# Patient Record
Sex: Male | Born: 1960 | Race: Black or African American | Hispanic: No | Marital: Single | Smoking: Current every day smoker
Health system: Southern US, Community
[De-identification: ages and names within clinical notes are randomized; demographics above are authoritative.]

## PROBLEM LIST (undated history)

## (undated) DIAGNOSIS — R197 Diarrhea, unspecified: Secondary | ICD-10-CM

## (undated) DIAGNOSIS — D3613 Benign neoplasm of peripheral nerves and autonomic nervous system of lower limb, including hip: Secondary | ICD-10-CM

## (undated) DIAGNOSIS — M5030 Other cervical disc degeneration, unspecified cervical region: Secondary | ICD-10-CM

## (undated) DIAGNOSIS — G4730 Sleep apnea, unspecified: Secondary | ICD-10-CM

## (undated) DIAGNOSIS — Z9289 Personal history of other medical treatment: Secondary | ICD-10-CM

## (undated) DIAGNOSIS — M722 Plantar fascial fibromatosis: Secondary | ICD-10-CM

## (undated) DIAGNOSIS — I10 Essential (primary) hypertension: Secondary | ICD-10-CM

## (undated) DIAGNOSIS — M1990 Unspecified osteoarthritis, unspecified site: Secondary | ICD-10-CM

## (undated) DIAGNOSIS — J449 Chronic obstructive pulmonary disease, unspecified: Secondary | ICD-10-CM

## (undated) DIAGNOSIS — M7732 Calcaneal spur, left foot: Secondary | ICD-10-CM

## (undated) DIAGNOSIS — I429 Cardiomyopathy, unspecified: Secondary | ICD-10-CM

## (undated) DIAGNOSIS — Z9189 Other specified personal risk factors, not elsewhere classified: Secondary | ICD-10-CM

## (undated) HISTORY — PX: CARDIAC CATHETERIZATION: SHX172

## (undated) HISTORY — PX: HIP SURGERY: SHX245

## (undated) HISTORY — PX: KNEE SURGERY: SHX244

---
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
## 1997-11-16 ENCOUNTER — Emergency Department (HOSPITAL_COMMUNITY): Admission: EM | Admit: 1997-11-16 | Discharge: 1997-11-16 | Payer: Self-pay | Admitting: Emergency Medicine

## 1998-10-12 ENCOUNTER — Emergency Department (HOSPITAL_COMMUNITY): Admission: EM | Admit: 1998-10-12 | Discharge: 1998-10-12 | Payer: Self-pay | Admitting: Emergency Medicine

## 1998-10-12 ENCOUNTER — Encounter: Payer: Self-pay | Admitting: Emergency Medicine

## 2003-02-09 ENCOUNTER — Other Ambulatory Visit: Payer: Self-pay

## 2007-03-27 DIAGNOSIS — I429 Cardiomyopathy, unspecified: Secondary | ICD-10-CM

## 2007-03-27 HISTORY — DX: Cardiomyopathy, unspecified: I42.9

## 2007-12-08 ENCOUNTER — Inpatient Hospital Stay (HOSPITAL_COMMUNITY): Admission: EM | Admit: 2007-12-08 | Discharge: 2007-12-11 | Payer: Self-pay | Admitting: Emergency Medicine

## 2007-12-12 ENCOUNTER — Encounter: Admission: RE | Admit: 2007-12-12 | Discharge: 2007-12-12 | Payer: Self-pay | Admitting: Internal Medicine

## 2010-08-08 NOTE — Cardiovascular Report (Signed)
NAME:  [name], [name]          ACCOUNT NO.:  [id number]   MEDICAL RECORD NO.:  [id number]         PATIENT TYPE:  HREC   LOCATION:                                 FACILITY:   PHYSICIAN:  Thereasa Solo. Little, M.D. DATE OF BIRTH:  [birth year]   DATE OF PROCEDURE:  12/10/2007  DATE OF DISCHARGE:                            CARDIAC CATHETERIZATION   INDICATIONS FOR TEST:  This 50 year old male has complained of chest  discomfort and increasing dyspnea on exertion.  He apparently had a cath  in [place] and was told he may need a stent.   He is brought to the cath lab for definitive evaluation of his coronary  anatomy.   After obtaining informed consent, the patient was prepped and draped in  the usual sterile fashion exposing the right groin.  Following the local  anesthetic with 1% Xylocaine, the Seldinger technique was employed and a  5-French introducer sheath was placed in the right femoral artery.  Left  and right coronary arteriography and ventriculography in the RAO  projection was performed.   MEDICATIONS:  Two mg of IV Versed.   COMPLICATIONS:  None.   TOTAL CONTRAST USED:  Seventy mL.   EQUIPMENTS:  A 5-French Judkins configuration catheters.   RESULTS:  Hemodynamic monitoring:  Central aortic pressure was 116/62.  Left ventricular pressure was 117/5 with no aortic valve gradient at the  time of pullback.  Of note, his heart rate was sinus bradycardia with a  rate of 50.   Ventriculography:  Ventriculography in the RAO projection revealed mild  global hypokinesis.  The inferior wall seemed to be slightly more  hypokinetic than the other wall segments.  His left ventricular end-  diastolic pressure was 11.  There was no mitral regurgitation.  An  ejection fraction was 35-40%.  I could not calculate the ejection  fraction because the server is down; however, it will be calculated at a  later date.   Coronary arteriography:   On fluoroscopy, no calcification was  seen.  1. Left main:  Normal and bifurcated.  2. Circumflex:  The circumflex was free of disease.  It gave rise to 2      OM vessels that were free of disease.  3. LAD:  The LAD extended down to the apex of the heart and the distal      third of the LAD was small in comparison to the other vessels, but      there was no evidence of occlusive disease.  There were 2 diagonals      that came off relatively proximally, and they were also free of      disease.  4. Right coronary artery:  Normal.  There was a moderate PDA and 2      small posterolateral vessels that were free of disease.   CONCLUSION:  1. No evidence of coronary disease.  2. Mild-to-moderate LV dysfunction with an ejection fraction of 35-      40%.   I cannot explain the etiology of the decreased EF.  His only potential  factor would be alcohol intake which I have discussed in  detail with  him.  He will need to be placed on an ACE, and he is already on low-dose  beta blockers (Lopressor 25 mg b.i.d.).  He will need to watch for  worsening bradycardia should the beta blockers be increased, and I did  take the liberty of starting him on lisinopril 5 mg b.i.d.  His blood  pressure is relatively low, but hopefully on a split dose of lisinopril,  he will be able to tolerate this.   I plan to check a BMP in the morning.           ______________________________  Thereasa Solo. Little, M.D.     ABL/MEDQ  D:  12/10/2007  T:  12/10/2007  Job:  [id number]   cc:   Nicki Guadalajara, M.D.  [facility] [employer]

## 2010-08-08 NOTE — Discharge Summary (Signed)
[name], [name]          ACCOUNT NO.:  [id number]   MEDICAL RECORD NO.:  [id number]          PATIENT TYPE:  INP   LOCATION:  3741                         FACILITY:  [facility]   PHYSICIAN:  Hillery Aldo, M.D.   DATE OF BIRTH:  [birth year]   DATE OF ADMISSION:  12/08/2007  DATE OF DISCHARGE:  12/11/2007                               DISCHARGE SUMMARY   PRIMARY CARE PHYSICIAN:  Unassigned.   DISCHARGE DIAGNOSES:  1. Noncardiac chest pain.  2. Cardiomyopathy with an ejection fraction of 35-40%.  3. Left arm paresthesias secondary to cervical spinal stenosis and      degenerative disk disease.  4. Ongoing tobacco abuse.  5. Hypertension.  6. Osteoarthritis.  7. Right breast mass.   DISCHARGE MEDICATIONS:  1. Aspirin 81 mg daily.  2. Lopressor 25 mg b.i.d.  3. Lisinopril 5 mg b.i.d.  4. Vicodin 5/500 one to two tablets q.6 h. p.r.n. pain.   CONSULTATIONS:  Nicki Guadalajara, MD, Cardiology.   BRIEF ADMISSION HISTORY OF PRESENT ILLNESS:  The patient is a 50-year-  old male presented to the hospital with chief complaint of chest pain  and left arm numbness.  For full details, please see the dictated report  done by Dr. Sharon Seller.  Given the patient's risk factor profile, he was  admitted to rule out acute coronary syndrome and for further evaluation  and workup.   PROCEDURES AND DIAGNOSTIC STUDIES:  1. Chest x-ray on December 08, 2007, showed no acute abnormalities.  2. Cervical spine films on December 08, 2007, showed anterior      degenerative changes of C5-6 and C6-7 levels.  No bony canal or      foraminal stenosis noted.  Reversal of the normal cervical lordosis      was noted.  3. CT scan of the head on December 08, 2007, showed normal findings.  4. MRI of the brain on December 09, 2007, showed mild tonsillar      ectopia without definite glycopeptide-resistant enterococcus      malformation.  Small hyperdensities in the white matter      bilaterally, nonspecific, but  most likely chronic ischemia.  No      acute infarct.  5. Cardiac catheterization on December 10, 2007, showed no evidence      of coronary artery disease.  Mild-to-moderate left ventricular      dysfunction with an ejection fraction of 35-40%.  6. MRI of the cervical spine on December 10, 2007, showed      degenerative cervical spondylosis with disk disease and facet      disease.  Multilevel shallow disk protrusion, osteophytic and      uncinate spurring changes contributing to spinal and foraminal      stenosis.   DISCHARGE LABORATORY VALUES:  Sodium was 139, potassium 4.8, chloride  105, bicarb 30, BUN 11, creatinine 1.08, and glucose 89.  White blood  cell count was 5.8, hemoglobin 15.2, hematocrit 45.8, and platelets 216.   HOSPITAL COURSE BY PROBLEM:  1. Chest pain:  The patient was admitted and acute coronary syndrome      was ruled out by serial  enzyme testing.  He was monitored on      telemetry and because of his risk factor profile, a Cardiology      consult was requested and kindly provided by Dr. Tresa Endo.  The      patient did undergo cardiac catheterization with findings as noted      above.  Recommendations were for combination of ACE inhibitor and      beta-blocker therapy for his decreased EF.  The underlying etiology      of his nonischemic cardiomyopathy was not known.  We did recommend      that he discontinue any alcohol intake.  At this time, the patient      is chest pain free and has been cleared for discharge by the      cardiologist.  2. Cardiomyopathy/ejection fraction of 35-40%:  Again, the patient      does have evidence of nonischemic cardiomyopathy of undetermined      etiology.  We have recommended that he discontinue alcohol and      smoking.  He was discharged on the appropriate medications      including beta-blocker therapy and ACE inhibitor.  He has a follow      up appointment scheduled with Dr. Tresa Endo of Cardiology on January 13, 2008, at  3:45 p.m.  3. Left arm paresthesias:  The patient had persistent left arm      paresthesias and was evaluated for the possibility of stroke.  His      CVA was ruled out by MRI scanning.  MRI of the cervical spine      revealed multiple abnormalities of the cervical spine and disk.      This is likely the etiology of his significant paresthesias.  He      will likely need evaluation by a neurosurgeon for definitive      treatment if conservative therapy does not resolve his symptoms.  4. Tobacco abuse:  The patient was extensively counseled on the      importance of cessation.  5. Hypertension:  The patient was slightly hypotensive at discharge.      It was noted that he was put on nitroglycerin paste for his initial      presenting complaints.  This was discontinued and should help to      improve his blood pressure readings.  6. Osteoarthritis:  The patient was maintained on analgesics for pain      control.  7. Right breast mass:  The patient had a palpable mass approximately      1.5 cm lateral to the right nipple.  He was set up for an      outpatient evaluation at the breast center for December 12, 2007,      and we will receive a diagnostic mammogram with ultrasound and      biopsy at that time.   DISPOSITION:  The patient is medically stable for discharge and has  appropriate followup arranged.      Hillery Aldo, M.D.  Electronically Signed     CR/MEDQ  D:  12/11/2007  T:  12/12/2007  Job:  [id number]

## 2010-08-08 NOTE — H&P (Signed)
NAME:  DEMPSEY, AHONEN          ACCOUNT NO.:  [id number]   MEDICAL RECORD NO.:  [id number]          PATIENT TYPE:  EMS   LOCATION:  MAJO                         FACILITY:  [facility]   PHYSICIAN:  Lonia Blood, M.D.DATE OF BIRTH:  1960-11-04   DATE OF ADMISSION:  12/08/2007  DATE OF DISCHARGE:                              HISTORY & PHYSICAL   ADDENDUM:  Please note this is an addendum to the previously dictated  admission history and physical.  My dictation was interrupted midway for  unclear reasons.   DATA REVIEW:  CBC is unremarkable.  C-MET is unremarkable.  Cardiac  enzymes are negative x2.  Chest x-ray reveals no acute disease.  CT scan  of the head reveals no acute disease.  C spine plain film reveals  anterior degenerative change of C5 through C6 and C6-C7 levels but no  evidence of stenosis.  A 12-lead EKG is normal sinus rhythm at 85 beats  per minute and deep Q waves in V1 and V2.   PHYSICAL EXAMINATION:  VITAL SIGNS:  Temperature 98, blood pressure  160/83, heart rate 83, respiratory rate 18, O2 saturation 97% on room  air.  GENERAL:  Well-developed, well-nourished gentleman in no acute  respiratory distress.  HEENT:  Head is normocephalic, atraumatic.  Pupils are equal, round,  reactive to light and accommodation.  Extraocular muscles are intact.  OC/OP clear.  NECK:  No JVD.  LUNGS:  Clear to auscultation bilaterally without wheezes or rhonchi.  CARDIOVASCULAR:  Regular rate and rhythm without murmur, gallop or rub,  normal S1 and S2.  ABDOMEN:  Nontender, nondistended, soft, bowel sounds present, no  hepatosplenomegaly, no rebound, no ascites.  EXTREMITIES:  No significant cyanosis, clubbing or edema in bilateral  lower extremities.  NEUROLOGIC:  The patient displays 5/5 strength in bilateral upper  extremities.  He does require significant coaching, however, to use the  left upper extremity.  He does appear to have diminished sensation in  this hand.  There  is a 1+ radial pulse bilateral wrists.  He is alert  and oriented x4.  Cranial nerves II-XII are intact.  There is no  Babinski.   IMPRESSION/PLAN:  1. Chest pain:  It is unclear to me whether Mr. Kille truly has      a history of coronary disease or not.  He does smoke and has      uncontrolled hypertension.  I would suspect that he has      hyperlipidemia as well.  He does not appear to have a positive      history of coronary artery disease, and there is no evidence of      occult diabetes.  We will admit the patient to rule him out for      acute myocardial infarction.  I am not convinced that he has had an      acute myocardial infarction, but I am more concerned that he may,      in fact, be experiencing true angina.  After the patient has been      ruled out, we will need to consider further risk stratification,  and this patient will likely require cardiac catheterization.  We      will call cardiology when the results of our cardiac enzymes and      serial EKGs are accomplished.  2. Left arm numbness/paresthesias:  This may, in fact, simply      represent a brachial plexus-type injury.  This could also be      related to supraspinatus injury or even rotator cuff issues.      Nonetheless in this patient whom I suspect may have coronary      disease, I also must consider the possibility of cerebrovascular      disease.  CT scan has been accomplished and is unrevealing.  To put      this issue to rest, I am going to go ahead and proceed with an MRI      of the head with contrast.  If the patient has evidence of      cerebrovascular disease, a full stroke evaluation will be carried      out.  For now, the patient will be empirically placed on aspirin      for the dual indication of chest pain as well as possible      cerebrovascular accident.  He does, in fact, follow commands and      appears to be safe for oral intake on my bedside swallow      evaluation.  3. Tobacco  abuse:  I have counseled the patient as to multiple      deleterious effects of ongoing tobacco abuse.  I have advised him      to discontinue smoking immediately.  I will request tobacco      cessation consultation to further encourage this point during his      hospital stay.  4. Hypertension:  The patient has poorly controlled hypertension.  He      reports that he was previously treated with antihypertensives.  In      this current setting, I will add a beta-blocker, and we will follow      the patient's blood pressure trend.      Lonia Blood, M.D.  Electronically Signed     JTM/MEDQ  D:  12/08/2007  T:  12/08/2007  Job:  [id number]

## 2010-08-08 NOTE — H&P (Addendum)
[name], [name]          ACCOUNT NO.:  [id number]   MEDICAL RECORD NO.:  [id number]          PATIENT TYPE:  EMS   LOCATION:  MAJO                         FACILITY:  [facility]   PHYSICIAN:  Lonia Blood, M.D.DATE OF BIRTH:  29-Jul-1960   DATE OF ADMISSION:  12/08/2007  DATE OF DISCHARGE:                              HISTORY & PHYSICAL   PRIMARY CARE PHYSICIAN:  Unassigned.   CHIEF COMPLAINT:  Chest pain and left arm numbness.   HISTORY OF PRESENT ILLNESS:  Mr. [name] is a very pleasant 50-  year-old gentleman.  He provides somewhat of a circumferential history  and therefore specific details of his history are very difficult to  ascertain.  Nonetheless, the general consensus here is that the patient  has been suffering with substernal and left-sided upper chest type  pressure-type pain for at least 2 weeks now.  Indeed, at one point  during the conversation he suggested that these symptoms may go back  more than 5 years.  He interestingly enough states that he did have an  evaluation for coronary disease like 10 years ago.  He states that he  was told at that point that he did actually have coronary disease and  that stents were recommended.  The patient then apparently refused to  have these stents and left the hospital.  He has not had any cardiac  evaluation since that time.  The patient states that his pain has been  certainly worsening over the last 2 months.  It appears to be worse when  he physically exerts himself.  At times it is associated with  diaphoresis.  The reason that he actually came in today however, is that  it has also become associated with severe left arm numbness and  tingling.  This has been almost constant now for apparently 48 hours or  greater.  The patient denies loss of strength in the arm, but states  there are times thatI just cannot even feel my arm.  He also states               there are times where the arm is tingling  significantly.  He denies loss  of strength in other extremities, clumsiness, falls, change in facies,  slurred speech or difficulty swallowing.  There has been no amneurosis  fugax.  The patient does not have a regular doctor and does not receive  regular medical care.   PAST MEDICAL HISTORY:  1. Reported history of known coronary artery disease with patient      refusing PTCA/stents approximately 5-10 years ago by his report in      [name], [name].  2. Tobacco abuse in the amount of 1 pack per day beginning at age of      9.  3. Severe osteoarthritis of bilateral knees and hips status post      multiple surgical corrections to the knees and the hips per his      report.   OUTPATIENT MEDICATIONS:  None.   ALLERGIES:  No known drug allergies.   FAMILY HISTORY:  The patient's mother died of cancer from the bones.  The patient's father died of an MI but was 58 at the time that he died  of such and had no early coronary disease as best the patient knows.   SOCIAL HISTORY:  The patient is employed as a Education administrator.  He lives in  [place].  He occasionally partakes of alcohol.  He has healthy  children.  He is divorced.   DATA REVIEW:  CBC is unremarkable.  CMET is unremarkable.   Dictation ended at this point.      Lonia Blood, M.D.  Electronically Signed     JTM/MEDQ  D:  12/08/2007  T:  12/08/2007  Job:  [id number]

## 2010-12-25 LAB — BASIC METABOLIC PANEL
BUN: 11
CO2: 28
Calcium: 9.2
Chloride: 104
Chloride: 105
Creatinine, Ser: 0.89
Creatinine, Ser: 1.08
Glucose, Bld: 85
Glucose, Bld: 89
Potassium: 4.8
Sodium: 138

## 2010-12-25 LAB — LIPID PANEL
HDL: 53
Total CHOL/HDL Ratio: 2.8
Triglycerides: 85

## 2010-12-25 LAB — CBC
Hemoglobin: 15.2
MCHC: 33.1
MCV: 91.4
RBC: 5.01
RDW: 15

## 2010-12-25 LAB — PROTIME-INR: INR: 0.9

## 2010-12-25 LAB — CARDIAC PANEL(CRET KIN+CKTOT+MB+TROPI)
Total CK: 142
Troponin I: 0.01
Troponin I: <0.01

## 2010-12-27 LAB — COMPREHENSIVE METABOLIC PANEL
AST: 23
Albumin: 4.1
BUN: 9
Calcium: 9.1
Chloride: 107
Creatinine, Ser: 0.76
GFR calc Af Amer: >60
Total Bilirubin: 1
Total Protein: 6.8

## 2010-12-27 LAB — POCT I-STAT, CHEM 8
BUN: 10
Calcium, Ion: 1.16
Chloride: 108
Creatinine, Ser: 0.9
TCO2: 26

## 2010-12-27 LAB — PROTIME-INR: Prothrombin Time: 12.8

## 2010-12-27 LAB — DIFFERENTIAL
Basophils Absolute: 0
Eosinophils Relative: 2
Lymphocytes Relative: 36
Lymphs Abs: 2
Monocytes Absolute: 0.5
Monocytes Relative: 8
Neutro Abs: 2.9

## 2010-12-27 LAB — POCT CARDIAC MARKERS
CKMB, poc: <1 — ABNORMAL LOW
Myoglobin, poc: 49.9
Myoglobin, poc: 57.7
Troponin i, poc: <0.05

## 2010-12-27 LAB — TSH: TSH: 2.385

## 2010-12-27 LAB — CBC
HCT: 46.3
MCHC: 34.3
MCV: 88.9
Platelets: 246
RDW: 15.1
WBC: 5.5

## 2010-12-27 LAB — APTT: aPTT: 35

## 2010-12-27 LAB — HEMOGLOBIN A1C: Mean Plasma Glucose: 105

## 2010-12-27 LAB — FOLATE: Folate: 17.9

## 2010-12-27 LAB — TROPONIN I: Troponin I: <0.01

## 2010-12-27 LAB — CK TOTAL AND CKMB (NOT AT ARMC): Total CK: 174

## 2013-03-26 HISTORY — PX: ACHILLES TENDON REPAIR: SUR1153

## 2013-04-14 ENCOUNTER — Emergency Department (HOSPITAL_COMMUNITY): Payer: Self-pay

## 2013-04-14 ENCOUNTER — Inpatient Hospital Stay (HOSPITAL_COMMUNITY)
Admission: EM | Admit: 2013-04-14 | Discharge: 2013-04-16 | DRG: 071 | Disposition: A | Discharge status: Home / Self Care | Payer: Self-pay | Attending: Internal Medicine | Admitting: Internal Medicine

## 2013-04-14 ENCOUNTER — Encounter (HOSPITAL_COMMUNITY): Payer: Self-pay | Admitting: Emergency Medicine

## 2013-04-14 DIAGNOSIS — I10 Essential (primary) hypertension: Secondary | ICD-10-CM | POA: Diagnosis present

## 2013-04-14 DIAGNOSIS — G9340 Encephalopathy, unspecified: Principal | ICD-10-CM | POA: Diagnosis present

## 2013-04-14 DIAGNOSIS — F17210 Nicotine dependence, cigarettes, uncomplicated: Secondary | ICD-10-CM | POA: Diagnosis present

## 2013-04-14 DIAGNOSIS — R4182 Altered mental status, unspecified: Secondary | ICD-10-CM | POA: Diagnosis present

## 2013-04-14 DIAGNOSIS — Z8249 Family history of ischemic heart disease and other diseases of the circulatory system: Secondary | ICD-10-CM

## 2013-04-14 DIAGNOSIS — R479 Unspecified speech disturbances: Secondary | ICD-10-CM | POA: Diagnosis present

## 2013-04-14 DIAGNOSIS — Z91199 Patient's noncompliance with other medical treatment and regimen due to unspecified reason: Secondary | ICD-10-CM

## 2013-04-14 DIAGNOSIS — Z9119 Patient's noncompliance with other medical treatment and regimen: Secondary | ICD-10-CM

## 2013-04-14 DIAGNOSIS — R51 Headache: Secondary | ICD-10-CM | POA: Diagnosis present

## 2013-04-14 DIAGNOSIS — I428 Other cardiomyopathies: Secondary | ICD-10-CM | POA: Diagnosis present

## 2013-04-14 DIAGNOSIS — Z808 Family history of malignant neoplasm of other organs or systems: Secondary | ICD-10-CM

## 2013-04-14 DIAGNOSIS — F17200 Nicotine dependence, unspecified, uncomplicated: Secondary | ICD-10-CM | POA: Diagnosis present

## 2013-04-14 DIAGNOSIS — R519 Headache, unspecified: Secondary | ICD-10-CM | POA: Diagnosis present

## 2013-04-14 DIAGNOSIS — Z720 Tobacco use: Secondary | ICD-10-CM

## 2013-04-14 DIAGNOSIS — I252 Old myocardial infarction: Secondary | ICD-10-CM

## 2013-04-14 HISTORY — DX: Essential (primary) hypertension: I10

## 2013-04-14 LAB — DIFFERENTIAL
Basophils Absolute: 0 10*3/uL (ref 0.0–0.1)
Basophils Relative: 0 % (ref 0–1)
EOS ABS: 0.1 10*3/uL (ref 0.0–0.7)
Eosinophils Relative: 2 % (ref 0–5)
LYMPHS ABS: 3.5 10*3/uL (ref 0.7–4.0)
LYMPHS PCT: 46 % (ref 12–46)
MONOS PCT: 8 % (ref 3–12)
Monocytes Absolute: 0.6 10*3/uL (ref 0.1–1.0)
NEUTROS ABS: 3.3 10*3/uL (ref 1.7–7.7)
NEUTROS PCT: 44 % (ref 43–77)

## 2013-04-14 LAB — CBC
HCT: 44.2 % (ref 39.0–52.0)
HEMOGLOBIN: 15.4 g/dL (ref 13.0–17.0)
MCH: 31 pg (ref 26.0–34.0)
MCHC: 34.8 g/dL (ref 30.0–36.0)
MCV: 88.9 fL (ref 78.0–100.0)
PLATELETS: 210 10*3/uL (ref 150–400)
RBC: 4.97 MIL/uL (ref 4.22–5.81)
RDW: 14.3 % (ref 11.5–15.5)
WBC: 7.5 10*3/uL (ref 4.0–10.5)

## 2013-04-14 LAB — POCT I-STAT TROPONIN I: Troponin i, poc: 0.01 ng/mL (ref 0.00–0.08)

## 2013-04-14 LAB — GLUCOSE, CAPILLARY: GLUCOSE-CAPILLARY: 90 mg/dL (ref 70–99)

## 2013-04-14 NOTE — ED Notes (Addendum)
Presents with onset of right sided headache, confusion and inability to follow commands, bilateral wheezes. Began at 4 pm today. Family called EMS due to confusion. Pt with biliateral expiratory wheezes, confusion. Slow to respond.

## 2013-04-15 ENCOUNTER — Encounter (HOSPITAL_COMMUNITY): Payer: Self-pay | Admitting: Internal Medicine

## 2013-04-15 ENCOUNTER — Inpatient Hospital Stay (HOSPITAL_COMMUNITY): Payer: Self-pay

## 2013-04-15 DIAGNOSIS — R519 Headache, unspecified: Secondary | ICD-10-CM | POA: Diagnosis present

## 2013-04-15 DIAGNOSIS — R4182 Altered mental status, unspecified: Secondary | ICD-10-CM | POA: Insufficient documentation

## 2013-04-15 DIAGNOSIS — I10 Essential (primary) hypertension: Secondary | ICD-10-CM | POA: Diagnosis present

## 2013-04-15 DIAGNOSIS — R4789 Other speech disturbances: Secondary | ICD-10-CM

## 2013-04-15 DIAGNOSIS — F17210 Nicotine dependence, cigarettes, uncomplicated: Secondary | ICD-10-CM | POA: Diagnosis present

## 2013-04-15 DIAGNOSIS — G9340 Encephalopathy, unspecified: Secondary | ICD-10-CM | POA: Diagnosis present

## 2013-04-15 DIAGNOSIS — I517 Cardiomegaly: Secondary | ICD-10-CM

## 2013-04-15 DIAGNOSIS — R51 Headache: Secondary | ICD-10-CM | POA: Diagnosis present

## 2013-04-15 DIAGNOSIS — R479 Unspecified speech disturbances: Secondary | ICD-10-CM | POA: Diagnosis present

## 2013-04-15 LAB — CBC WITH DIFFERENTIAL/PLATELET
BASOS ABS: 0 10*3/uL (ref 0.0–0.1)
Basophils Relative: 1 % (ref 0–1)
EOS ABS: 0.2 10*3/uL (ref 0.0–0.7)
Eosinophils Relative: 4 % (ref 0–5)
HCT: 45.1 % (ref 39.0–52.0)
Hemoglobin: 14.9 g/dL (ref 13.0–17.0)
Lymphocytes Relative: 43 % (ref 12–46)
Lymphs Abs: 2.3 10*3/uL (ref 0.7–4.0)
MCH: 29.8 pg (ref 26.0–34.0)
MCHC: 33 g/dL (ref 30.0–36.0)
MCV: 90.2 fL (ref 78.0–100.0)
Monocytes Absolute: 0.5 10*3/uL (ref 0.1–1.0)
Monocytes Relative: 9 % (ref 3–12)
NEUTROS PCT: 44 % (ref 43–77)
Neutro Abs: 2.4 10*3/uL (ref 1.7–7.7)
PLATELETS: 201 10*3/uL (ref 150–400)
RBC: 5 MIL/uL (ref 4.22–5.81)
RDW: 14.5 % (ref 11.5–15.5)
WBC: 5.5 10*3/uL (ref 4.0–10.5)

## 2013-04-15 LAB — COMPREHENSIVE METABOLIC PANEL
ALK PHOS: 69 U/L (ref 39–117)
ALT: 17 U/L (ref 0–53)
ALT: 18 U/L (ref 0–53)
AST: 15 U/L (ref 0–37)
AST: 15 U/L (ref 0–37)
Albumin: 3.5 g/dL (ref 3.5–5.2)
Albumin: 3.8 g/dL (ref 3.5–5.2)
Alkaline Phosphatase: 61 U/L (ref 39–117)
BILIRUBIN TOTAL: 1.1 mg/dL (ref 0.3–1.2)
BILIRUBIN TOTAL: 0.8 mg/dL (ref 0.3–1.2)
BUN: 9 mg/dL (ref 6–23)
BUN: 8 mg/dL (ref 6–23)
CHLORIDE: 105 meq/L (ref 96–112)
CHLORIDE: 106 meq/L (ref 96–112)
CO2: 21 mEq/L (ref 19–32)
CO2: 24 meq/L (ref 19–32)
Calcium: 8.5 mg/dL (ref 8.4–10.5)
Calcium: 9 mg/dL (ref 8.4–10.5)
Creatinine, Ser: 0.86 mg/dL (ref 0.50–1.35)
Creatinine, Ser: 0.87 mg/dL (ref 0.50–1.35)
GFR calc non Af Amer: >90 mL/min (ref >90)
GLUCOSE: 106 mg/dL — AB (ref 70–99)
GLUCOSE: 84 mg/dL (ref 70–99)
POTASSIUM: 3.9 meq/L (ref 3.7–5.3)
Potassium: 4.3 mEq/L (ref 3.7–5.3)
SODIUM: 142 meq/L (ref 137–147)
Sodium: 142 mEq/L (ref 137–147)
TOTAL PROTEIN: 7 g/dL (ref 6.0–8.3)
Total Protein: 6.5 g/dL (ref 6.0–8.3)

## 2013-04-15 LAB — RAPID URINE DRUG SCREEN, HOSP PERFORMED
AMPHETAMINES: NOT DETECTED
BARBITURATES: POSITIVE — AB
BENZODIAZEPINES: NOT DETECTED
Cocaine: NOT DETECTED
OPIATES: POSITIVE — AB
TETRAHYDROCANNABINOL: NOT DETECTED

## 2013-04-15 LAB — HEMOGLOBIN A1C
Hgb A1c MFr Bld: 5.6 % (ref <5.7)
Mean Plasma Glucose: 114 mg/dL (ref <117)

## 2013-04-15 LAB — PRO B NATRIURETIC PEPTIDE: Pro B Natriuretic peptide (BNP): 11.1 pg/mL (ref 0–125)

## 2013-04-15 LAB — PROTIME-INR
INR: 0.92 (ref 0.00–1.49)
PROTHROMBIN TIME: 12.2 s (ref 11.6–15.2)

## 2013-04-15 LAB — TSH: TSH: 1.767 u[IU]/mL (ref 0.350–4.500)

## 2013-04-15 LAB — TROPONIN I: Troponin I: <0.3 ng/mL (ref <0.30)

## 2013-04-15 LAB — SEDIMENTATION RATE: SED RATE: 6 mm/h (ref 0–16)

## 2013-04-15 MED ORDER — ASPIRIN EC 325 MG PO TBEC
325.0000 mg | DELAYED_RELEASE_TABLET | Freq: Every day | ORAL | Status: DC
  Administered 2013-04-15 – 2013-04-16 (×2): 325 mg via ORAL
  Filled 2013-04-15 (×2): qty 1

## 2013-04-15 MED ORDER — ACETAMINOPHEN 650 MG RE SUPP: 650.0000 mg | RECTAL | Status: DC | PRN

## 2013-04-15 MED ORDER — SODIUM CHLORIDE 0.9 % IV SOLN
INTRAVENOUS | Status: AC
  Administered 2013-04-15: 19:00:00 via INTRAVENOUS

## 2013-04-15 MED ORDER — GADOBENATE DIMEGLUMINE 529 MG/ML IV SOLN
20.0000 mL | Freq: Once | INTRAVENOUS | Status: AC
  Administered 2013-04-15: 20 mL via INTRAVENOUS

## 2013-04-15 MED ORDER — KETOROLAC TROMETHAMINE 30 MG/ML IJ SOLN
30.0000 mg | Freq: Once | INTRAMUSCULAR | Status: AC
  Administered 2013-04-15: 30 mg via INTRAVENOUS
  Filled 2013-04-15: qty 1

## 2013-04-15 MED ORDER — IPRATROPIUM-ALBUTEROL 0.5-2.5 (3) MG/3ML IN SOLN
6.0000 mL | Freq: Once | RESPIRATORY_TRACT | Status: AC
  Administered 2013-04-15: 6 mL via RESPIRATORY_TRACT
  Filled 2013-04-15: qty 6

## 2013-04-15 MED ORDER — ALBUTEROL SULFATE (2.5 MG/3ML) 0.083% IN NEBU: 2.5000 mg | INHALATION_SOLUTION | RESPIRATORY_TRACT | Status: DC | PRN

## 2013-04-15 MED ORDER — MORPHINE SULFATE 2 MG/ML IJ SOLN
1.0000 mg | INTRAMUSCULAR | Status: DC | PRN
  Administered 2013-04-15 (×3): 1 mg via INTRAVENOUS
  Filled 2013-04-15 (×3): qty 1

## 2013-04-15 MED ORDER — VITAMIN B-1 100 MG PO TABS
100.0000 mg | ORAL_TABLET | Freq: Every day | ORAL | Status: DC
  Administered 2013-04-15 – 2013-04-16 (×2): 100 mg via ORAL
  Filled 2013-04-15 (×2): qty 1

## 2013-04-15 MED ORDER — SODIUM CHLORIDE 0.9 % IV SOLN
INTRAVENOUS | Status: AC
  Administered 2013-04-15: 02:00:00 via INTRAVENOUS

## 2013-04-15 MED ORDER — ONDANSETRON HCL 4 MG/2ML IJ SOLN: 4.0000 mg | Freq: Three times a day (TID) | INTRAMUSCULAR | Status: DC | PRN

## 2013-04-15 MED ORDER — METOCLOPRAMIDE HCL 5 MG/ML IJ SOLN
10.0000 mg | Freq: Once | INTRAMUSCULAR | Status: AC
  Administered 2013-04-15: 10 mg via INTRAVENOUS
  Filled 2013-04-15: qty 2

## 2013-04-15 MED ORDER — SENNOSIDES-DOCUSATE SODIUM 8.6-50 MG PO TABS: 1.0000 | ORAL_TABLET | Freq: Every evening | ORAL | Status: DC | PRN

## 2013-04-15 MED ORDER — ACETAMINOPHEN 325 MG PO TABS
650.0000 mg | ORAL_TABLET | ORAL | Status: DC | PRN
  Administered 2013-04-15: 650 mg via ORAL
  Filled 2013-04-15: qty 2

## 2013-04-15 MED ORDER — DIPHENHYDRAMINE HCL 50 MG/ML IJ SOLN
25.0000 mg | Freq: Once | INTRAMUSCULAR | Status: AC
  Administered 2013-04-15: 25 mg via INTRAVENOUS
  Filled 2013-04-15: qty 1

## 2013-04-15 NOTE — Progress Notes (Signed)
*  PRELIMINARY RESULTS* Vascular Ultrasound Carotid Duplex (Doppler) has been completed.  Preliminary findings: Bilateral:  1-39% ICA stenosis.  Vertebral artery flow is antegrade.     Landry Mellow, RDMS, RVT  04/15/2013, 11:18 AM

## 2013-04-15 NOTE — Progress Notes (Signed)
Patient admitted early this AM by Dr. Raliegh Ip- please see H&P.  Await MRI/MRA  Eulogio Bear

## 2013-04-15 NOTE — Progress Notes (Signed)
  Echocardiogram 2D Echocardiogram has been performed.  [name] 04/15/2013, 10:53 AM

## 2013-04-15 NOTE — Consult Note (Signed)
NEURO HOSPITALIST CONSULT NOTE    Reason for Consult: HA, confusion.  HPI:                                                                                                                                          [name] is an 53 y.o. male, right handed, with uncontrolled HTN, non compliant with meds, MI hx per report, presents with AMS and HA. He said that the usually doesn't get HA but 3 or 4 weeks ago he started experiencing off and on HA that has been present ever since and got worse yesterday. [name] said that he was evaluated in the ED in [place] because he had a severe HA that made him to passed out. It looks like at that time he signed AMA and went home. He indicated that the HA is very intense, sharp, localizes mainly in the right side, associated with nausea and vomiting x 1 few days ago, but no focal weakness-numbness, slurred speech, language or vision impairment. Takes goody powers without HA relief. Today, he was reportedly confused and wife called EMS. Denies fever, infection, head or neck trauma. CT brain performed today showed " patchy at least 1.5 cm left cerebellar hypodensity. In addition, subtle hypodensity within the left corpus callosum".  Past Medical History  Diagnosis Date  . Hypertension     Past Surgical History  Procedure Laterality Date  . Hip surgery    . Knee surgery    . Cardiac catheterization      Family History  Problem Relation Age of Onset  . CAD Father   . Bone cancer Mother     Social History:  reports that he has been smoking.  He has never used smokeless tobacco. He reports that he drinks alcohol. He reports that he does not use illicit drugs.  No Known Allergies  MEDICATIONS:                                                                                                                     I have reviewed the patient's current medications.   ROS:  History obtained from the patient and chart review.  General ROS: negative for - chills, fatigue, fever, night sweats, weight gain or weight loss Psychological ROS: negative for - behavioral disorder, hallucinations, memory difficulties, mood swings or suicidal ideation Ophthalmic ROS: negative for - blurry vision, double vision, eye pain or loss of vision ENT ROS: negative for - epistaxis, nasal discharge, oral lesions, sore throat, tinnitus or vertigo Allergy and Immunology ROS: negative for - hives or itchy/watery eyes Hematological and Lymphatic ROS: negative for - bleeding problems, bruising or swollen lymph nodes Endocrine ROS: negative for - galactorrhea, hair pattern changes, polydipsia/polyuria or temperature intolerance Respiratory ROS: negative for - cough, hemoptysis, shortness of breath or wheezing Cardiovascular ROS: negative for - chest pain, dyspnea on exertion, edema or irregular heartbeat Gastrointestinal ROS: negative for - abdominal pain, diarrhea, hematemesis, or stool incontinence Genito-Urinary ROS: negative for - dysuria, hematuria, incontinence or urinary frequency/urgency Musculoskeletal ROS: negative for - joint swelling or muscular weakness Neurological ROS: as noted in HPI Dermatological ROS: negative for rash and skin lesion changes  Physical exam: pleasant male in no apparent distress. Blood pressure 141/84, pulse 61, temperature 97.9 F (36.6 C), temperature source Oral, resp. rate 18, height 6' 2"  (1.88 m), weight 120.3 kg (265 lb 3.4 oz), SpO2 99.00%. Head: normocephalic. Neck: supple, no bruits, no JVD. Cardiac: no murmurs. Lungs: clear. Abdomen: soft, no tender, no mass. Extremities: no edema. CV: pulses palpable throughout  Neurologic Examination:                                                                                                      Mental  Status: Alert, oriented, thought content appropriate.  Speech fluent without evidence of aphasia.  Able to follow 3 step commands without difficulty. Cranial Nerves: II: Discs flat bilaterally; Visual fields grossly normal, pupils equal, round, reactive to light and accommodation III,IV, VI: ptosis not present, extra-ocular motions intact bilaterally V,VII: smile symmetric, facial light touch sensation normal bilaterally VIII: hearing normal bilaterally IX,X: gag reflex present XI: bilateral shoulder shrug XII: midline tongue extension without atrophy or fasciculations Motor: Right : Upper extremity   5/5    Left:     Upper extremity   5/5  Lower extremity   5/5     Lower extremity   5/5 Tone and bulk:normal tone throughout; no atrophy noted Sensory: Pinprick and light touch intact throughout, bilaterally Deep Tendon Reflexes:  Right: Upper Extremity   Left: Upper extremity   biceps (C-5 to C-6) 2/4   biceps (C-5 to C-6) 2/4 tricep (C7) 2/4    triceps (C7) 2/4 Brachioradialis (C6) 2/4  Brachioradialis (C6) 2/4  Lower Extremity Lower Extremity  quadriceps (L-2 to L-4) 2/4   quadriceps (L-2 to L-4) 2/4 Achilles (S1) 2/4   Achilles (S1) 2/4  Plantars: Right: downgoing   Left: downgoing Cerebellar: normal finger-to-nose,  normal heel-to-shin test Gait:  No tested. No meningeal signs.    Lab Results  Component Value Date/Time   CHOL  Value: 146        ATP III CLASSIFICATION:  <200  mg/dL   Desirable  200-239  mg/dL   Borderline High  >=240    mg/dL   High 12/09/2007  4:23 AM    Results for orders placed during the hospital encounter of 04/14/13 (from the past 48 hour(s))  PROTIME-INR     Status: None   Collection Time    04/14/13 11:42 PM      Result Value Range   Prothrombin Time 12.2  11.6 - 15.2 seconds   INR 0.92  0.00 - 1.49  CBC     Status: None   Collection Time    04/14/13 11:42 PM      Result Value Range   WBC 7.5  4.0 - 10.5 K/uL   RBC 4.97  4.22 - 5.81  MIL/uL   Hemoglobin 15.4  13.0 - 17.0 g/dL   HCT 44.2  39.0 - 52.0 %   MCV 88.9  78.0 - 100.0 fL   MCH 31.0  26.0 - 34.0 pg   MCHC 34.8  30.0 - 36.0 g/dL   RDW 14.3  11.5 - 15.5 %   Platelets 210  150 - 400 K/uL  DIFFERENTIAL     Status: None   Collection Time    04/14/13 11:42 PM      Result Value Range   Neutrophils Relative % 44  43 - 77 %   Neutro Abs 3.3  1.7 - 7.7 K/uL   Lymphocytes Relative 46  12 - 46 %   Lymphs Abs 3.5  0.7 - 4.0 K/uL   Monocytes Relative 8  3 - 12 %   Monocytes Absolute 0.6  0.1 - 1.0 K/uL   Eosinophils Relative 2  0 - 5 %   Eosinophils Absolute 0.1  0.0 - 0.7 K/uL   Basophils Relative 0  0 - 1 %   Basophils Absolute 0.0  0.0 - 0.1 K/uL  COMPREHENSIVE METABOLIC PANEL     Status: None   Collection Time    04/14/13 11:42 PM      Result Value Range   Sodium 142  137 - 147 mEq/L   Potassium 3.9  3.7 - 5.3 mEq/L   Chloride 106  96 - 112 mEq/L   CO2 21  19 - 32 mEq/L   Glucose, Bld 84  70 - 99 mg/dL   BUN 8  6 - 23 mg/dL   Creatinine, Ser 0.86  0.50 - 1.35 mg/dL   Calcium 9.0  8.4 - 10.5 mg/dL   Total Protein 7.0  6.0 - 8.3 g/dL   Albumin 3.8  3.5 - 5.2 g/dL   AST 15  0 - 37 U/L   ALT 18  0 - 53 U/L   Alkaline Phosphatase 69  39 - 117 U/L   Total Bilirubin 0.8  0.3 - 1.2 mg/dL   GFR calc non Af Amer >90  >90 mL/min   GFR calc Af Amer >90  >90 mL/min   Comment: (NOTE)     The eGFR has been calculated using the CKD EPI equation.     This calculation has not been validated in all clinical situations.     eGFR's persistently <90 mL/min signify possible Chronic Kidney     Disease.  TROPONIN I     Status: None   Collection Time    04/14/13 11:42 PM      Result Value Range   Troponin I <0.30  <0.30 ng/mL   Comment:  Due to the release kinetics of cTnI,     a negative result within the first hours     of the onset of symptoms does not rule out     myocardial infarction with certainty.     If myocardial infarction is still suspected,      repeat the test at appropriate intervals.  POCT I-STAT TROPONIN I     Status: None   Collection Time    04/14/13 11:45 PM      Result Value Range   Troponin i, poc 0.01  0.00 - 0.08 ng/mL   Comment 3            Comment: Due to the release kinetics of cTnI,     a negative result within the first hours     of the onset of symptoms does not rule out     myocardial infarction with certainty.     If myocardial infarction is still suspected,     repeat the test at appropriate intervals.  GLUCOSE, CAPILLARY     Status: None   Collection Time    04/14/13 11:57 PM      Result Value Range   Glucose-Capillary 90  70 - 99 mg/dL    Ct Head (brain) Wo Contrast  04/14/2013   CLINICAL DATA:  Right-sided headache, confusion, and ability to follow commands.  EXAM: CT HEAD WITHOUT CONTRAST  TECHNIQUE: Contiguous axial images were obtained from the base of the skull through the vertex without intravenous contrast.  COMPARISON:  CT of head December 08, 2007. The ventricles and sulci remain normal for patient's age. Cerebellar tonsils after not below the foramen magnum. Patchy at least 1.5 cm left cerebellar hypodensity. In addition, subtle hypodensity within the left corpus callosum. Image 20/31. No acute large vascular territory infarct. No intraparenchymal hemorrhage, mass effect or midline shift.  No abnormal extra-axial fluid collections. No skull fracture. Poor dentition. No paranasal sinus air-fluid levels. Mastoid air cells are well-aerated. No skull fracture. Remote bilateral minimally depressed nasal bone fractures. Ocular globes and orbital contents are nonsuspicious though not tailored for evaluation.  FINDINGS: The ventricles and sulci remain normal for patient's age. Cerebellar tonsils after not below the foramen magnum. Patchy at least 1.5 cm left cerebellar hypodensity. In addition, subtle hypodensity within the left corpus callosum, image 20/31. No acute large vascular territory infarct. No  intraparenchymal hemorrhage, mass effect or midline shift.  No abnormal extra-axial fluid collections. No skull fracture. Poor dentition. No paranasal sinus air-fluid levels. Mastoid air cells are well-aerated. No skull fracture. Remote bilateral minimally depressed nasal bone fractures. Ocular globes and orbital contents are nonsuspicious though not tailored for evaluation.  IMPRESSION: Patchy left corpus callosum and left cerebellar hypodensities, new from prior examination, though are age indeterminate. If clinical concern for acute ischemia, consider MRI of the brain with diffusion-weighted sequences.   Electronically Signed   By: Elon Alas   On: 04/14/2013 23:43   Assessment/Plan: 53 y/o with daily, intermittent HA for the past 3-4 weeks, and with reported confusion today. At this moment his mengtal status is intact and has no lateralizing neurological signs. CT brain showed " patchy at least 1.5 cm left cerebellar hypodensity. In addition, subtle hypodensity within the left corpus callosum". Unusual clinical presentation and  location of CT abnormalities to suggest a cerebrovascular ischemic insult. Recommend: MRI/MRA brain (will add contrast to MRI). Will follow up.   Dorian Pod, MD 04/15/2013, 3:27 AM

## 2013-04-15 NOTE — ED Notes (Signed)
Admitting physician at bedside

## 2013-04-15 NOTE — ED Provider Notes (Signed)
CSN: [id number]     Arrival date & time 04/14/13  2301 History   First MD Initiated Contact with Patient 04/14/13 2302     Chief Complaint  Patient presents with  . Headache   (Consider location/radiation/quality/duration/timing/severity/associated sxs/prior Treatment) HPI Comments: 53 yo male with uncontrolled HTN, non compliant with meds, MI hx per report presents with AMS and HA. Pt has had recurrent HA for weeks, left an outside hospital AMA.  Last seen normal/ conversant at 4 pm today. No known stroke or bleeding hx.  Difficulty getting history due to presentation, pt very slow to respond.  No fevers per report. EMS obtained hx from wife.  No head injury known.  No drugs at scene or hx of.    Patient is a 53 y.o. male presenting with headaches. The history is provided by the patient.  Headache   Past Medical History  Diagnosis Date  . Hypertension    No past surgical history on file. No family history on file. History  Substance Use Topics  . Smoking status: Not on file  . Smokeless tobacco: Not on file  . Alcohol Use: Not on file    Review of Systems  Unable to perform ROS Neurological: Positive for headaches.    Allergies  Review of patient's allergies indicates no known allergies.  Home Medications  No current outpatient prescriptions on file. BP 145/94  Pulse 71  Temp(Src) 98.5 F (36.9 C) (Oral)  Resp 11  SpO2 96% Physical Exam  Nursing note and vitals reviewed. Constitutional: He appears well-developed and well-nourished.  HENT:  Head: Normocephalic and atraumatic.  Dry mm  Eyes: Conjunctivae are normal. Right eye exhibits no discharge. Left eye exhibits no discharge.  Neck: Normal range of motion. Neck supple. No tracheal deviation present.  Cardiovascular: Normal rate and regular rhythm.   Pulmonary/Chest: Effort normal. He has wheezes (exp bilateral).  Abdominal: Soft. He exhibits no distension. There is no tenderness. There is no guarding.   Musculoskeletal: He exhibits no edema.  Neurological: He is alert. GCS eye subscore is 4. GCS verbal subscore is 4. GCS motor subscore is 5.  With assistance pt moves all ext equal with 4 + strength.  Loud verbal pt occasionally will answer a question with yes or now.  Knows name and DOB, not location.  Neck supple, full rom PERRL, horiz EOMFI.  Difficult exam  Skin: Skin is warm. No rash noted.    ED Course  Procedures (including critical care time) Labs Review Labs Reviewed  PROTIME-INR  CBC  DIFFERENTIAL  COMPREHENSIVE METABOLIC PANEL  TROPONIN I  GLUCOSE, CAPILLARY  POCT I-STAT TROPONIN I   Imaging Review Ct Head (brain) Wo Contrast  04/14/2013   CLINICAL DATA:  Right-sided headache, confusion, and ability to follow commands.  EXAM: CT HEAD WITHOUT CONTRAST  TECHNIQUE: Contiguous axial images were obtained from the base of the skull through the vertex without intravenous contrast.  COMPARISON:  CT of head December 08, 2007. The ventricles and sulci remain normal for patient's age. Cerebellar tonsils after not below the foramen magnum. Patchy at least 1.5 cm left cerebellar hypodensity. In addition, subtle hypodensity within the left corpus callosum. Image 20/31. No acute large vascular territory infarct. No intraparenchymal hemorrhage, mass effect or midline shift.  No abnormal extra-axial fluid collections. No skull fracture. Poor dentition. No paranasal sinus air-fluid levels. Mastoid air cells are well-aerated. No skull fracture. Remote bilateral minimally depressed nasal bone fractures. Ocular globes and orbital contents are nonsuspicious though not tailored  for evaluation.  FINDINGS: The ventricles and sulci remain normal for patient's age. Cerebellar tonsils after not below the foramen magnum. Patchy at least 1.5 cm left cerebellar hypodensity. In addition, subtle hypodensity within the left corpus callosum, image 20/31. No acute large vascular territory infarct. No intraparenchymal  hemorrhage, mass effect or midline shift.  No abnormal extra-axial fluid collections. No skull fracture. Poor dentition. No paranasal sinus air-fluid levels. Mastoid air cells are well-aerated. No skull fracture. Remote bilateral minimally depressed nasal bone fractures. Ocular globes and orbital contents are nonsuspicious though not tailored for evaluation.  IMPRESSION: Patchy left corpus callosum and left cerebellar hypodensities, new from prior examination, though are age indeterminate. If clinical concern for acute ischemia, consider MRI of the brain with diffusion-weighted sequences.   Electronically Signed   By: Elon Alas   On: 04/14/2013 23:43    EKG Interpretation    Date/Time:  Tuesday April 14 2013 23:16:22 EST Ventricular Rate:  76 PR Interval:  174 QRS Duration: 80 QT Interval:  385 QTC Calculation: 433 R Axis:   7 Text Interpretation:  Sinus rhythm Confirmed by Morris Longenecker  MD, Katianne Barre ([id number]) on 04/15/2013 1:34:52 AM            MDM   1. Headache   2. Altered mental state    AMS with HA.  Concern for HTN emergency/ encephalopathy vs CNS bleed vs Stroke vs metabolic vs other. Stroke eval, CT head stat.  Nebs for exp wheezing.  EKG. No meningismus, no fevers.    Pt improved on recheck, main issues is HA.  Abnormal CT head, possible old or subacute stroke, no bleeding - out of TPA window.  Non focal neuro exam.   Discussed with [facility] for observation, MRI in am.  BP improved in ED.  The patients results and plan were reviewed and discussed.   Any x-rays performed were personally reviewed by myself.   Differential diagnosis were considered with the presenting HPI.  Diagnosis: above  EKG: reviewed  Admission/ observation were discussed with the admitting physician, patient and/or family and they are comfortable with the plan.      Mariea Clonts, MD 04/15/13 [phone]

## 2013-04-15 NOTE — Progress Notes (Signed)
Talked to patient about discharge planning; patient works part time at [employer] and stated that he has Medicare/ Medicaid- he signed up for it 2 weeks ago and has not received his card in the mail ( ? If approved for Medicare/ Medicaid). Patient does not have a PCP and is agreeable to go to the [facility]- apt made for Apr 30, 2013 at [facility] Hoff RN,BSN,MHA [phone]

## 2013-04-15 NOTE — H&P (Signed)
[facility] History and Physical  [name] UDT:[id number] DOB: [birth year] DOA: 04/14/2013  Referring physician: ER physician. PCP: No PCP Per Patient   Chief Complaint: Difficulty speaking, confusion and headache.  HPI: [name] is a 53 y.o. male with history of hypertension presently on no medications, history of nonischemic cardiomyopathy for cardiac catheter in 2009 has been experiencing headache over the last 2 weeks. Headache has been mostly on the right temporal side constant associated with off and on nausea vomiting and sometimes photophobia. Last evening around 4 PM patient had worsening of his headache and also was found to be confused with difficulty speaking. Patient's daughter called patient's friend later 7 PM and at that point EMS was called and patient was brought to the ER. In the ER patient pharmacy got better and presently is alert awake oriented time place and person but does not recall exactly what happened earlier in the day. CT head shows abnormal features which requires further studies with MRI. Patient has right-sided headache for which migraine cocktail was given by the ER physician the patient still has headache. Patient's friend states that 2 weeks he had a fall and was taken to hospital at [facility] and CT head done at that time was not showing anything acute. Patient was advised admission but signed out AMA. Patient also states that for the last few months he has been having increasing shortness of breath on exertion but denies any chest pain cough productive sputum. Denies any fever chills abdominal pain diarrhea. Patient did not have any loss of function of upper extremities or any difficulty seeing or swallowing.  Review of Systems: As presented in the history of presenting illness, rest negative.  Past Medical History  Diagnosis Date  . Hypertension    Past Surgical History  Procedure Laterality Date  . Hip surgery    . Knee  surgery    . Cardiac catheterization     Social History:  reports that he has been smoking.  He has never used smokeless tobacco. He reports that he drinks alcohol. He reports that he does not use illicit drugs. Where does patient live home. Can patient participate in ADLs? Yes.  No Known Allergies  Family History:  Family History  Problem Relation Age of Onset  . CAD Father   . Bone cancer Mother       Prior to Admission medications   Not on File    Physical Exam: Filed Vitals:   04/15/13 0200 04/15/13 0205 04/15/13 0215 04/15/13 0245  BP: 141/86  110/82 141/84  Pulse: 65  66 61  Temp:  98.3 F (36.8 C)  97.9 F (36.6 C)  TempSrc:    Oral  Resp: 13  14 18   Height:    6\' 2"  (1.88 m)  Weight:    120.3 kg (265 lb 3.4 oz)  SpO2: 98%  98% 99%     General:  Well-developed and nourished.  Eyes: Both eyes are congested. No pallor. Pupils are reacting to light.  ENT: No discharge from the ears eyes nose mouth.  Neck: No mass felt. No JVD appreciated.  Cardiovascular: S1-S2 heard.  Respiratory: No rhonchi or crepitations.  Abdomen: Soft nontender bowel sounds present.  Skin: No rash.  Musculoskeletal: No edema.  Psychiatric: Appears normal.  Neurologic: Alert awake oriented to time place and person. Moves all extremities 5 x 5. No facial asymmetry. Tongue is midline.  Labs on Admission:  Basic Metabolic Panel:  Recent Labs Lab 04/14/13 2342  NA 142  K 3.9  CL 106  CO2 21  GLUCOSE 84  BUN 8  CREATININE 0.86  CALCIUM 9.0   Liver Function Tests:  Recent Labs Lab 04/14/13 2342  AST 15  ALT 18  ALKPHOS 69  BILITOT 0.8  PROT 7.0  ALBUMIN 3.8   No results found for this basename: LIPASE, AMYLASE,  in the last 168 hours No results found for this basename: AMMONIA,  in the last 168 hours CBC:  Recent Labs Lab 04/14/13 2342  WBC 7.5  NEUTROABS 3.3  HGB 15.4  HCT 44.2  MCV 88.9  PLT 210   Cardiac Enzymes:  Recent Labs Lab  04/14/13 2342  TROPONINI <0.30    BNP (last 3 results) No results found for this basename: PROBNP,  in the last 8760 hours CBG:  Recent Labs Lab 04/14/13 2357  GLUCAP 90    Radiological Exams on Admission: Ct Head (brain) Wo Contrast  04/14/2013   CLINICAL DATA:  Right-sided headache, confusion, and ability to follow commands.  EXAM: CT HEAD WITHOUT CONTRAST  TECHNIQUE: Contiguous axial images were obtained from the base of the skull through the vertex without intravenous contrast.  COMPARISON:  CT of head December 08, 2007. The ventricles and sulci remain normal for patient's age. Cerebellar tonsils after not below the foramen magnum. Patchy at least 1.5 cm left cerebellar hypodensity. In addition, subtle hypodensity within the left corpus callosum. Image 20/31. No acute large vascular territory infarct. No intraparenchymal hemorrhage, mass effect or midline shift.  No abnormal extra-axial fluid collections. No skull fracture. Poor dentition. No paranasal sinus air-fluid levels. Mastoid air cells are well-aerated. No skull fracture. Remote bilateral minimally depressed nasal bone fractures. Ocular globes and orbital contents are nonsuspicious though not tailored for evaluation.  FINDINGS: The ventricles and sulci remain normal for patient's age. Cerebellar tonsils after not below the foramen magnum. Patchy at least 1.5 cm left cerebellar hypodensity. In addition, subtle hypodensity within the left corpus callosum, image 20/31. No acute large vascular territory infarct. No intraparenchymal hemorrhage, mass effect or midline shift.  No abnormal extra-axial fluid collections. No skull fracture. Poor dentition. No paranasal sinus air-fluid levels. Mastoid air cells are well-aerated. No skull fracture. Remote bilateral minimally depressed nasal bone fractures. Ocular globes and orbital contents are nonsuspicious though not tailored for evaluation.  IMPRESSION: Patchy left corpus callosum and left  cerebellar hypodensities, new from prior examination, though are age indeterminate. If clinical concern for acute ischemia, consider MRI of the brain with diffusion-weighted sequences.   Electronically Signed   By: Elon Alas   On: 04/14/2013 23:43    EKG: Independently reviewed. Normal sinus rhythm.  Assessment/Plan Principal Problem:   Acute encephalopathy Active Problems:   Difficulty speaking   Headache(784.0)   Tobacco abuse   HTN (hypertension)   1. Acute encephalopathy with headache and difficulty speaking - patient's encephalopathy is improved. Patient is afebrile. CT head is abnormal. Patient's symptoms are concerning for possible stroke versus migraine. I have discussed with on-call neurologist Dr. Aram Beecham, who will be seeing patient in consult. At this time Dr. Aram Beecham has requested MRI brain with and without contrast. Patient will be placed on neurochecks swallow evaluation and have ordered further labs for stroke workup including carotid Doppler and 2-D echo. 2. Headache - possibly secondary to migraine. Patient's headache has not improved with migraine cocktail. At this time I have ordered sedimentation rate and also place patient on when necessary IV morphine. Neurologist consulted for further recommendations. 3.  Shortness of breath with history of nonischemic cardiomyopathy per cardiac catheter in 2009 - presently patient is not in distress. Chest x-ray has been ordered and is pending. Check 2-D echo and BNP. Patient was initially given nebulizer treatment in the ER presently patient is not wheezing. Further recommendations based on 2-D echo report and clinical course. As per cardiology notes in 2009 patient's nonischemic cardiomyopathy could be alcohol related but patient states he only drinks occasionally now a days. 4. History of hypertension - patient has not been taking any medications for more than a year due to financial reasons. Closely observe blood pressure  trends. 5. Tobacco abuse - patient advised to quit smoking.  I have reviewed patient's old charts and labs. Discussed with on-call neurologist.    Code Status: Full code.  Family Communication: None.  Disposition Plan: Admit to inpatient.    [name] N. [facility] Pager [phone].  If 7PM-7AM, please contact night-coverage [email or web address] Password [facility] 04/15/2013, 2:59 AM

## 2013-04-16 DIAGNOSIS — F17200 Nicotine dependence, unspecified, uncomplicated: Secondary | ICD-10-CM

## 2013-04-16 MED ORDER — ASPIRIN 325 MG PO TBEC: 325.0000 mg | DELAYED_RELEASE_TABLET | Freq: Every day | ORAL | Status: DC

## 2013-04-16 MED ORDER — TRAMADOL HCL 50 MG PO TABS: 50.0000 mg | ORAL_TABLET | Freq: Four times a day (QID) | ORAL | Status: DC | PRN

## 2013-04-16 MED ORDER — METHYLPREDNISOLONE 4 MG PO KIT: PACK | ORAL | Status: DC

## 2013-04-16 MED ORDER — GABAPENTIN 300 MG PO CAPS: 300.0000 mg | ORAL_CAPSULE | Freq: Three times a day (TID) | ORAL | Status: DC

## 2013-04-16 NOTE — Evaluation (Signed)
Physical Therapy Evaluation Patient Details Name: [name] MRN: [id number] DOB: Nov 15, 1960 Today's Date: 04/16/2013 Time: 4315-4008 PT Time Calculation (min): 9 min  PT Assessment / Plan / Recommendation History of Present Illness  pt presents with headache.    Clinical Impression  Pt at baseline level of mobility per pt and demos good independence. No further PT needs at this time.  Will sign off.      PT Assessment  Patent does not need any further PT services    Follow Up Recommendations  No PT follow up    Does the patient have the potential to tolerate intense rehabilitation      Barriers to Discharge        Equipment Recommendations  None recommended by PT    Recommendations for Other Services     Frequency      Precautions / Restrictions Precautions Precautions: None Restrictions Weight Bearing Restrictions: No   Pertinent Vitals/Pain Denied pain.        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      Exercises     PT Diagnosis:    PT Problem List:   PT Treatment Interventions:       PT Goals(Current goals can be found in the care plan section)    Visit Information  Last PT Received On: 04/16/13 Assistance Needed: +1 History of Present Illness: pt presents with headache.         Prior [place] expects to be discharged to:: Private residence Living Arrangements: Children Available Help at Discharge: Family;Friend(s);Available PRN/intermittently Type of Home: House Home Access: Level entry Home Layout: Two level;Able to live on main level with bedroom/bathroom Home Equipment: None  Lives  With: Family Prior Function Level of Independence: Independent Communication Communication: No difficulties Dominant Hand: Right    Cognition  Cognition Arousal/Alertness: Awake/alert Behavior During Therapy: WFL for tasks assessed/performed Overall Cognitive Status: Within Functional Limits for tasks assessed    Extremity/Trunk Assessment Upper Extremity Assessment Upper Extremity Assessment: Overall WFL for tasks assessed Lower Extremity Assessment Lower Extremity Assessment: Overall WFL for tasks assessed   Balance Balance Overall balance assessment: Independent  End of Session PT - End of Session Activity Tolerance: Patient tolerated treatment well Patient left: in bed;with call bell/phone within reach Nurse Communication: Mobility status  GP     Catarina Hartshorn, [place] 04/16/2013, 12:12 PM

## 2013-04-16 NOTE — Discharge Summary (Signed)
Physician Discharge Summary  [name] VZD:[id number] DOB: Jan 16, 1961 DOA: 04/14/2013  PCP: No PCP Per Patient  Admit date: 04/14/2013 Discharge date: 04/16/2013  Time spent: 35 minutes    Discharge Diagnoses:  Principal Problem:   Acute encephalopathy Active Problems:   Difficulty speaking   Headache(784.0)   Tobacco abuse   HTN (hypertension)   Discharge Condition: improved  Diet recommendation: cardiac  Filed Weights   04/15/13 0245  Weight: 120.3 kg (265 lb 3.4 oz)    History of present illness:  BLEU MINERD is a 53 y.o. male with history of hypertension presently on no medications, history of nonischemic cardiomyopathy for cardiac catheter in 2009 has been experiencing headache over the last 2 weeks. Headache has been mostly on the right temporal side constant associated with off and on nausea vomiting and sometimes photophobia. Last evening around 4 PM patient had worsening of his headache and also was found to be confused with difficulty speaking. Patient's daughter called patient's friend later 7 PM and at that point EMS was called and patient was brought to the ER. In the ER patient pharmacy got better and presently is alert awake oriented time place and person but does not recall exactly what happened earlier in the day. CT head shows abnormal features which requires further studies with MRI. Patient has right-sided headache for which migraine cocktail was given by the ER physician the patient still has headache. Patient's friend states that 2 weeks he had a fall and was taken to hospital at [facility] and CT head done at that time was not showing anything acute. Patient was advised admission but signed out AMA. Patient also states that for the last few months he has been having increasing shortness of breath on exertion but denies any chest pain cough productive sputum. Denies any fever chills abdominal pain diarrhea. Patient did not have any loss of  function of upper extremities or any difficulty seeing or swallowing   Hospital Course:  53 yo M with persistent throbbing unilateral headache associated with photo/phonophobia and sometimes n/v. With normal imaging, I would not pursue any further workup at this time. With family history, I suspect status migrainosus as etiology and would treat as such.  Recommendations:  1)Medrol dosepack  2) gabapentin 300mg  TID x 2 weeks.  3) can f/u with neurology in 3 - 6 weeks if headaches persist 4)appointment at [facility]   Procedures:    Consultations:  neuro  Discharge Exam: Filed Vitals:   04/16/13 0636  BP: 137/70  Pulse: 54  Temp: 98 F (36.7 C)  Resp: 18    General: A+Ox3 Cardiovascular: rrr Respiratory: clear anterior  Discharge Instructions  Discharge Orders   Future Appointments Provider Department Dept Phone   04/30/2013 11:00 AM [facility] Covering Provider [address] [place] [phone]   Future Orders Complete By Expires   Diet - low sodium heart healthy  As directed    Increase activity slowly  As directed        Medication List         aspirin 325 MG EC tablet  Take 1 tablet (325 mg total) by mouth daily.     traMADol 50 MG tablet  Commonly known as:  ULTRAM  Take 1 tablet (50 mg total) by mouth every 6 (six) hours as needed for moderate pain.       No Known Allergies     Follow-up Information   Follow up with [facility]     On 04/30/2013. (at 11am)    Contact information:   [place] [address] [phone]       The results of significant diagnostics from this hospitalization (including imaging, microbiology, ancillary and laboratory) are listed below for reference.    Significant Diagnostic Studies: Ct Head (brain) Wo Contrast  04/14/2013   CLINICAL DATA:  Right-sided headache, confusion, and ability to follow commands.  EXAM: CT HEAD WITHOUT CONTRAST   TECHNIQUE: Contiguous axial images were obtained from the base of the skull through the vertex without intravenous contrast.  COMPARISON:  CT of head December 08, 2007. The ventricles and sulci remain normal for patient's age. Cerebellar tonsils after not below the foramen magnum. Patchy at least 1.5 cm left cerebellar hypodensity. In addition, subtle hypodensity within the left corpus callosum. Image 20/31. No acute large vascular territory infarct. No intraparenchymal hemorrhage, mass effect or midline shift.  No abnormal extra-axial fluid collections. No skull fracture. Poor dentition. No paranasal sinus air-fluid levels. Mastoid air cells are well-aerated. No skull fracture. Remote bilateral minimally depressed nasal bone fractures. Ocular globes and orbital contents are nonsuspicious though not tailored for evaluation.  FINDINGS: The ventricles and sulci remain normal for patient's age. Cerebellar tonsils after not below the foramen magnum. Patchy at least 1.5 cm left cerebellar hypodensity. In addition, subtle hypodensity within the left corpus callosum, image 20/31. No acute large vascular territory infarct. No intraparenchymal hemorrhage, mass effect or midline shift.  No abnormal extra-axial fluid collections. No skull fracture. Poor dentition. No paranasal sinus air-fluid levels. Mastoid air cells are well-aerated. No skull fracture. Remote bilateral minimally depressed nasal bone fractures. Ocular globes and orbital contents are nonsuspicious though not tailored for evaluation.  IMPRESSION: Patchy left corpus callosum and left cerebellar hypodensities, new from prior examination, though are age indeterminate. If clinical concern for acute ischemia, consider MRI of the brain with diffusion-weighted sequences.   Electronically Signed   By: Elon Alas   On: 04/14/2013 23:43   Mr Angiogram Neck W Contrast  04/15/2013   CLINICAL DATA:  Headache with confusion.  EXAM: MRA NECK WITHOUT AND WITH  CONTRAST  TECHNIQUE: Multiplanar and multiecho pulse sequences of the neck were obtained without and with intravenous contrast. Angiographic images of the neck were obtained using MRA technique without and with intravenous contrast.  CONTRAST:  MultiHance 20 mL.  COMPARISON:  CT head 04/14/2013.  FINDINGS: Artifactual signal loss on processed images from the proximal right and left common carotid artery. These vessels appear unremarkable on source images.  Unremarkable  innominate artery and proximal subclavians.  The carotid bifurcations are free of disease. There is no ulceration or stenosis. There is no cervical ICA dissection.  Both vertebrals appear patent throughout their cervical course but there is a probable 75% ostial stenosis on the left. No ostial stenosis is seen on the right.  IMPRESSION: Artifactual signal dropout of the proximal great vessels without proximal stenosis.  No carotid bifurcation disease or cervical ICA dissection.  75% stenosis at the origin of the left vertebral, potentially flow reducing.   Electronically Signed   By: Rolla Flatten M.D.   On: 04/15/2013 21:19   Mr Jeri Cos VZ Contrast  04/15/2013   CLINICAL DATA:  Headache with confusion.  Normal neurologic exam.  EXAM: MRI HEAD WITHOUT AND WITH CONTRAST  TECHNIQUE: Multiplanar, multiecho pulse sequences of the brain and surrounding structures were obtained without and with intravenous contrast.  CONTRAST:  12mL  MULTIHANCE GADOBENATE DIMEGLUMINE 529 MG/ML IV SOLN  COMPARISON:  CT HEAD W/O CM dated 04/14/2013; MR C SPINE W/O CM dated 12/10/2007; MR HEAD W/O CM dated 12/09/2007  FINDINGS: The patient was unable to remain motionless for the exam. Small or subtle lesions could be overlooked.  No evidence for acute infarction, hemorrhage, mass lesion, hydrocephalus, or extra-axial fluid. Slight premature for age cerebral and cerebellar atrophy. Mild subcortical and periventricular T2 and FLAIR hyperintensities, likely chronic microvascular  ischemic change. Flow voids are maintained throughout the carotid, basilar, and vertebral arteries. There are no areas of chronic hemorrhage.  There is mild tonsillar ectopia. No Chiari I malformation is present. The appearance is unchanged from 2009.  Post infusion, no abnormal enhancement of the brain or meninges. Visualized calvarium, skull base, and upper cervical osseous structures unremarkable. Scalp and extracranial soft tissues, orbits, sinuses, and mastoids show no acute process.  Compared with 2009, small vessel disease was present at that time. Compared with prior CT, there is no infarct in the cerebellum.  IMPRESSION: Slight premature atrophy. Mild chronic microvascular ischemic change. No acute intracranial findings. No abnormal postcontrast enhancement.  The area of hypoattenuation on CT is apparently artifactual, related to beam hardening.   Electronically Signed   By: Rolla Flatten M.D.   On: 04/15/2013 21:23   Dg Chest Port 1 View  04/15/2013   CLINICAL DATA:  Shortness of breath  EXAM: PORTABLE CHEST - 1 VIEW  COMPARISON:  12/08/2007  FINDINGS: Interval enlargement of the cardiac silhouette and mediastinal contours. Interval development of wedge-shaped heterogeneous possible airspace opacities within the peripheral aspect of the right mid lung. Worsening bibasilar opacities, left greater than right. No pleural effusion or pneumothorax. Mild pulmonary venous congestion without frank evidence of edema. No acute osseus abnormalities.  IMPRESSION: 1. Apparent enlargement of the cardiac silhouette and mediastinal contours, possibly accentuated due to decreased lung volumes and AP projection. Further evaluation with cardiac echo could be performed as clinically indicated. 2. Pulmonary venous congestion without frank evidence of edema. 3. Slightly wedge-shaped heterogeneous airspace opacities within the peripheral aspect of the right mid lung, atelectasis versus infiltrate. Continued attention on  follow-up is recommended.   Electronically Signed   By: Sandi Mariscal M.D.   On: 04/15/2013 07:28   Mr Jodene Nam Head/brain Wo Cm  04/15/2013   CLINICAL DATA:  Hypertension with headache. Confusion, now with normal neurologic exam.  EXAM: MRA HEAD WITHOUT CONTRAST  TECHNIQUE: Angiographic images of the Circle of Willis were obtained using MRA technique without intravenous contrast.  COMPARISON:  MR HEAD WO/W CM dated 04/15/2013; CT HEAD W/O CM dated 04/14/2013; MR C SPINE W/O CM dated 12/10/2007; CT HEAD W/O CM dated 12/08/2007; MR MRA NECK W/CM dated 04/15/2013  FINDINGS: The internal carotid arteries are widely patent. The basilar artery is widely patent with both vertebrals contributing. The left is slightly smaller. There is a fetal origin to the right PCA. There is no visible intracranial stenosis or berry aneurysm.  IMPRESSION: Unremarkable MRA intracranial circulation.   Electronically Signed   By: Rolla Flatten M.D.   On: 04/15/2013 21:26    Microbiology: No results found for this or any previous visit (from the past 240 hour(s)).   Labs: Basic Metabolic Panel:  Recent Labs Lab 04/14/13 2342 04/15/13 0850  NA 142 142  K 3.9 4.3  CL 106 105  CO2 21 24  GLUCOSE 84 106*  BUN 8 9  CREATININE 0.86 0.87  CALCIUM 9.0 8.5   Liver Function  Tests:  Recent Labs Lab 04/14/13 2342 04/15/13 0850  AST 15 15  ALT 18 17  ALKPHOS 69 61  BILITOT 0.8 1.1  PROT 7.0 6.5  ALBUMIN 3.8 3.5   No results found for this basename: LIPASE, AMYLASE,  in the last 168 hours No results found for this basename: AMMONIA,  in the last 168 hours CBC:  Recent Labs Lab 04/14/13 2342 04/15/13 0850  WBC 7.5 5.5  NEUTROABS 3.3 2.4  HGB 15.4 14.9  HCT 44.2 45.1  MCV 88.9 90.2  PLT 210 201   Cardiac Enzymes:  Recent Labs Lab 04/14/13 2342  TROPONINI <0.30   BNP: BNP (last 3 results)  Recent Labs  04/15/13 0850  PROBNP 11.1   CBG:  Recent Labs Lab 04/14/13 2357  GLUCAP 90        Signed:  Jesicca Dipierro  [facility] 04/16/2013, 10:10 AM

## 2013-04-16 NOTE — Progress Notes (Signed)
Patient d/c this afternoon, Assessments remain unchanged as at now. Awaiting on transportation.

## 2013-04-16 NOTE — Evaluation (Signed)
Speech Language Pathology Evaluation Patient Details Name: [name] MRN: [id number] DOB: 08-01-1960 Today's Date: 04/16/2013 Time: 9563-8756 SLP Time Calculation (min): 35 min  Problem List:  Patient Active Problem List   Diagnosis Date Noted  . Altered mental state 04/15/2013  . Acute encephalopathy 04/15/2013  . Difficulty speaking 04/15/2013  . Headache(784.0) 04/15/2013  . Tobacco abuse 04/15/2013  . HTN (hypertension) 04/15/2013   Past Medical History:  Past Medical History  Diagnosis Date  . Hypertension    Past Surgical History:  Past Surgical History  Procedure Laterality Date  . Hip surgery    . Knee surgery    . Cardiac catheterization     HPI:  53 y/o with daily, intermittent HA for the past 3-4 weeks, and with reported confusion on day of admit.  Pt's mental status was intact and has no lateralizing neurological signs when seen by neuro.  CT and MRI head negative for acute changes.  SLE ordered. Pt resides at home with his children - he works part time as Freight forwarder for [employer].  Pt states his job is stressful.     Assessment / Plan / Recommendation Clinical Impression  Pt presents with intact cognitive linguistic skills for his environment.  His career does require high level cognition as he is a Freight forwarder for [employer].  Cognistat memory subtest revealed mild memory impairment - retrieval.  Pt able to carry on high level conversation with intact language, use.    He admits to becoming angry easily due to stresses in his life.   SLP discussed importance of managing pt blood pressure and decreasing stress - pt acknowledged importance.  Pt states he did not take his blood pressure medications due to finances-but he believes now he will obtain Medicaid to allow medication tx.    All education completed therefore SLP to sign off.      SLP Assessment  Patient does not need any further Speech Lanaguage Pathology Services    Follow Up Recommendations  None     Frequency and Duration        Pertinent Vitals/Pain Afebrile, decreased   SLP Goals     SLP Evaluation Prior Functioning  Cognitive/Linguistic Baseline: Within functional limits  Lives With: Family Education: HS education Vocation: Part time employment   Cognition  Overall Cognitive Status: Within Functional Limits for tasks assessed Arousal/Alertness: Awake/alert Orientation Level: Oriented X4 Attention: Selective Selective Attention: Appears intact Memory: Impaired (scored 8/12 showing mild memory impairment on Cognistat subtest) Memory Impairment: Retrieval deficit Awareness: Appears intact Problem Solving: Appears intact Behaviors: Restless Safety/Judgment: Appears intact Comments: pt verbalized concern for testing procedure when arithmetic was evaluated stating he didn't want to do it, but participated readily with encouragement    Comprehension  Auditory Comprehension Overall Auditory Comprehension: Appears within functional limits for tasks assessed Yes/No Questions: Not tested Commands: Within Functional Limits Conversation: Complex Visual Recognition/Discrimination Discrimination: Not tested Reading Comprehension Reading Status: Not tested (pt needs reading glasses, not at hospital)    Expression Expression Primary Mode of Expression: Verbal Verbal Expression Overall Verbal Expression: Appears within functional limits for tasks assessed Initiation: No impairment Level of Generative/Spontaneous Verbalization: Conversation Naming: Not tested Pragmatics: No impairment Written Expression Dominant Hand: Right   Oral / Motor Oral Motor/Sensory Function Overall Oral Motor/Sensory Function: Appears within functional limits for tasks assessed Motor Speech Overall Motor Speech: Appears within functional limits for tasks assessed Respiration: Within functional limits Resonance: Within functional limits Articulation: Within functional  limitis Intelligibility: Intelligible Motor  Planning: Witnin functional limits Motor Speech Errors: Not applicable   [place], [place] SLP [phone]

## 2013-04-16 NOTE — Progress Notes (Signed)
Subjective: No abnormality on MRI to cause headaches. Headache is markedly imporved, but not completely resolved.   Exam: Filed Vitals:   04/16/13 1006  BP: 130/65  Pulse: 58  Temp: 97.4 F (36.3 C)  Resp: 18   Gen: In bed, NAD MS: Awake, Alert, interactive and appropriate IW:OEHOZ, EOMI Motor: 5/5 throughout Sensory:intact to LT  MRI/MRA - no cause of headache. No signs of vasculitis or dissection.   Family hx: brother with migraines  Impression: 53 yo M with persistent throbbing unilateral headache associated with photo/phonophobia and sometimes n/v. With normal imaging, I would not pursue any further workup at this time. With family history, I suspect status migrainosus as etiology and would treat as such.   Recommendations: 1)Medrol dosepack 2) gabapentin 300mg  TID x 2 weeks.  3) ok to D/C home from neurological perspective.  4) can f/u with neurology in 3 - 6 weeks if headaches persist.   Roland Rack, MD [facility] [phone]  If 7pm- 7am, please page neurology on call at [phone].

## 2013-04-30 ENCOUNTER — Inpatient Hospital Stay: Payer: Self-pay

## 2013-08-18 ENCOUNTER — Emergency Department (HOSPITAL_COMMUNITY)
Admission: EM | Admit: 2013-08-18 | Discharge: 2013-08-18 | Disposition: A | Discharge status: Home / Self Care | Payer: Self-pay | Attending: Emergency Medicine | Admitting: Emergency Medicine

## 2013-08-18 ENCOUNTER — Encounter (HOSPITAL_COMMUNITY): Payer: Self-pay | Admitting: Emergency Medicine

## 2013-08-18 ENCOUNTER — Emergency Department (HOSPITAL_COMMUNITY): Payer: Self-pay

## 2013-08-18 DIAGNOSIS — I10 Essential (primary) hypertension: Secondary | ICD-10-CM | POA: Insufficient documentation

## 2013-08-18 DIAGNOSIS — S91209A Unspecified open wound of unspecified toe(s) with damage to nail, initial encounter: Secondary | ICD-10-CM

## 2013-08-18 DIAGNOSIS — S93609A Unspecified sprain of unspecified foot, initial encounter: Secondary | ICD-10-CM | POA: Insufficient documentation

## 2013-08-18 DIAGNOSIS — Z79899 Other long term (current) drug therapy: Secondary | ICD-10-CM | POA: Insufficient documentation

## 2013-08-18 DIAGNOSIS — W2203XA Walked into furniture, initial encounter: Secondary | ICD-10-CM | POA: Insufficient documentation

## 2013-08-18 DIAGNOSIS — Z7982 Long term (current) use of aspirin: Secondary | ICD-10-CM | POA: Insufficient documentation

## 2013-08-18 DIAGNOSIS — F17200 Nicotine dependence, unspecified, uncomplicated: Secondary | ICD-10-CM | POA: Insufficient documentation

## 2013-08-18 DIAGNOSIS — Y9389 Activity, other specified: Secondary | ICD-10-CM | POA: Insufficient documentation

## 2013-08-18 DIAGNOSIS — S91109A Unspecified open wound of unspecified toe(s) without damage to nail, initial encounter: Secondary | ICD-10-CM | POA: Insufficient documentation

## 2013-08-18 DIAGNOSIS — S93502A Unspecified sprain of left great toe, initial encounter: Secondary | ICD-10-CM

## 2013-08-18 DIAGNOSIS — Z9889 Other specified postprocedural states: Secondary | ICD-10-CM | POA: Insufficient documentation

## 2013-08-18 DIAGNOSIS — Y929 Unspecified place or not applicable: Secondary | ICD-10-CM | POA: Insufficient documentation

## 2013-08-18 MED ORDER — OXYCODONE-ACETAMINOPHEN 5-325 MG PO TABS: 1.0000 | ORAL_TABLET | ORAL | Status: DC | PRN

## 2013-08-18 MED ORDER — IBUPROFEN 800 MG PO TABS: 800.0000 mg | ORAL_TABLET | Freq: Three times a day (TID) | ORAL | Status: DC

## 2013-08-18 MED ORDER — CEPHALEXIN 500 MG PO CAPS: 500.0000 mg | ORAL_CAPSULE | Freq: Four times a day (QID) | ORAL | Status: DC

## 2013-08-18 MED ORDER — OXYCODONE-ACETAMINOPHEN 5-325 MG PO TABS
1.0000 | ORAL_TABLET | Freq: Once | ORAL | Status: AC
  Administered 2013-08-18: 1 via ORAL
  Filled 2013-08-18: qty 1

## 2013-08-18 MED ORDER — OXYCODONE-ACETAMINOPHEN 5-325 MG PO TABS: 2.0000 | ORAL_TABLET | ORAL | Status: DC | PRN

## 2013-08-18 NOTE — Discharge Instructions (Signed)
Keep toe elevated, ice. Wash with soap and water. Keep it clean. Ibuprofen and percocet for pain. Keflex to prevent infection. Follow up in 10 days for suture removal. Return if worsening swelling or pain.   Nail Avulsion Injury Nail avulsion means that you have lost the whole, or part of a nail. The nail will usually grow back in 2 to 6 months. If your injury damaged the growth center of the nail, the nail may be deformed, split, or not stuck to the nail bed. Sometimes the avulsed nail is stitched back in place. This provides temporary protection to the nail bed until the new nail grows in.  HOME CARE INSTRUCTIONS   Raise (elevate) your injury as much as possible.  Protect the injury and cover it with bandages (dressings) or splints as instructed.  Change dressings as instructed. SEEK MEDICAL CARE IF:   There is increasing pain, redness, or swelling.  You cannot move your fingers or toes. Document Released: 04/19/2004 Document Revised: 06/04/2011 Document Reviewed: 02/11/2009 [facility] Patient Information 2014 [place], [place].

## 2013-08-18 NOTE — ED Notes (Signed)
Pt was moving furniture and a piece slid back on left foot and injured left big toe. Toenail is almost removed from toe.

## 2013-08-18 NOTE — ED Notes (Signed)
Family at bedside. 

## 2013-08-18 NOTE — ED Provider Notes (Signed)
Medical screening examination/treatment/procedure(s) were performed by non-physician practitioner and as supervising physician I was immediately available for consultation/collaboration.   EKG Interpretation None       Merryl Hacker, MD 08/18/13 [phone]

## 2013-08-18 NOTE — ED Provider Notes (Signed)
CSN: [id number]     Arrival date & time 08/18/13  1321 History  This chart was scribed for non-physician practitioner, Jeannett Senior, PA-C working with Orpah Greek, MD by Frederich Balding, ED scribe. This patient was seen in room TR06C/TR06C and the patient's care was started at 3:12 PM.   Chief Complaint  Patient presents with  . Toe Pain   The history is provided by the patient. No language interpreter was used.   HPI Comments: [name] is a 53 y.o. male who presents to the Emergency Department complaining of left first toe injury that occurred earlier today around noon. Pt was moving furniture and a piece of it slide back onto his toe. States he did not directly drop any furniture on his toe. He has sudden onset left first toe pain and states the toenail is coming off. Bearing weight worsens the name.   Past Medical History  Diagnosis Date  . Hypertension    Past Surgical History  Procedure Laterality Date  . Hip surgery    . Knee surgery    . Cardiac catheterization     Family History  Problem Relation Age of Onset  . CAD Father   . Bone cancer Mother    History  Substance Use Topics  . Smoking status: Current Every Day Smoker -- 0.50 packs/day  . Smokeless tobacco: Never Used  . Alcohol Use: Yes     Comment: on occasion    Review of Systems  Musculoskeletal: Positive for arthralgias.  All other systems reviewed and are negative.  Allergies  Review of patient's allergies indicates no known allergies.  Home Medications   Prior to Admission medications   Medication Sig Start Date End Date Taking? Authorizing Provider  aspirin EC 325 MG EC tablet Take 1 tablet (325 mg total) by mouth daily. 04/16/13   Geradine Girt, DO  gabapentin (NEURONTIN) 300 MG capsule Take 1 capsule (300 mg total) by mouth 3 (three) times daily. 04/16/13   Geradine Girt, DO  methylPREDNISolone (MEDROL DOSEPAK) 4 MG tablet follow package directions 04/16/13   Geradine Girt, DO  traMADol (ULTRAM) 50 MG tablet Take 1 tablet (50 mg total) by mouth every 6 (six) hours as needed for moderate pain. 04/16/13   Jessica U Vann, DO   BP 135/89  Pulse 78  Temp(Src) 98.3 F (36.8 C)  Resp 18  SpO2 98%  Physical Exam  Nursing note and vitals reviewed. Constitutional: He is oriented to person, place, and time. He appears well-developed and well-nourished. No distress.  HENT:  Head: Normocephalic and atraumatic.  Eyes: EOM are normal.  Neck: Neck supple. No tracheal deviation present.  Cardiovascular: Normal rate.   Pulmonary/Chest: Effort normal. No respiratory distress.  Musculoskeletal: Normal range of motion.  Swelling noted to the left great toe and MTP joint. Tender to palpation over entire toe and MTP joint. Partial toenail avulsion with base of the nail still attached. Hemostatic.   Neurological: He is alert and oriented to person, place, and time.  Skin: Skin is warm and dry.  Psychiatric: He has a normal mood and affect. His behavior is normal.    ED Course  Procedures (including critical care time)  DIAGNOSTIC STUDIES: Oxygen Saturation is 98% on RA, normal by my interpretation.    COORDINATION OF CARE: 3:13 PM-Discussed treatment plan which includes xray and removing toenail with pt at bedside and pt agreed to plan.   Labs Review Labs Reviewed - No data to  display  Imaging Review Dg Toe Great Left  08/18/2013   CLINICAL DATA:  Toe nail injury moving furniture  EXAM: LEFT GREAT TOE  COMPARISON:  None.  FINDINGS: There is no evidence of fracture or dislocation. There is no evidence of arthropathy or other focal bone abnormality. Soft tissues are unremarkable. Nail Of the great toe appears partially avulsed. No subcutaneous gas or radiodense foreign body.  IMPRESSION: Negative.   Electronically Signed   By: Arne Cleveland M.D.   On: 08/18/2013 15:47     EKG Interpretation None      MDM   Final diagnoses:  Toenail avulsion  Sprain of  toe, great, left   Avulsion of toenail repair Performed by: Raea Magallon A Binnie Vonderhaar Authorized by: Florene Route Son Barkan Consent: Verbal consent obtained. Risks and benefits: risks, benefits and alternatives were discussed Consent given by: patient Patient identity confirmed: provided demographic data Prepped and Draped in normal sterile fashion Wound explored  Left great toe nail avulsion repair   No Foreign Bodies seen or palpated  Anesthesia: nerve block, see procedure note  Local anesthetic: lidocaine 2% wo epinephrine  Anesthetic total: 4 ml  Irrigation method: syringe Amount of cleaning: standard  Skin closure: nail reattached down with prolene 3.0  Number of sutures: 2  Patient tolerance: Patient tolerated the procedure well with no immediate complications.  NERVE BLOCK Performed by: Florene Route Nalaysia Manganiello Consent: Verbal consent obtained. Required items: required blood products, implants, devices, and special equipment available Time out: Immediately prior to procedure a "time out" was called to verify the correct patient, procedure, equipment, support staff and site/side marked as required.  Indication: left great toe nail avulsion Nerve block body site: left great toe  Preparation: Patient was prepped and draped in the usual sterile fashion. Needle gauge: 25 Location technique: anatomical landmarks  Local anesthetic: lidocaine 2% wo epi  Anesthetic total: 4 ml  Outcome: pain improved Patient tolerance: Patient tolerated the procedure well with no immediate complications.    Pt with partial toenail avulsion and toe swelling. X-ray obtained and is negative. Pt's toe is cleaned, nail is reattached using sutures to protect the nail bed. Will start on keflex prophylactically. Percocet for pain. Follow up for recheck.   Filed Vitals:   08/18/13 1326 08/18/13 1636  BP: 135/89 137/85  Pulse: 78 62  Temp: 98.3 F (36.8 C) 97 F (36.1 C)  TempSrc:  Oral   Resp: 18 19  SpO2: 98% 97%     I personally performed the services described in this documentation, which was scribed in my presence. The recorded information has been reviewed and is accurate.  Renold Genta, PA-C 08/18/13 1657

## 2014-07-21 ENCOUNTER — Encounter (HOSPITAL_COMMUNITY): Payer: Self-pay | Admitting: Family Medicine

## 2014-07-21 ENCOUNTER — Inpatient Hospital Stay (HOSPITAL_COMMUNITY)
Admission: EM | Admit: 2014-07-21 | Discharge: 2014-07-25 | DRG: 200 | Disposition: A | Discharge status: Home / Self Care | Payer: Medicaid Other | Attending: General Surgery | Admitting: General Surgery

## 2014-07-21 ENCOUNTER — Emergency Department (HOSPITAL_COMMUNITY): Payer: Medicaid Other

## 2014-07-21 DIAGNOSIS — S270XXA Traumatic pneumothorax, initial encounter: Principal | ICD-10-CM | POA: Diagnosis present

## 2014-07-21 DIAGNOSIS — S272XXA Traumatic hemopneumothorax, initial encounter: Secondary | ICD-10-CM | POA: Diagnosis present

## 2014-07-21 DIAGNOSIS — S2231XA Fracture of one rib, right side, initial encounter for closed fracture: Secondary | ICD-10-CM

## 2014-07-21 DIAGNOSIS — R0602 Shortness of breath: Secondary | ICD-10-CM

## 2014-07-21 DIAGNOSIS — Z9689 Presence of other specified functional implants: Secondary | ICD-10-CM

## 2014-07-21 DIAGNOSIS — J939 Pneumothorax, unspecified: Secondary | ICD-10-CM | POA: Diagnosis present

## 2014-07-21 DIAGNOSIS — W19XXXA Unspecified fall, initial encounter: Secondary | ICD-10-CM | POA: Diagnosis present

## 2014-07-21 DIAGNOSIS — S2241XA Multiple fractures of ribs, right side, initial encounter for closed fracture: Secondary | ICD-10-CM | POA: Diagnosis present

## 2014-07-21 DIAGNOSIS — I10 Essential (primary) hypertension: Secondary | ICD-10-CM | POA: Diagnosis present

## 2014-07-21 DIAGNOSIS — Z9889 Other specified postprocedural states: Secondary | ICD-10-CM

## 2014-07-21 DIAGNOSIS — W132XXA Fall from, out of or through roof, initial encounter: Secondary | ICD-10-CM | POA: Diagnosis present

## 2014-07-21 DIAGNOSIS — F17210 Nicotine dependence, cigarettes, uncomplicated: Secondary | ICD-10-CM | POA: Diagnosis present

## 2014-07-21 LAB — COMPREHENSIVE METABOLIC PANEL
ALBUMIN: 3.7 g/dL (ref 3.5–5.2)
ALK PHOS: 59 U/L (ref 39–117)
ALT: 21 U/L (ref 0–53)
AST: 29 U/L (ref 0–37)
Anion gap: 5 (ref 5–15)
BUN: 8 mg/dL (ref 6–23)
CHLORIDE: 112 mmol/L (ref 96–112)
CO2: 25 mmol/L (ref 19–32)
Calcium: 9 mg/dL (ref 8.4–10.5)
Creatinine, Ser: 1.08 mg/dL (ref 0.50–1.35)
GFR calc Af Amer: 88 mL/min — ABNORMAL LOW (ref >90)
GFR calc non Af Amer: 76 mL/min — ABNORMAL LOW (ref >90)
Glucose, Bld: 96 mg/dL (ref 70–99)
POTASSIUM: 5.1 mmol/L (ref 3.5–5.1)
SODIUM: 142 mmol/L (ref 135–145)
TOTAL PROTEIN: 6.3 g/dL (ref 6.0–8.3)
Total Bilirubin: 0.9 mg/dL (ref 0.3–1.2)

## 2014-07-21 LAB — CBC
HEMATOCRIT: 45 % (ref 39.0–52.0)
Hemoglobin: 15.2 g/dL (ref 13.0–17.0)
MCH: 29.4 pg (ref 26.0–34.0)
MCHC: 33.8 g/dL (ref 30.0–36.0)
MCV: 87 fL (ref 78.0–100.0)
Platelets: 193 10*3/uL (ref 150–400)
RBC: 5.17 MIL/uL (ref 4.22–5.81)
RDW: 14.6 % (ref 11.5–15.5)
WBC: 5.7 10*3/uL (ref 4.0–10.5)

## 2014-07-21 LAB — SAMPLE TO BLOOD BANK

## 2014-07-21 LAB — URINALYSIS, ROUTINE W REFLEX MICROSCOPIC
BILIRUBIN URINE: NEGATIVE
Glucose, UA: NEGATIVE mg/dL
HGB URINE DIPSTICK: NEGATIVE
Ketones, ur: NEGATIVE mg/dL
Leukocytes, UA: NEGATIVE
Nitrite: NEGATIVE
PROTEIN: NEGATIVE mg/dL
Specific Gravity, Urine: 1.021 (ref 1.005–1.030)
UROBILINOGEN UA: 1 mg/dL (ref 0.0–1.0)
pH: 6 (ref 5.0–8.0)

## 2014-07-21 LAB — I-STAT CHEM 8, ED
BUN: 8 mg/dL (ref 6–23)
CALCIUM ION: 1.21 mmol/L (ref 1.12–1.23)
CHLORIDE: 107 mmol/L (ref 96–112)
Creatinine, Ser: 1 mg/dL (ref 0.50–1.35)
GLUCOSE: 94 mg/dL (ref 70–99)
HEMATOCRIT: 50 % (ref 39.0–52.0)
HEMOGLOBIN: 17 g/dL (ref 13.0–17.0)
Potassium: 4.7 mmol/L (ref 3.5–5.1)
Sodium: 142 mmol/L (ref 135–145)
TCO2: 21 mmol/L (ref 0–100)

## 2014-07-21 LAB — PROTIME-INR
INR: 1.01 (ref 0.00–1.49)
PROTHROMBIN TIME: 13.4 s (ref 11.6–15.2)

## 2014-07-21 LAB — ETHANOL: Alcohol, Ethyl (B): <5 mg/dL (ref 0–9)

## 2014-07-21 MED ORDER — IOHEXOL 300 MG/ML  SOLN
100.0000 mL | Freq: Once | INTRAMUSCULAR | Status: AC | PRN
  Administered 2014-07-21: 100 mL via INTRAVENOUS

## 2014-07-21 MED ORDER — ENOXAPARIN SODIUM 40 MG/0.4ML ~~LOC~~ SOLN: 40.0000 mg | SUBCUTANEOUS | Status: DC

## 2014-07-21 MED ORDER — OXYCODONE HCL 5 MG PO TABS: 5.0000 mg | ORAL_TABLET | ORAL | Status: DC | PRN

## 2014-07-21 MED ORDER — HYDROMORPHONE HCL 1 MG/ML IJ SOLN
1.0000 mg | Freq: Once | INTRAMUSCULAR | Status: AC
  Administered 2014-07-21: 1 mg via INTRAVENOUS
  Filled 2014-07-21: qty 1

## 2014-07-21 MED ORDER — DOCUSATE SODIUM 100 MG PO CAPS
100.0000 mg | ORAL_CAPSULE | Freq: Two times a day (BID) | ORAL | Status: DC
  Administered 2014-07-21 – 2014-07-25 (×8): 100 mg via ORAL
  Filled 2014-07-21 (×8): qty 1

## 2014-07-21 MED ORDER — SODIUM CHLORIDE 0.9 % IV SOLN
INTRAVENOUS | Status: DC
  Administered 2014-07-21: 20:00:00 via INTRAVENOUS

## 2014-07-21 MED ORDER — HYDROMORPHONE HCL 1 MG/ML IJ SOLN
1.0000 mg | INTRAMUSCULAR | Status: DC | PRN
  Administered 2014-07-21 – 2014-07-22 (×3): 2 mg via INTRAVENOUS
  Filled 2014-07-21 (×3): qty 2

## 2014-07-21 MED ORDER — ONDANSETRON HCL 4 MG PO TABS: 4.0000 mg | ORAL_TABLET | Freq: Four times a day (QID) | ORAL | Status: DC | PRN

## 2014-07-21 MED ORDER — ALBUTEROL SULFATE (2.5 MG/3ML) 0.083% IN NEBU
5.0000 mg | INHALATION_SOLUTION | Freq: Once | RESPIRATORY_TRACT | Status: AC
  Administered 2014-07-21: 5 mg via RESPIRATORY_TRACT
  Filled 2014-07-21: qty 6

## 2014-07-21 MED ORDER — FENTANYL CITRATE (PF) 100 MCG/2ML IJ SOLN
100.0000 ug | Freq: Once | INTRAMUSCULAR | Status: AC
  Administered 2014-07-21: 100 ug via NASAL
  Filled 2014-07-21: qty 2

## 2014-07-21 MED ORDER — OXYCODONE HCL 5 MG PO TABS
10.0000 mg | ORAL_TABLET | ORAL | Status: DC | PRN
  Administered 2014-07-21: 10 mg via ORAL
  Filled 2014-07-21: qty 2

## 2014-07-21 MED ORDER — PANTOPRAZOLE SODIUM 40 MG PO TBEC: 40.0000 mg | DELAYED_RELEASE_TABLET | Freq: Every day | ORAL | Status: DC

## 2014-07-21 MED ORDER — PANTOPRAZOLE SODIUM 40 MG IV SOLR: 40.0000 mg | Freq: Every day | INTRAVENOUS | Status: DC

## 2014-07-21 MED ORDER — BISACODYL 10 MG RE SUPP: 10.0000 mg | Freq: Every day | RECTAL | Status: DC | PRN

## 2014-07-21 MED ORDER — ONDANSETRON HCL 4 MG/2ML IJ SOLN: 4.0000 mg | Freq: Four times a day (QID) | INTRAMUSCULAR | Status: DC | PRN

## 2014-07-21 MED ORDER — ACETAMINOPHEN 325 MG PO TABS: 650.0000 mg | ORAL_TABLET | ORAL | Status: DC | PRN

## 2014-07-21 MED ORDER — HYDROCHLOROTHIAZIDE 25 MG PO TABS
25.0000 mg | ORAL_TABLET | Freq: Every day | ORAL | Status: DC
  Administered 2014-07-22 – 2014-07-25 (×4): 25 mg via ORAL
  Filled 2014-07-21 (×4): qty 1

## 2014-07-21 MED ORDER — FENTANYL CITRATE (PF) 100 MCG/2ML IJ SOLN
50.0000 ug | INTRAMUSCULAR | Status: AC | PRN
  Administered 2014-07-21 (×2): 50 ug via INTRAVENOUS
  Filled 2014-07-21 (×2): qty 2

## 2014-07-21 NOTE — ED Notes (Signed)
Per EMS, pt was on the roof renovating and fell approx 12 feet. Denies LOC. A&O. sts pain in right chest area with movement. Decreased lung sounds on right side. Abrasions on legs and stomach. sts fell on right side. CBG 109. sats 99%. Pulse reg 70. BP 150/103

## 2014-07-21 NOTE — ED Notes (Signed)
Dr. Hulen Skains at bedside. Pt. Provided incentive spirometer.

## 2014-07-21 NOTE — ED Provider Notes (Signed)
CSN: [id number]     Arrival date & time 07/21/14  1339 History   First MD Initiated Contact with Patient 07/21/14 1341     Chief Complaint  Patient presents with  . Fall     (Consider location/radiation/quality/duration/timing/severity/associated sxs/prior Treatment) HPI  [name] Is a 54 year old male who presents via EMS for a fall from roof. Patient was working on a roof today when he fell 12 feet landing onto the right side of his chest. He complains of severe right-sided chest pain and shortness of breath. Hypoxic prior to arrival. Patient was placed on nasal cannula and has maintained O2 sats above 90%. He denies hitting his head or losing consciousness. He has no other pain complaints at this time.     Past Medical History  Diagnosis Date  . Hypertension    Past Surgical History  Procedure Laterality Date  . Hip surgery    . Knee surgery    . Cardiac catheterization     Family History  Problem Relation Age of Onset  . CAD Father   . Bone cancer Mother    History  Substance Use Topics  . Smoking status: Current Every Day Smoker -- 0.50 packs/day  . Smokeless tobacco: Never Used  . Alcohol Use: Yes     Comment: on occasion    Review of Systems  Ten systems reviewed and are negative for acute change, except as noted in the HPI.    Allergies  Review of patient's allergies indicates no known allergies.  Home Medications   Prior to Admission medications   Medication Sig Start Date End Date Taking? Authorizing Provider  cephALEXin (KEFLEX) 500 MG capsule Take 1 capsule (500 mg total) by mouth 4 (four) times daily. 08/18/13   Tatyana Kirichenko, PA-C  ibuprofen (ADVIL,MOTRIN) 800 MG tablet Take 1 tablet (800 mg total) by mouth 3 (three) times daily. 08/18/13   Tatyana Kirichenko, PA-C  oxyCODONE-acetaminophen (PERCOCET/ROXICET) 5-325 MG per tablet Take 1 tablet by mouth every 4 (four) hours as needed for severe pain. 08/18/13   Tatyana Kirichenko, PA-C    BP 136/80 mmHg  Pulse 65  Temp(Src) 98.1 F (36.7 C) (Oral)  Resp 29  SpO2 98% Physical Exam  Constitutional: He is oriented to person, place, and time. He appears well-developed and well-nourished. No distress.  HENT:  Head: Normocephalic and atraumatic.  Mouth/Throat: No oropharyngeal exudate.  Eyes: Conjunctivae and EOM are normal. Pupils are equal, round, and reactive to light.  Neck: Neck supple. No JVD present. No tracheal deviation present.  Cardiovascular: Normal rate, regular rhythm and intact distal pulses.   Pulmonary/Chest: No respiratory distress. He has wheezes.  Shallow effort, wheezing in the right lower lung base. Exquisitely tender to palpation over the right lower chest. No flail chest segments, no crepitus or step-off noted.    Abdominal: Soft. Bowel sounds are normal. He exhibits no distension. There is no tenderness.  Musculoskeletal: Normal range of motion.  Neurological: He is alert and oriented to person, place, and time. GCS eye subscore is 4. GCS verbal subscore is 5. GCS motor subscore is 6.  Skin: Skin is warm and dry. He is not diaphoretic.  Nursing note and vitals reviewed.   ED Course  Procedures (including critical care time) Labs Review Labs Reviewed  ETHANOL  PROTIME-INR  CBC  DRUG SCREEN, URINE  COMPREHENSIVE METABOLIC PANEL  URINALYSIS, ROUTINE W REFLEX MICROSCOPIC  I-STAT CHEM 8, ED  SAMPLE TO BLOOD BANK    Imaging Review Dg Pelvis  Portable  07/21/2014   CLINICAL DATA:  [place] from roof; patient has pain with movement; patient states no pain in pelvic area but due to distance patient fell provider feels is necessary to be sure there is no pelvic injury  EXAM: PORTABLE PELVIS 1-2 VIEWS  COMPARISON:  None.  FINDINGS: No fracture. Hip joints are normally aligned. There is superior lateral hip joint space narrowing bilaterally with marginal osteophytes from the bases of femoral heads and superior acetabula.  No bone lesion. SI joints and  symphysis pubis are normally aligned. Soft tissues are unremarkable.  IMPRESSION: No fracture or acute finding.   Electronically Signed   By: Lajean Manes M.D.   On: 07/21/2014 14:41   Dg Chest Port 1 View  07/21/2014   CLINICAL DATA:  Right chest pain. Shortness of breath. Fell from roof.  EXAM: PORTABLE CHEST - 1 VIEW  COMPARISON:  04/15/2013  FINDINGS: The heart size and mediastinal contours are within normal limits. Both lungs are clear. The visualized skeletal structures are unremarkable.  IMPRESSION: No active disease.   Electronically Signed   By: Kathreen Devoid   On: 07/21/2014 14:41     EKG Interpretation   Date/Time:  Wednesday July 21 2014 13:40:42 EDT Ventricular Rate:  66 PR Interval:  183 QRS Duration: 82 QT Interval:  407 QTC Calculation: 426 R Axis:   -8 Text Interpretation:  Sinus rhythm When compared with ECG of 04/14/2013 No  significant change was found Confirmed by [facility]  MD, Nunzio Cory [phone])  on 07/21/2014 1:44:41 PM      MDM   Final diagnoses:  None   She here as a level II trauma. Low oxygen saturations prior to arrival with respiratory rate  and 29.  Patient work up pending. I have given report to Dr. Dina Rich who will assume care of the patient. He is stable during ED visit.  Margarita Mail, PA-C 07/22/14 [place], DO 07/23/14 2144

## 2014-07-21 NOTE — Progress Notes (Signed)
Chaplain responded to level two trauma. No family present but per nurse pt son has been contacted. Page chaplain should family arrive and need support.   07/21/14 1400  Clinical Encounter Type  Visited With Health care provider  Visit Type Trauma  [name], Epifanio Lesches 07/21/2014 2:33 PM

## 2014-07-21 NOTE — ED Notes (Signed)
Pt. Given gingerale.

## 2014-07-21 NOTE — ED Notes (Signed)
Patient transported to CT 

## 2014-07-21 NOTE — ED Notes (Signed)
Family updated as to patient's status on phone.

## 2014-07-21 NOTE — ED Provider Notes (Signed)
Patient signed out by Margarita Mail, PA pending CT scan.  Patient fell from a roof approximately 12 feet onto his right chest. Complaining of right-sided chest pain.  Chest x-ray negative and initial workup reassuring. CT scan obtained and shows a small 5% right-sided pneumothorax with 2 right sided rib fractures. On reassessment, patient continues to splint in pain. Oxygen saturations with nasal cannula in place 97%. Coarse breath sounds and poor inspiratory effort. Patient can still complaining of pain. Patient given IV Dilaudid. He is a smoker. He will need aggressive pulmonary toilet to prevent pneumonia. Will consult trauma given pneumothorax and concern for difficulty to control pain.  Results for orders placed or performed during the hospital encounter of 07/21/14  Ethanol  Result Value Ref Range   Alcohol, Ethyl (B) <5 0 - 9 mg/dL  Protime-INR  Result Value Ref Range   Prothrombin Time 13.4 11.6 - 15.2 seconds   INR 1.01 0.00 - 1.49  CBC  Result Value Ref Range   WBC 5.7 4.0 - 10.5 K/uL   RBC 5.17 4.22 - 5.81 MIL/uL   Hemoglobin 15.2 13.0 - 17.0 g/dL   HCT 45.0 39.0 - 52.0 %   MCV 87.0 78.0 - 100.0 fL   MCH 29.4 26.0 - 34.0 pg   MCHC 33.8 30.0 - 36.0 g/dL   RDW 14.6 11.5 - 15.5 %   Platelets 193 150 - 400 K/uL  Comprehensive metabolic panel  Result Value Ref Range   Sodium 142 135 - 145 mmol/L   Potassium 5.1 3.5 - 5.1 mmol/L   Chloride 112 96 - 112 mmol/L   CO2 25 19 - 32 mmol/L   Glucose, Bld 96 70 - 99 mg/dL   BUN 8 6 - 23 mg/dL   Creatinine, Ser 1.08 0.50 - 1.35 mg/dL   Calcium 9.0 8.4 - 10.5 mg/dL   Total Protein 6.3 6.0 - 8.3 g/dL   Albumin 3.7 3.5 - 5.2 g/dL   AST 29 0 - 37 U/L   ALT 21 0 - 53 U/L   Alkaline Phosphatase 59 39 - 117 U/L   Total Bilirubin 0.9 0.3 - 1.2 mg/dL   GFR calc non Af Amer 76 (L) >90 mL/min   GFR calc Af Amer 88 (L) >90 mL/min   Anion gap 5 5 - 15  Urinalysis, Routine w reflex microscopic  Result Value Ref Range   Color, Urine YELLOW  YELLOW   APPearance CLEAR CLEAR   Specific Gravity, Urine 1.021 1.005 - 1.030   pH 6.0 5.0 - 8.0   Glucose, UA NEGATIVE NEGATIVE mg/dL   Hgb urine dipstick NEGATIVE NEGATIVE   Bilirubin Urine NEGATIVE NEGATIVE   Ketones, ur NEGATIVE NEGATIVE mg/dL   Protein, ur NEGATIVE NEGATIVE mg/dL   Urobilinogen, UA 1.0 0.0 - 1.0 mg/dL   Nitrite NEGATIVE NEGATIVE   Leukocytes, UA NEGATIVE NEGATIVE  I-stat Chem 8, ED  Result Value Ref Range   Sodium 142 135 - 145 mmol/L   Potassium 4.7 3.5 - 5.1 mmol/L   Chloride 107 96 - 112 mmol/L   BUN 8 6 - 23 mg/dL   Creatinine, Ser 1.00 0.50 - 1.35 mg/dL   Glucose, Bld 94 70 - 99 mg/dL   Calcium, Ion 1.21 1.12 - 1.23 mmol/L   TCO2 21 0 - 100 mmol/L   Hemoglobin 17.0 13.0 - 17.0 g/dL   HCT 50.0 39.0 - 52.0 %  Sample to Blood Bank  Result Value Ref Range   Blood Bank Specimen SAMPLE AVAILABLE  FOR TESTING    Sample Expiration 07/22/2014    Ct Head Wo Contrast  07/21/2014   CLINICAL DATA:  Trauma. Fall 12 feet from a roof, landing on the right side. No loss of consciousness reported. Initial encounter.  EXAM: CT HEAD WITHOUT CONTRAST  CT CERVICAL SPINE WITHOUT CONTRAST  TECHNIQUE: Multidetector CT imaging of the head and cervical spine was performed following the standard protocol without intravenous contrast. Multiplanar CT image reconstructions of the cervical spine were also generated.  COMPARISON:  Brain MRI 04/15/2013. Head CT 04/14/2013. Cervical spine MRI 12/10/2007.  FINDINGS: CT HEAD FINDINGS  Small foci of subtle hypoattenuation in the cerebral white matter are compatible with mild chronic small vessel ischemic disease, better demonstrated on prior MRI. There is no evidence of acute cortical infarct, intracranial hemorrhage, mass, midline shift, or extra-axial fluid collection. Ventricles and sulci are within normal limits.  Orbits are unremarkable. Mastoid air cells and paranasal sinuses are clear. No skull fracture is identified.  CT CERVICAL SPINE  FINDINGS  There is straightening of the normal cervical lordosis. There is no significant listhesis. Prevertebral soft tissues are within normal limits. Mild disc space narrowing is present at C5-6 with moderate anterior endplate osteophytosis. Mild C6-7 anterolateral endplate spurring is also noted. No cervical spine fracture is identified. Paraspinal soft tissues are unremarkable.  IMPRESSION: 1. No evidence of acute intracranial abnormality. 2. No acute osseous abnormality identified in the cervical spine. Cervical spondylosis greatest at C5-6.   Electronically Signed   By: Logan Bores   On: 07/21/2014 16:14   Ct Chest W Contrast  07/21/2014   CLINICAL DATA:  [place] off roof from 12 foot height. Right-sided chest, abdominal, and pelvic pain. Decrease right-sided breath sounds on exam.  EXAM: CT CHEST, ABDOMEN, AND PELVIS WITH CONTRAST  TECHNIQUE: Multidetector CT imaging of the chest, abdomen and pelvis was performed following the standard protocol during bolus administration of intravenous contrast.  CONTRAST:  163mL OMNIPAQUE IOHEXOL 300 MG/ML  SOLN  COMPARISON:  None.  FINDINGS: CT CHEST FINDINGS  Mediastinum/Lymph Nodes: No evidence of thoracic aortic injury or mediastinal hematoma. No masses or pathologically enlarged lymph nodes identified.  Lungs/Pleura: No evidence of pulmonary contusion or other infiltrate. Mild dependent atelectasis is seen bilaterally. There is a tiny less than 5% right anterior pneumothorax. No evidence of hemothorax.  Musculoskeletal/Soft Tissues: Mildly displaced fractures of the right lateral fifth and sixth ribs are noted.  CT ABDOMEN AND PELVIS FINDINGS  Hepatobiliary: No masses or other significant abnormality identified. No evidence of parenchymal lacerations or contusions. Two tiny sub-cm right hepatic lobe cysts are noted. Gallbladder is unremarkable.  Pancreas: No mass, inflammatory changes, or other significant abnormality identified.  Spleen: Within normal limits in size  and appearance. No evidence of splenic laceration or contusion.  Adrenals:  No masses identified.  Kidneys/Urinary Tract: No evidence of masses or hydronephrosis. Small right renal cysts incidentally noted. No evidence of renal parenchymal injury.  Stomach/Bowel/Peritoneum: No evidence of wall thickening, mass, or obstruction. No evidence of hemoperitoneum. Diverticulosis is seen mainly involving the sigmoid colon, however there is no evidence of diverticulitis or other inflammatory process.  Vascular/Lymphatic: No pathologically enlarged lymph nodes identified. No evidence of abdominal aortic injury or retroperitoneal hemorrhage.  Reproductive:  No mass or other significant abnormality identified.  Other:  None.  Musculoskeletal:  No suspicious bone lesions identified.  IMPRESSION: Mildly displaced fractures of right lateral fifth and sixth ribs with tiny less than 5% right anterior pneumothorax.  No evidence of  aortic or other visceral injury. No evidence of hemothorax or hemoperitoneum.  Diverticulosis. No radiographic evidence of diverticulitis.   Electronically Signed   By: Earle Gell M.D.   On: 07/21/2014 16:28   Ct Cervical Spine Wo Contrast  07/21/2014   CLINICAL DATA:  Trauma. Fall 12 feet from a roof, landing on the right side. No loss of consciousness reported. Initial encounter.  EXAM: CT HEAD WITHOUT CONTRAST  CT CERVICAL SPINE WITHOUT CONTRAST  TECHNIQUE: Multidetector CT imaging of the head and cervical spine was performed following the standard protocol without intravenous contrast. Multiplanar CT image reconstructions of the cervical spine were also generated.  COMPARISON:  Brain MRI 04/15/2013. Head CT 04/14/2013. Cervical spine MRI 12/10/2007.  FINDINGS: CT HEAD FINDINGS  Small foci of subtle hypoattenuation in the cerebral white matter are compatible with mild chronic small vessel ischemic disease, better demonstrated on prior MRI. There is no evidence of acute cortical infarct, intracranial  hemorrhage, mass, midline shift, or extra-axial fluid collection. Ventricles and sulci are within normal limits.  Orbits are unremarkable. Mastoid air cells and paranasal sinuses are clear. No skull fracture is identified.  CT CERVICAL SPINE FINDINGS  There is straightening of the normal cervical lordosis. There is no significant listhesis. Prevertebral soft tissues are within normal limits. Mild disc space narrowing is present at C5-6 with moderate anterior endplate osteophytosis. Mild C6-7 anterolateral endplate spurring is also noted. No cervical spine fracture is identified. Paraspinal soft tissues are unremarkable.  IMPRESSION: 1. No evidence of acute intracranial abnormality. 2. No acute osseous abnormality identified in the cervical spine. Cervical spondylosis greatest at C5-6.   Electronically Signed   By: Logan Bores   On: 07/21/2014 16:14   Ct Abdomen Pelvis W Contrast  07/21/2014   CLINICAL DATA:  [place] off roof from 12 foot height. Right-sided chest, abdominal, and pelvic pain. Decrease right-sided breath sounds on exam.  EXAM: CT CHEST, ABDOMEN, AND PELVIS WITH CONTRAST  TECHNIQUE: Multidetector CT imaging of the chest, abdomen and pelvis was performed following the standard protocol during bolus administration of intravenous contrast.  CONTRAST:  149mL OMNIPAQUE IOHEXOL 300 MG/ML  SOLN  COMPARISON:  None.  FINDINGS: CT CHEST FINDINGS  Mediastinum/Lymph Nodes: No evidence of thoracic aortic injury or mediastinal hematoma. No masses or pathologically enlarged lymph nodes identified.  Lungs/Pleura: No evidence of pulmonary contusion or other infiltrate. Mild dependent atelectasis is seen bilaterally. There is a tiny less than 5% right anterior pneumothorax. No evidence of hemothorax.  Musculoskeletal/Soft Tissues: Mildly displaced fractures of the right lateral fifth and sixth ribs are noted.  CT ABDOMEN AND PELVIS FINDINGS  Hepatobiliary: No masses or other significant abnormality identified. No  evidence of parenchymal lacerations or contusions. Two tiny sub-cm right hepatic lobe cysts are noted. Gallbladder is unremarkable.  Pancreas: No mass, inflammatory changes, or other significant abnormality identified.  Spleen: Within normal limits in size and appearance. No evidence of splenic laceration or contusion.  Adrenals:  No masses identified.  Kidneys/Urinary Tract: No evidence of masses or hydronephrosis. Small right renal cysts incidentally noted. No evidence of renal parenchymal injury.  Stomach/Bowel/Peritoneum: No evidence of wall thickening, mass, or obstruction. No evidence of hemoperitoneum. Diverticulosis is seen mainly involving the sigmoid colon, however there is no evidence of diverticulitis or other inflammatory process.  Vascular/Lymphatic: No pathologically enlarged lymph nodes identified. No evidence of abdominal aortic injury or retroperitoneal hemorrhage.  Reproductive:  No mass or other significant abnormality identified.  Other:  None.  Musculoskeletal:  No suspicious  bone lesions identified.  IMPRESSION: Mildly displaced fractures of right lateral fifth and sixth ribs with tiny less than 5% right anterior pneumothorax.  No evidence of aortic or other visceral injury. No evidence of hemothorax or hemoperitoneum.  Diverticulosis. No radiographic evidence of diverticulitis.   Electronically Signed   By: Earle Gell M.D.   On: 07/21/2014 16:28   Dg Pelvis Portable  07/21/2014   CLINICAL DATA:  [place] from roof; patient has pain with movement; patient states no pain in pelvic area but due to distance patient fell provider feels is necessary to be sure there is no pelvic injury  EXAM: PORTABLE PELVIS 1-2 VIEWS  COMPARISON:  None.  FINDINGS: No fracture. Hip joints are normally aligned. There is superior lateral hip joint space narrowing bilaterally with marginal osteophytes from the bases of femoral heads and superior acetabula.  No bone lesion. SI joints and symphysis pubis are normally  aligned. Soft tissues are unremarkable.  IMPRESSION: No fracture or acute finding.   Electronically Signed   By: Lajean Manes M.D.   On: 07/21/2014 14:41   Dg Chest Port 1 View  07/21/2014   CLINICAL DATA:  Right chest pain. Shortness of breath. Fell from roof.  EXAM: PORTABLE CHEST - 1 VIEW  COMPARISON:  04/15/2013  FINDINGS: The heart size and mediastinal contours are within normal limits. Both lungs are clear. The visualized skeletal structures are unremarkable.  IMPRESSION: No active disease.   Electronically Signed   By: Kathreen Devoid   On: 07/21/2014 14:41      Merryl Hacker, MD 07/21/14 [id number]

## 2014-07-21 NOTE — H&P (Signed)
History   [name] is an 54 y.o. male.   Chief Complaint:  Chief Complaint  Patient presents with  . Fall    Fall This is a new problem. The current episode started today. Associated symptoms include chest pain.  Trauma Mechanism of injury: fall Injury location: torso Injury location detail: R chest Incident location: home Time since incident: 2 hours Arrived directly from scene: yes   Fall:      Fall occurred: from a roof      Height of fall: 12      Impact surface: dirt      Point of impact: front      Entrapped after fall: no  Protective equipment:       None      Suspicion of alcohol use: no      Suspicion of drug use: no  EMS/PTA data:      Bystander interventions: none      Ambulatory at scene: no      Blood loss: none      Responsiveness: alert      Oriented to: person, place, situation and time      Amnesic to event: no      Airway interventions: none      IV access: established      IO access: none      Fluids administered: normal saline      Cardiac interventions: none      Medications administered: none      Immobilization: C-collar and long board      Airway condition since incident: stable      Breathing condition since incident: stable      Circulation condition since incident: stable      Mental status condition since incident: stable      Disability condition since incident: stable  Current symptoms:      Pain scale: 4/10      Associated symptoms:            Reports chest pain.   Relevant PMH:      Medical risk factors:            Smoker   Past Medical History  Diagnosis Date  . Hypertension     Past Surgical History  Procedure Laterality Date  . Hip surgery    . Knee surgery    . Cardiac catheterization      Family History  Problem Relation Age of Onset  . CAD Father   . Bone cancer Mother    Social History:  reports that he has been smoking.  He has never used smokeless tobacco. He reports that he drinks  alcohol. He reports that he does not use illicit drugs.  Allergies  No Known Allergies  Home Medications   (Not in a hospital admission)  Trauma Course   Results for orders placed or performed during the hospital encounter of 07/21/14 (from the past 48 hour(s))  Ethanol     Status: None   Collection Time: 07/21/14  2:15 PM  Result Value Ref Range   Alcohol, Ethyl (B) <5 0 - 9 mg/dL    Comment:        LOWEST DETECTABLE LIMIT FOR SERUM ALCOHOL IS 11 mg/dL FOR MEDICAL PURPOSES ONLY   Protime-INR     Status: None   Collection Time: 07/21/14  2:15 PM  Result Value Ref Range   Prothrombin Time 13.4 11.6 - 15.2 seconds   INR 1.01 0.00 - 1.49  Sample  to Blood Bank     Status: None   Collection Time: 07/21/14  2:15 PM  Result Value Ref Range   Blood Bank Specimen SAMPLE AVAILABLE FOR TESTING    Sample Expiration 07/22/2014   CBC     Status: None   Collection Time: 07/21/14  2:15 PM  Result Value Ref Range   WBC 5.7 4.0 - 10.5 K/uL   RBC 5.17 4.22 - 5.81 MIL/uL   Hemoglobin 15.2 13.0 - 17.0 g/dL   HCT 45.0 39.0 - 52.0 %   MCV 87.0 78.0 - 100.0 fL   MCH 29.4 26.0 - 34.0 pg   MCHC 33.8 30.0 - 36.0 g/dL   RDW 14.6 11.5 - 15.5 %   Platelets 193 150 - 400 K/uL  Comprehensive metabolic panel     Status: Abnormal   Collection Time: 07/21/14  2:15 PM  Result Value Ref Range   Sodium 142 135 - 145 mmol/L   Potassium 5.1 3.5 - 5.1 mmol/L   Chloride 112 96 - 112 mmol/L   CO2 25 19 - 32 mmol/L   Glucose, Bld 96 70 - 99 mg/dL   BUN 8 6 - 23 mg/dL   Creatinine, Ser 1.08 0.50 - 1.35 mg/dL   Calcium 9.0 8.4 - 10.5 mg/dL   Total Protein 6.3 6.0 - 8.3 g/dL   Albumin 3.7 3.5 - 5.2 g/dL   AST 29 0 - 37 U/L   ALT 21 0 - 53 U/L   Alkaline Phosphatase 59 39 - 117 U/L   Total Bilirubin 0.9 0.3 - 1.2 mg/dL   GFR calc non Af Amer 76 (L) >90 mL/min   GFR calc Af Amer 88 (L) >90 mL/min    Comment: (NOTE) The eGFR has been calculated using the CKD EPI equation. This calculation has not been  validated in all clinical situations. eGFR's persistently <90 mL/min signify possible Chronic Kidney Disease.    Anion gap 5 5 - 15  I-stat Chem 8, ED     Status: None   Collection Time: 07/21/14  2:30 PM  Result Value Ref Range   Sodium 142 135 - 145 mmol/L   Potassium 4.7 3.5 - 5.1 mmol/L   Chloride 107 96 - 112 mmol/L   BUN 8 6 - 23 mg/dL   Creatinine, Ser 1.00 0.50 - 1.35 mg/dL   Glucose, Bld 94 70 - 99 mg/dL   Calcium, Ion 1.21 1.12 - 1.23 mmol/L   TCO2 21 0 - 100 mmol/L   Hemoglobin 17.0 13.0 - 17.0 g/dL   HCT 50.0 39.0 - 52.0 %  Urinalysis, Routine w reflex microscopic     Status: None   Collection Time: 07/21/14  3:14 PM  Result Value Ref Range   Color, Urine YELLOW YELLOW   APPearance CLEAR CLEAR   Specific Gravity, Urine 1.021 1.005 - 1.030   pH 6.0 5.0 - 8.0   Glucose, UA NEGATIVE NEGATIVE mg/dL   Hgb urine dipstick NEGATIVE NEGATIVE   Bilirubin Urine NEGATIVE NEGATIVE   Ketones, ur NEGATIVE NEGATIVE mg/dL   Protein, ur NEGATIVE NEGATIVE mg/dL   Urobilinogen, UA 1.0 0.0 - 1.0 mg/dL   Nitrite NEGATIVE NEGATIVE   Leukocytes, UA NEGATIVE NEGATIVE    Comment: MICROSCOPIC NOT DONE ON URINES WITH NEGATIVE PROTEIN, BLOOD, LEUKOCYTES, NITRITE, OR GLUCOSE <1000 mg/dL.   Ct Head Wo Contrast  07/21/2014   CLINICAL DATA:  Trauma. Fall 12 feet from a roof, landing on the right side. No loss of consciousness reported. Initial encounter.  EXAM: CT HEAD WITHOUT CONTRAST  CT CERVICAL SPINE WITHOUT CONTRAST  TECHNIQUE: Multidetector CT imaging of the head and cervical spine was performed following the standard protocol without intravenous contrast. Multiplanar CT image reconstructions of the cervical spine were also generated.  COMPARISON:  Brain MRI 04/15/2013. Head CT 04/14/2013. Cervical spine MRI 12/10/2007.  FINDINGS: CT HEAD FINDINGS  Small foci of subtle hypoattenuation in the cerebral white matter are compatible with mild chronic small vessel ischemic disease, better demonstrated  on prior MRI. There is no evidence of acute cortical infarct, intracranial hemorrhage, mass, midline shift, or extra-axial fluid collection. Ventricles and sulci are within normal limits.  Orbits are unremarkable. Mastoid air cells and paranasal sinuses are clear. No skull fracture is identified.  CT CERVICAL SPINE FINDINGS  There is straightening of the normal cervical lordosis. There is no significant listhesis. Prevertebral soft tissues are within normal limits. Mild disc space narrowing is present at C5-6 with moderate anterior endplate osteophytosis. Mild C6-7 anterolateral endplate spurring is also noted. No cervical spine fracture is identified. Paraspinal soft tissues are unremarkable.  IMPRESSION: 1. No evidence of acute intracranial abnormality. 2. No acute osseous abnormality identified in the cervical spine. Cervical spondylosis greatest at C5-6.   Electronically Signed   By: Logan Bores   On: 07/21/2014 16:14   Ct Chest W Contrast  07/21/2014   CLINICAL DATA:  [place] off roof from 12 foot height. Right-sided chest, abdominal, and pelvic pain. Decrease right-sided breath sounds on exam.  EXAM: CT CHEST, ABDOMEN, AND PELVIS WITH CONTRAST  TECHNIQUE: Multidetector CT imaging of the chest, abdomen and pelvis was performed following the standard protocol during bolus administration of intravenous contrast.  CONTRAST:  159m OMNIPAQUE IOHEXOL 300 MG/ML  SOLN  COMPARISON:  None.  FINDINGS: CT CHEST FINDINGS  Mediastinum/Lymph Nodes: No evidence of thoracic aortic injury or mediastinal hematoma. No masses or pathologically enlarged lymph nodes identified.  Lungs/Pleura: No evidence of pulmonary contusion or other infiltrate. Mild dependent atelectasis is seen bilaterally. There is a tiny less than 5% right anterior pneumothorax. No evidence of hemothorax.  Musculoskeletal/Soft Tissues: Mildly displaced fractures of the right lateral fifth and sixth ribs are noted.  CT ABDOMEN AND PELVIS FINDINGS   Hepatobiliary: No masses or other significant abnormality identified. No evidence of parenchymal lacerations or contusions. Two tiny sub-cm right hepatic lobe cysts are noted. Gallbladder is unremarkable.  Pancreas: No mass, inflammatory changes, or other significant abnormality identified.  Spleen: Within normal limits in size and appearance. No evidence of splenic laceration or contusion.  Adrenals:  No masses identified.  Kidneys/Urinary Tract: No evidence of masses or hydronephrosis. Small right renal cysts incidentally noted. No evidence of renal parenchymal injury.  Stomach/Bowel/Peritoneum: No evidence of wall thickening, mass, or obstruction. No evidence of hemoperitoneum. Diverticulosis is seen mainly involving the sigmoid colon, however there is no evidence of diverticulitis or other inflammatory process.  Vascular/Lymphatic: No pathologically enlarged lymph nodes identified. No evidence of abdominal aortic injury or retroperitoneal hemorrhage.  Reproductive:  No mass or other significant abnormality identified.  Other:  None.  Musculoskeletal:  No suspicious bone lesions identified.  IMPRESSION: Mildly displaced fractures of right lateral fifth and sixth ribs with tiny less than 5% right anterior pneumothorax.  No evidence of aortic or other visceral injury. No evidence of hemothorax or hemoperitoneum.  Diverticulosis. No radiographic evidence of diverticulitis.   Electronically Signed   By: JEarle GellM.D.   On: 07/21/2014 16:28   Ct Cervical Spine Wo Contrast  07/21/2014   CLINICAL DATA:  Trauma. Fall 12 feet from a roof, landing on the right side. No loss of consciousness reported. Initial encounter.  EXAM: CT HEAD WITHOUT CONTRAST  CT CERVICAL SPINE WITHOUT CONTRAST  TECHNIQUE: Multidetector CT imaging of the head and cervical spine was performed following the standard protocol without intravenous contrast. Multiplanar CT image reconstructions of the cervical spine were also generated.   COMPARISON:  Brain MRI 04/15/2013. Head CT 04/14/2013. Cervical spine MRI 12/10/2007.  FINDINGS: CT HEAD FINDINGS  Small foci of subtle hypoattenuation in the cerebral white matter are compatible with mild chronic small vessel ischemic disease, better demonstrated on prior MRI. There is no evidence of acute cortical infarct, intracranial hemorrhage, mass, midline shift, or extra-axial fluid collection. Ventricles and sulci are within normal limits.  Orbits are unremarkable. Mastoid air cells and paranasal sinuses are clear. No skull fracture is identified.  CT CERVICAL SPINE FINDINGS  There is straightening of the normal cervical lordosis. There is no significant listhesis. Prevertebral soft tissues are within normal limits. Mild disc space narrowing is present at C5-6 with moderate anterior endplate osteophytosis. Mild C6-7 anterolateral endplate spurring is also noted. No cervical spine fracture is identified. Paraspinal soft tissues are unremarkable.  IMPRESSION: 1. No evidence of acute intracranial abnormality. 2. No acute osseous abnormality identified in the cervical spine. Cervical spondylosis greatest at C5-6.   Electronically Signed   By: Logan Bores   On: 07/21/2014 16:14   Ct Abdomen Pelvis W Contrast  07/21/2014   CLINICAL DATA:  [place] off roof from 12 foot height. Right-sided chest, abdominal, and pelvic pain. Decrease right-sided breath sounds on exam.  EXAM: CT CHEST, ABDOMEN, AND PELVIS WITH CONTRAST  TECHNIQUE: Multidetector CT imaging of the chest, abdomen and pelvis was performed following the standard protocol during bolus administration of intravenous contrast.  CONTRAST:  134m OMNIPAQUE IOHEXOL 300 MG/ML  SOLN  COMPARISON:  None.  FINDINGS: CT CHEST FINDINGS  Mediastinum/Lymph Nodes: No evidence of thoracic aortic injury or mediastinal hematoma. No masses or pathologically enlarged lymph nodes identified.  Lungs/Pleura: No evidence of pulmonary contusion or other infiltrate. Mild dependent  atelectasis is seen bilaterally. There is a tiny less than 5% right anterior pneumothorax. No evidence of hemothorax.  Musculoskeletal/Soft Tissues: Mildly displaced fractures of the right lateral fifth and sixth ribs are noted.  CT ABDOMEN AND PELVIS FINDINGS  Hepatobiliary: No masses or other significant abnormality identified. No evidence of parenchymal lacerations or contusions. Two tiny sub-cm right hepatic lobe cysts are noted. Gallbladder is unremarkable.  Pancreas: No mass, inflammatory changes, or other significant abnormality identified.  Spleen: Within normal limits in size and appearance. No evidence of splenic laceration or contusion.  Adrenals:  No masses identified.  Kidneys/Urinary Tract: No evidence of masses or hydronephrosis. Small right renal cysts incidentally noted. No evidence of renal parenchymal injury.  Stomach/Bowel/Peritoneum: No evidence of wall thickening, mass, or obstruction. No evidence of hemoperitoneum. Diverticulosis is seen mainly involving the sigmoid colon, however there is no evidence of diverticulitis or other inflammatory process.  Vascular/Lymphatic: No pathologically enlarged lymph nodes identified. No evidence of abdominal aortic injury or retroperitoneal hemorrhage.  Reproductive:  No mass or other significant abnormality identified.  Other:  None.  Musculoskeletal:  No suspicious bone lesions identified.  IMPRESSION: Mildly displaced fractures of right lateral fifth and sixth ribs with tiny less than 5% right anterior pneumothorax.  No evidence of aortic or other visceral injury. No evidence of hemothorax or hemoperitoneum.  Diverticulosis. No radiographic  evidence of diverticulitis.   Electronically Signed   By: Earle Gell M.D.   On: 07/21/2014 16:28   Dg Pelvis Portable  07/21/2014   CLINICAL DATA:  [place] from roof; patient has pain with movement; patient states no pain in pelvic area but due to distance patient fell provider feels is necessary to be sure there is  no pelvic injury  EXAM: PORTABLE PELVIS 1-2 VIEWS  COMPARISON:  None.  FINDINGS: No fracture. Hip joints are normally aligned. There is superior lateral hip joint space narrowing bilaterally with marginal osteophytes from the bases of femoral heads and superior acetabula.  No bone lesion. SI joints and symphysis pubis are normally aligned. Soft tissues are unremarkable.  IMPRESSION: No fracture or acute finding.   Electronically Signed   By: Lajean Manes M.D.   On: 07/21/2014 14:41   Dg Chest Port 1 View  07/21/2014   CLINICAL DATA:  Right chest pain. Shortness of breath. Fell from roof.  EXAM: PORTABLE CHEST - 1 VIEW  COMPARISON:  04/15/2013  FINDINGS: The heart size and mediastinal contours are within normal limits. Both lungs are clear. The visualized skeletal structures are unremarkable.  IMPRESSION: No active disease.   Electronically Signed   By: Kathreen Devoid   On: 07/21/2014 14:41    Review of Systems  Cardiovascular: Positive for chest pain.    Blood pressure 141/73, pulse 73, temperature 98.1 F (36.7 C), temperature source Oral, resp. rate 23, height 6' 2"  (1.88 m), weight 117.935 kg (260 lb), SpO2 96 %. Physical Exam  Constitutional: He is oriented to person, place, and time. He appears well-developed and well-nourished.  HENT:  Head: Normocephalic and atraumatic.  Eyes: EOM are normal. Pupils are equal, round, and reactive to light.  Eyes blood shot   Neck: Normal range of motion. Neck supple.  Cardiovascular: Normal rate, regular rhythm, normal heart sounds and intact distal pulses.   Respiratory: Tachypnea noted. He is in respiratory distress (mild). He exhibits tenderness. He exhibits no crepitus.    GI: Soft. Bowel sounds are normal.  Musculoskeletal: Normal range of motion.  Neurological: He is alert and oriented to person, place, and time.  Skin: Skin is warm and dry.  Psychiatric: He has a normal mood and affect. His behavior is normal. Judgment and thought content  normal.     Assessment/Plan Fall off roof Right rib fractures x 2 with small associated PTX Oxygenating well  Admit for pain control Repeat CXR in the AM  Jaylise Peek 07/21/2014, 5:37 PM   Procedures

## 2014-07-21 NOTE — ED Notes (Signed)
Xray tech to call for backup because pt. Needs pain meds prior to exam.

## 2014-07-21 NOTE — ED Notes (Signed)
Attempted report to floor.  

## 2014-07-22 ENCOUNTER — Observation Stay (HOSPITAL_COMMUNITY): Payer: Medicaid Other

## 2014-07-22 DIAGNOSIS — J939 Pneumothorax, unspecified: Secondary | ICD-10-CM | POA: Diagnosis present

## 2014-07-22 DIAGNOSIS — F17210 Nicotine dependence, cigarettes, uncomplicated: Secondary | ICD-10-CM | POA: Diagnosis present

## 2014-07-22 DIAGNOSIS — R079 Chest pain, unspecified: Secondary | ICD-10-CM | POA: Diagnosis present

## 2014-07-22 DIAGNOSIS — S2241XA Multiple fractures of ribs, right side, initial encounter for closed fracture: Secondary | ICD-10-CM | POA: Diagnosis present

## 2014-07-22 DIAGNOSIS — S270XXA Traumatic pneumothorax, initial encounter: Secondary | ICD-10-CM | POA: Diagnosis present

## 2014-07-22 DIAGNOSIS — W19XXXA Unspecified fall, initial encounter: Secondary | ICD-10-CM | POA: Diagnosis present

## 2014-07-22 DIAGNOSIS — I10 Essential (primary) hypertension: Secondary | ICD-10-CM | POA: Diagnosis present

## 2014-07-22 DIAGNOSIS — W132XXA Fall from, out of or through roof, initial encounter: Secondary | ICD-10-CM | POA: Diagnosis present

## 2014-07-22 LAB — BASIC METABOLIC PANEL
ANION GAP: 8 (ref 5–15)
BUN: 5 mg/dL — AB (ref 6–23)
CHLORIDE: 106 mmol/L (ref 96–112)
CO2: 25 mmol/L (ref 19–32)
Calcium: 8.7 mg/dL (ref 8.4–10.5)
Creatinine, Ser: 1.06 mg/dL (ref 0.50–1.35)
GFR calc Af Amer: >90 mL/min (ref >90)
GFR calc non Af Amer: 78 mL/min — ABNORMAL LOW (ref >90)
Glucose, Bld: 98 mg/dL (ref 70–99)
POTASSIUM: 4.1 mmol/L (ref 3.5–5.1)
Sodium: 139 mmol/L (ref 135–145)

## 2014-07-22 LAB — CBC
HCT: 44.5 % (ref 39.0–52.0)
Hemoglobin: 14.6 g/dL (ref 13.0–17.0)
MCH: 28.9 pg (ref 26.0–34.0)
MCHC: 32.8 g/dL (ref 30.0–36.0)
MCV: 88.1 fL (ref 78.0–100.0)
Platelets: 214 10*3/uL (ref 150–400)
RBC: 5.05 MIL/uL (ref 4.22–5.81)
RDW: 15.2 % (ref 11.5–15.5)
WBC: 7.5 10*3/uL (ref 4.0–10.5)

## 2014-07-22 MED ORDER — NAPROXEN 250 MG PO TABS
500.0000 mg | ORAL_TABLET | Freq: Two times a day (BID) | ORAL | Status: DC
  Administered 2014-07-22 – 2014-07-25 (×6): 500 mg via ORAL
  Filled 2014-07-22 (×6): qty 2

## 2014-07-22 MED ORDER — OXYCODONE HCL 5 MG PO TABS
10.0000 mg | ORAL_TABLET | ORAL | Status: DC | PRN
  Administered 2014-07-22 – 2014-07-24 (×6): 20 mg via ORAL
  Administered 2014-07-24: 10 mg via ORAL
  Administered 2014-07-24 – 2014-07-25 (×3): 20 mg via ORAL
  Filled 2014-07-22 (×4): qty 4
  Filled 2014-07-22 (×2): qty 2
  Filled 2014-07-22 (×3): qty 4
  Filled 2014-07-22: qty 2
  Filled 2014-07-22: qty 4

## 2014-07-22 MED ORDER — LIDOCAINE-EPINEPHRINE 1 %-1:100000 IJ SOLN
10.0000 mL | Freq: Once | INTRAMUSCULAR | Status: DC
  Filled 2014-07-22: qty 10

## 2014-07-22 MED ORDER — FENTANYL CITRATE (PF) 100 MCG/2ML IJ SOLN
100.0000 ug | Freq: Once | INTRAMUSCULAR | Status: AC
  Administered 2014-07-22: 100 ug via INTRAVENOUS

## 2014-07-22 MED ORDER — TRAMADOL HCL 50 MG PO TABS
100.0000 mg | ORAL_TABLET | Freq: Four times a day (QID) | ORAL | Status: DC
  Administered 2014-07-22 – 2014-07-23 (×3): 100 mg via ORAL
  Filled 2014-07-22 (×3): qty 2

## 2014-07-22 MED ORDER — MIDAZOLAM HCL 2 MG/2ML IJ SOLN
INTRAMUSCULAR | Status: AC
  Filled 2014-07-22: qty 4

## 2014-07-22 MED ORDER — ALBUTEROL SULFATE (2.5 MG/3ML) 0.083% IN NEBU
2.5000 mg | INHALATION_SOLUTION | Freq: Once | RESPIRATORY_TRACT | Status: AC
  Administered 2014-07-22: 2.5 mg via RESPIRATORY_TRACT

## 2014-07-22 MED ORDER — OXYCODONE HCL 5 MG PO TABS
5.0000 mg | ORAL_TABLET | ORAL | Status: DC | PRN
  Administered 2014-07-22: 15 mg via ORAL
  Filled 2014-07-22: qty 3

## 2014-07-22 MED ORDER — IPRATROPIUM-ALBUTEROL 0.5-2.5 (3) MG/3ML IN SOLN
3.0000 mL | RESPIRATORY_TRACT | Status: DC
  Administered 2014-07-22 – 2014-07-24 (×12): 3 mL via RESPIRATORY_TRACT
  Filled 2014-07-22 (×11): qty 3
  Filled 2014-07-22: qty 33

## 2014-07-22 MED ORDER — MIDAZOLAM HCL 2 MG/2ML IJ SOLN
4.0000 mg | Freq: Once | INTRAMUSCULAR | Status: AC
  Administered 2014-07-22: 2 mg via INTRAVENOUS

## 2014-07-22 MED ORDER — POLYETHYLENE GLYCOL 3350 17 G PO PACK
17.0000 g | PACK | Freq: Every day | ORAL | Status: DC
  Administered 2014-07-22 – 2014-07-25 (×4): 17 g via ORAL
  Filled 2014-07-22 (×4): qty 1

## 2014-07-22 MED ORDER — ENOXAPARIN SODIUM 30 MG/0.3ML ~~LOC~~ SOLN
30.0000 mg | Freq: Two times a day (BID) | SUBCUTANEOUS | Status: DC
  Administered 2014-07-22 – 2014-07-25 (×6): 30 mg via SUBCUTANEOUS
  Filled 2014-07-22 (×6): qty 0.3

## 2014-07-22 MED ORDER — ALBUTEROL SULFATE (2.5 MG/3ML) 0.083% IN NEBU
INHALATION_SOLUTION | RESPIRATORY_TRACT | Status: AC
  Filled 2014-07-22: qty 3

## 2014-07-22 MED ORDER — HYDROMORPHONE HCL 1 MG/ML IJ SOLN
0.5000 mg | INTRAMUSCULAR | Status: DC | PRN
  Administered 2014-07-22 – 2014-07-24 (×7): 0.5 mg via INTRAVENOUS
  Filled 2014-07-22 (×7): qty 1

## 2014-07-22 MED ORDER — FENTANYL CITRATE (PF) 100 MCG/2ML IJ SOLN
INTRAMUSCULAR | Status: AC
  Filled 2014-07-22: qty 2

## 2014-07-22 MED ORDER — LIDOCAINE HCL (CARDIAC) 20 MG/ML IV SOLN
INTRAVENOUS | Status: AC
  Filled 2014-07-22: qty 5

## 2014-07-22 NOTE — Progress Notes (Signed)
Patient ID: [name], male   DOB: October 04, 1960, 54 y.o.   MRN: [id number] Increased WOB and wheezing. CXR with increased R PTX. Will proceed with R CT placement this AM and monitor closely. Georganna Skeans, MD, MPH, FACS Trauma: [phone] General Surgery: [phone]

## 2014-07-22 NOTE — Progress Notes (Signed)
Rapid response came and assessed pt, patient at that time had audible wheezing noted , albuterol neb treatment given and port cxr obtained. CCS PA in to see patient.Patient back in bed on 02 at 4l per N/C , sats 97%, patient resp more even and unlabored, but patient still c/o hard to take a deep breath. PA informed patient of need for a chest tube due to worsening lung collapse. [facility] [employer]

## 2014-07-22 NOTE — Progress Notes (Signed)
Patient ID: [name], male   DOB: December 18, 1960, 54 y.o.   MRN: [id number]  LOS: 2 days  Subjective: Pt with worsened SOB this morning   Objective: Vital signs in last 24 hours: Temp:  [98.1 F (36.7 C)-98.5 F (36.9 C)] 98.5 F (36.9 C) (04/28 0521) Pulse Rate:  [61-73] 71 (04/28 0521) Resp:  [14-29] 18 (04/28 0521) BP: (98-153)/(69-91) 129/69 mmHg (04/28 0521) SpO2:  [92 %-99 %] 96 % (04/28 0644) Weight:  [117.935 kg (260 lb)-122.653 kg (270 lb 6.4 oz)] 122.653 kg (270 lb 6.4 oz) (04/27 1900)    Laboratory  CBC  Recent Labs  07/21/14 1415 07/21/14 1430 07/22/14 0421  WBC 5.7  --  7.5  HGB 15.2 17.0 14.6  HCT 45.0 50.0 44.5  PLT 193  --  214   BMET  Recent Labs  07/21/14 1415 07/21/14 1430 07/22/14 0421  NA 142 142 139  K 5.1 4.7 4.1  CL 112 107 106  CO2 25  --  25  GLUCOSE 96 94 98  BUN 8 8 5*  CREATININE 1.08 1.00 1.06  CALCIUM 9.0  --  8.7    Physical Exam General appearance: alert and moderate distress Resp: rhonchi bilaterally Cardio: Mild tachycardia   Assessment/Plan: Fall Right rib fxs w/HPTX -- CT now FEN -- No issues VTE -- SCD's, Lovenox (increase for weight) Dispo -- CT, PT eval    Lisette Abu, PA-C Pager: [phone] General Trauma PA Pager: [phone]  07/22/2014

## 2014-07-22 NOTE — Progress Notes (Signed)
UR completed 

## 2014-07-22 NOTE — Significant Event (Signed)
Rapid Response Event Note  Overview: Time Called: 2423 Arrival Time: 0630 Event Type: Respiratory  Initial Focused Assessment:  Called by Tracie Harrier, RN 6 N that pt had gotten out of bed to void and developed sudden onset SOB with RA sats 92%.  Placed on 4l [place] with sats improved to 97%  .  Upon my arrival Pt was sitting up in bed in obvious distress, only able to speak a few words at a time, increased WOB .  States he cannot take a deep breath, hurts with increased inspirations.  RR 32 with audible expiratory wheezing, rhonchi in bilat upper fields, decreased air movement in bilateral bases.  Using abdominal muscles to breath.   152/85  HRReg 102,  T 37.3 c   Interventions:  Stat port CXR, Albuterol 2.5 neb,    Seen at bedside  At 0710 by Hilbert Odor, PA who informed pt of increase in pneumothorax  and need for insertion of right  CT                             Dr Hulen Skains at bedside at [facility].  Continues with Expiratory wheezing and increased WOB   0830  Informed consent obtained from pt for insertion of RIght CT, time out identified pt and procedure, #28 Fr  Right Chest tube inserted by Hilbert Odor, PA into 4th rib space after pt was given Versed 2 mg and Fentanyl 100 mg IV.  Cardiiac monitoring and O2 sats were monitored throughout the procedure. See vital sign record.  Bilateral BS auscultated.  CXR pending.  CT place to 20 cm suction, no air leak noted  0845  Hand off report given to Baxter Flattery, RN 6N. She will wean o2 as tolerated.  Will follow as needed.    Event Summary: Name of Physician Notified: Dr Hulen Skains at 0700    at    Outcome: Stayed in room and stabalized     Nakesha Ebrahim, Gust Brooms

## 2014-07-22 NOTE — Progress Notes (Signed)
PT Cancellation Note  Patient Details Name: [name] MRN: [id number] DOB: [birth year]   Cancelled Treatment:    Reason Eval/Treat Not Completed: Pain limiting ability to participate Patient remains in a significant amount of pain despite being given pain medication. Requests PT be held at this time. Will follow up as schedule allows, likely tomorrow for PT evaluation.  [name] 07/22/2014, 2:51 PM Camille Bal [place], [place]

## 2014-07-22 NOTE — Progress Notes (Signed)
Pt stood up at side of bed to urinate and became very shob, o2 sats 92 on r/a.oxygen placed at 4 l/ncsats up to 97% ,lung sounds with coarse rhonchi and crackles noted throughtout , pt stated it is hard to breathe, rapid response notifed and Dr. Hulen Skains paged. Patient has urinated once tonight and does not feel he needs to urinate anymore at this time.

## 2014-07-22 NOTE — Procedures (Signed)
Chest Tube Insertion Procedure Note  Indications:  Clinically significant Pneumothorax  Pre-operative Diagnosis: Pneumothorax  Post-operative Diagnosis: Pneumothorax  Procedure Details  Informed consent was obtained for the procedure, including sedation.  Risks of lung perforation, hemorrhage, arrhythmia, and adverse drug reaction were discussed.   After sterile skin prep, using standard technique, a 28 French tube was placed in the right anterior 4th rib space.  Findings: None  Estimated Blood Loss:  less than 50 mL         Specimens:  None              Complications:  None         Disposition: Floor: RR with pt         Condition: stable  Attending Attestation: I performed the procedure.    Lisette Abu, PA-C Pager: [phone] General Trauma PA Pager: [phone]

## 2014-07-23 ENCOUNTER — Inpatient Hospital Stay (HOSPITAL_COMMUNITY): Payer: Medicaid Other

## 2014-07-23 MED ORDER — MORPHINE SULFATE ER 30 MG PO TBCR
30.0000 mg | EXTENDED_RELEASE_TABLET | Freq: Two times a day (BID) | ORAL | Status: DC
  Administered 2014-07-23 – 2014-07-25 (×5): 30 mg via ORAL
  Filled 2014-07-23 (×5): qty 1

## 2014-07-23 NOTE — Significant Event (Signed)
Rapid Response Event Note  Overview: Time Called: 1215 Arrival Time: 1220 Event Type: Respiratory  Initial Focused Assessment:  Called by RN to evaluate Chest tube, RN states may have been dislodged accidentally by patient when getting out of chair.  Dressing had been reinforced by RN, Broke down dressing, serosangious drainage noted, as per RN this is new.  Patient with expiratory wheezes, RR 26, c/o of trouble breathing, sats 92% on 5 lpm [place].  154/80, hr 87.  Skin is warm and dry.     Interventions:  MD had been paged prior to my arrival, RT at bedside to administer breathing treatment, PCXR at bedside   Event Summary: RN to call if assistance needed  at     at    Outcome: Stayed in room and stabalized     Rogers Blocker, Harlin Rain

## 2014-07-23 NOTE — Progress Notes (Signed)
PT Cancellation Note  Patient Details Name: [name] MRN: [id number] DOB: 12-02-1960   Cancelled Treatment:    Reason Eval/Treat Not Completed: Medical issues which prohibited therapy.  Spoke with RN about recent respiratory difficulties and chest tube placement concerns.  Will hold PT at this time and f/u another time.     Galaxy Borden, Thornton Papas 07/23/2014, 1:41 PM

## 2014-07-23 NOTE — Progress Notes (Signed)
Pt denies drinking alcohol in excess. No further intervention.

## 2014-07-23 NOTE — Clinical Social Work Note (Cosign Needed)
Clinical Social Work Assessment  Patient Details  Name: [name] MRN: [id number] Date of Birth: 1960/07/16  Date of referral:  07/23/14               Reason for consult:  Rule-out Psychosocial                Permission sought to share information with:    Permission granted to share information::     Name::        Agency::     Relationship::     Contact Information:     Housing/Transportation Living arrangements for the past 2 months:  Single Family Home Source of Information:  Patient Patient Interpreter Needed:  None Criminal Activity/Legal Involvement Pertinent to Current Situation/Hospitalization:  No - Comment as needed Significant Relationships:  Adult Children Lives with:  Self Do you feel safe going back to the place where you live?  Yes Need for family participation in patient care:  Yes (Comment) (Patient needs supervised care.)  Care giving concerns:  Patient not assessed by PT as of yet. Will await PT recommendations.   Social Worker assessment / plan: CSW Intern discussed possible plan for discharge with patient. Patient stated he lives alone but his children are willing to stay with him, if need for support. Will await PT recommendations. CSW will f/u.  Employment status:  Other Network engineer) Insurance information:  Medicaid [place] PT Recommendations:  Not assessed at this time Information / Referral to community resources:  SBIRT  Patient/Family's Response to care:  Patient alert and oriented x4 but appeared to be in pain. Patients brother, son, and nephew in the room during assessment. Patient engaged in [place] Intern assessment and agreeable with need for support upon discharge. Patient understanding of Social Work role and appreciative of support.   Patient/Family's Understanding of and Emotional Response to Diagnosis, Current Treatment, and Prognosis:  Patient understanding that he will need support upon discharge. Patient's family available for  support.   Emotional Assessment Appearance:  Appears stated age Attitude/Demeanor/Rapport:  Other (Cooperative) Affect (typically observed):  Appropriate Orientation:  Oriented to Self, Oriented to Place, Oriented to  Time, Oriented to Situation Alcohol / Substance use:  Other (Drinks alcohol on occassion) Psych involvement (Current and /or in the community):  No (Comment)  Discharge Needs  Concerns to be addressed:  Discharge Planning Concerns Readmission within the last 30 days:  No Current discharge risk:  None Barriers to Discharge:  No Barriers Identified   Truitt Merle, Student-SW 07/23/2014, 11:25 PM

## 2014-07-23 NOTE — Progress Notes (Signed)
Patient ID: [name], male   DOB: 1960-10-07, 54 y.o.   MRN: [id number]   LOS: 1 day   Subjective: Struggling with pain control, otherwise ok.   Objective: Vital signs in last 24 hours: Temp:  [98 F (36.7 C)-99.3 F (37.4 C)] 98 F (36.7 C) (04/29 0531) Pulse Rate:  [84-112] 90 (04/29 0531) Resp:  [18-32] 18 (04/29 0531) BP: (123-160)/(70-106) 143/97 mmHg (04/29 0531) SpO2:  [92 %-97 %] 92 % (04/29 0531)    CT No air leak Minimal OP   Radiology Results PORTABLE CHEST - 1 VIEW  COMPARISON: 07/22/2014. CT 07/21/2014.  FINDINGS: Right chest tube in unchanged position with side-hole along the chest wall as previously noted. No pneumothorax. Mediastinum and hilar structures are normal . Mediastinum and hilar structures are stable. Bilateral subsegmental atelectasis again noted and unchanged. No pleural effusion. Mild right chest wall subcutaneous emphysema. Right posterior lateral rib fractures again noted .  IMPRESSION: 1. Right chest tube in stable position. Side-hole again noted along the chest wall. No pneumothorax. Mild right chest wall subcutaneous emphysema again noted. 2. Stable bilateral subsegmental atelectasis. No interim change. 3. Right posterior lateral rib fractures again noted.   Electronically Signed  By: Marcello Moores Register  On: 07/23/2014 07:32   Physical Exam General appearance: alert and no distress Resp: wheezes bilaterally Cardio: regular rate and rhythm GI: normal findings: bowel sounds normal and soft, non-tender   Assessment/Plan: Fall Right rib fxs w/HPTX s/p CT -- CT to water seal FEN -- Add MS Contin, D/C tramadol to simplify regimen VTE -- SCD's, Lovenox  Dispo -- CT, PT eval    Lisette Abu, PA-C Pager: [phone] General Trauma PA Pager: [phone]  07/23/2014

## 2014-07-24 ENCOUNTER — Inpatient Hospital Stay (HOSPITAL_COMMUNITY): Payer: Medicaid Other

## 2014-07-24 MED ORDER — IPRATROPIUM-ALBUTEROL 0.5-2.5 (3) MG/3ML IN SOLN: 3.0000 mL | Freq: Four times a day (QID) | RESPIRATORY_TRACT | Status: DC

## 2014-07-24 MED ORDER — ALBUTEROL SULFATE (2.5 MG/3ML) 0.083% IN NEBU: 2.5000 mg | INHALATION_SOLUTION | RESPIRATORY_TRACT | Status: DC | PRN

## 2014-07-24 MED ORDER — ALUM & MAG HYDROXIDE-SIMETH 200-200-20 MG/5ML PO SUSP
30.0000 mL | Freq: Four times a day (QID) | ORAL | Status: DC | PRN
  Administered 2014-07-24 – 2014-07-25 (×2): 30 mL via ORAL
  Filled 2014-07-24 (×2): qty 30

## 2014-07-24 MED ORDER — IPRATROPIUM-ALBUTEROL 0.5-2.5 (3) MG/3ML IN SOLN
3.0000 mL | Freq: Four times a day (QID) | RESPIRATORY_TRACT | Status: DC
  Administered 2014-07-24 – 2014-07-25 (×3): 3 mL via RESPIRATORY_TRACT
  Filled 2014-07-24 (×3): qty 3

## 2014-07-24 NOTE — Progress Notes (Signed)
Patient walking in the hallways; heart monitoring went out of range; patient found walking not only off the unit but outside; witness reports he smoked with a visitor then returned to the unit.  Instructed patient upon his return that he doesn't have permission to leave the unit and close monitoring is important for his recovery and safety.

## 2014-07-24 NOTE — Evaluation (Signed)
Physical Therapy Evaluation Patient Details Name: [name] MRN: [id number] DOB: 08/26/1960 Today's Date: 07/24/2014   History of Present Illness  54 y.o. male admitted after fall resulting in Rt rib fractures x2 associated PTX.  Clinical Impression  Pt admitted with above complications. Pt currently with functional limitations due to the deficits listed below (see PT Problem List). Ambulates generally well, mildly guarded gait pattern. SpO2 91-95% while on 4L supplemental O2 during bout. Pt will benefit from skilled PT to increase their independence, endurance, and safety with mobility to allow discharge to the venue listed below.       Follow Up Recommendations No PT follow up    Equipment Recommendations  None recommended by PT    Recommendations for Other Services       Precautions / Restrictions Precautions Precautions: None Precaution Comments: monitor O2 Restrictions Weight Bearing Restrictions: No      Mobility  Bed Mobility Overal bed mobility: Modified Independent             General bed mobility comments: extra time  Transfers Overall transfer level: Needs assistance Equipment used: None Transfers: Sit to/from Stand Sit to Stand: Supervision         General transfer comment: supervision for safety. no assist needed  Ambulation/Gait Ambulation/Gait assistance: Supervision Ambulation Distance (Feet): 125 Feet Assistive device: None Gait Pattern/deviations: Step-through pattern;Wide base of support;Decreased stride length Gait velocity: decreased   General Gait Details: Mildly guarded gait pattern. Supervision provided for safety, following with O2 tank. VC for pursed lip breathing if feeling SOB. 2/4 dyspnea with some wheezing noted. SpO2 91-95% on 4L supplemental O2 during bout. No loss of balance or symptoms of lightheadedness/dizziness.  Stairs            Wheelchair Mobility    Modified Rankin (Stroke Patients Only)        Balance Overall balance assessment: Needs assistance Sitting-balance support: No upper extremity supported;Feet supported Sitting balance-Leahy Scale: Normal     Standing balance support: No upper extremity supported Standing balance-Leahy Scale: Good                               Pertinent Vitals/Pain Pain Assessment: 0-10 Pain Score: 6  Pain Location: Lt foot - states this has been present for years Pain Descriptors / Indicators: Constant;Burning ("arthritic" ) Pain Intervention(s): Monitored during session;Repositioned    Home Living Family/patient expects to be discharged to:: Private residence Living Arrangements: Spouse/significant other Available Help at Discharge: Available 24 hours/day;Other (Comment) (Girlfriend) Type of Home: House Home Access: Level entry     Home Layout: One level Home Equipment: None      Prior Function Level of Independence: Independent               Hand Dominance   Dominant Hand: Right    Extremity/Trunk Assessment   Upper Extremity Assessment: Defer to OT evaluation           Lower Extremity Assessment: Overall WFL for tasks assessed         Communication   Communication: No difficulties  Cognition Arousal/Alertness: Lethargic;Suspect due to medications Behavior During Therapy: [facility] for tasks assessed/performed Overall Cognitive Status: Within Functional Limits for tasks assessed                      General Comments      Exercises        Assessment/Plan  PT Assessment Patient needs continued PT services  PT Diagnosis Abnormality of gait;Acute pain   PT Problem List Decreased activity tolerance;Decreased mobility;Cardiopulmonary status limiting activity;Pain  PT Treatment Interventions DME instruction;Gait training;Functional mobility training;Therapeutic activities;Balance training;Therapeutic exercise;Neuromuscular re-education;Patient/family education;Modalities   PT Goals  (Current goals can be found in the Care Plan section) Acute Rehab PT Goals Patient Stated Goal: None stated PT Goal Formulation: With patient Time For Goal Achievement: 08/07/14 Potential to Achieve Goals: Good    Frequency Min 3X/week   Barriers to discharge        Co-evaluation               End of Session Equipment Utilized During Treatment: Oxygen Activity Tolerance: Patient tolerated treatment well Patient left: in chair;with call bell/phone within reach Nurse Communication: Mobility status (O2 left on 3L with SpO2 monitor on.)         Time: 4562-5638 PT Time Calculation (min) (ACUTE ONLY): 30 min   Charges:   PT Evaluation $Initial PT Evaluation Tier I: 1 Procedure PT Treatments $Gait Training: 8-22 mins   PT G CodesEllouise Newer 07/24/2014, 11:43 AM  Elayne Snare, [place]

## 2014-07-24 NOTE — Progress Notes (Signed)
Patient ID: [name], male   DOB: Aug 23, 1960, 54 y.o.   MRN: [id number] [facility] Surgery Progress Note:   * No surgery found *  Subjective: Mental status is clear.   Objective: Vital signs in last 24 hours: Temp:  [98 F (36.7 C)-98.9 F (37.2 C)] 98.5 F (36.9 C) (04/30 0529) Pulse Rate:  [87-100] 100 (04/30 0529) Resp:  [19-25] 19 (04/30 0529) BP: (131-168)/(80-148) 131/95 mmHg (04/30 0639) SpO2:  [91 %-96 %] 96 % (04/30 0757)  Intake/Output from previous day: 04/29 0701 - 04/30 0700 In: 840 [P.O.:840] Out: 40  Intake/Output this shift:    Physical Exam: Work of breathing is normal but some wheezing.  Xray reviewed-no pneumothorax.  Chest tube removed without difficulty  Lab Results:  No results found for this or any previous visit (from the past 48 hour(s)).  Radiology/Results: Dg Chest Port 1 View  07/24/2014   CLINICAL DATA:  Traumatic hemo pneumothorax  EXAM: PORTABLE CHEST - 1 VIEW  COMPARISON:  07/23/2014; 07/22/2014; 07/21/2014; chest CT - 07/21/2014  FINDINGS: Grossly unchanged cardiac silhouette and mediastinal contours given persistently reduced lung volume. Stable positioning of right lateral chest tube with side port projected external to the right hemi thorax. Minimally improved aeration of the lungs with residual linear perihilar heterogeneous opacities favored to represent subsegmental atelectasis. No new focal airspace opacities. No pleural effusion or pneumothorax. Unchanged bones.  IMPRESSION: 1.  Stable positioning of support apparatus.  No pneumothorax. 2. Improved aeration of the lungs with minimal residual perihilar atelectasis.   Electronically Signed   By: Sandi Mariscal M.D.   On: 07/24/2014 08:01   Dg Chest Port 1 View  07/23/2014   CLINICAL DATA:  Recent pneumothorax with right chest tube in place. Hypertension.  EXAM: PORTABLE CHEST - 1 VIEW  COMPARISON:  July 22, 2014 and July 23, 2014  FINDINGS: Chest tube on the right is again noted  with the side port near the lateral chest wall. There is currently no appreciable pneumothorax on the right. There has been significant resolution of right midlung atelectasis compared to earlier in the day. There is patchy atelectasis in the left base which is stable. No new opacity. Heart is upper normal in size with pulmonary vascularity within normal limits. No adenopathy. There is stable subcutaneous air on the right laterally.  IMPRESSION: No change in right chest tube placement. Note that the side port is near the lateral chest wall, stable. No pneumothorax apparent. There is, however, stable subcutaneous air on the right laterally. Significantly less right midlung atelectasis compared to earlier in the day. Mild atelectasis left base stable. No change in cardiac silhouette.   Electronically Signed   By: Lowella Grip III M.D.   On: 07/23/2014 12:51   Dg Chest Port 1 View  07/23/2014   CLINICAL DATA:  Hemo pneumothorax.  EXAM: PORTABLE CHEST - 1 VIEW  COMPARISON:  07/22/2014.  CT 07/21/2014.  FINDINGS: Right chest tube in unchanged position with side-hole along the chest wall as previously noted. No pneumothorax. Mediastinum and hilar structures are normal . Mediastinum and hilar structures are stable. Bilateral subsegmental atelectasis again noted and unchanged. No pleural effusion. Mild right chest wall subcutaneous emphysema. Right posterior lateral rib fractures again noted .  IMPRESSION: 1. Right chest tube in stable position. Side-hole again noted along the chest wall. No pneumothorax. Mild right chest wall subcutaneous emphysema again noted. 2. Stable bilateral subsegmental atelectasis.  No interim change. 3. Right posterior lateral rib fractures again  noted.   Electronically Signed   By: [place]   On: 07/23/2014 07:32    Anti-infectives: Anti-infectives    None      Assessment/Plan: Problem List: Patient Active Problem List   Diagnosis Date Noted  . Fall 07/22/2014  .  Multiple fractures of ribs of right side 07/22/2014  . Pneumothorax on right 07/22/2014  . Traumatic pneumohemothorax 07/21/2014  . Altered mental state 04/15/2013  . Acute encephalopathy 04/15/2013  . Difficulty speaking 04/15/2013  . Headache(784.0) 04/15/2013  . Tobacco abuse 04/15/2013  . HTN (hypertension) 04/15/2013    Chest tube out.  Repeat CXR in AM with possible discharge * No surgery found *    LOS: 2 days   Matt B. Hassell Done, MD, [facility] [phone] beeper [phone]  07/24/2014 9:53 AM

## 2014-07-25 ENCOUNTER — Inpatient Hospital Stay (HOSPITAL_COMMUNITY): Payer: Medicaid Other

## 2014-07-25 MED ORDER — IPRATROPIUM-ALBUTEROL 0.5-2.5 (3) MG/3ML IN SOLN: 3.0000 mL | RESPIRATORY_TRACT | Status: DC | PRN

## 2014-07-25 MED ORDER — HYDROMORPHONE HCL 8 MG PO TABS: 8.0000 mg | ORAL_TABLET | ORAL | Status: DC | PRN

## 2014-07-25 MED ORDER — OXYCODONE-ACETAMINOPHEN 10-325 MG PO TABS: 1.0000 | ORAL_TABLET | ORAL | Status: DC | PRN

## 2014-07-25 MED ORDER — IPRATROPIUM-ALBUTEROL 18-103 MCG/ACT IN AERO: 2.0000 | INHALATION_SPRAY | RESPIRATORY_TRACT | Status: DC | PRN

## 2014-07-25 NOTE — Progress Notes (Signed)
Patient ID: [name], male   DOB: [birth year], 54 y.o.   MRN: [id number]   LOS: 3 days   Subjective: Improved   Objective: Vital signs in last 24 hours: Temp:  [97.7 F (36.5 C)-98.1 F (36.7 C)] 97.8 F (36.6 C) (05/01 0514) Pulse Rate:  [84-91] 84 (05/01 0514) Resp:  [18-20] 19 (05/01 0514) BP: (138-146)/(72-95) 138/72 mmHg (05/01 0514) SpO2:  [91 %-97 %] 92 % (05/01 0514) Last BM Date: 07/24/14   Radiology Results PORTABLE CHEST - 1 VIEW  COMPARISON: 07/24/2014  FINDINGS: Heart is normal in size. There are streaky areas of atelectasis bilaterally. Right chest tube has been removed. No pneumothorax. There is no pulmonary edema.  IMPRESSION: 1. No pneumothorax following chest tube removal. 2. Streaky areas of atelectasis persist.   Electronically Signed  By: Nolon Nations M.D.  On: 07/25/2014 09:18   Physical Exam General appearance: alert and no distress Resp: clear to auscultation bilaterally Cardio: regular rate and rhythm GI: normal findings: bowel sounds normal and soft, non-tender   Assessment/Plan: Fall Right rib fxs w/HPTX s/p CT -- CXR ok Dispo -- Home today    Lisette Abu, PA-C Pager: [phone] General Trauma PA Pager: [phone]  07/25/2014

## 2014-07-25 NOTE — Progress Notes (Signed)
Pt removed telemetry box and refused to have it replaced.  Says he is tired of the wires and the"beeping"; he is unable to sleep. Pt has been in sinus rhythm, sinus tach for >48 hours.  Patient also removed the dressing from his chest tube site and that was replaced.

## 2014-07-25 NOTE — Discharge Summary (Signed)
Physician Discharge Summary  Patient ID: [name] MRN: [id number] DOB/AGE: May 03, 1960 54 y.o.  Admit date: 07/21/2014 Discharge date: 07/25/2014  Discharge Diagnoses Patient Active Problem List   Diagnosis Date Noted  . Fall 07/22/2014  . Multiple fractures of ribs of right side 07/22/2014  . Pneumothorax on right 07/22/2014  . Traumatic pneumohemothorax 07/21/2014  . Altered mental state 04/15/2013  . Acute encephalopathy 04/15/2013  . Difficulty speaking 04/15/2013  . Headache(784.0) 04/15/2013  . Tobacco abuse 04/15/2013  . 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 He complained of severe right-sided chest pain and shortness of breath. He was hypoxic prior to arrival. His workup included CT scans of the head, cervical spine, chest, abdomen, and pelvis and showed the rib fractures and a small pneumothorax. He was admitted to the trauma service for pain control and pulmonary toilet.   Hospital Course: The following morning the patient was in some respiratory distress and a chest x-ray confirmed that his pneumothorax had gotten much bigger. A chest tube was placed with good result. This was able to be weaned to water seal and removed without difficulty. His pain was controlled on oral medications though the patient required large doses to achieve marginal control. Although he was controlled on a long-acting narcotic this had to be changed at discharge as Medicaid will not approve long-acting narcotics for acute pain. He was discharged home in good condition.     Medication List    STOP taking these medications        oxyCODONE-acetaminophen 5-325 MG per tablet  Commonly known as:  PERCOCET/ROXICET  Replaced by:  oxyCODONE-acetaminophen 10-325 MG per tablet      TAKE these medications        albuterol-ipratropium 18-103 MCG/ACT inhaler  Commonly known as:  COMBIVENT  Inhale 2 puffs into the lungs every 4 (four) hours as needed for wheezing or shortness of breath.     cephALEXin 500 MG capsule  Commonly known as:  KEFLEX  Take 1 capsule (500 mg total) by mouth 4 (four) times daily.     hydrochlorothiazide 25 MG tablet  Commonly known as:  HYDRODIURIL  Take 25 mg by mouth daily.     HYDROmorphone 8 MG tablet  Commonly known as:  DILAUDID  Take 1 tablet (8 mg total) by mouth every 4 (four) hours as needed for severe pain.     ibuprofen 800 MG tablet  Commonly known as:  ADVIL,MOTRIN  Take 1 tablet (800 mg total) by mouth 3 (three) times daily.     oxyCODONE-acetaminophen 10-325 MG per tablet  Commonly known as:  PERCOCET  Take 1-2 tablets by mouth every 4 (four) hours as needed for pain.            Follow-up Information    Follow up with [place] On 08/04/2014.   Why:  2:00PM   Contact information:   [address] [place] [address] [phone]       Signed: Lisette Abu, PA-C Pager: [phone] General Trauma PA Pager: [phone] 07/25/2014, 10:03 AM

## 2015-04-25 ENCOUNTER — Encounter (HOSPITAL_COMMUNITY): Payer: Self-pay

## 2015-04-25 ENCOUNTER — Emergency Department (HOSPITAL_COMMUNITY): Payer: Medicaid Other

## 2015-04-25 ENCOUNTER — Observation Stay (HOSPITAL_COMMUNITY)
Admission: EM | Admit: 2015-04-25 | Discharge: 2015-04-27 | Disposition: A | Discharge status: Home / Self Care | Payer: Medicaid Other | Attending: Internal Medicine | Admitting: Internal Medicine

## 2015-04-25 DIAGNOSIS — E876 Hypokalemia: Secondary | ICD-10-CM | POA: Diagnosis present

## 2015-04-25 DIAGNOSIS — R079 Chest pain, unspecified: Secondary | ICD-10-CM | POA: Diagnosis present

## 2015-04-25 DIAGNOSIS — F17210 Nicotine dependence, cigarettes, uncomplicated: Secondary | ICD-10-CM | POA: Diagnosis present

## 2015-04-25 DIAGNOSIS — Z79899 Other long term (current) drug therapy: Secondary | ICD-10-CM | POA: Insufficient documentation

## 2015-04-25 DIAGNOSIS — R55 Syncope and collapse: Secondary | ICD-10-CM | POA: Diagnosis not present

## 2015-04-25 DIAGNOSIS — F17200 Nicotine dependence, unspecified, uncomplicated: Secondary | ICD-10-CM | POA: Diagnosis not present

## 2015-04-25 DIAGNOSIS — I10 Essential (primary) hypertension: Secondary | ICD-10-CM | POA: Diagnosis not present

## 2015-04-25 DIAGNOSIS — Z792 Long term (current) use of antibiotics: Secondary | ICD-10-CM | POA: Insufficient documentation

## 2015-04-25 DIAGNOSIS — J449 Chronic obstructive pulmonary disease, unspecified: Secondary | ICD-10-CM | POA: Diagnosis present

## 2015-04-25 DIAGNOSIS — Z23 Encounter for immunization: Secondary | ICD-10-CM | POA: Insufficient documentation

## 2015-04-25 LAB — BASIC METABOLIC PANEL
Anion gap: 12 (ref 5–15)
BUN: 6 mg/dL (ref 6–20)
CALCIUM: 9.5 mg/dL (ref 8.9–10.3)
CO2: 23 mmol/L (ref 22–32)
CREATININE: 0.97 mg/dL (ref 0.61–1.24)
Chloride: 107 mmol/L (ref 101–111)
GFR calc Af Amer: >60 mL/min (ref >60)
GFR calc non Af Amer: >60 mL/min (ref >60)
GLUCOSE: 123 mg/dL — AB (ref 65–99)
Potassium: 3.4 mmol/L — ABNORMAL LOW (ref 3.5–5.1)
Sodium: 142 mmol/L (ref 135–145)

## 2015-04-25 LAB — CBC
HCT: 44.1 % (ref 39.0–52.0)
HEMOGLOBIN: 14.7 g/dL (ref 13.0–17.0)
MCH: 29.1 pg (ref 26.0–34.0)
MCHC: 33.3 g/dL (ref 30.0–36.0)
MCV: 87.3 fL (ref 78.0–100.0)
PLATELETS: 209 10*3/uL (ref 150–400)
RBC: 5.05 MIL/uL (ref 4.22–5.81)
RDW: 14.1 % (ref 11.5–15.5)
WBC: 6.9 10*3/uL (ref 4.0–10.5)

## 2015-04-25 LAB — I-STAT TROPONIN, ED: Troponin i, poc: 0 ng/mL (ref 0.00–0.08)

## 2015-04-25 MED ORDER — ASPIRIN 81 MG PO CHEW
324.0000 mg | CHEWABLE_TABLET | Freq: Once | ORAL | Status: AC
  Administered 2015-04-25: 324 mg via ORAL
  Filled 2015-04-25: qty 4

## 2015-04-25 MED ORDER — METHYLPREDNISOLONE SODIUM SUCC 125 MG IJ SOLR
125.0000 mg | Freq: Once | INTRAMUSCULAR | Status: AC
  Administered 2015-04-25: 125 mg via INTRAVENOUS
  Filled 2015-04-25: qty 2

## 2015-04-25 MED ORDER — SODIUM CHLORIDE 0.9 % IV BOLUS (SEPSIS)
1000.0000 mL | Freq: Once | INTRAVENOUS | Status: AC
  Administered 2015-04-25: 1000 mL via INTRAVENOUS

## 2015-04-25 MED ORDER — IPRATROPIUM-ALBUTEROL 0.5-2.5 (3) MG/3ML IN SOLN
3.0000 mL | RESPIRATORY_TRACT | Status: AC
  Administered 2015-04-25 – 2015-04-26 (×3): 3 mL via RESPIRATORY_TRACT
  Filled 2015-04-25 (×2): qty 3

## 2015-04-25 MED ORDER — ONDANSETRON HCL 4 MG/2ML IJ SOLN
4.0000 mg | Freq: Once | INTRAMUSCULAR | Status: AC
  Administered 2015-04-25: 4 mg via INTRAVENOUS
  Filled 2015-04-25: qty 2

## 2015-04-25 MED ORDER — NITROGLYCERIN 0.4 MG SL SUBL
0.4000 mg | SUBLINGUAL_TABLET | SUBLINGUAL | Status: DC | PRN
  Administered 2015-04-25 – 2015-04-26 (×2): 0.4 mg via SUBLINGUAL
  Filled 2015-04-25: qty 1

## 2015-04-25 NOTE — ED Provider Notes (Signed)
By signing my name below, I, Altamease Oiler, attest that this documentation has been prepared under the direction and in the presence of [place], DO. Electronically Signed: Altamease Oiler, ED Scribe. 04/26/2015. 2:39 AM.  TIME SEEN: 11:40 PM  CHIEF COMPLAINT: Chest pain  HPI: [name] is a 55 y.o. male with history of HTN who presents to the Emergency Department complaining of constant, dull/pressure-like, 9/10 in severity, non-radiating, left-sided chest pain with onset this morning near 6:30 AM. The pain has no identifiable modifying factors. Associated symptoms include SOB, nausea, light-headedness, and blurred vision.  Pt denies sweating.  Has a syncopal event getting out of his car into the ED WR.  Pt states he doesn't remember what happened. He states that he had a cardiac catheterization 20 years ago in [place] and was told that he needed stents. By record, pt had a cardiac catheterization in 2009 with no evidence of CAD with an EF of 35-40% and an echocardiogram in January 2015 that showed an EF at that time of 55-60%. No known history of heart failure, asthma, or COPD. No history of DVT/PE. No recent long travel by car or plane, surgery or hospitalization, or bone fracture. He did not take aspirin today. Pt is a smoker. He has no PCP.   ROS: See HPI Constitutional: no fever  Eyes: no drainage  ENT: no runny nose   Cardiovascular: chest pain  Resp: SOB  GI: vomiting GU: no dysuria Integumentary: no rash  Allergy: no hives  Musculoskeletal: no leg swelling  Neurological: no slurred speech ROS otherwise negative  PAST MEDICAL HISTORY/PAST SURGICAL HISTORY:  Past Medical History  Diagnosis Date  . Hypertension     MEDICATIONS:  Prior to Admission medications   Medication Sig Start Date End Date Taking? Authorizing Provider  albuterol-ipratropium (COMBIVENT) 18-103 MCG/ACT inhaler Inhale 2 puffs into the lungs every 4 (four) hours as needed for wheezing or  shortness of breath. 07/25/14   Lisette Abu, PA-C  cephALEXin (KEFLEX) 500 MG capsule Take 1 capsule (500 mg total) by mouth 4 (four) times daily. 08/18/13   Tatyana Kirichenko, PA-C  hydrochlorothiazide (HYDRODIURIL) 25 MG tablet Take 25 mg by mouth daily.    Historical Provider, MD  HYDROmorphone (DILAUDID) 8 MG tablet Take 1 tablet (8 mg total) by mouth every 4 (four) hours as needed for severe pain. 07/25/14   Lisette Abu, PA-C  ibuprofen (ADVIL,MOTRIN) 800 MG tablet Take 1 tablet (800 mg total) by mouth 3 (three) times daily. 08/18/13   Tatyana Kirichenko, PA-C  oxyCODONE-acetaminophen (PERCOCET) 10-325 MG per tablet Take 1-2 tablets by mouth every 4 (four) hours as needed for pain. 07/25/14   Lisette Abu, PA-C    ALLERGIES:  No Known Allergies  SOCIAL HISTORY:  Social History  Substance Use Topics  . Smoking status: Current Every Day Smoker -- 0.50 packs/day  . Smokeless tobacco: Never Used  . Alcohol Use: Yes     Comment: on occasion    FAMILY HISTORY: Family History  Problem Relation Age of Onset  . CAD Father   . Bone cancer Mother     EXAM: BP 120/85 mmHg  Pulse 64  Temp(Src) 97.7 F (36.5 C) (Oral)  Resp 13  Ht 6\' 1"  (1.854 m)  Wt 245 lb (111.131 kg)  BMI 32.33 kg/m2  SpO2 96% CONSTITUTIONAL: Alert and oriented and responds appropriately to questions. Appears uncomfortable; well-nourished HEAD: Normocephalic EYES: Conjunctivae clear, PERRL ENT: normal nose; no rhinorrhea; moist mucous membranes;  pharynx without lesions noted NECK: Supple, no meningismus, no LAD  CARD: RRR; S1 and S2 appreciated; no murmurs, no clicks, no rubs, no gallops RESP: Normal chest excursion without splinting or tachypnea; breath sounds equal bilaterally; diffuse expiratory wheezing, no rhonchi, no rales, no hypoxia or respiratory distress, speaking full sentences ABD/GI: Normal bowel sounds; non-distended; soft, non-tender, no rebound, no guarding, no peritoneal signs BACK:   The back appears normal and is non-tender to palpation, there is no CVA tenderness EXT: Normal ROM in all joints; non-tender to palpation; no edema; normal capillary refill; no cyanosis, no calf tenderness or swelling    SKIN: Normal color for age and race; warm NEURO: Moves all extremities equally, sensation to light touch intact diffusely, cranial nerves II through XII intact PSYCH: The patient's mood and manner are appropriate. Grooming and personal hygiene are appropriate.  MEDICAL DECISION MAKING: Patient here with chest pain. Had a syncopal event outside. Has history of hypertension, tobacco use. Last crit catheterization was in 2009. Last echocardiogram in 2015. Differential diagnosis includes ACS, PE, dissection. He does have some wheezing diffusely. We'll give breathing treatments, steroids.    ED PROGRESS: Patient's labs unremarkable. Troponin negative. D-dimer negative. No improvement in pain with nitroglycerin. He has received aspirin. Pain is improved with IV morphine. His lungs are much better after 2 duo neb treatments.  1:44 AM I re-evaluated the patient and provided an update on the results of his lab work and imaging. He rates his chest pain 5/10 in severity at present and notes that morphine seemed to help more than NTG and the 2 breathing treatments that he has received so far. Will give dose of IV Dilaudid to get him chest pain-free. Will admit for chest pain rule out.  2:27 AM-Consult complete with Dr. Tamala Julian [facility]). Patient case explained and discussed. Agrees to admit patient for further evaluation and treatment, telemetry observation. Care transferred to hospitalist service.    EKG Interpretation  Date/Time:  Monday April 25 2015 22:39:45 EST Ventricular Rate:  72 PR Interval:  169 QRS Duration: 88 QT Interval:  408 QTC Calculation: 446 R Axis:   20 Text Interpretation:  Sinus rhythm Baseline wander in lead(s) V1 V2 No significant change since last tracing  Confirmed by Beverley Allender,  DO, Vincy Feliz [id number]) on 04/25/2015 11:08:30 PM      I personally performed the services described in this documentation, which was scribed in my presence. The recorded information has been reviewed and is accurate.      [place], DO 04/26/15 [phone]

## 2015-04-25 NOTE — ED Notes (Signed)
Dr. Ward at the bedside.  

## 2015-04-25 NOTE — ED Notes (Signed)
Pt was helped out of car by staff and had a syncopal episode out front. Pt brought to room 15 for cp that has been going on all day to the left chest. Tightness in nature. States the pain has been constant all day. States he vomited this morning when the pain started.

## 2015-04-25 NOTE — ED Notes (Signed)
Patient transported to X-ray 

## 2015-04-26 ENCOUNTER — Observation Stay (HOSPITAL_COMMUNITY): Payer: Medicaid Other

## 2015-04-26 DIAGNOSIS — Z720 Tobacco use: Secondary | ICD-10-CM | POA: Diagnosis not present

## 2015-04-26 DIAGNOSIS — J441 Chronic obstructive pulmonary disease with (acute) exacerbation: Secondary | ICD-10-CM | POA: Diagnosis not present

## 2015-04-26 DIAGNOSIS — Z8249 Family history of ischemic heart disease and other diseases of the circulatory system: Secondary | ICD-10-CM

## 2015-04-26 DIAGNOSIS — R079 Chest pain, unspecified: Secondary | ICD-10-CM | POA: Diagnosis not present

## 2015-04-26 DIAGNOSIS — E876 Hypokalemia: Secondary | ICD-10-CM | POA: Diagnosis not present

## 2015-04-26 DIAGNOSIS — J449 Chronic obstructive pulmonary disease, unspecified: Secondary | ICD-10-CM | POA: Diagnosis present

## 2015-04-26 DIAGNOSIS — I10 Essential (primary) hypertension: Secondary | ICD-10-CM

## 2015-04-26 LAB — NM MYOCAR MULTI W/SPECT W/WALL MOTION / EF
CHL CUP RESTING HR STRESS: 65 {beats}/min
CSEPEDS: 15 s
Exercise duration (min): 5 min
MPHR: 165 {beats}/min
Peak HR: 106 {beats}/min
Percent HR: 64 %

## 2015-04-26 LAB — BASIC METABOLIC PANEL
Anion gap: 8 (ref 5–15)
BUN: 7 mg/dL (ref 6–20)
CALCIUM: 9.2 mg/dL (ref 8.9–10.3)
CO2: 24 mmol/L (ref 22–32)
CREATININE: 0.91 mg/dL (ref 0.61–1.24)
Chloride: 109 mmol/L (ref 101–111)
Glucose, Bld: 157 mg/dL — ABNORMAL HIGH (ref 65–99)
Potassium: 4.7 mmol/L (ref 3.5–5.1)
SODIUM: 141 mmol/L (ref 135–145)

## 2015-04-26 LAB — RAPID URINE DRUG SCREEN, HOSP PERFORMED
Amphetamines: NOT DETECTED
Barbiturates: NOT DETECTED
Benzodiazepines: NOT DETECTED
Cocaine: NOT DETECTED
Opiates: POSITIVE — AB
TETRAHYDROCANNABINOL: NOT DETECTED

## 2015-04-26 LAB — D-DIMER, QUANTITATIVE: D-Dimer, Quant: <0.27 ug/mL-FEU (ref 0.00–0.50)

## 2015-04-26 LAB — TROPONIN I
Troponin I: <0.03 ng/mL (ref <0.031)
Troponin I: <0.03 ng/mL (ref <0.031)

## 2015-04-26 MED ORDER — HYDROMORPHONE HCL 1 MG/ML IJ SOLN
1.0000 mg | Freq: Once | INTRAMUSCULAR | Status: AC
  Administered 2015-04-26: 1 mg via INTRAVENOUS
  Filled 2015-04-26: qty 1

## 2015-04-26 MED ORDER — ENOXAPARIN SODIUM 40 MG/0.4ML ~~LOC~~ SOLN
40.0000 mg | SUBCUTANEOUS | Status: DC
  Administered 2015-04-26 – 2015-04-27 (×2): 40 mg via SUBCUTANEOUS
  Filled 2015-04-26 (×2): qty 0.4

## 2015-04-26 MED ORDER — REGADENOSON 0.4 MG/5ML IV SOLN
INTRAVENOUS | Status: AC
  Administered 2015-04-26: 0.4 mg via INTRAVENOUS
  Filled 2015-04-26: qty 5

## 2015-04-26 MED ORDER — MORPHINE SULFATE (PF) 2 MG/ML IV SOLN
2.0000 mg | INTRAVENOUS | Status: DC | PRN
  Administered 2015-04-26 – 2015-04-27 (×6): 2 mg via INTRAVENOUS
  Filled 2015-04-26 (×6): qty 1

## 2015-04-26 MED ORDER — ACETAMINOPHEN 500 MG PO TABS
1000.0000 mg | ORAL_TABLET | Freq: Once | ORAL | Status: AC
  Administered 2015-04-26: 1000 mg via ORAL
  Filled 2015-04-26: qty 2

## 2015-04-26 MED ORDER — IPRATROPIUM-ALBUTEROL 0.5-2.5 (3) MG/3ML IN SOLN: 3.0000 mL | RESPIRATORY_TRACT | Status: DC | PRN

## 2015-04-26 MED ORDER — POTASSIUM CHLORIDE CRYS ER 20 MEQ PO TBCR
20.0000 meq | EXTENDED_RELEASE_TABLET | Freq: Once | ORAL | Status: AC
  Administered 2015-04-26: 20 meq via ORAL
  Filled 2015-04-26: qty 1

## 2015-04-26 MED ORDER — IPRATROPIUM-ALBUTEROL 0.5-2.5 (3) MG/3ML IN SOLN
3.0000 mL | RESPIRATORY_TRACT | Status: AC
  Administered 2015-04-26 (×2): 3 mL via RESPIRATORY_TRACT
  Filled 2015-04-26 (×2): qty 3

## 2015-04-26 MED ORDER — GI COCKTAIL ~~LOC~~: 30.0000 mL | Freq: Four times a day (QID) | ORAL | Status: DC | PRN

## 2015-04-26 MED ORDER — REGADENOSON 0.4 MG/5ML IV SOLN
0.4000 mg | Freq: Once | INTRAVENOUS | Status: AC
  Administered 2015-04-26: 0.4 mg via INTRAVENOUS
  Filled 2015-04-26: qty 5

## 2015-04-26 MED ORDER — TECHNETIUM TC 99M SESTAMIBI GENERIC - CARDIOLITE
10.0000 | Freq: Once | INTRAVENOUS | Status: AC | PRN
  Administered 2015-04-26: 10 via INTRAVENOUS

## 2015-04-26 MED ORDER — POTASSIUM CHLORIDE 20 MEQ PO PACK: 20.0000 meq | PACK | Freq: Once | ORAL | Status: DC

## 2015-04-26 MED ORDER — NICOTINE 21 MG/24HR TD PT24
21.0000 mg | MEDICATED_PATCH | Freq: Every day | TRANSDERMAL | Status: DC
  Administered 2015-04-26 – 2015-04-27 (×2): 21 mg via TRANSDERMAL
  Filled 2015-04-26 (×3): qty 1

## 2015-04-26 MED ORDER — ACETAMINOPHEN 325 MG PO TABS
650.0000 mg | ORAL_TABLET | ORAL | Status: DC | PRN
  Administered 2015-04-26: 650 mg via ORAL
  Filled 2015-04-26: qty 2

## 2015-04-26 MED ORDER — ASPIRIN EC 81 MG PO TBEC
81.0000 mg | DELAYED_RELEASE_TABLET | Freq: Every day | ORAL | Status: DC
  Administered 2015-04-26 – 2015-04-27 (×2): 81 mg via ORAL
  Filled 2015-04-26 (×2): qty 1

## 2015-04-26 MED ORDER — PANTOPRAZOLE SODIUM 40 MG PO TBEC
40.0000 mg | DELAYED_RELEASE_TABLET | Freq: Every day | ORAL | Status: DC
  Administered 2015-04-26: 40 mg via ORAL
  Filled 2015-04-26: qty 1

## 2015-04-26 MED ORDER — ONDANSETRON HCL 4 MG/2ML IJ SOLN: 4.0000 mg | Freq: Four times a day (QID) | INTRAMUSCULAR | Status: DC | PRN

## 2015-04-26 MED ORDER — TECHNETIUM TC 99M SESTAMIBI - CARDIOLITE
30.0000 | Freq: Once | INTRAVENOUS | Status: AC | PRN
  Administered 2015-04-26: 30 via INTRAVENOUS

## 2015-04-26 MED ORDER — MORPHINE SULFATE (PF) 4 MG/ML IV SOLN
4.0000 mg | Freq: Once | INTRAVENOUS | Status: AC
  Administered 2015-04-26: 4 mg via INTRAVENOUS
  Filled 2015-04-26: qty 1

## 2015-04-26 MED ORDER — PNEUMOCOCCAL VAC POLYVALENT 25 MCG/0.5ML IJ INJ
0.5000 mL | INJECTION | INTRAMUSCULAR | Status: AC
  Administered 2015-04-27: 0.5 mL via INTRAMUSCULAR
  Filled 2015-04-26: qty 0.5

## 2015-04-26 NOTE — Progress Notes (Signed)
     The patient was seen in nuclear medicine for a lexiscan myoview. He tolerated the procedure well. No acute ST or TW changes on ECG. Await nuclear images.    Ikeem Cleckler Stern PA-C  [facility]    

## 2015-04-26 NOTE — Clinical Social Work Note (Signed)
Clinical Social Work Assessment  Patient Details  Name: [name] MRN: [id number] Date of Birth: [birth year]  Date of referral:  04/26/15               Reason for consult:  Emotional/Coping/Adjustment to Illness, Family Concerns, Transportation, Housing Concerns/Homelessness                Permission sought to share information with:  Family Supports Permission granted to share information::  Yes, Verbal Permission Granted  Name::      (daughterJarrett Soho)  Agency::     Relationship::     Contact Information:     Housing/Transportation Living arrangements for the past 2 months:  Hotel/Motel, Homeless Source of Information:  Patient Patient Interpreter Needed:  None Criminal Activity/Legal Involvement Pertinent to Current Situation/Hospitalization:  No - Comment as needed Significant Relationships:  Adult Children, Friend, [employer] Lives with:  Minor Children Do you feel safe going back to the place where you live?  Yes Need for family participation in patient care:  No (Coment)  Care giving concerns:  CSW made aware of patient and his 2 minor children in need of support/services.   Social Worker assessment / plan:  CSW met with patient who admits to "being private and too proud to ask for help". He and his teenage children (15 and 56yo) have been living in a motel and [address]ne Lei off and on for the last 1.5 years. Patient reports he had a good job but they went under and he was out of work. 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 The money he makes is used to pay for food and motel rooms when they can afford it- "I've got this week paid for". He is committed to providing for his kids- they are both at the [employer]- He admits to feeling inadequate; stating, "I need to provide better for my kids". He admits to feelings of hopelessness earlier this month and states, "I was trying to figure out how to do it without  my kids finding me". He denies SI at this time- he is feeling more hopeful and is pleased to hear from me about some community resources that will assist him and his family- His [employer]ne Lei broke down with all their belongings in it and was towed to [employer]	[place]- I have spoken with them and am working to coordinate payment to get it out along with their belongings-  Also, working on some community based housing/support for them.    Employment status:  Disabled (Comment on whether or not currently receiving Disability) Insurance information:  Medicaid [place] PT Recommendations:  Not assessed at this time Information / Referral to community resources:  Other (Comment Required)  Patient/Family's Response to care:  Patient appreciative of everyone's care and support. Patient/Family's Understanding of and Emotional Response to Diagnosis, Current Treatment, and Prognosis:  Patient admits to not taking his BP meds- he has been providing for his kids and not utilizing money to fill his meds- they now have Medicaid which will help with RX cost.   Emotional Assessment Appearance:  Developmentally appropriate Attitude/Demeanor/Rapport:    Affect (typically observed):  Accepting, Hopeful, Appropriate, Pleasant Orientation:  Oriented to Self, Oriented to Place, Oriented to  Time, Oriented to Situation Alcohol / Substance use:  Tobacco Use Psych involvement (Current and /or in the community):  No (Comment)  Discharge Needs  Concerns to be addressed:  Discharge  Planning Concerns Readmission within the last 30 days:  No Current discharge risk:  Homeless, Substance Abuse Barriers to Discharge:  Other   Ludwig Clarks, LCSW 04/26/2015, 3:52 PM

## 2015-04-26 NOTE — H&P (Signed)
[facility] History and Physical  BRENAN LAVENDER [id number] DOB: December 17, 1960 DOA: 04/25/2015  Referring physician:ED PCP: No PCP Per Patient   Chief Complaint:  Chest pain  HPI:  Mr. [name] is a 55 year old male with past medical history significant for HTN, who presented with complaints of left-sided that started at approximately 6:30 AM. Describes pain as a dull pressure that was initially a 9 out of 10 on the pain scale. Noted associated symptoms of nausea, shortness of breath, dizziness/lightheadedness, and near syncope. Denies diaphoresis, fever, chills, or vomiting. Patient was noted to have have a witnessed syncopal event while trying to get out of the car to come into the ED. He had a previous cardiac catheterization in 2009 but there was no evidence of coronary artery disease and his last echocardiogram was in January 2015 oriented EF of 55-60%. Patient denies any recent travel or  sick contacts. Patient reports smoking less than a pack of cigarettes per day. He does note that he's had significant weight loss of approximately 42 pounds in the last month. States the weight loss was not warranted and feels that something is wrong. He reports associated enlarged lymph nodes around his throat that are tender to the touch.  Upon admission into the  emergency department patient is evaluated and initial EKG showed sinus rhythm similar to previous EKG and troponin was negative. A d-dimer was obtained and seen to be within normal limits. Patient was given morphine, nitroglycerin, and 2 breathing treatments with minimal change in chest pain. Following dose of IV Dilaudid patient reported being chest pain-free.      Past Medical History  Diagnosis Date  . Hypertension      Past Surgical History  Procedure Laterality Date  . Hip surgery    . Knee surgery    . Cardiac catheterization        Social History:  reports that he has been smoking.  He has never used  smokeless tobacco. He reports that he drinks alcohol. He reports that he does not use illicit drugs.  No Known Allergies  Family History  Problem Relation Age of Onset  . CAD Father   . Bone cancer Mother         Prior to Admission medications   Medication Sig Start Date End Date Taking? Authorizing Provider  albuterol-ipratropium (COMBIVENT) 18-103 MCG/ACT inhaler Inhale 2 puffs into the lungs every 4 (four) hours as needed for wheezing or shortness of breath. Patient not taking: Reported on 04/25/2015 07/25/14   Lisette Abu, PA-C  cephALEXin (KEFLEX) 500 MG capsule Take 1 capsule (500 mg total) by mouth 4 (four) times daily. Patient not taking: Reported on 04/25/2015 08/18/13   Tatyana Kirichenko, PA-C  HYDROmorphone (DILAUDID) 8 MG tablet Take 1 tablet (8 mg total) by mouth every 4 (four) hours as needed for severe pain. Patient not taking: Reported on 04/25/2015 07/25/14   Lisette Abu, PA-C  ibuprofen (ADVIL,MOTRIN) 800 MG tablet Take 1 tablet (800 mg total) by mouth 3 (three) times daily. Patient not taking: Reported on 04/25/2015 08/18/13   Jeannett Senior, PA-C  oxyCODONE-acetaminophen (PERCOCET) 10-325 MG per tablet Take 1-2 tablets by mouth every 4 (four) hours as needed for pain. Patient not taking: Reported on 04/25/2015 07/25/14   Lisette Abu, PA-C     Physical Exam: Filed Vitals:   04/26/15 0300 04/26/15 0323 04/26/15 0327 04/26/15 0807  BP: 131/115  111/62 118/69  Pulse: 69  65 64  Temp:   98.1 F (  36.7 C) 98.4 F (36.9 C)  TempSrc:   Oral Oral  Resp: 16  19 16   Height:  6\' 2"  (1.88 m)    Weight:  106.9 kg (235 lb 10.8 oz)    SpO2: 93%  96% 96%     Constitutional: Vital signs reviewed. Patient is a well-developed and well-nourished in moderate  distress and cooperative with exam. Alert and oriented x3.  Head: Normocephalic and atraumatic  Ear: TM normal bilaterally  Mouth: no erythema or exudates, MMM  Eyes: PERRL, EOMI, conjunctivae normal, No  scleral icterus.  Neck: Supple, Trachea midline normal ROM, No JVD, mass, thyromegaly, or carotid bruit present.  Cardiovascular: RRR, S1 normal, S2 normal, no MRG, pulses symmetric and intact bilaterally  Pulmonary/Chest: Bilateral wheezes Abdominal: Soft. Non-tender, non-distended, bowel sounds are normal, no masses, organomegaly, or guarding present.  GU: no CVA tenderness Musculoskeletal: No joint deformities, erythema, or stiffness, ROM full and no nontender Ext: no edema and no cyanosis, pulses palpable bilaterally (DP and PT)  Hematology: no cervical, inginal, or axillary adenopathy.  Neurological: A&O x3, Strenght is normal and symmetric bilaterally, cranial nerve II-XII are grossly intact, no focal motor deficit, sensory intact to light touch bilaterally.  Skin: Warm, dry and intact. No rash, cyanosis, or clubbing.  Psychiatric: Normal mood and affect. speech and behavior is normal. Judgment and thought content normal. Cognition and memory are normal.      Data Review   Micro Results No results found for this or any previous visit (from the past 240 hour(s)).  Radiology Reports Dg Chest 2 View  04/25/2015  CLINICAL DATA:  Left-sided chest pain since this morning EXAM: CHEST  2 VIEW COMPARISON:  07/25/2014 FINDINGS: Mild scarring in the right lung. Atelectasis seen previously has diminished. There is no edema, consolidation, effusion, or pneumothorax. Normal heart size and aortic contours. IMPRESSION: No active cardiopulmonary disease. Electronically Signed   By: Monte Fantasia M.D.   On: 04/25/2015 23:57     CBC  Recent Labs Lab 04/25/15 2253  WBC 6.9  HGB 14.7  HCT 44.1  PLT 209  MCV 87.3  MCH 29.1  MCHC 33.3  RDW 14.1    Chemistries   Recent Labs Lab 04/25/15 2253  NA 142  K 3.4*  CL 107  CO2 23  GLUCOSE 123*  BUN 6  CREATININE 0.97  CALCIUM 9.5    ------------------------------------------------------------------------------------------------------------------ estimated creatinine clearance is 112.1 mL/min (by C-G formula based on Cr of 0.97). ------------------------------------------------------------------------------------------------------------------ No results for input(s): HGBA1C in the last 72 hours. ------------------------------------------------------------------------------------------------------------------ No results for input(s): CHOL, HDL, LDLCALC, TRIG, CHOLHDL, LDLDIRECT in the last 72 hours. ------------------------------------------------------------------------------------------------------------------ No results for input(s): TSH, T4TOTAL, T3FREE, THYROIDAB in the last 72 hours.  Invalid input(s): FREET3 ------------------------------------------------------------------------------------------------------------------ No results for input(s): VITAMINB12, FOLATE, FERRITIN, TIBC, IRON, RETICCTPCT in the last 72 hours.  Coagulation profile No results for input(s): INR, PROTIME in the last 168 hours.   Recent Labs  04/25/15 2253  DDIMER <0.27    Cardiac Enzymes  Recent Labs Lab 04/26/15 0411  TROPONINI <0.03   ------------------------------------------------------------------------------------------------------------------ Invalid input(s): POCBNP   CBG: No results for input(s): GLUCAP in the last 168 hours.     EKG: Independently reviewed. Sinus rhythm with a baseline wander in V1 and V2   Assessment/Plan Active Problems:    Chest pain: Acute. Heart score of 4 - Admit to telemetry bed - Transient cardiac troponins - Nothing by mouth for possible need of procedure in a.m. - Echocardiogram in a.m. - Consult  cardiology in a.m. for possible need of stress test - Check UDS  Question COPD exacerbation: Patient given 125 mg of Solu-Medrol in the ED along with 2 breathing treatments.  Patient with a significant history of tobacco abuse. Chest x-ray appears to be within normal limits. - DuoNeb every 4 hours for now with as needed DuoNeb every 2 hours  Hypertension: Patient with no primary care provider and currently not taking any meds for blood pressure. - Continue to monitor  Tobacco abuse: Patient reports smoking less than 1 pack of cigarettes per day - Counseled on the need of smoking cessation - Offered patient a nicotine patch  Hypokalemia: Potassium 3.4 on admission - Potassium chloride 20 mEq x 1 dose now -Continue to monitor and replace as needed   Code Status:   full Family Communication: bedside Disposition Plan: admit   Total time spent 55 minutes.Greater than 50% of this time was spent in counseling, explanation of diagnosis, planning of further management, and coordination of care  [facility] Pager [phone]  If 7PM-7AM, please contact night-coverage [email or web address] Password [email or web address] 04/26/2015, 9:11 AM

## 2015-04-26 NOTE — Consult Note (Signed)
CARDIOLOGY CONSULT NOTE   Patient ID: [name] MRN: [id number] DOB/AGE: 09/22/1960 55 y.o.  Admit date: 04/25/2015  Primary Physician   No PCP Per Patient Primary Cardiologist   New Reason for Consultation   Chest pain Requesting Physician Dr. Dyann Kief  HPI: [name] is a 55 y.o. male with a history of HTN (not on any medications) and tobacco abuse for the past 25 years who came to ED for evaluation of chest pain.    The patient had cath 12/10/2007  for chest pain and exertional SOB which showed normal coronaries with mild to moderate Lv dysfunction with EF of 35-40% --> placed on BB and ACE. Last echocardiogram 04/15/2013 shows left ventricular function of 55-60% with mild LVH.   For the past week or so he has being having worsening shortness of breath, initially it was exertional and now occurs at rest as well. Yesterday around 7 AM he woke up from sleep with left-sided chest pain. He describes the pain as a sharp. The pain was associated with shortness of breath, nausea and lightheadedness. Later in a day patient developed a dull constant chest pressure at midsternal area. Intermittently his pain get worse with exertion and decrease his rest. Intermittently radiates to his left shoulder as well. Patient was noted to have have a witnessed syncopal event while trying to get out of the car to come into the ED. He felt dizziness prior to syncopal episode.  His pain did not improved on nitroglycerin in ED. However, somewhat improved with fentanyl. Trop x 2 negative. D-dimer negative. CXR clear. Breathing improved on nebulizer treatment. EKG shows normal sinus rhythm without acute ST or T wave abnormality. Currently staying having 2 out of 10 mild substernal chest "pressure".  Father had a heart attack and stroke in his 82s, died of MI at age 28    Past Medical History  Diagnosis Date  . Hypertension      Past Surgical History  Procedure Laterality Date  . Hip  surgery    . Knee surgery    . Cardiac catheterization      No Known Allergies  I have reviewed the patient's current medications . enoxaparin (LOVENOX) injection  40 mg Subcutaneous Q24H  . ipratropium-albuterol  3 mL Nebulization Q4H  . nicotine  21 mg Transdermal Daily  . [START ON 04/27/2015] pneumococcal 23 valent vaccine  0.5 mL Intramuscular Tomorrow-1000     acetaminophen, gi cocktail, ipratropium-albuterol, morphine injection, nitroGLYCERIN, ondansetron (ZOFRAN) IV  Prior to Admission medications   Medication Sig Start Date End Date Taking? Authorizing Provider  albuterol-ipratropium (COMBIVENT) 18-103 MCG/ACT inhaler Inhale 2 puffs into the lungs every 4 (four) hours as needed for wheezing or shortness of breath. Patient not taking: Reported on 04/25/2015 07/25/14   Lisette Abu, PA-C  cephALEXin (KEFLEX) 500 MG capsule Take 1 capsule (500 mg total) by mouth 4 (four) times daily. Patient not taking: Reported on 04/25/2015 08/18/13   Tatyana Kirichenko, PA-C  HYDROmorphone (DILAUDID) 8 MG tablet Take 1 tablet (8 mg total) by mouth every 4 (four) hours as needed for severe pain. Patient not taking: Reported on 04/25/2015 07/25/14   Lisette Abu, PA-C  ibuprofen (ADVIL,MOTRIN) 800 MG tablet Take 1 tablet (800 mg total) by mouth 3 (three) times daily. Patient not taking: Reported on 04/25/2015 08/18/13   Jeannett Senior, PA-C  oxyCODONE-acetaminophen (PERCOCET) 10-325 MG per tablet Take 1-2 tablets by mouth every 4 (four) hours as needed for pain. Patient not taking:  Reported on 04/25/2015 07/25/14   Lisette Abu, PA-C     Social History   Social History  . Marital Status: Single    Spouse Name: N/A  . Number of Children: N/A  . Years of Education: N/A   Occupational History  . Not on file.   Social History Main Topics  . Smoking status: Current Every Day Smoker -- 0.50 packs/day  . Smokeless tobacco: Never Used  . Alcohol Use: Yes     Comment: on occasion  .  Drug Use: No  . Sexual Activity: Not on file   Other Topics Concern  . Not on file   Social History Narrative    No family status information on file.   Family History  Problem Relation Age of Onset  . CAD Father   . Bone cancer Mother        ROS:  Full 14 point review of systems complete and found to be negative unless listed above.  Physical Exam: Blood pressure 118/69, pulse 64, temperature 98.4 F (36.9 C), temperature source Oral, resp. rate 16, height 6\' 2"  (1.88 m), weight 235 lb 10.8 oz (106.9 kg), SpO2 98 %.  General: Well developed, well nourished, male in no acute distress Head: Eyes PERRLA, No xanthomas. Normocephalic and atraumatic, oropharynx without edema or exudate.  Lungs: Resp regular and unlabored. Diffuse wheezing.  Heart: RRR no s3, s4, or murmurs..   Neck: No carotid bruits. No lymphadenopathy. No JVD. Abdomen: Bowel sounds present, abdomen soft and non-tender without masses or hernias noted. Msk:  No spine or cva tenderness. No weakness, no joint deformities or effusions. Extremities: No clubbing, cyanosis or edema. DP/PT/Radials 2+ and equal bilaterally. Neuro: Alert and oriented X 3. No focal deficits noted. Psych:  Good affect, responds appropriately Skin: No rashes or lesions noted.  Labs:   Lab Results  Component Value Date   WBC 6.9 04/25/2015   HGB 14.7 04/25/2015   HCT 44.1 04/25/2015   MCV 87.3 04/25/2015   PLT 209 04/25/2015   No results for input(s): INR in the last 72 hours.  Recent Labs Lab 04/26/15 0911  NA 141  K 4.7  CL 109  CO2 24  BUN 7  CREATININE 0.91  CALCIUM 9.2  GLUCOSE 157*   No results found for: MG  Recent Labs  04/26/15 0411 04/26/15 0911  TROPONINI <0.03 <0.03    Recent Labs  04/25/15 2257  TROPIPOC 0.00   PRO B NATRIURETIC PEPTIDE (BNP)  Date/Time Value Ref Range Status  04/15/2013 08:50 AM 11.1 0 - 125 pg/mL Final   Lab Results  Component Value Date   CHOL  12/09/2007    146        ATP  III CLASSIFICATION:  <200     mg/dL   Desirable  200-239  mg/dL   Borderline High  >=240    mg/dL   High   HDL 53 12/09/2007   LDLCALC  12/09/2007    76        Total Cholesterol/HDL:CHD Risk Coronary Heart Disease Risk Table                     Men   Women  1/2 Average Risk   3.4   3.3   TRIG 85 12/09/2007   Lab Results  Component Value Date   DDIMER <0.27 04/25/2015    Echo:04/15/2013  LV EF: 55% -  60%  ------------------------------------------------------------ Indications:   CVA 436.  ------------------------------------------------------------ History:  Risk factors: Current tobacco use. Hypertension.  ------------------------------------------------------------ Study Conclusions  - Left ventricle: The cavity size was normal. Wall thickness was increased in a pattern of mild LVH. Systolic function was normal. The estimated ejection fraction was in the range of 55% to 60%. Wall motion was normal; there were no regional wall motion abnormalities. Left ventricular diastolic function parameters were normal. - Atrial septum: No defect or patent foramen ovale was identified. Impressions:  - No cardiac source of emboli was indentified.  ECG:   Vent. rate 72 BPM PR interval 169 ms QRS duration 88 ms QT/QTc 408/446 ms P-R-T axes 83 20 54  Radiology:  Dg Chest 2 View  04/25/2015  CLINICAL DATA:  Left-sided chest pain since this morning EXAM: CHEST  2 VIEW COMPARISON:  07/25/2014 FINDINGS: Mild scarring in the right lung. Atelectasis seen previously has diminished. There is no edema, consolidation, effusion, or pneumothorax. Normal heart size and aortic contours. IMPRESSION: No active cardiopulmonary disease. Electronically Signed   By: Monte Fantasia M.D.   On: 04/25/2015 23:57    Cath 12/10/2007 Coronary arteriography:  On fluoroscopy, no calcification was seen. 1. Left main: Normal and bifurcated. 2. Circumflex: The circumflex was free  of disease. It gave rise to 2  OM vessels that were free of disease. 3. LAD: The LAD extended down to the apex of the heart and the distal  third of the LAD was small in comparison to the other vessels, but  there was no evidence of occlusive disease. There were 2 diagonals  that came off relatively proximally, and they were also free of  disease. 4. Right coronary artery: Normal. There was a moderate PDA and 2  small posterolateral vessels that were free of disease.  CONCLUSION: 1. No evidence of coronary disease. 2. Mild-to-moderate LV dysfunction with an ejection fraction of 35-  40%.  I cannot explain the etiology of the decreased EF. His only potential factor would be alcohol intake which I have discussed in detail with him. He will need to be placed on an ACE, and he is already on low-dose beta blockers (Lopressor 25 mg b.i.d.). He will need to watch for worsening bradycardia should the beta blockers be increased, and I did take the liberty of starting him on lisinopril 5 mg b.i.d. His blood pressure is relatively low, but hopefully on a split dose of lisinopril, he will be able to tolerate this.  I plan to check a BMP in the morning.   ASSESSMENT AND PLAN:     1. Chest pain - His chest pain has both typical and atypical features. It initially started as a left-sided sharp pain yesterday however gradually Developed a substernal chest pressure. Her pressure has been ongoing since then and intermittently radiates to his left shoulder. Associated symptoms include shortness of breath. EKG without acute T or ST abnormality. Troponin negative.  - Consider include possible COPD exacerbation. - Cardiac risk factors include untreated hypertension, ongoing tobacco abuse and immature family history of cardiac disease. - Will get Lexiscan today. Pending echocardiogram.  2. Hx of  NICM - The patient had cath 12/10/2007  for chest pain  and exertional SOB which showed normal coronaries with mild to moderate LV dysfunction with EF of 35-40% --> placed on BB and ACE. Last echocardiogram 04/15/2013 shows left ventricular function of 55-60% with mild LVH.  - Pending repeat echo. No sign of HF.   3. HTN - Upon presentation his BP was 148/83. Now stable.   4. Tobacco  abuse - Advise smoking cessation and lifestyle modification for more than 15 minutes. He has lost 42lb in last month.   5. Hypokalemia - Resolved  6. Questionable COPD exacerbation  - Breathing improved on neubilizer. Tx per primary.    SignedLeanor Kail, [place] 04/26/2015, 11:27 AM Pager [phone]  Co-Sign MD   Patient seen and examined. Agree with assessment and plan.  Mr. Quashaun Klimczak is a 55 year old African-American male who has a 25 year history of tobacco use and treated hypertension.  In 2009.  Cardiac catheterization revealed normal coronary arteries and he was felt to have a nonischemic cardiopathy with an ejection fraction of 35-40%, which subsequently improved with beta blocker and ACE inhibitor.  She.  His last echo Doppler study in January 2015 revealed mild LVH with normal systolic function with an ejection fraction of 55-60%.  Patient recently has developed  sharp left-sided somewhat atypical chest pain.  Yesterday, he developed more of a dull chest pressure midsternally.  He experienced a re-syncopal/syncopal spell.  His chest pain improved in the emergency room but was not completely gone following nitroglycerin.  Troponins have been negative as well as a d-dimer.  The patient does have some recent wheezing.  He still experiences mild residual chest discomfort.  There is a family history for premature CAD with his father suffering a heart attack and stroke in his 69s.  His ECG on presentation did not reveal any acute ST-T abnormalities and showed normal sinus rhythm. Presently on exam, he still has residual wheezing. m.  His chest x-ray  revealed mild scarring in the right lung without active disease.  The patient has been nothing by mouth this morning.  We will proceed with a nuclear stress test today.  Risk stratification of his chest pain and evaluate for potential ischemic etiology.  Further recommendations will be forthcoming upon completion of his nuclear perfusion study.   Troy Sine, MD, [facility] 04/26/2015 12:38 PM

## 2015-04-26 NOTE — ED Notes (Addendum)
Pt informed we would need to get orthostatic vitals on him and explained what they were. Pt states he gets dizzy sometimes when he stands up.   Daughter- Jarrett Soho, went home, left number [phone]

## 2015-04-26 NOTE — Care Management Note (Signed)
Case Management Note  Patient Details  Name: [name] MRN: [id number] Date of Birth: May 16, 1960  Subjective/Objective:  Pt admitted for chest pain. Pt is the main support for two daughters that are in school.  Pt with several social issues-CM did speak with pt in regards to  [facility] [place] [facility] outpatient [facility] to see if they can contact him. Pt is agreeable to services and CM did call [facility] Liaison. Pt previously used the [employer] [facility] for PCP. CM did call the office and voicemail was full. Pt's Medicaid Card is listed via the Clinic as the PCP.  CSW also worked with pt for additional resources such as housing and transportation.       Action/Plan: CM will continue to call the [facility] to schedule a hospital f/u appointment. No further needs at this time.    Expected Discharge Date:                  Expected Discharge Plan:  Home/Self Care  In-House Referral:  Clinical Social Work  Discharge planning Services  CM Consult, [facility]  Post Acute Care Choice:  NA Choice offered to:  NA  DME Arranged:  N/A DME Agency:  NA  HH Arranged:  NA HH Agency:  NA  Status of Service:  Completed, signed off  Medicare Important Message Given:    Date Medicare IM Given:    Medicare IM give by:    Date Additional Medicare IM Given:    Additional Medicare Important Message give by:     If discussed at [place] of Stay Meetings, dates discussed:    Additional Comments:  Bethena Roys, RN 04/26/2015, 12:28 PM

## 2015-04-26 NOTE — Progress Notes (Signed)
Chaplain reported to 3W to provide spiritual care support for the patient and to complete a Spiritual Care Consult.  It was reported by the Nursing staff that the patient was out of his room gone to a medical appointment.  Chaplain will follow up with patient at a later time. Chaplain Yaakov Guthrie [phone]

## 2015-04-26 NOTE — Progress Notes (Signed)
Patient seen and examined. Admitted after midnight secondary to CP. Patient with typical and atypical features; heart score of 4 and so far neg troponin with no ischemic changes on EKG. Please refer to H&P written by Dr. Tamala Julian for further info/details on admission.  Plan: -cycling troponin -continue ASA, no b-blockers due to soft BP and positive orthostatic changes  -cardiology consulted; looking to perform inpatient stress test -patient counseled to stopped smoking  -will follow cardiology rec's -with a lot of social stress and difficult situation. CM and SW consulted. -no wheezing or SOB described during my examination. Continue nebulizer therapy.  Barton Dubois [id number]

## 2015-04-27 ENCOUNTER — Observation Stay (HOSPITAL_BASED_OUTPATIENT_CLINIC_OR_DEPARTMENT_OTHER): Payer: Medicaid Other

## 2015-04-27 DIAGNOSIS — Z720 Tobacco use: Secondary | ICD-10-CM | POA: Diagnosis not present

## 2015-04-27 DIAGNOSIS — F17200 Nicotine dependence, unspecified, uncomplicated: Secondary | ICD-10-CM | POA: Diagnosis not present

## 2015-04-27 DIAGNOSIS — R079 Chest pain, unspecified: Secondary | ICD-10-CM

## 2015-04-27 DIAGNOSIS — R0789 Other chest pain: Secondary | ICD-10-CM

## 2015-04-27 DIAGNOSIS — I10 Essential (primary) hypertension: Secondary | ICD-10-CM | POA: Diagnosis not present

## 2015-04-27 DIAGNOSIS — R55 Syncope and collapse: Secondary | ICD-10-CM | POA: Diagnosis not present

## 2015-04-27 MED ORDER — AZITHROMYCIN 250 MG PO TABS: 250.0000 mg | ORAL_TABLET | Freq: Every day | ORAL | Status: DC

## 2015-04-27 MED ORDER — AZITHROMYCIN 250 MG PO TABS
500.0000 mg | ORAL_TABLET | Freq: Every day | ORAL | Status: AC
  Administered 2015-04-27: 500 mg via ORAL
  Filled 2015-04-27: qty 2

## 2015-04-27 MED ORDER — PREDNISONE 20 MG PO TABS
40.0000 mg | ORAL_TABLET | Freq: Every day | ORAL | Status: DC
  Administered 2015-04-27: 40 mg via ORAL
  Filled 2015-04-27: qty 2

## 2015-04-27 MED ORDER — PREDNISONE 20 MG PO TABS: 40.0000 mg | ORAL_TABLET | Freq: Every day | ORAL | Status: DC

## 2015-04-27 NOTE — Progress Notes (Signed)
Patient Name: [name] Date of Encounter: 04/27/2015   SUBJECTIVE  Feeling well. No chest pain, sob or palpitations.   CURRENT MEDS . aspirin EC  81 mg Oral Daily  . [START ON 04/28/2015] azithromycin  250 mg Oral Daily  . enoxaparin (LOVENOX) injection  40 mg Subcutaneous Q24H  . nicotine  21 mg Transdermal Daily  . pantoprazole  40 mg Oral Q1200  . predniSONE  40 mg Oral Q breakfast    OBJECTIVE  Filed Vitals:   04/26/15 1951 04/27/15 0040 04/27/15 0600 04/27/15 0605  BP: 147/79 126/68 148/74   Pulse: 91 65 60   Temp: 99.2 F (37.3 C) 97.7 F (36.5 C) 97.2 F (36.2 C)   TempSrc: Oral Oral Axillary   Resp: 18 18 20    Height:      Weight:    234 lb 6.4 oz (106.323 kg)  SpO2: 97% 95% 99%     Intake/Output Summary (Last 24 hours) at 04/27/15 1221 Last data filed at 04/26/15 2130  Gross per 24 hour  Intake    900 ml  Output   1050 ml  Net   -150 ml   Filed Weights   04/25/15 2244 04/26/15 0323 04/27/15 0605  Weight: 245 lb (111.131 kg) 235 lb 10.8 oz (106.9 kg) 234 lb 6.4 oz (106.323 kg)    PHYSICAL EXAM  General: Pleasant, NAD. Neuro: Alert and oriented X 3. Moves all extremities spontaneously. Psych: Normal affect. HEENT:  Normal  Neck: Supple without bruits or JVD. Lungs:  Resp regular and unlabored, CTA. Heart: RRR no s3, s4, or murmurs. Abdomen: Soft, non-tender, non-distended, BS + x 4.  Extremities: No clubbing, cyanosis or edema. DP/PT/Radials 2+ and equal bilaterally.  Accessory Clinical Findings  CBC  Recent Labs  04/25/15 2253  WBC 6.9  HGB 14.7  HCT 44.1  MCV 87.3  PLT [id number]   Basic Metabolic Panel  Recent Labs  04/25/15 2253 04/26/15 0911  NA 142 141  K 3.4* 4.7  CL 107 109  CO2 23 24  GLUCOSE 123* 157*  BUN 6 7  CREATININE 0.97 0.91  CALCIUM 9.5 9.2   Liver Function Tests No results for input(s): AST, ALT, ALKPHOS, BILITOT, PROT, ALBUMIN in the last 72 hours. No results for input(s): LIPASE, AMYLASE in the  last 72 hours. Cardiac Enzymes  Recent Labs  04/26/15 0411 04/26/15 0911 04/26/15 1534  TROPONINI <0.03 <0.03 <0.03   BNP Invalid input(s): POCBNP D-Dimer  Recent Labs  04/25/15 2253  DDIMER <0.27    TELE  Sinus rhythm with rate mostly in 70-80s, transiently goes to mid 50s.   Echo  Indications:   Chest pain 786.51.  ------------------------------------------------------------------- History:  PMH:  Chronic obstructive pulmonary disease. Risk factors: Current tobacco use. Hypertension.  ------------------------------------------------------------------- Study Conclusions  - Left ventricle: The cavity size was normal. Wall thickness was normal. Systolic function was normal. The estimated ejection fraction was in the range of 55% to 60%. Wall motion was normal; there were no regional wall motion abnormalities. Left ventricular diastolic function parameters were normal. - Right atrium: The atrium was mildly dilated.  Impressions:  - Compared to the prior study, there has been no significant interval change.  Radiology/Studies  Dg Chest 2 View  04/25/2015  CLINICAL DATA:  Left-sided chest pain since this morning EXAM: CHEST  2 VIEW COMPARISON:  07/25/2014 FINDINGS: Mild scarring in the right lung. Atelectasis seen previously has diminished. There is no edema, consolidation, effusion, or pneumothorax. Normal heart  size and aortic contours. IMPRESSION: No active cardiopulmonary disease. Electronically Signed   By: Monte Fantasia M.D.   On: 04/25/2015 23:57   Nm Myocar Multi W/spect W/wall Motion / Ef  04/26/2015  CLINICAL DATA:  Chest pain. Syncope. Prior cardiac catheterization. Tobacco use. Family history of coronary artery disease. Shortness of breath. Nausea. EXAM: MYOCARDIAL IMAGING WITH SPECT (REST AND PHARMACOLOGIC-STRESS) GATED LEFT VENTRICULAR WALL MOTION STUDY LEFT VENTRICULAR EJECTION FRACTION TECHNIQUE: Standard myocardial SPECT imaging was  performed after resting intravenous injection of 10 mCi Tc-36m sestamibi. Subsequently, intravenous infusion of Lexiscan was performed under the supervision of the Cardiology staff. At peak effect of the drug, 30 mCi Tc-4m sestamibi was injected intravenously and standard myocardial SPECT imaging was performed. Quantitative gated imaging was also performed to evaluate left ventricular wall motion, and estimate left ventricular ejection fraction. COMPARISON:  None. FINDINGS: Perfusion: In the cardiac base and mid left ventricular inferior wall, there is a moderate sized matched perfusion defect on stress and rest images suggesting scar. In addition, in the inferoapical segment, there is a small and mild region of reduced activity on stress images the un matched on the rest images. Wall Motion: Normal left ventricular wall motion. No left ventricular dilation. Left Ventricular Ejection Fraction: 61 % End diastolic volume 123456 ml End systolic volume 57 ml IMPRESSION: 1. Inferior wall scar. Small, mild severity focus of reversibility in the inferior apical segment a potentially a small focus of inducible ischemia, but only measuring a 6% difference in counts in this segment. 2. Normal left ventricular wall motion. 3. Left ventricular ejection fraction 61% 4. Low-risk stress test findings*. *2012 Appropriate Use Criteria for Coronary Revascularization Focused Update: J Am Coll Cardiol. N6492421. http://content.airportbarriers.com.aspx?articleid=1201161 Electronically Signed   By: Van Clines M.D.   On: 04/26/2015 15:49    ASSESSMENT AND PLAN  1. Chest pain - Atypical. Resolved.  EKG without acute T or ST abnormality. Troponin negative.  - Likely due to possible COPD (on nebulizer and steroids) exacerbation or bronchitis (with cough and wheezing --> started on abx) - Myoview was low risk with EF of 60%. Inferior wall scar. Small, mild severity focus of reversibility in the inferior apical  segment a potentially a small focus of inducible ischemia, but only measuring a 6% difference in counts in this segment. - Echo with EF of 55-60%, no WM abnormality.  - No need for cath. Likely medical management and outpatient f/u. MD to see.    2. Hx of NICM - The patient had cath 12/10/2007 for chest pain and exertional SOB which showed normal coronaries with mild to moderate LV dysfunction with EF of 35-40% --> placed on BB and ACE. Last echocardiogram 04/15/2013 shows left ventricular function of 55-60% with mild LVH.  - Echo this admission showed stable LV function.   3. HTN - At times elevated. Not on any medications. Consider adding BB/ACE. Sinus rhythm with rate mostly in 70-80s, transiently goes to mid 50s.   4. Tobacco abuse - Advise smoking cessation.   5. Hypokalemia - Resolved   Signed, Bhagat,Bhavinkumar PA-C Pager [phone]   LEXISCAN NUCLEAR IMPRESSION: 1. Inferior wall scar. Small, mild severity focus of reversibility in the inferior apical segment a potentially a small focus of inducible ischemia, but only measuring a 6% difference in counts in this segment.  2. Normal left ventricular wall motion.  3. Left ventricular ejection fraction 61%  4. Low-risk stress test findings*.  Patient seen and examined. Agree with assessment and plan.Low risk nuclear  study.  I have personally reviewed the images and feel most likely this is due to attenuation artifact.  The extent of abnormalities only 1% and the total perfusion deficit is 2%.  LV function is normal without focal segmental wall motion abnormalities.  Tobacco cessation is imperative.  The patient will need to be monitored with reference to blood pressure and potential initiation of treatment as an outpatient.  Okay to discharge the patient today from a cardiovascular perspective.   Troy Sine, MD, [facility] 04/27/2015 2:33 PM

## 2015-04-27 NOTE — Progress Notes (Signed)
Nutrition Brief Note  Patient identified on the Malnutrition Screening Tool (MST) Report. Patient with 13% weight loss over the past 9 months, not significant for the time frame. Patient reports that he is homeless and has recently had a hard time as a single father. He attributes his weight loss to stress. He reports going without eating for 3-4 days a few times, but makes sure that his daughters always have something to eat. Nutrition focused physical exam completed.  No muscle or subcutaneous fat depletion noticed.   Provided patient with a list of food banks and food pantries in [facility] for food assistance once discharged. Patient plans to d/c today.   Wt Readings from Last 15 Encounters:  04/27/15 234 lb 6.4 oz (106.323 kg)  07/21/14 270 lb 6.4 oz (122.653 kg)  04/15/13 265 lb 3.4 oz (120.3 kg)    Body mass index is 30.08 kg/(m^2). Patient meets criteria for obesity based on current BMI.   Current diet order is heart healthy, patient is consuming approximately 100% of meals at this time. Labs and medications reviewed.   No nutrition interventions warranted at this time. If nutrition issues arise, please consult RD.   Molli Barrows, RD, LDN, [place] Pager [phone] After Hours Pager [phone]

## 2015-04-27 NOTE — Discharge Summary (Signed)
Physician Discharge Summary  [name] [id number] DOB: December 26, 1960 DOA: 04/25/2015  PCP: No PCP Per Patient  Admit date: 04/25/2015 Discharge date: 04/27/2015  Recommendations for Outpatient Follow-up:  1.    Discharge Diagnoses:  Principal Problem:   Chest pain Active Problems:   Tobacco abuse   HTN (hypertension)   Hypokalemia   COPD exacerbation (HCC) Acute bronchitis  Discharge Condition: stab;e  Diet recommendation: heart healthy  Filed Weights   04/25/15 2244 04/26/15 0323 04/27/15 0605  Weight: 111.131 kg (245 lb) 106.9 kg (235 lb 10.8 oz) 106.323 kg (234 lb 6.4 oz)    History of present illness:  55 year old male with past medical history significant for HTN, who presented with complaints of left-sided that started at approximately 6:30 AM. Describes pain as a dull pressure that was initially a 9 out of 10 on the pain scale. Noted associated symptoms of nausea, shortness of breath, dizziness/lightheadedness, and near syncope. Denies diaphoresis, fever, chills, or vomiting. Patient was noted to have have a witnessed syncopal event while trying to get out of the car to come into the ED. He had a previous cardiac catheterization in 2009 but there was no evidence of coronary artery disease and his last echocardiogram was in January 2015 oriented EF of 55-60%. Patient denies any recent travel or sick contacts. Patient reports smoking less than a pack of cigarettes per day. He does note that he's had significant weight loss of approximately 42 pounds in the last month. States the weight loss was not warranted and feels that something is wrong. He reports associated enlarged lymph nodes around his throat that are tender to the touch.  Upon admission into the emergency department patient is evaluated and initial EKG showed sinus rhythm similar to previous EKG and troponin was negative. A d-dimer was obtained and seen to be within normal limits. Patient was given morphine,  nitroglycerin, and 2 breathing treatments with minimal change in chest pain. Following dose of IV Dilaudid patient reported being chest pain-free.  Hospital Course:   Chest pain: MI ruled out. nuclear stress test with inferior wall scar, small, mild severity focus of reversible ischemia, but small. Cardiology felt low risk study. Echo normal. developed cough and is wheezing. May be contributing. Will start abx for acute bronchitis and pred and bronchodilators for COPD exacerbation.    Tobacco abuse: counseled against  HTN (hypertension)  Hypokalemia corrected.  COPD exacerbation ([place]): see above. Will need rx for inhaler at discharge Nodule left middle finger: looks like callous, but describes no repetitive pressure in the area. Can f/u derm as outpt if desired  Procedures:  none  Consultations:  cardiology  Discharge Exam: Filed Vitals:   04/27/15 0040 04/27/15 0600  BP: 126/68 148/74  Pulse: 65 60  Temp: 97.7 F (36.5 C) 97.2 F (36.2 C)  Resp: 18 20    General:a and o Cardiovascular: RRR Respiratory: CTA  Discharge Instructions   Discharge Instructions    Diet general    Complete by:  As directed      Increase activity slowly    Complete by:  As directed           Current Discharge Medication List    START taking these medications   Details  azithromycin (ZITHROMAX) 250 MG tablet Take 1 tablet (250 mg total) by mouth daily. Qty: 4 each, Refills: 0    predniSONE (DELTASONE) 20 MG tablet Take 2 tablets (40 mg total) by mouth daily with breakfast. Until gone Qty: 4  tablet, Refills: 0      STOP taking these medications     albuterol-ipratropium (COMBIVENT) 18-103 MCG/ACT inhaler      cephALEXin (KEFLEX) 500 MG capsule      HYDROmorphone (DILAUDID) 8 MG tablet      ibuprofen (ADVIL,MOTRIN) 800 MG tablet      oxyCODONE-acetaminophen (PERCOCET) 10-325 MG per tablet        No Known Allergies Follow-up Information    Follow up with your primary  care provider In 2 weeks.       The results of significant diagnostics from this hospitalization (including imaging, microbiology, ancillary and laboratory) are listed below for reference.    Significant Diagnostic Studies: Dg Chest 2 View  04/25/2015  CLINICAL DATA:  Left-sided chest pain since this morning EXAM: CHEST  2 VIEW COMPARISON:  07/25/2014 FINDINGS: Mild scarring in the right lung. Atelectasis seen previously has diminished. There is no edema, consolidation, effusion, or pneumothorax. Normal heart size and aortic contours. IMPRESSION: No active cardiopulmonary disease. Electronically Signed   By: Monte Fantasia M.D.   On: 04/25/2015 23:57   Nm Myocar Multi W/spect W/wall Motion / Ef  04/26/2015  CLINICAL DATA:  Chest pain. Syncope. Prior cardiac catheterization. Tobacco use. Family history of coronary artery disease. Shortness of breath. Nausea. EXAM: MYOCARDIAL IMAGING WITH SPECT (REST AND PHARMACOLOGIC-STRESS) GATED LEFT VENTRICULAR WALL MOTION STUDY LEFT VENTRICULAR EJECTION FRACTION TECHNIQUE: Standard myocardial SPECT imaging was performed after resting intravenous injection of 10 mCi Tc-53m sestamibi. Subsequently, intravenous infusion of Lexiscan was performed under the supervision of the Cardiology staff. At peak effect of the drug, 30 mCi Tc-67m sestamibi was injected intravenously and standard myocardial SPECT imaging was performed. Quantitative gated imaging was also performed to evaluate left ventricular wall motion, and estimate left ventricular ejection fraction. COMPARISON:  None. FINDINGS: Perfusion: In the cardiac base and mid left ventricular inferior wall, there is a moderate sized matched perfusion defect on stress and rest images suggesting scar. In addition, in the inferoapical segment, there is a small and mild region of reduced activity on stress images the un matched on the rest images. Wall Motion: Normal left ventricular wall motion. No left ventricular dilation.  Left Ventricular Ejection Fraction: 61 % End diastolic volume 123456 ml End systolic volume 57 ml IMPRESSION: 1. Inferior wall scar. Small, mild severity focus of reversibility in the inferior apical segment a potentially a small focus of inducible ischemia, but only measuring a 6% difference in counts in this segment. 2. Normal left ventricular wall motion. 3. Left ventricular ejection fraction 61% 4. Low-risk stress test findings*. *2012 Appropriate Use Criteria for Coronary Revascularization Focused Update: J Am Coll Cardiol. [id number]. http://content.airportbarriers.com.aspx?articleid=1201161 Electronically Signed   By: Van Clines M.D.   On: 04/26/2015 15:49   Echo Left ventricle: The cavity size was normal. Wall thickness was normal. Systolic function was normal. The estimated ejection fraction was in the range of 55% to 60%. Wall motion was normal; there were no regional wall motion abnormalities. Left ventricular diastolic function parameters were normal. - Right atrium: The atrium was mildly dilated.  Impressions:  - Compared to the prior study, there has been no significant interval change. Microbiology: No results found for this or any previous visit (from the past 240 hour(s)).   Labs: Basic Metabolic Panel:  Recent Labs Lab 04/25/15 2253 04/26/15 0911  NA 142 141  K 3.4* 4.7  CL 107 109  CO2 23 24  GLUCOSE 123* 157*  BUN 6 7  CREATININE  0.97 0.91  CALCIUM 9.5 9.2   Liver Function Tests: No results for input(s): AST, ALT, ALKPHOS, BILITOT, PROT, ALBUMIN in the last 168 hours. No results for input(s): LIPASE, AMYLASE in the last 168 hours. No results for input(s): AMMONIA in the last 168 hours. CBC:  Recent Labs Lab 04/25/15 2253  WBC 6.9  HGB 14.7  HCT 44.1  MCV 87.3  PLT 209   Cardiac Enzymes:  Recent Labs Lab 04/26/15 0411 04/26/15 0911 04/26/15 1534  TROPONINI <0.03 <0.03 <0.03   BNP: BNP (last 3 results) No results  for input(s): BNP in the last 8760 hours.  ProBNP (last 3 results) No results for input(s): PROBNP in the last 8760 hours.  CBG: No results for input(s): GLUCAP in the last 168 hours.     SignedDelfina Redwood MD.  [facility] 04/27/2015, 2:49 PM

## 2015-04-27 NOTE — Progress Notes (Signed)
  Echocardiogram 2D Echocardiogram has been performed.  Jennette Dubin 04/27/2015, 8:12 AM

## 2015-04-27 NOTE — Discharge Instructions (Signed)
Chest Wall Pain Chest wall pain is pain in or around the bones and muscles of your chest. Sometimes, an injury causes this pain. Sometimes, the cause may not be known. This pain may take several weeks or longer to get better. HOME CARE INSTRUCTIONS  Pay attention to any changes in your symptoms. Take these actions to help with your pain:   Rest as told by your health care provider.   Avoid activities that cause pain. These include any activities that use your chest muscles or your abdominal and side muscles to lift heavy items.   If directed, apply ice to the painful area:  Put ice in a plastic bag.  Place a towel between your skin and the bag.  Leave the ice on for 20 minutes, 2-3 times per day.  Take over-the-counter and prescription medicines only as told by your health care provider.  Do not use tobacco products, including cigarettes, chewing tobacco, and e-cigarettes. If you need help quitting, ask your health care provider.  Keep all follow-up visits as told by your health care provider. This is important. SEEK MEDICAL CARE IF:  You have a fever.  Your chest pain becomes worse.  You have new symptoms. SEEK IMMEDIATE MEDICAL CARE IF:  You have nausea or vomiting.  You feel sweaty or light-headed.  You have a cough with phlegm (sputum) or you cough up blood.  You develop shortness of breath.   This information is not intended to replace advice given to you by your health care provider. Make sure you discuss any questions you have with your health care provider.   Document Released: 03/12/2005 Document Revised: 12/01/2014 Document Reviewed: 06/07/2014 Elsevier Interactive Patient Education 2016 Elsevier Inc.  Chest Wall Pain Chest wall pain is pain in or around the bones and muscles of your chest. Sometimes, an injury causes this pain. Sometimes, the cause may not be known. This pain may take several weeks or longer to get better. HOME CARE INSTRUCTIONS  Pay  attention to any changes in your symptoms. Take these actions to help with your pain:   Rest as told by your health care provider.   Avoid activities that cause pain. These include any activities that use your chest muscles or your abdominal and side muscles to lift heavy items.   If directed, apply ice to the painful area:  Put ice in a plastic bag.  Place a towel between your skin and the bag.  Leave the ice on for 20 minutes, 2-3 times per day.  Take over-the-counter and prescription medicines only as told by your health care provider.  Do not use tobacco products, including cigarettes, chewing tobacco, and e-cigarettes. If you need help quitting, ask your health care provider.  Keep all follow-up visits as told by your health care provider. This is important. SEEK MEDICAL CARE IF:  You have a fever.  Your chest pain becomes worse.  You have new symptoms. SEEK IMMEDIATE MEDICAL CARE IF:  You have nausea or vomiting.  You feel sweaty or light-headed.  You have a cough with phlegm (sputum) or you cough up blood.  You develop shortness of breath.   This information is not intended to replace advice given to you by your health care provider. Make sure you discuss any questions you have with your health care provider.   Document Released: 03/12/2005 Document Revised: 12/01/2014 Document Reviewed: 06/07/2014 Elsevier Interactive Patient Education 2016 Elsevier Inc.  Chest Wall Pain Chest wall pain is pain in or around  the bones and muscles of your chest. Sometimes, an injury causes this pain. Sometimes, the cause may not be known. This pain may take several weeks or longer to get better. HOME CARE Pay attention to any changes in your symptoms. Take these actions to help with your pain:  Rest as told by your doctor.  Avoid activities that cause pain. Try not to use your chest, belly (abdominal), or side muscles to lift heavy things.  If directed, apply ice to the  painful area:  Put ice in a plastic bag.  Place a towel between your skin and the bag.  Leave the ice on for 20 minutes, 2-3 times per day.  Take over-the-counter and prescription medicines only as told by your doctor.  Do not use tobacco products, including cigarettes, chewing tobacco, and e-cigarettes. If you need help quitting, ask your doctor.  Keep all follow-up visits as told by your doctor. This is important. GET HELP IF:  You have a fever.  Your chest pain gets worse.  You have new symptoms. GET HELP RIGHT AWAY IF:  You feel sick to your stomach (nauseous) or you throw up (vomit).  You feel sweaty or light-headed.  You have a cough with phlegm (sputum) or you cough up blood.  You are short of breath.   This information is not intended to replace advice given to you by your health care provider. Make sure you discuss any questions you have with your health care provider.   Document Released: 08/29/2007 Document Revised: 12/01/2014 Document Reviewed: 06/07/2014 Elsevier Interactive Patient Education [employer].

## 2015-04-27 NOTE — Progress Notes (Addendum)
[facility] PROGRESS NOTE  ALTAN BODLEY [id number] DOB: [birth year] DOA: 04/25/2015 PCP: No PCP Per Patient  Assessment/Plan:  Principal Problem:   Chest pain: nuclear stress test with inferior wall scar, small, mild severity focus of reversible ischemia, but small. Still with central CP. Echo pending. Await recs from cardiology. Has developed cough and is wheezing today. May be contributing. Will start abx for acute bronchitis and pred for COPD exacerbation. Continue HHN. If no further cardiac workup, will discharge home. SW has consulted for homelessness issues Active Problems:   Tobacco abuse: pt would like rx for chantix at discharge   HTN (hypertension)   Hypokalemia corrected.   COPD exacerbation ([place]): see above. Will need rx for inhaler at discharge Nodule left middle finger: looks like callous, but describes no repetitive pressure in the area. Can f/u derm as outpt if desired  HPI/Subjective: C/o substernal chest pressure, that waxes and wanes, but has not resolved since admission. Cough started a few days ago. Wants nodule on left middle finger which started a few months ago "cut off".  Objective: Filed Vitals:   04/27/15 0040 04/27/15 0600  BP: 126/68 148/74  Pulse: 65 60  Temp: 97.7 F (36.5 C) 97.2 F (36.2 C)  Resp: 18 20    Intake/Output Summary (Last 24 hours) at 04/27/15 0951 Last data filed at 04/26/15 2130  Gross per 24 hour  Intake    900 ml  Output   1050 ml  Net   -150 ml   Filed Weights   04/25/15 2244 04/26/15 0323 04/27/15 0605  Weight: 111.131 kg (245 lb) 106.9 kg (235 lb 10.8 oz) 106.323 kg (234 lb 6.4 oz)    Exam:   General:  Coughing. A and o. Breathing nonlabored  Cardiovascular: RRR without MGR  Respiratory: bilateral wheeze and congestion. Good air movement. No rhonchi or rales  Abdomen: s, nt, nd  Ext: no CCE. Left third finger with hyperpigmented, hard nodule. No warmth, tenderness or fluctuance  Basic  Metabolic Panel:  Recent Labs Lab 04/25/15 2253 04/26/15 0911  NA 142 141  K 3.4* 4.7  CL 107 109  CO2 23 24  GLUCOSE 123* 157*  BUN 6 7  CREATININE 0.97 0.91  CALCIUM 9.5 9.2   Liver Function Tests: No results for input(s): AST, ALT, ALKPHOS, BILITOT, PROT, ALBUMIN in the last 168 hours. No results for input(s): LIPASE, AMYLASE in the last 168 hours. No results for input(s): AMMONIA in the last 168 hours. CBC:  Recent Labs Lab 04/25/15 2253  WBC 6.9  HGB 14.7  HCT 44.1  MCV 87.3  PLT 209   Cardiac Enzymes:  Recent Labs Lab 04/26/15 0411 04/26/15 0911 04/26/15 1534  TROPONINI <0.03 <0.03 <0.03   BNP (last 3 results) No results for input(s): BNP in the last 8760 hours.  ProBNP (last 3 results) No results for input(s): PROBNP in the last 8760 hours.  CBG: No results for input(s): GLUCAP in the last 168 hours.  No results found for this or any previous visit (from the past 240 hour(s)).   Studies: Dg Chest 2 View  04/25/2015  CLINICAL DATA:  Left-sided chest pain since this morning EXAM: CHEST  2 VIEW COMPARISON:  07/25/2014 FINDINGS: Mild scarring in the right lung. Atelectasis seen previously has diminished. There is no edema, consolidation, effusion, or pneumothorax. Normal heart size and aortic contours. IMPRESSION: No active cardiopulmonary disease. Electronically Signed   By: Monte Fantasia M.D.   On: 04/25/2015 23:57   Nm  Myocar Multi W/spect W/wall Motion / Ef  04/26/2015  CLINICAL DATA:  Chest pain. Syncope. Prior cardiac catheterization. Tobacco use. Family history of coronary artery disease. Shortness of breath. Nausea. EXAM: MYOCARDIAL IMAGING WITH SPECT (REST AND PHARMACOLOGIC-STRESS) GATED LEFT VENTRICULAR WALL MOTION STUDY LEFT VENTRICULAR EJECTION FRACTION TECHNIQUE: Standard myocardial SPECT imaging was performed after resting intravenous injection of 10 mCi Tc-23m sestamibi. Subsequently, intravenous infusion of Lexiscan was performed under the  supervision of the Cardiology staff. At peak effect of the drug, 30 mCi Tc-51m sestamibi was injected intravenously and standard myocardial SPECT imaging was performed. Quantitative gated imaging was also performed to evaluate left ventricular wall motion, and estimate left ventricular ejection fraction. COMPARISON:  None. FINDINGS: Perfusion: In the cardiac base and mid left ventricular inferior wall, there is a moderate sized matched perfusion defect on stress and rest images suggesting scar. In addition, in the inferoapical segment, there is a small and mild region of reduced activity on stress images the un matched on the rest images. Wall Motion: Normal left ventricular wall motion. No left ventricular dilation. Left Ventricular Ejection Fraction: 61 % End diastolic volume 123456 ml End systolic volume 57 ml IMPRESSION: 1. Inferior wall scar. Small, mild severity focus of reversibility in the inferior apical segment a potentially a small focus of inducible ischemia, but only measuring a 6% difference in counts in this segment. 2. Normal left ventricular wall motion. 3. Left ventricular ejection fraction 61% 4. Low-risk stress test findings*. *2012 Appropriate Use Criteria for Coronary Revascularization Focused Update: J Am Coll Cardiol. [id number]. http://content.airportbarriers.com.aspx?articleid=1201161 Electronically Signed   By: Van Clines M.D.   On: 04/26/2015 15:49    Scheduled Meds: . aspirin EC  81 mg Oral Daily  . enoxaparin (LOVENOX) injection  40 mg Subcutaneous Q24H  . nicotine  21 mg Transdermal Daily  . pantoprazole  40 mg Oral Q1200  . pneumococcal 23 valent vaccine  0.5 mL Intramuscular Tomorrow-1000   Continuous Infusions:   Time spent: 25 minutes  [facility] [email or web address], password [facility] 04/27/2015, 9:51 AM

## 2015-06-11 ENCOUNTER — Emergency Department (HOSPITAL_COMMUNITY)
Admission: EM | Admit: 2015-06-11 | Discharge: 2015-06-11 | Disposition: A | Discharge status: Home / Self Care | Payer: Medicaid Other | Attending: Emergency Medicine | Admitting: Emergency Medicine

## 2015-06-11 ENCOUNTER — Encounter (HOSPITAL_COMMUNITY): Payer: Self-pay

## 2015-06-11 DIAGNOSIS — S4991XA Unspecified injury of right shoulder and upper arm, initial encounter: Secondary | ICD-10-CM | POA: Diagnosis not present

## 2015-06-11 DIAGNOSIS — Y9389 Activity, other specified: Secondary | ICD-10-CM | POA: Diagnosis not present

## 2015-06-11 DIAGNOSIS — Y998 Other external cause status: Secondary | ICD-10-CM | POA: Diagnosis not present

## 2015-06-11 DIAGNOSIS — I10 Essential (primary) hypertension: Secondary | ICD-10-CM | POA: Diagnosis not present

## 2015-06-11 DIAGNOSIS — F17200 Nicotine dependence, unspecified, uncomplicated: Secondary | ICD-10-CM | POA: Insufficient documentation

## 2015-06-11 DIAGNOSIS — X500XXA Overexertion from strenuous movement or load, initial encounter: Secondary | ICD-10-CM | POA: Insufficient documentation

## 2015-06-11 DIAGNOSIS — Y9289 Other specified places as the place of occurrence of the external cause: Secondary | ICD-10-CM | POA: Diagnosis not present

## 2015-06-11 NOTE — ED Notes (Signed)
The pt cannot  Wait any longer.  He has no ride  He may return tomorrow

## 2015-06-11 NOTE — ED Notes (Signed)
Onset 1 week ago pt was lifting box and felt something pull in his right arm below AC.  Pt unable to lift with right arm, lift anything or use arm.

## 2015-06-13 ENCOUNTER — Encounter (HOSPITAL_COMMUNITY): Payer: Self-pay | Admitting: *Deleted

## 2015-06-13 ENCOUNTER — Emergency Department (HOSPITAL_COMMUNITY)
Admission: EM | Admit: 2015-06-13 | Discharge: 2015-06-13 | Disposition: A | Discharge status: Home / Self Care | Payer: Medicaid Other | Attending: Emergency Medicine | Admitting: Emergency Medicine

## 2015-06-13 ENCOUNTER — Emergency Department (HOSPITAL_COMMUNITY): Payer: Medicaid Other

## 2015-06-13 DIAGNOSIS — S6991XA Unspecified injury of right wrist, hand and finger(s), initial encounter: Secondary | ICD-10-CM | POA: Diagnosis not present

## 2015-06-13 DIAGNOSIS — S59901A Unspecified injury of right elbow, initial encounter: Secondary | ICD-10-CM | POA: Diagnosis present

## 2015-06-13 DIAGNOSIS — Z7952 Long term (current) use of systemic steroids: Secondary | ICD-10-CM | POA: Diagnosis not present

## 2015-06-13 DIAGNOSIS — Y9389 Activity, other specified: Secondary | ICD-10-CM | POA: Insufficient documentation

## 2015-06-13 DIAGNOSIS — F17200 Nicotine dependence, unspecified, uncomplicated: Secondary | ICD-10-CM | POA: Diagnosis not present

## 2015-06-13 DIAGNOSIS — Z9889 Other specified postprocedural states: Secondary | ICD-10-CM | POA: Diagnosis not present

## 2015-06-13 DIAGNOSIS — S59911A Unspecified injury of right forearm, initial encounter: Secondary | ICD-10-CM | POA: Diagnosis not present

## 2015-06-13 DIAGNOSIS — Z792 Long term (current) use of antibiotics: Secondary | ICD-10-CM | POA: Insufficient documentation

## 2015-06-13 DIAGNOSIS — L729 Follicular cyst of the skin and subcutaneous tissue, unspecified: Secondary | ICD-10-CM | POA: Insufficient documentation

## 2015-06-13 DIAGNOSIS — I10 Essential (primary) hypertension: Secondary | ICD-10-CM | POA: Diagnosis not present

## 2015-06-13 DIAGNOSIS — X509XXA Other and unspecified overexertion or strenuous movements or postures, initial encounter: Secondary | ICD-10-CM | POA: Diagnosis not present

## 2015-06-13 DIAGNOSIS — Y990 Civilian activity done for income or pay: Secondary | ICD-10-CM | POA: Diagnosis not present

## 2015-06-13 DIAGNOSIS — Y9289 Other specified places as the place of occurrence of the external cause: Secondary | ICD-10-CM | POA: Diagnosis not present

## 2015-06-13 MED ORDER — IBUPROFEN 800 MG PO TABS: 800.0000 mg | ORAL_TABLET | Freq: Three times a day (TID) | ORAL | Status: DC

## 2015-06-13 MED ORDER — OXYCODONE-ACETAMINOPHEN 5-325 MG PO TABS: 1.0000 | ORAL_TABLET | ORAL | Status: DC | PRN

## 2015-06-13 MED ORDER — KETOROLAC TROMETHAMINE 60 MG/2ML IM SOLN
60.0000 mg | Freq: Once | INTRAMUSCULAR | Status: AC
  Administered 2015-06-13: 60 mg via INTRAMUSCULAR
  Filled 2015-06-13: qty 2

## 2015-06-13 NOTE — Progress Notes (Signed)
Orthopedic Tech Progress Note Patient Details:  [name] December 28, 1960 [id number]  Ortho Devices Type of Ortho Device: Sling immobilizer Ortho Device/Splint Interventions: Application   [name] 06/13/2015, 6:11 PM

## 2015-06-13 NOTE — ED Notes (Signed)
Pt states that he lifted a heavy box 1 week ago. States that he felt his muscle pull in his right elbow.

## 2015-06-13 NOTE — ED Provider Notes (Signed)
CSN: [id number]     Arrival date & time 06/13/15  1617 History  By signing my name below, I, [name], attest that this documentation has been prepared under the direction and in the presence of non-physician practitioner, Victor Hopping, PA-C. Electronically Signed: Dora Ramsey, Scribe. 06/13/2015. 5:23 PM.    Chief Complaint  Patient presents with  . Elbow Injury    The history is provided by the patient. No language interpreter was used.     HPI Comments: [name] is a 55 y.o. male with h/o HTN who presents to the Emergency Department complaining of sudden onset, waxing and waning, severe, throbbing, 10/10 right elbow pain for the last week. He endorses associated right hand swelling as well. Pt states that he picked up a small box at work last week and felt a "pull" in his right arm; he endorses experiencing nausea at the time of the incident. He notes that he thought he pulled a muscle, but the pain has persisted so he came to the ER. He states that he came to the ER 2 days ago for the same issue but was not seen because he had to leave. He reports that he is unable to pick up a can of soda with his right hand; he has been using his left hand for everything this past week due to right elbow pain. He endorses significant right elbow pain exacerbation with bending and movement; he states that his right elbow pain is 3/10 if kept still. He endorses difficulty sleeping since onset of right elbow pain. Pt has had 14 prior surgeries but does not believe that he has ever had surgery on his right arm. Pt has been icing his right elbow without relief. He denies neuro deficits or any other associated symptoms.   Pt also complains of right middle finger cyst for the last two months. He notes that he attempted to cut out his cyst recently due to pain but experienced significant bleeding so he stopped. Pt denies numbness in his right middle finger or any other associated symptoms  Past  Medical History  Diagnosis Date  . Hypertension    Past Surgical History  Procedure Laterality Date  . Hip surgery    . Knee surgery    . Cardiac catheterization     Family History  Problem Relation Age of Onset  . CAD Father   . Bone cancer Mother    Social History  Substance Use Topics  . Smoking status: Current Every Day Smoker -- 0.50 packs/day  . Smokeless tobacco: Never Used  . Alcohol Use: Yes     Comment: on occasion    Review of Systems  Gastrointestinal: Positive for nausea (resolved).  Musculoskeletal: Positive for arthralgias (right elbow, right middle finger).  Neurological: Negative for numbness.      Allergies  Review of patient's allergies indicates no known allergies.  Home Medications   Prior to Admission medications   Medication Sig Start Date End Date Taking? Authorizing Provider  azithromycin (ZITHROMAX) 250 MG tablet Take 1 tablet (250 mg total) by mouth daily. 04/27/15   Victor Redwood, MD  ibuprofen (ADVIL,MOTRIN) 800 MG tablet Take 1 tablet (800 mg total) by mouth 3 (three) times daily. 06/13/15   Victor Carrara C Chima Astorino, PA-C  oxyCODONE-acetaminophen (PERCOCET/ROXICET) 5-325 MG tablet Take 1 tablet by mouth every 4 (four) hours as needed for severe pain. 06/13/15   Victor Ohagan C Idy Rawling, PA-C  predniSONE (DELTASONE) 20 MG tablet Take 2 tablets (40 mg  total) by mouth daily with breakfast. Until gone 04/27/15   Victor Redwood, MD   BP 156/90 mmHg  Pulse 67  Temp(Src) 97.9 F (36.6 C) (Oral)  Resp 18  SpO2 96% Physical Exam  Constitutional: He appears well-developed and well-nourished. No distress.  HENT:  Head: Normocephalic and atraumatic.  Eyes: Conjunctivae are normal.  Cardiovascular: Normal rate, regular rhythm and intact distal pulses.   Pulmonary/Chest: Effort normal.  Musculoskeletal:  Tenderness to the right anterior elbow and forearm with associated swelling Swelling in the right hand CMS intact distal to the injury Nodule on the palmar side  of third finger on the right  Neurological: He is alert. He has normal reflexes.  Skin: Skin is warm and dry. He is not diaphoretic.  Psychiatric: He has a normal mood and affect. His behavior is normal.  Nursing note and vitals reviewed.   ED Course  Procedures (including critical care time)  DIAGNOSTIC STUDIES: Oxygen Saturation is 96% on RA, adequate by my interpretation.    COORDINATION OF CARE: 5:21 PM Will administer Toradol 60 mg injection. Will apply ice. Discussed treatment plan with pt at bedside and pt agreed to plan.    Imaging Review Dg Elbow Complete Right  06/13/2015  CLINICAL DATA:  Right elbow pain for 1 week upper pulling something EXAM: RIGHT ELBOW - COMPLETE 3+ VIEW COMPARISON:  None. FINDINGS: Four views of the right elbow submitted. No acute fracture or subluxation. Spurring of olecranon is noted. No posterior fat pad sign. IMPRESSION: No acute fracture or subluxation.  Spurring of olecranon is noted. Electronically Signed   By: [name] M.D.   On: 06/13/2015 17:09   I have personally reviewed and evaluated these images as part of my medical decision-making.   EKG Interpretation None      MDM   Final diagnoses:  Elbow injury, right, initial encounter    [name] presents with right elbow pain due to an injury a week ago.  No abnormalities were found on the patient's x-ray. Suspect soft tissue injury, possibly due to a tendon. Patient placed in sling immobilizer, given pain management, and referred to orthopedics. Patient voiced understanding of these instructions and is comfortable with discharge.  Filed Vitals:   06/13/15 1626  BP: 156/90  Pulse: 67  Temp: 97.9 F (36.6 C)  TempSrc: Oral  Resp: 18  SpO2: 96%     I personally performed the services described in this documentation, which was scribed in my presence. The recorded information has been reviewed and is accurate.   Victor Bender, PA-C 06/13/15 1859  Victor Reichert,  MD 06/13/15 2123

## 2015-06-13 NOTE — ED Notes (Signed)
Declined W/C at D/C and was escorted to lobby by RN. 

## 2015-06-13 NOTE — ED Notes (Signed)
Pt also c/o cyst to right hand middle finger knuckle, states "it needs to come off."

## 2015-06-13 NOTE — Discharge Instructions (Signed)
You have been seen today for an elbow injury. Your imaging showed no abnormalities. Follow-up with orthopedics as soon as possible for reevaluation and chronic management. Follow up with PCP as needed. Return to ED should symptoms worsen.  RESOURCE GUIDE  Chronic Pain Problems: Contact [facility]  [phone] Patients need to be referred by their primary care doctor.  Insufficient Money for Medicine: Contact United Way:  call "211" or [place] [phone].  No Primary Care Doctor: - Call [facility]  [phone] - can help you locate a primary care doctor that  accepts your insurance, provides certain services, etc. - Physician Referral Service- [phone]  Agencies that provide inexpensive medical care: - [facility]  [facility]  [phone] - [facility]  [phone] - [facility]  [phone] - Planned Parenthood  [phone] - [facility]  [phone]  [place] Providers: - [facility]- [address]  [phone], Mon-Fri 9am-7pm, Sat 9am-1pm - [place], [address] [place]  [place], [address] [place]  [place]- [address]  [place], [address], [phone]  Only accepts [place] patients after they have their name  applied to their card  Self Pay (no insurance) in [place]: - Sickle Cell Patients: Dr Kevan Ny, [facility]  [place], [facility]- [place]  [facility] [place]- [id number] [place] [address], [facility]- see information above (Speak to [employer] if you do not have insurance)       -  [facility]- [place],  [place],  [place], [place]  Dr Vista Lawman-  [address], [place], [place] [facility]- [address] [phone]       -  [facility]- [address], [place], also [address] [phone]       -    [facility]- [address], [place], 1st & 3rd Saturday   every month, 10am-1pm  1) Find a Doctor and Pay Out of Pocket Although you won't have to find out who is covered by your insurance plan, it is a good idea to ask around and get recommendations. You will then need to call the office and see if the doctor you have chosen will accept you as a new patient and what types of options they offer for patients who are self-pay. Some doctors offer discounts or will set up payment plans for their patients who do not have insurance, but you will need to ask so you aren't surprised when you get to your appointment.  2) Contact Your Local Health Department Not all health departments have doctors that can see patients for sick visits, but many do, so it is worth a call to see if  yours does. If you don't know where your local health department is, you can check in your phone book. The CDC also has a tool to help you locate your state's health department, and many state websites also have listings of all of their local health departments.  3) Find a [facility] If your illness is not likely to be very severe or complicated, you may want to try a walk in clinic. These are popping up all over the country in pharmacies, drugstores, and shopping centers. They're usually staffed by nurse practitioners or physician assistants that have been trained to treat common illnesses and complaints. They're usually fairly quick and inexpensive. However, if you have serious medical issues or chronic medical problems, these are  probably not your best option  STD Testing - [place], [facility], [address] [place], phone [phone] or [phone].  Monday - Friday, call for an appointment. - [place], [facility], [address], [place], phone [phone] or [phone].  Monday - Friday, call for an appointment.  Abuse/Neglect: - [place] [phone] - [place] [phone] (After Hours)  Emergency Shelter:  [facility] [phone]  Maternity Homes: - [facility] [phone] - [place] [phone]  MRSA Hotline #:   4428161806  [facility] [place] [facility] Dept. [address]         [place] Phone:  [phone]                                  Phone:  [phone]                   Phone:  [phone]  [place], [place] in [place], [address],                                  [place] [phone] or [phone] (After Hours)   [place]  Substance Abuse Resources: - Alcohol and Drug Services  [phone] - [place] [phone] - [facility] [place] [phone] - Residential & Outpatient Substance Abuse Program  [phone]  Psychological Services: - [place]  [place]  [place], [phone] [place]. [address] [place], [place]:  [phone] or [phone], [employer]  Dental Assistance  If unable to pay or uninsured,  contact:  [facility] or [facility]. to become qualified for the adult dental clinic.  Patients with Medicaid: [facility] [phone] [address], [place] [address] [phone]  If unable to pay, or uninsured, contact [facility] [phone]) or [place] [phone] in [place], [place] in [facility]) to become qualified for the adult dental clinic   Other [place]- [place], [place], [place], [address], [place], [place], 2nd and 4th Thursday of the month at 6:30am.  10 clients each day by appointment, can sometimes see walk-in patients if someone does not show for an appointment. [facility]- [address] [place] [address], [place], [address], [place], [place], [place], [address], [place] Department- [place] Department- [place] Department- [phone]

## 2015-07-30 ENCOUNTER — Encounter (HOSPITAL_COMMUNITY): Payer: Self-pay | Admitting: *Deleted

## 2015-07-30 ENCOUNTER — Emergency Department (HOSPITAL_COMMUNITY)
Admission: EM | Admit: 2015-07-30 | Discharge: 2015-07-30 | Disposition: A | Discharge status: Home / Self Care | Payer: Medicaid Other | Attending: Emergency Medicine | Admitting: Emergency Medicine

## 2015-07-30 DIAGNOSIS — Z791 Long term (current) use of non-steroidal anti-inflammatories (NSAID): Secondary | ICD-10-CM | POA: Insufficient documentation

## 2015-07-30 DIAGNOSIS — M25521 Pain in right elbow: Secondary | ICD-10-CM

## 2015-07-30 DIAGNOSIS — G8918 Other acute postprocedural pain: Secondary | ICD-10-CM | POA: Diagnosis not present

## 2015-07-30 DIAGNOSIS — I10 Essential (primary) hypertension: Secondary | ICD-10-CM | POA: Insufficient documentation

## 2015-07-30 DIAGNOSIS — Z9889 Other specified postprocedural states: Secondary | ICD-10-CM | POA: Insufficient documentation

## 2015-07-30 DIAGNOSIS — F17200 Nicotine dependence, unspecified, uncomplicated: Secondary | ICD-10-CM | POA: Diagnosis not present

## 2015-07-30 MED ORDER — OXYCODONE HCL 5 MG PO TABS: 5.0000 mg | ORAL_TABLET | Freq: Four times a day (QID) | ORAL | Status: DC | PRN

## 2015-07-30 MED ORDER — OXYCODONE HCL 5 MG PO TABS
10.0000 mg | ORAL_TABLET | Freq: Once | ORAL | Status: AC
  Administered 2015-07-30: 10 mg via ORAL
  Filled 2015-07-30: qty 2

## 2015-07-30 MED ORDER — HYDROMORPHONE HCL 1 MG/ML IJ SOLN
1.0000 mg | Freq: Once | INTRAMUSCULAR | Status: AC
  Administered 2015-07-30: 1 mg via INTRAVENOUS
  Filled 2015-07-30: qty 1

## 2015-07-30 MED ORDER — MORPHINE SULFATE (PF) 4 MG/ML IV SOLN
4.0000 mg | Freq: Once | INTRAVENOUS | Status: AC
  Administered 2015-07-30: 4 mg via INTRAVENOUS
  Filled 2015-07-30: qty 1

## 2015-07-30 NOTE — ED Notes (Signed)
Pt states that he had surgery yesterday to reattach his bicep. Pt reports continued pain despite taking his pain medication. PMS intact at triage. Pt states that he has noticed some swelling in his fingers.

## 2015-07-30 NOTE — Progress Notes (Signed)
Orthopedic Tech Progress Note Patient Details:  [name] 05/12/1960 [id number] Applied fiberglass posterior long arm splint to RUE.  Placed sterile non-stick dressing on surgical wound in Rt. antecubital space.  Pulses, sensation, motion intact before and after splinting.  Capillary refill less than 2 seconds before and after splinting.  Placed splinted RUE in pt. supplied arm sling. Ortho Devices Type of Ortho Device: Post (long arm) splint, Arm sling Ortho Device/Splint Location: RUE Ortho Device/Splint Interventions: Application   Darrol Poke 07/30/2015, 11:20 PM

## 2015-07-30 NOTE — Discharge Instructions (Signed)
1. Medications: Oxycodone for severe pain every 6 hours along with 1000mg  Tylenol, usual home medications 2. Treatment: rest, ice, elevate and use brace, drink plenty of fluids, gentle stretching 3. Follow Up: It is important for you to follow-up with Dr. Erlinda Hong for further evaluation and management of your symptoms. Return to ED for worsening symptoms.

## 2015-07-30 NOTE — ED Provider Notes (Signed)
CSN: [id number]     Arrival date & time 07/30/15  1815 History   First MD Initiated Contact with Patient 07/30/15 1853     Chief Complaint  Patient presents with  . Arm Injury     (Consider location/radiation/quality/duration/timing/severity/associated sxs/prior Treatment) HPI [name] is a 55 y.o. male history of hypertension, here for evaluation of right elbow pain. Patient reports he had surgery yesterday done by Dr. Erlinda Hong, to repair a torn biceps tendon. He reports since completion of the surgery he has had intense right elbow pain not relieved by hydrocodone. He denies any numbness or weakness. He does report he has been up all night with the pain. Nothing makes this problem better, movement worsens his discomfort.  Past Medical History  Diagnosis Date  . Hypertension    Past Surgical History  Procedure Laterality Date  . Hip surgery    . Knee surgery    . Cardiac catheterization     Family History  Problem Relation Age of Onset  . CAD Father   . Bone cancer Mother    Social History  Substance Use Topics  . Smoking status: Current Every Day Smoker -- 0.50 packs/day  . Smokeless tobacco: Never Used  . Alcohol Use: Yes     Comment: on occasion    Review of Systems A 10 point review of systems was completed and was negative except for pertinent positives and negatives as mentioned in the history of present illness     Allergies  Review of patient's allergies indicates no known allergies.  Home Medications   Prior to Admission medications   Medication Sig Start Date End Date Taking? Authorizing Provider  HYDROcodone-acetaminophen (NORCO/VICODIN) 5-325 MG tablet take 1 tablet by mouth every 12 hours if needed for pain 07/11/15  Yes Historical Provider, MD  ibuprofen (ADVIL,MOTRIN) 800 MG tablet Take 1 tablet (800 mg total) by mouth 3 (three) times daily. 06/13/15  Yes Shawn C Joy, PA-C  oxyCODONE (ROXICODONE) 5 MG immediate release tablet Take 1 tablet (5 mg  total) by mouth every 6 (six) hours as needed for severe pain. 07/30/15   Hannah Muthersbaugh, PA-C   BP 137/88 mmHg  Pulse 62  Temp(Src) 98.1 F (36.7 C) (Oral)  Resp 18  SpO2 95% Physical Exam  Constitutional: He is oriented to person, place, and time. He appears well-developed and well-nourished.  HENT:  Head: Normocephalic and atraumatic.  Mouth/Throat: Oropharynx is clear and moist.  Eyes: Conjunctivae are normal. Pupils are equal, round, and reactive to light. Right eye exhibits no discharge. Left eye exhibits no discharge. No scleral icterus.  Neck: Neck supple.  Cardiovascular: Normal rate, regular rhythm and normal heart sounds.   Pulmonary/Chest: Effort normal and breath sounds normal. No respiratory distress. He has no wheezes. He has no rales.  Abdominal: Soft. There is no tenderness.  Musculoskeletal: He exhibits no tenderness.  Posterior Splint in place. Grip strength intact and equal bilaterally. Sensation is intact to light touch. Distal pulses intact with brisk cap refill.  Neurological: He is alert and oriented to person, place, and time.  Cranial Nerves II-XII grossly intact  Skin: Skin is warm and dry. No rash noted.  Psychiatric: He has a normal mood and affect.  Nursing note and vitals reviewed.   ED Course  Procedures (including critical care time) Labs Review Labs Reviewed - No data to display  Imaging Review No results found. I have personally reviewed and evaluated these images and lab results as part of my  medical decision-making.   EKG Interpretation None      MDM  [name] is a 55 y.o. male with reported biceps tendon repair done yesterday by Dr. Erlinda Hong, here for pain control not relieved by home Vicodin. Discussed with attending, Dr. Alvino Chapel. Doubt infection this early from surgery. Remains neurovascularly intact. Patient given morphine and Dilaudid. Signed out to oncoming provider, Muthersbaugh PA-C for further pain control and  subsequent disposition. Final diagnoses:  Elbow pain, right  Postoperative state        Comer Locket, PA-C 07/31/15 [place], MD 07/31/15 [phone]

## 2015-07-30 NOTE — ED Provider Notes (Signed)
Care assumed from [facility], [place].  [name] is a 55 y.o. male presents s/p biceps tendon repair yesterday by Erlinda Hong.  Pt is taking Vicodin at home without relief.  Patient reports no sleep in 24 hours due to severe pain. Denies fever, chills, vomiting. He has associated nausea due to severe pain. Pt has been given Morphine and Dilaudid.     Physical Exam  BP 167/108 mmHg  Pulse 67  Temp(Src) 98.1 F (36.7 C) (Oral)  Resp 18  SpO2 100%  Physical Exam   Face to face Exam:   General: Awake, uncomfortable appearing  HEENT: Atraumatic  Resp: Normal effort  Abd: Nondistended  Neuro:No focal weakness; equal grip strength  MSK: right arm splint in place; no erythema above the splint Lymph: No adenopathy    ED Course  Procedures  Elbow pain, right  Postoperative state  MDM  Plan: Pain control and home.  Pt to follow with Xu on Monday.    11 PM  Splint removed in its entirety. Wound was visualized. Stitches intact. Clean and dry. No dehiscence. No erythema, induration, increased warmth or purulent drainage. Posterior long-arm splint placed by ortho tech.  Nausea resolved with improvement of pain. No vomiting here. Tolerating by mouth.  11:28 PM Patient with adequate pain relief after Dilaudid and oxycodone. Will change home regimen to oxycodone IR to that he can appropriately dose narcotic and Tylenol. He is to follow-up with Dr. Erlinda Hong on Monday. Discussed reasons to return to the emergency department including fevers, chills, worsening pain or other concerns.  BP 154/91 mmHg  Pulse 58  Temp(Src) 98.1 F (36.7 C) (Oral)  Resp 18  SpO2 96%   Abigail Butts, PA-C 07/30/15 [place], MD 07/30/15 2342

## 2015-07-30 NOTE — ED Notes (Signed)
Pt ambulated to the bathroom.  

## 2015-07-30 NOTE — ED Notes (Signed)
Paged Othro tec

## 2015-08-05 ENCOUNTER — Emergency Department (HOSPITAL_COMMUNITY)
Admission: EM | Admit: 2015-08-05 | Discharge: 2015-08-05 | Disposition: A | Discharge status: Home / Self Care | Payer: Medicaid Other | Attending: Emergency Medicine | Admitting: Emergency Medicine

## 2015-08-05 ENCOUNTER — Encounter (HOSPITAL_COMMUNITY): Payer: Self-pay | Admitting: Emergency Medicine

## 2015-08-05 DIAGNOSIS — I10 Essential (primary) hypertension: Secondary | ICD-10-CM | POA: Diagnosis not present

## 2015-08-05 DIAGNOSIS — G8918 Other acute postprocedural pain: Secondary | ICD-10-CM | POA: Diagnosis not present

## 2015-08-05 DIAGNOSIS — M79631 Pain in right forearm: Secondary | ICD-10-CM | POA: Diagnosis present

## 2015-08-05 DIAGNOSIS — L7632 Postprocedural hematoma of skin and subcutaneous tissue following other procedure: Secondary | ICD-10-CM | POA: Insufficient documentation

## 2015-08-05 DIAGNOSIS — Z791 Long term (current) use of non-steroidal anti-inflammatories (NSAID): Secondary | ICD-10-CM | POA: Insufficient documentation

## 2015-08-05 DIAGNOSIS — F17200 Nicotine dependence, unspecified, uncomplicated: Secondary | ICD-10-CM | POA: Insufficient documentation

## 2015-08-05 MED ORDER — KETOROLAC TROMETHAMINE 60 MG/2ML IM SOLN
60.0000 mg | Freq: Once | INTRAMUSCULAR | Status: AC
  Administered 2015-08-05: 60 mg via INTRAMUSCULAR
  Filled 2015-08-05: qty 2

## 2015-08-05 MED ORDER — OXYCODONE-ACETAMINOPHEN 5-325 MG PO TABS: 1.0000 | ORAL_TABLET | ORAL | Status: DC | PRN

## 2015-08-05 MED ORDER — HYDROMORPHONE HCL 1 MG/ML IJ SOLN
2.0000 mg | Freq: Once | INTRAMUSCULAR | Status: AC
  Administered 2015-08-05: 2 mg via INTRAMUSCULAR
  Filled 2015-08-05: qty 2

## 2015-08-05 MED ORDER — DIAZEPAM 10 MG PO TABS: 10.0000 mg | ORAL_TABLET | Freq: Three times a day (TID) | ORAL | Status: DC | PRN

## 2015-08-05 MED ORDER — DIAZEPAM 5 MG PO TABS
5.0000 mg | ORAL_TABLET | Freq: Once | ORAL | Status: AC
  Administered 2015-08-05: 5 mg via ORAL
  Filled 2015-08-05: qty 1

## 2015-08-05 NOTE — ED Notes (Addendum)
Reattach bicep surgery by Dr. Tomie China. To right arm. States he has been on vicodin for pain. States he was here a few days ago and was given something that did take edge off. Was given px and went to pharmacy and states he was told pharmacy could not fill px due similarity. New swelling to arm. Hand is visibly puffy.

## 2015-08-05 NOTE — Discharge Instructions (Signed)
Pain Medicine Instructions °HOW CAN PAIN MEDICINE AFFECT ME? °You were given a prescription for pain medicine. This medicine may make you tired or drowsy and may affect your ability to think clearly. Pain medicine may also affect your ability to drive or perform certain physical activities. It may not be possible to make all of your pain go away, but you should be comfortable enough to move, breathe, and take care of yourself. °HOW OFTEN SHOULD I TAKE PAIN MEDICINE AND HOW MUCH SHOULD I TAKE? °Take pain medicine only as directed by your health care provider and only as needed for pain. °You do not need to take pain medicine if you are not having pain, unless directed by your health care provider. °You can take less than the prescribed dose if you find that a smaller amount of medicine controls your pain. °WHAT RESTRICTIONS DO I HAVE WHILE TAKING PAIN MEDICINE? °Follow these instructions after you start taking pain medicine, while you are taking the medicine, and for 8 hours after you stop taking the medicine: °Do not drive. °Do not operate machinery. °Do not operate power tools. °Do not sign legal documents. °Do not drink alcohol. °Do not take sleeping pills. °Do not supervise children by yourself. °Do not participate in activities that require climbing or being in high places. °Do not enter a body of water--such as a lake, river, ocean, spa, or swimming pool--without an adult nearby who can monitor and help you. °HOW CAN I KEEP OTHERS SAFE WHILE I AM TAKING PAIN MEDICINE? °Store your pain medicine as directed by your health care provider. Make sure that it is placed where children and pets cannot reach it. °Never share your pain medicine with anyone. °Do not save any leftover pills. If you have any leftover pain medicine, get rid of it or destroy it as directed by your health care provider. °WHAT ELSE DO I NEED TO KNOW ABOUT TAKING PAIN MEDICINE? °Use a stool softener if you become constipated from your pain  medicine. Increasing your intake of fruits and vegetables will also help with constipation. °Write down the times when you take your pain medicine. Look at the times before you take your next dose of medicine. It is easy to become confused while on pain medicine. Recording the times helps you to avoid an overdose. °If your pain is severe, do not try to treat it yourself by taking more pills than instructed on your prescription. Contact your health care provider for help. °You may have been prescribed a pain medicine that contains acetaminophen. Do not take any other acetaminophen while taking this medicine. An overdose of acetaminophen can result in severe liver damage. Acetaminophen is found in many over-the-counter (OTC) and prescription medicines. If you are taking any medicines in addition to your pain medicine, check the active ingredients on those medicines to see if acetaminophen is listed. °WHEN SHOULD I CALL MY HEALTH CARE PROVIDER? °Your medicine is not helping to make the pain go away. °You vomit or have diarrhea shortly after taking the medicine. °You develop new pain in areas that did not hurt before. °You have an allergic reaction to your medicine. This may include: °Itchiness. °Swelling. °Dizziness. °Developing a new rash. °WHEN SHOULD I CALL 911 OR GO TO THE EMERGENCY ROOM? °You feel dizzy or you faint. °You are very confused or disoriented. °You repeatedly vomit. °Your skin or lips turn pale or bluish in color. °You have shortness of breath or you are breathing much more slowly than usual. °You have   a severe allergic reaction to your medicine. This includes: °Developing tongue swelling. °Having difficulty breathing. °  °This information is not intended to replace advice given to you by your health care provider. Make sure you discuss any questions you have with your health care provider. °  °Document Released: 06/18/2000 Document Revised: 07/27/2014 Document Reviewed: 01/14/2014 °Elsevier  Interactive Patient Education ©2016 [employer] Inc. ° °

## 2015-08-05 NOTE — Progress Notes (Signed)
Orthopedic Tech Progress Note Patient Details:  [name] 16-Mar-1961 [id number]  Ortho Devices Type of Ortho Device: Ace wrap, Arm sling, Long arm splint Ortho Device/Splint Interventions: Application   [name] 08/05/2015, 3:30 PM

## 2015-08-05 NOTE — ED Provider Notes (Signed)
CSN: [id number]     Arrival date & time 08/05/15  1017 History   First MD Initiated Contact with Patient 08/05/15 1200     Chief Complaint  Patient presents with  . Post-op Problem     (Consider location/radiation/quality/duration/timing/severity/associated sxs/prior Treatment) HPI Comments: Patient is a 55 year old male presenting today 7 days postop right forearm tendon repair returning for worsening pain. Patient has been taking hydrocodone at home without improvement in his pain.  Patient states that he has been unable to sleep in that arm is all being despite taking the hydrocodone and Flexeril.  Also when he woke up this morning he had swelling of his right hand and it feels like his splint is taking into his arm.  Pain is worse with any movement of the arm or movement of the hands. He denies any fever or chills. Currently pain is a 10 out of 10.  He is a follow-up appointment with his surgeon until Monday  The history is provided by the patient.    Past Medical History  Diagnosis Date  . Hypertension    Past Surgical History  Procedure Laterality Date  . Hip surgery    . Knee surgery    . Cardiac catheterization     Family History  Problem Relation Age of Onset  . CAD Father   . Bone cancer Mother    Social History  Substance Use Topics  . Smoking status: Current Every Day Smoker -- 0.50 packs/day  . Smokeless tobacco: Never Used  . Alcohol Use: Yes     Comment: on occasion    Review of Systems  All other systems reviewed and are negative.     Allergies  Review of patient's allergies indicates no known allergies.  Home Medications   Prior to Admission medications   Medication Sig Start Date End Date Taking? Authorizing Provider  HYDROcodone-acetaminophen (NORCO/VICODIN) 5-325 MG tablet take 1 tablet by mouth every 12 hours if needed for pain 07/11/15   Historical Provider, MD  ibuprofen (ADVIL,MOTRIN) 800 MG tablet Take 1 tablet (800 mg total) by mouth 3  (three) times daily. 06/13/15   Shawn C Joy, PA-C  oxyCODONE (ROXICODONE) 5 MG immediate release tablet Take 1 tablet (5 mg total) by mouth every 6 (six) hours as needed for severe pain. 07/30/15   Hannah Muthersbaugh, PA-C   BP 132/91 mmHg  Pulse 56  Temp(Src) 97.8 F (36.6 C) (Oral)  Resp 18  SpO2 93% Physical Exam  Constitutional: He is oriented to person, place, and time. He appears well-developed and well-nourished.  Appears to be uncomfortable and in pain  HENT:  Head: Normocephalic and atraumatic.  Cardiovascular: Normal rate.   Pulmonary/Chest: Effort normal.  Musculoskeletal:       Arms: Bruising present on the lower forearm and upper arm most consistent with injury from the splint. Capillary refill less than 3 seconds.  Neurological: He is alert and oriented to person, place, and time.  Skin: Skin is warm and dry.  Psychiatric: He has a normal mood and affect. His behavior is normal.  Nursing note and vitals reviewed.   ED Course  Procedures (including critical care time) Labs Review Labs Reviewed - No data to display  Imaging Review No results found. I have personally reviewed and evaluated these images and lab results as part of my medical decision-making.   EKG Interpretation None      MDM   Final diagnoses:  Acute post-operative pain    Patient is a 55 year old  male who had repair of tendon injury approximate 7 days ago presenting today for severe pain not controlled with hydrocodone at home. He was seen in the emergency room approximately 4 days ago for uncontrolled pain at that time was given a new prescription however the pharmacy would not fill it because he had filled a prescription for hydrocodone. He has continued to take the hydrocodone at home without improvement in pain in this morning when he woke up his right arm was swollen and the pain was more severe. He denies any fever or chills. The cast is digging into his arm so it was removed and patient  has significant swelling to the forearm and hand but appears to be neurovascularly intact. Capillary refill less than 3 seconds and palpable radial pulse. Patient given IM pain medication.  Will discuss with Dr. Riesa Pope, MD 08/05/15 [phone]

## 2015-08-20 ENCOUNTER — Encounter (HOSPITAL_COMMUNITY): Payer: Self-pay | Admitting: Emergency Medicine

## 2015-08-20 ENCOUNTER — Emergency Department (HOSPITAL_COMMUNITY)
Admission: EM | Admit: 2015-08-20 | Discharge: 2015-08-20 | Disposition: A | Discharge status: Home / Self Care | Payer: Medicaid Other | Attending: Emergency Medicine | Admitting: Emergency Medicine

## 2015-08-20 ENCOUNTER — Emergency Department (HOSPITAL_COMMUNITY): Payer: Medicaid Other

## 2015-08-20 DIAGNOSIS — I10 Essential (primary) hypertension: Secondary | ICD-10-CM | POA: Insufficient documentation

## 2015-08-20 DIAGNOSIS — G40909 Epilepsy, unspecified, not intractable, without status epilepticus: Secondary | ICD-10-CM | POA: Diagnosis present

## 2015-08-20 DIAGNOSIS — F10220 Alcohol dependence with intoxication, uncomplicated: Secondary | ICD-10-CM | POA: Diagnosis not present

## 2015-08-20 DIAGNOSIS — F17200 Nicotine dependence, unspecified, uncomplicated: Secondary | ICD-10-CM | POA: Insufficient documentation

## 2015-08-20 DIAGNOSIS — Z791 Long term (current) use of non-steroidal anti-inflammatories (NSAID): Secondary | ICD-10-CM | POA: Insufficient documentation

## 2015-08-20 DIAGNOSIS — F10920 Alcohol use, unspecified with intoxication, uncomplicated: Secondary | ICD-10-CM

## 2015-08-20 DIAGNOSIS — R569 Unspecified convulsions: Secondary | ICD-10-CM

## 2015-08-20 LAB — COMPREHENSIVE METABOLIC PANEL
ALBUMIN: 4.2 g/dL (ref 3.5–5.0)
ALT: 16 U/L — AB (ref 17–63)
AST: 16 U/L (ref 15–41)
Alkaline Phosphatase: 51 U/L (ref 38–126)
Anion gap: 10 (ref 5–15)
BUN: 7 mg/dL (ref 6–20)
CHLORIDE: 110 mmol/L (ref 101–111)
CO2: 22 mmol/L (ref 22–32)
CREATININE: 0.94 mg/dL (ref 0.61–1.24)
Calcium: 9.1 mg/dL (ref 8.9–10.3)
GFR calc Af Amer: >60 mL/min (ref >60)
GFR calc non Af Amer: >60 mL/min (ref >60)
GLUCOSE: 90 mg/dL (ref 65–99)
POTASSIUM: 3.8 mmol/L (ref 3.5–5.1)
SODIUM: 142 mmol/L (ref 135–145)
Total Bilirubin: 0.9 mg/dL (ref 0.3–1.2)
Total Protein: 7 g/dL (ref 6.5–8.1)

## 2015-08-20 LAB — CBC WITH DIFFERENTIAL/PLATELET
Basophils Absolute: 0 10*3/uL (ref 0.0–0.1)
Basophils Relative: 0 %
EOS PCT: 1 %
Eosinophils Absolute: 0.1 10*3/uL (ref 0.0–0.7)
HEMATOCRIT: 41.8 % (ref 39.0–52.0)
Hemoglobin: 14.7 g/dL (ref 13.0–17.0)
LYMPHS ABS: 2.5 10*3/uL (ref 0.7–4.0)
LYMPHS PCT: 31 %
MCH: 30.2 pg (ref 26.0–34.0)
MCHC: 35.2 g/dL (ref 30.0–36.0)
MCV: 86 fL (ref 78.0–100.0)
MONO ABS: 0.5 10*3/uL (ref 0.1–1.0)
Monocytes Relative: 6 %
NEUTROS ABS: 5 10*3/uL (ref 1.7–7.7)
Neutrophils Relative %: 62 %
PLATELETS: 252 10*3/uL (ref 150–400)
RBC: 4.86 MIL/uL (ref 4.22–5.81)
RDW: 13.8 % (ref 11.5–15.5)
WBC: 8 10*3/uL (ref 4.0–10.5)

## 2015-08-20 LAB — RAPID URINE DRUG SCREEN, HOSP PERFORMED
AMPHETAMINES: NOT DETECTED
BARBITURATES: NOT DETECTED
BENZODIAZEPINES: POSITIVE — AB
Cocaine: NOT DETECTED
Opiates: NOT DETECTED
Tetrahydrocannabinol: NOT DETECTED

## 2015-08-20 LAB — URINALYSIS, ROUTINE W REFLEX MICROSCOPIC
Bilirubin Urine: NEGATIVE
GLUCOSE, UA: NEGATIVE mg/dL
HGB URINE DIPSTICK: NEGATIVE
Ketones, ur: NEGATIVE mg/dL
LEUKOCYTES UA: NEGATIVE
Nitrite: NEGATIVE
PH: 6 (ref 5.0–8.0)
PROTEIN: NEGATIVE mg/dL
Specific Gravity, Urine: 1.013 (ref 1.005–1.030)

## 2015-08-20 LAB — ETHANOL: Alcohol, Ethyl (B): 179 mg/dL — ABNORMAL HIGH (ref <5)

## 2015-08-20 MED ORDER — PHENYLEPHRINE 200 MCG/ML FOR PRIAPISM / HYPOTENSION: 200.0000 ug | INTRAMUSCULAR | Status: DC | PRN

## 2015-08-20 MED ORDER — OXYCODONE-ACETAMINOPHEN 5-325 MG PO TABS
1.0000 | ORAL_TABLET | Freq: Once | ORAL | Status: AC
  Administered 2015-08-20: 1 via ORAL
  Filled 2015-08-20: qty 1

## 2015-08-20 NOTE — ED Notes (Signed)
Bed: [id number] Expected date:  Expected time:  Means of arrival:  Comments: EMS 55yo M seizure
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
## 2015-08-20 NOTE — ED Notes (Signed)
Brought in by EMS from home with c/o seizures.  Per EMS, pt has been drinking liquor tonight.  Per pt's family, pt has hx of seizures---- and pt "has hx of seizure attacks everytime he drinks liquor"; family reported that he has "passed out".  Pt has had unresponsiveness (seizure attack) x 2 with EMS.  Pt was given Midazolam 5 mg IN en route.  Pt has had not taken seizure medication for a month now, per family.

## 2015-08-20 NOTE — Discharge Instructions (Signed)
Please follow-up with the neurologist to help make a decision whether you need to be on medication to prevent seizures. Please do not drink any alcohol whatsoever.  Alcohol Intoxication Alcohol intoxication occurs when the amount of alcohol that a person has consumed impairs his or her ability to mentally and physically function. Alcohol directly impairs the normal chemical activity of the brain. Drinking large amounts of alcohol can lead to changes in mental function and behavior, and it can cause many physical effects that can be harmful.  Alcohol intoxication can range in severity from mild to very severe. Various factors can affect the level of intoxication that occurs, such as the person's age, gender, weight, frequency of alcohol consumption, and the presence of other medical conditions (such as diabetes, seizures, or heart conditions). Dangerous levels of alcohol intoxication may occur when people drink large amounts of alcohol in a short period (binge drinking). Alcohol can also be especially dangerous when combined with certain prescription medicines or "recreational" drugs. SIGNS AND SYMPTOMS Some common signs and symptoms of mild alcohol intoxication include:  Loss of coordination.  Changes in mood and behavior.  Impaired judgment.  Slurred speech. As alcohol intoxication progresses to more severe levels, other signs and symptoms will appear. These may include:  Vomiting.  Confusion and impaired memory.  Slowed breathing.  Seizures.  Loss of consciousness. DIAGNOSIS  Your health care provider will take a medical history and perform a physical exam. You will be asked about the amount and type of alcohol you have consumed. Blood tests will be done to measure the concentration of alcohol in your blood. In many places, your blood alcohol level must be lower than 80 mg/dL (0.08%) to legally drive. However, many dangerous effects of alcohol can occur at much lower levels.  TREATMENT    People with alcohol intoxication often do not require treatment. Most of the effects of alcohol intoxication are temporary, and they go away as the alcohol naturally leaves the body. Your health care provider will monitor your condition until you are stable enough to go home. Fluids are sometimes given through an IV access tube to help prevent dehydration.  HOME CARE INSTRUCTIONS  Do not drive after drinking alcohol.  Stay hydrated. Drink enough water and fluids to keep your urine clear or pale yellow. Avoid caffeine.   Only take over-the-counter or prescription medicines as directed by your health care provider.  SEEK MEDICAL CARE IF:   You have persistent vomiting.   You do not feel better after a few days.  You have frequent alcohol intoxication. Your health care provider can help determine if you should see a substance use treatment counselor. SEEK IMMEDIATE MEDICAL CARE IF:   You become shaky or tremble when you try to stop drinking.   You shake uncontrollably (seizure).   You throw up (vomit) blood. This may be bright red or may look like black coffee grounds.   You have blood in your stool. This may be bright red or may appear as a black, tarry, bad smelling stool.   You become lightheaded or faint.  MAKE SURE YOU:   Understand these instructions.  Will watch your condition.  Will get help right away if you are not doing well or get worse.   This information is not intended to replace advice given to you by your health care provider. Make sure you discuss any questions you have with your health care provider.   Document Released: 12/20/2004 Document Revised: 11/12/2012 Document Reviewed: 08/15/2012  Elsevier Interactive Patient Education [employer].  Seizure, Adult A seizure is abnormal electrical activity in the brain. Seizures usually last from 30 seconds to 2 minutes. There are various types of seizures. Before a seizure, you may have a warning  sensation (aura) that a seizure is about to occur. An aura may include the following symptoms:   Fear or anxiety.  Nausea.  Feeling like the room is spinning (vertigo).  Vision changes, such as seeing flashing lights or spots. Common symptoms during a seizure include:  A change in attention or behavior (altered mental status).  Convulsions with rhythmic jerking movements.  Drooling.  Rapid eye movements.  Grunting.  Loss of bladder and bowel control.  Bitter taste in the mouth.  Tongue biting. After a seizure, you may feel confused and sleepy. You may also have an injury resulting from convulsions during the seizure. HOME CARE INSTRUCTIONS   If you are given medicines, take them exactly as prescribed by your health care provider.  Keep all follow-up appointments as directed by your health care provider.  Do not swim or drive or engage in risky activity during which a seizure could cause further injury to you or others until your health care provider says it is OK.  Get adequate rest.  Teach friends and family what to do if you have a seizure. They should:  Lay you on the ground to prevent a fall.  Put a cushion under your head.  Loosen any tight clothing around your neck.  Turn you on your side. If vomiting occurs, this helps keep your airway clear.  Stay with you until you recover.  Know whether or not you need emergency care. SEEK IMMEDIATE MEDICAL CARE IF:  The seizure lasts longer than 5 minutes.  The seizure is severe or you do not wake up immediately after the seizure.  You have an altered mental status after the seizure.  You are having more frequent or worsening seizures. Someone should drive you to the emergency department or call local emergency services (911 in [place] MAKE SURE YOU:  Understand these instructions.  Will watch your condition.  Will get help right away if you are not doing well or get worse.   This information is not  intended to replace advice given to you by your health care provider. Make sure you discuss any questions you have with your health care provider.   Document Released: 03/09/2000 Document Revised: 04/02/2014 Document Reviewed: 10/22/2012 Elsevier Interactive Patient Education [employer].

## 2015-11-10 ENCOUNTER — Other Ambulatory Visit: Payer: Self-pay | Admitting: Orthopaedic Surgery

## 2015-11-10 DIAGNOSIS — M25562 Pain in left knee: Secondary | ICD-10-CM

## 2015-11-18 ENCOUNTER — Other Ambulatory Visit: Payer: Medicaid Other

## 2015-11-29 ENCOUNTER — Inpatient Hospital Stay: Admission: RE | Admit: 2015-11-29 | Payer: Medicaid Other | Source: Ambulatory Visit

## 2016-01-30 ENCOUNTER — Encounter (INDEPENDENT_AMBULATORY_CARE_PROVIDER_SITE_OTHER): Payer: Self-pay | Admitting: Orthopaedic Surgery

## 2016-01-30 ENCOUNTER — Ambulatory Visit (INDEPENDENT_AMBULATORY_CARE_PROVIDER_SITE_OTHER): Payer: Medicaid Other | Admitting: Orthopaedic Surgery

## 2016-01-30 DIAGNOSIS — M79661 Pain in right lower leg: Secondary | ICD-10-CM | POA: Diagnosis not present

## 2016-01-30 MED ORDER — NAPROXEN 500 MG PO TABS: 500.0000 mg | ORAL_TABLET | Freq: Two times a day (BID) | ORAL | 3 refills | Status: DC

## 2016-01-30 MED ORDER — DIAZEPAM 10 MG PO TABS: 10.0000 mg | ORAL_TABLET | Freq: Every evening | ORAL | 0 refills | Status: DC | PRN
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
## 2016-02-06 ENCOUNTER — Ambulatory Visit (INDEPENDENT_AMBULATORY_CARE_PROVIDER_SITE_OTHER): Payer: Self-pay | Admitting: Orthopaedic Surgery

## 2016-02-13 ENCOUNTER — Other Ambulatory Visit (INDEPENDENT_AMBULATORY_CARE_PROVIDER_SITE_OTHER): Payer: Self-pay | Admitting: Orthopaedic Surgery

## 2016-02-13 ENCOUNTER — Ambulatory Visit (INDEPENDENT_AMBULATORY_CARE_PROVIDER_SITE_OTHER): Payer: Self-pay | Admitting: Orthopaedic Surgery

## 2016-02-15 ENCOUNTER — Other Ambulatory Visit: Payer: Medicaid Other

## 2016-02-21 ENCOUNTER — Ambulatory Visit (INDEPENDENT_AMBULATORY_CARE_PROVIDER_SITE_OTHER): Payer: Worker's Compensation | Admitting: Orthopaedic Surgery

## 2016-02-27 ENCOUNTER — Ambulatory Visit (INDEPENDENT_AMBULATORY_CARE_PROVIDER_SITE_OTHER): Payer: Self-pay | Admitting: Orthopaedic Surgery

## 2016-03-01 ENCOUNTER — Encounter (INDEPENDENT_AMBULATORY_CARE_PROVIDER_SITE_OTHER): Payer: Self-pay | Admitting: Orthopaedic Surgery

## 2016-03-01 ENCOUNTER — Ambulatory Visit (INDEPENDENT_AMBULATORY_CARE_PROVIDER_SITE_OTHER): Payer: Worker's Compensation | Admitting: Orthopaedic Surgery

## 2016-03-01 DIAGNOSIS — S46211D Strain of muscle, fascia and tendon of other parts of biceps, right arm, subsequent encounter: Secondary | ICD-10-CM | POA: Diagnosis not present

## 2016-03-01 DIAGNOSIS — S46211A Strain of muscle, fascia and tendon of other parts of biceps, right arm, initial encounter: Secondary | ICD-10-CM | POA: Insufficient documentation

## 2016-03-01 NOTE — Progress Notes (Signed)
Office Visit Note   Patient: [name]           Date of Birth: Nov 25, 1960           MRN: [id number] Visit Date: 03/01/2016              Requested by: No referring provider defined for this encounter. PCP: No PCP Per Patient   Assessment & Plan: Visit Diagnoses: No diagnosis found.  Plan: FCE was reviewed and does show a permanent restriction of light to medium duty with occasional lifting of 40 pounds and frequent lifting of 20 pounds. At this point he has reached MMI I would release him with a permanent partial impairment of 20% of the right upper extremity. I will see him back for his MRI of his knee when this has been completed.  Follow-Up Instructions: No Follow-up on file.   Orders:  No orders of the defined types were placed in this encounter.  No orders of the defined types were placed in this encounter.     Procedures: No procedures performed   Clinical Data: No additional findings.   Subjective: Chief Complaint  Patient presents with  . Right Arm - Pain  . Right Knee - Pain    Patient follows up today for review of his FCR and final rate and release for his right distal biceps rupture. He reports some tingling and numbness in the lateral antecubital brachial nerve distribution but no motor or sensory deficits.    Review of Systems  Constitutional: Negative.   HENT: Negative.   Eyes: Negative.   Respiratory: Negative.   Cardiovascular: Negative.   Gastrointestinal: Negative.   Endocrine: Negative.   Genitourinary: Negative.   Musculoskeletal: Negative.   Skin: Negative.   Allergic/Immunologic: Negative.   Neurological: Negative.   Hematological: Negative.   Psychiatric/Behavioral: Negative.      Objective: Vital Signs: There were no vitals taken for this visit.  Physical Exam  Constitutional: He is oriented to person, place, and time. He appears well-developed and well-nourished.  HENT:  Head: Normocephalic and atraumatic.    Eyes: EOM are normal.  Neck: Neck supple.  Cardiovascular: Intact distal pulses.   Pulmonary/Chest: Effort normal.  Abdominal: Soft.  Neurological: He is alert and oriented to person, place, and time.  Skin: Skin is warm.  Psychiatric: He has a normal mood and affect. His behavior is normal. Judgment and thought content normal.  Nursing note and vitals reviewed.   Ortho Exam Exam of the right forearm and elbow are benign. The surgical scar is well-healed. He has good appropriate strength. Specialty Comments:  No specialty comments available.  Imaging: No results found.   PMFS History: Patient Active Problem List   Diagnosis Date Noted  . Pain in right lower leg 01/30/2016  . Chest pain 04/26/2015  . Hypokalemia 04/26/2015  . COPD exacerbation ([place]) 04/26/2015  . Pain in the chest   . Fall 07/22/2014  . Multiple fractures of ribs of right side 07/22/2014  . Pneumothorax on right 07/22/2014  . Traumatic pneumohemothorax 07/21/2014  . Altered mental state 04/15/2013  . Acute encephalopathy 04/15/2013  . Difficulty speaking 04/15/2013  . Headache(784.0) 04/15/2013  . Tobacco abuse 04/15/2013  . HTN (hypertension) 04/15/2013   Past Medical History:  Diagnosis Date  . Hypertension     Family History  Problem Relation Age of Onset  . CAD Father   . Bone cancer Mother     Past Surgical History:  Procedure Laterality Date  .  CARDIAC CATHETERIZATION    . HIP SURGERY    . KNEE SURGERY     Social History   Occupational History  . Not on file.   Social History Main Topics  . Smoking status: Current Every Day Smoker    Packs/day: 0.50  . Smokeless tobacco: Never Used  . Alcohol use Yes     Comment: on occasion  . Drug use: No  . Sexual activity: Not on file

## 2016-03-26 HISTORY — PX: BICEPS TENDON REPAIR: SHX566

## 2016-04-02 ENCOUNTER — Telehealth (INDEPENDENT_AMBULATORY_CARE_PROVIDER_SITE_OTHER): Payer: Self-pay

## 2016-04-02 NOTE — Telephone Encounter (Signed)
Received voicemail from [place] who is the work comp case mgr requesting the last office note be faxed to her. Faxed 03/01/16 note to [phone].

## 2016-04-09 IMAGING — DX DG ELBOW COMPLETE 3+V*R*
4 series · 4 of 4 positions shown · non-contrast
Comparison: None.

CLINICAL DATA: Right elbow pain for 1 week upper pulling something

EXAM:
RIGHT ELBOW - COMPLETE 3+ VIEW

[x elbow ap right]
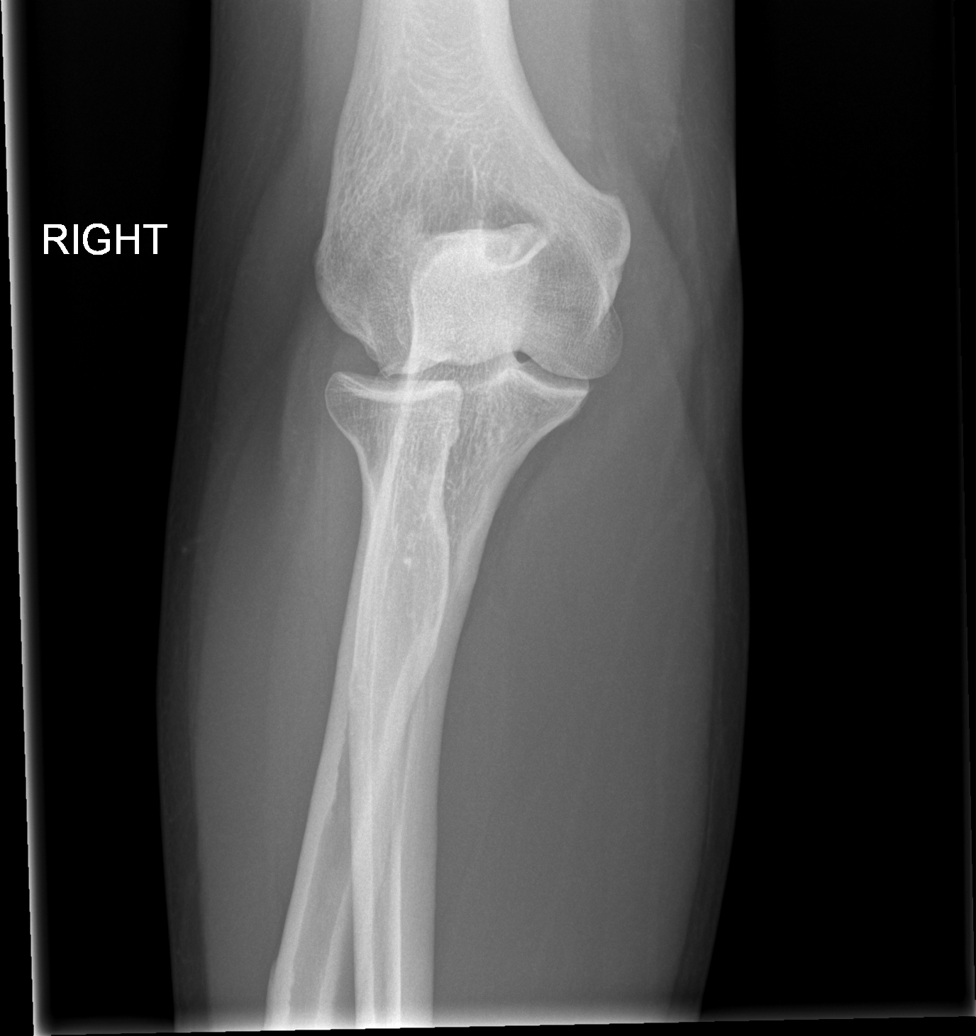

[x elbow obl right (1 of 2)]
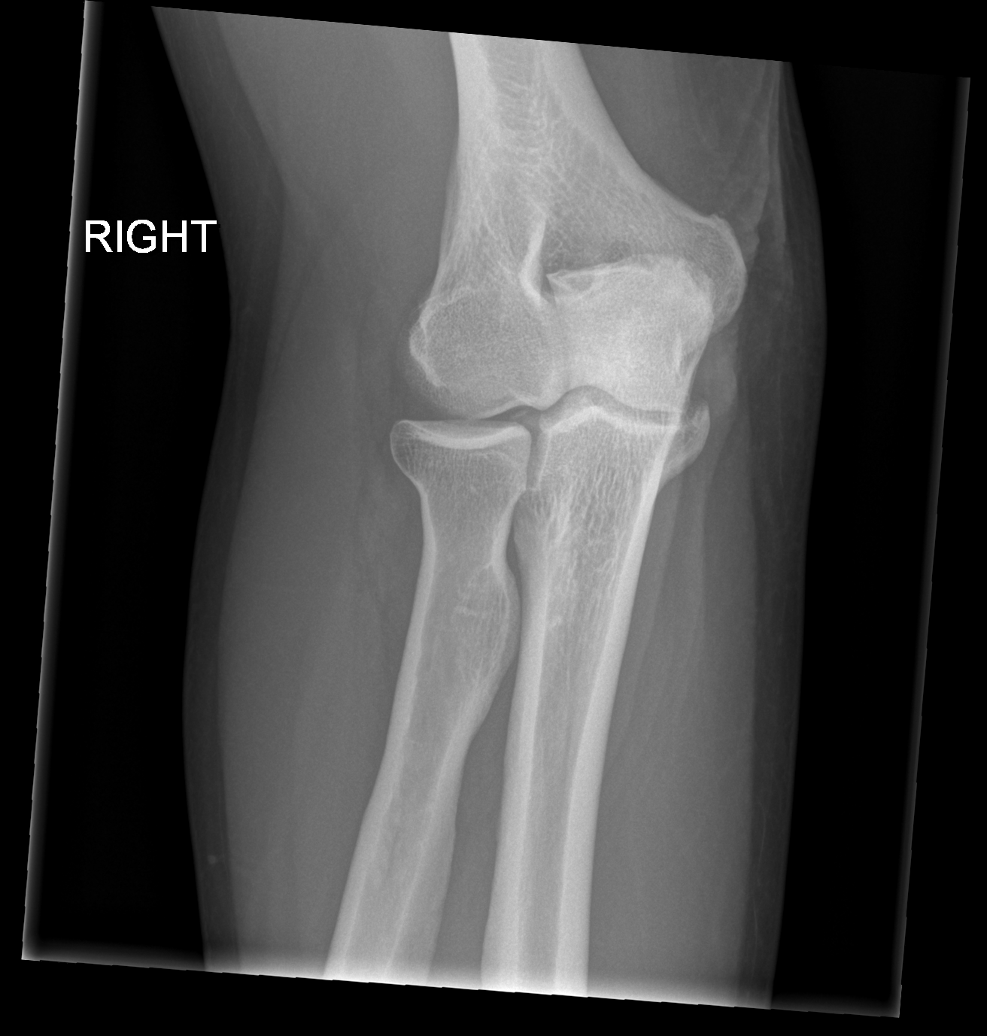

[x elbow obl right (2 of 2)]
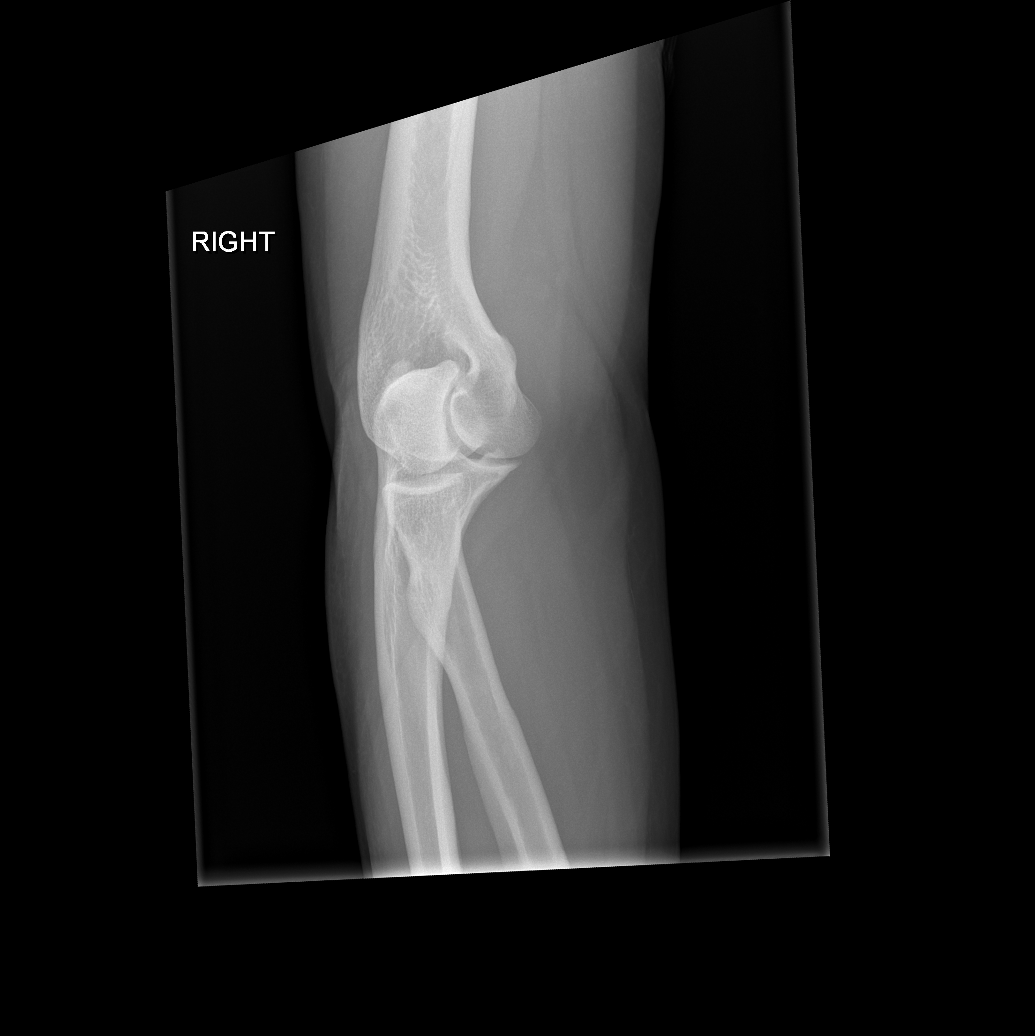

[x elbow lat right]
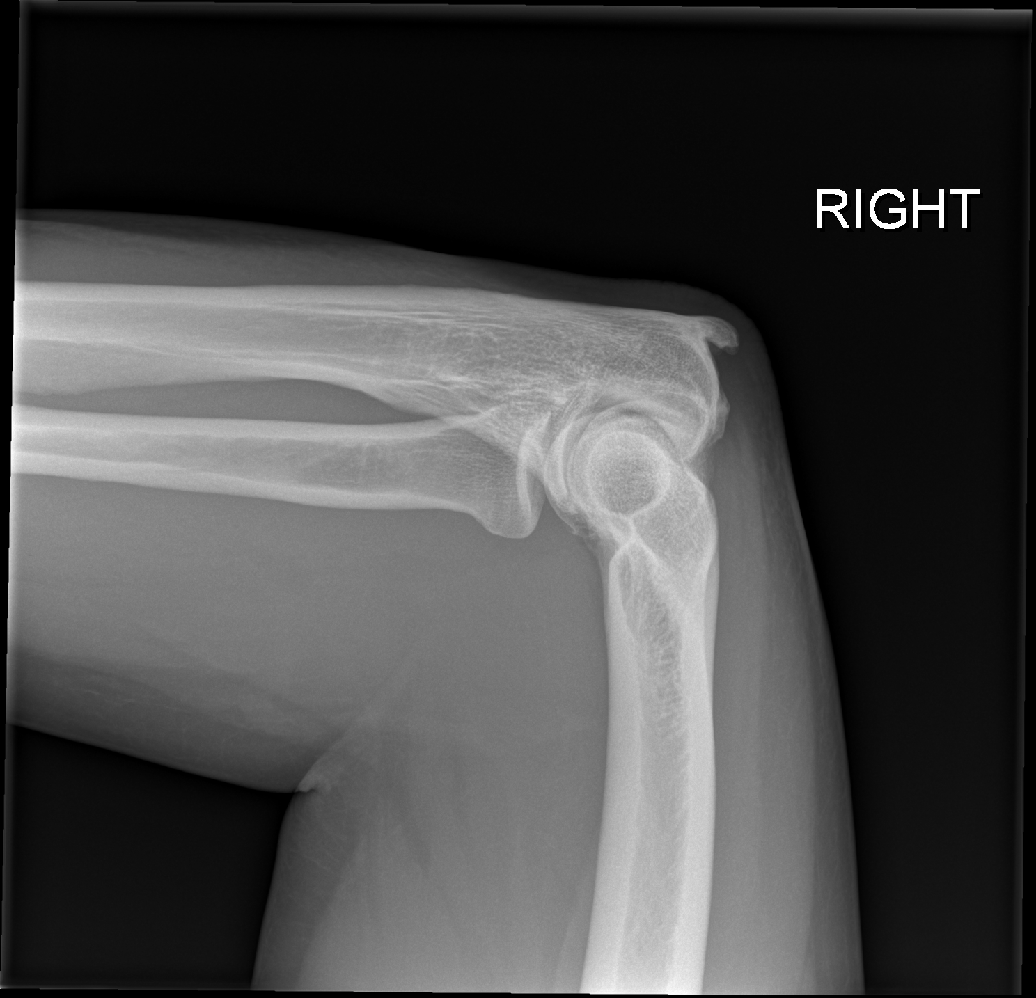

[4 of 4 positions shown; findings below may reference images not displayed]

FINDINGS: Four views of the right elbow submitted. No acute fracture or
subluxation. Spurring of olecranon is noted. No posterior fat pad
sign.
IMPRESSION: No acute fracture or subluxation.  Spurring of olecranon is noted.

## 2016-06-14 ENCOUNTER — Emergency Department (HOSPITAL_COMMUNITY): Payer: Medicaid Other

## 2016-06-14 ENCOUNTER — Emergency Department (HOSPITAL_COMMUNITY)
Admission: EM | Admit: 2016-06-14 | Discharge: 2016-06-14 | Disposition: A | Discharge status: Home / Self Care | Payer: Medicaid Other | Attending: Emergency Medicine | Admitting: Emergency Medicine

## 2016-06-14 ENCOUNTER — Encounter (HOSPITAL_COMMUNITY): Payer: Self-pay | Admitting: Emergency Medicine

## 2016-06-14 DIAGNOSIS — I10 Essential (primary) hypertension: Secondary | ICD-10-CM | POA: Insufficient documentation

## 2016-06-14 DIAGNOSIS — J441 Chronic obstructive pulmonary disease with (acute) exacerbation: Secondary | ICD-10-CM | POA: Diagnosis not present

## 2016-06-14 DIAGNOSIS — F17200 Nicotine dependence, unspecified, uncomplicated: Secondary | ICD-10-CM | POA: Insufficient documentation

## 2016-06-14 DIAGNOSIS — R079 Chest pain, unspecified: Secondary | ICD-10-CM | POA: Diagnosis present

## 2016-06-14 LAB — CBC
HCT: 44.7 % (ref 39.0–52.0)
Hemoglobin: 14.9 g/dL (ref 13.0–17.0)
MCH: 30 pg (ref 26.0–34.0)
MCHC: 33.3 g/dL (ref 30.0–36.0)
MCV: 90.1 fL (ref 78.0–100.0)
PLATELETS: 209 10*3/uL (ref 150–400)
RBC: 4.96 MIL/uL (ref 4.22–5.81)
RDW: 14.5 % (ref 11.5–15.5)
WBC: 5.5 10*3/uL (ref 4.0–10.5)

## 2016-06-14 LAB — BASIC METABOLIC PANEL
Anion gap: 12 (ref 5–15)
BUN: 7 mg/dL (ref 6–20)
CALCIUM: 8.7 mg/dL — AB (ref 8.9–10.3)
CHLORIDE: 107 mmol/L (ref 101–111)
CO2: 21 mmol/L — ABNORMAL LOW (ref 22–32)
CREATININE: 0.98 mg/dL (ref 0.61–1.24)
GFR calc non Af Amer: >60 mL/min (ref >60)
Glucose, Bld: 127 mg/dL — ABNORMAL HIGH (ref 65–99)
Potassium: 3.4 mmol/L — ABNORMAL LOW (ref 3.5–5.1)
SODIUM: 140 mmol/L (ref 135–145)

## 2016-06-14 LAB — I-STAT TROPONIN, ED
TROPONIN I, POC: 0 ng/mL (ref 0.00–0.08)
TROPONIN I, POC: 0 ng/mL (ref 0.00–0.08)

## 2016-06-14 MED ORDER — DIPHENHYDRAMINE HCL 50 MG/ML IJ SOLN
25.0000 mg | Freq: Once | INTRAMUSCULAR | Status: AC
  Administered 2016-06-14: 25 mg via INTRAVENOUS
  Filled 2016-06-14: qty 1

## 2016-06-14 MED ORDER — METHYLPREDNISOLONE SODIUM SUCC 125 MG IJ SOLR
125.0000 mg | Freq: Once | INTRAMUSCULAR | Status: AC
  Administered 2016-06-14: 125 mg via INTRAVENOUS
  Filled 2016-06-14: qty 2

## 2016-06-14 MED ORDER — IPRATROPIUM-ALBUTEROL 0.5-2.5 (3) MG/3ML IN SOLN
3.0000 mL | Freq: Once | RESPIRATORY_TRACT | Status: AC
Start: 2016-06-14 — End: 2016-06-14
  Administered 2016-06-14: 3 mL via RESPIRATORY_TRACT
  Filled 2016-06-14: qty 3

## 2016-06-14 MED ORDER — PREDNISONE 20 MG PO TABS: 60.0000 mg | ORAL_TABLET | Freq: Every day | ORAL | 0 refills | Status: AC

## 2016-06-14 MED ORDER — HYDROMORPHONE HCL 1 MG/ML IJ SOLN: 1.0000 mg | Freq: Once | INTRAMUSCULAR | Status: DC

## 2016-06-14 MED ORDER — ALBUTEROL (5 MG/ML) CONTINUOUS INHALATION SOLN
10.0000 mg/h | INHALATION_SOLUTION | Freq: Once | RESPIRATORY_TRACT | Status: AC
  Administered 2016-06-14: 10 mg/h via RESPIRATORY_TRACT
  Filled 2016-06-14: qty 20

## 2016-06-14 MED ORDER — LEVOFLOXACIN 750 MG PO TABS: 750.0000 mg | ORAL_TABLET | Freq: Every day | ORAL | 0 refills | Status: AC

## 2016-06-14 MED ORDER — METOCLOPRAMIDE HCL 5 MG/ML IJ SOLN
10.0000 mg | Freq: Once | INTRAMUSCULAR | Status: AC
  Administered 2016-06-14: 10 mg via INTRAVENOUS
  Filled 2016-06-14: qty 2

## 2016-06-14 MED ORDER — MORPHINE SULFATE (PF) 4 MG/ML IV SOLN
4.0000 mg | Freq: Once | INTRAVENOUS | Status: AC
  Administered 2016-06-14: 4 mg via INTRAVENOUS
  Filled 2016-06-14: qty 1

## 2016-06-14 MED ORDER — KETOROLAC TROMETHAMINE 30 MG/ML IJ SOLN
30.0000 mg | Freq: Once | INTRAMUSCULAR | Status: AC
  Administered 2016-06-14: 30 mg via INTRAVENOUS
  Filled 2016-06-14: qty 1

## 2016-06-14 MED ORDER — ALBUTEROL SULFATE HFA 108 (90 BASE) MCG/ACT IN AERS: 1.0000 | INHALATION_SPRAY | Freq: Four times a day (QID) | RESPIRATORY_TRACT | 0 refills | Status: DC | PRN

## 2016-06-14 NOTE — ED Notes (Signed)
Pt states he understands instructions. Home stable with steady gait. All questions answered.

## 2016-06-14 NOTE — ED Notes (Signed)
Ambulated patient. Stayed between 94-97% the whole time. Not struggling for air or having trouble walking. Alerted doctor to this.

## 2016-06-14 NOTE — Discharge Instructions (Signed)
Please read and follow all provided instructions.  Your diagnoses today include:  1. COPD exacerbation ([place])    Tests performed today include: Chest x-ray - did not show pneumonia Vital signs. See below for your results today.   Medications prescribed:   Take any prescribed medications only as directed.  Home care instructions:  Follow any educational materials contained in this packet.  Follow-up instructions: Please follow-up with your primary care provider in the next 3 days for further evaluation of your symptoms and management of your COPD.   Return instructions:  Please return to the Emergency Department if you experience worsening symptoms. Please return with worsening wheezing, shortness of breath, or difficulty breathing. Return with persistent fever above 101F.  Please return if you have any other emergent concerns.  Additional Information:  Your vital signs today were: BP (!) 149/79    Pulse 64    Temp 98.2 F (36.8 C) (Oral)    Resp 14    Ht 6\' 2"  (1.88 m)    Wt 113.4 kg    SpO2 93%    BMI 32.10 kg/m  If your blood pressure (BP) was elevated above 135/85 this visit, please have this repeated by your doctor within one month. --------------

## 2016-06-14 NOTE — ED Provider Notes (Signed)
[facility] DEPT Provider Note   CSN: [id number] Arrival date & time: 06/14/16  0534     History   Chief Complaint Chief Complaint  Patient presents with  . Chest Pain    HPI [name] is a 56 y.o. male.  HPI  56 y.o. male with a hx of HTN, COPD, presents to the Emergency Department today complaining of left sided chest pain with onset this AM around 0400. Notes hx of same in past. Pt noteiced the pain while ambulating from bed to bathroom. Noted sharp sensation in chest. Dissipated after a few seconds. Pain continued while at rest and occurs every 6-7 minutes and lasts only a few seconds. Notes intermittent dull/sharp pain. Notes shortness of breath as well as nausea. No emesis. No diaphoresis. No fevers. Notes URI symptoms with productive cough x "a while," as well as sinus congestion and rhinorrhea. No hx ACS. No FH. Notes Risk Factors of HTN, Smoking. 1 PPD since 56 years old. No hx DVT/PE. No recent travel. No recent surgeries. No other symptoms noted. He states that he had a cardiac catheterization"many years ago" in [place]. No stens were placed. By record, pt had a cardiac catheterization in 2009 with no evidence of CAD with an EF of 35-40% and an echocardiogram in January 2015 that showed an EF at that time of 55-60%. Pt was treated as COPD exacerbation.   Echocardiogram 04-27-15 - Left ventricle: The cavity size was normal. Wall thickness was   normal. Systolic function was normal. The estimated ejection   fraction was in the range of 55% to 60%. Wall motion was normal;   there were no regional wall motion abnormalities. Left   ventricular diastolic function parameters were normal. - Right atrium: The atrium was mildly dilated.  Past Medical History:  Diagnosis Date  . Hypertension     Patient Active Problem List   Diagnosis Date Noted  . Rupture of right distal biceps tendon 03/01/2016  . Pain in right lower leg 01/30/2016  . Chest pain 04/26/2015  .  Hypokalemia 04/26/2015  . COPD exacerbation ([place]) 04/26/2015  . Pain in the chest   . Fall 07/22/2014  . Multiple fractures of ribs of right side 07/22/2014  . Pneumothorax on right 07/22/2014  . Traumatic pneumohemothorax 07/21/2014  . Altered mental state 04/15/2013  . Acute encephalopathy 04/15/2013  . Difficulty speaking 04/15/2013  . Headache(784.0) 04/15/2013  . Tobacco abuse 04/15/2013  . HTN (hypertension) 04/15/2013    Past Surgical History:  Procedure Laterality Date  . CARDIAC CATHETERIZATION    . HIP SURGERY    . KNEE SURGERY         Home Medications    Prior to Admission medications   Medication Sig Start Date End Date Taking? Authorizing Provider  diazepam (VALIUM) 10 MG tablet Take 1 tablet (10 mg total) by mouth at bedtime as needed for sleep. 01/30/16   Leandrew Koyanagi, MD  ibuprofen (ADVIL,MOTRIN) 800 MG tablet Take 1 tablet (800 mg total) by mouth 3 (three) times daily. Patient taking differently: Take 800 mg by mouth every 8 (eight) hours as needed for moderate pain.  06/13/15   Shawn C Joy, PA-C  naproxen (NAPROSYN) 500 MG tablet Take 1 tablet (500 mg total) by mouth 2 (two) times daily with a meal. Patient not taking: Reported on 03/01/2016 01/30/16   Leandrew Koyanagi, MD  oxyCODONE-acetaminophen (PERCOCET) 7.5-325 MG tablet Take 1 tablet by mouth every 4 (four) hours as needed for severe pain.  Historical Provider, MD  oxyCODONE-acetaminophen (PERCOCET/ROXICET) 5-325 MG tablet Take 1-2 tablets by mouth every 4 (four) hours as needed for severe pain. Patient not taking: Reported on 03/01/2016 08/05/15   Blanchie Dessert, MD  tetrahydrozoline 0.05 % ophthalmic solution Place 1 drop into both eyes 3 (three) times daily as needed (dry eyes).    Historical Provider, MD    Family History Family History  Problem Relation Age of Onset  . CAD Father   . Bone cancer Mother     Social History Social History  Substance Use Topics  . Smoking status: Current Every Day  Smoker    Packs/day: 0.50  . Smokeless tobacco: Never Used  . Alcohol use Yes     Comment: on occasion     Allergies   Patient has no known allergies.   Review of Systems Review of Systems ROS reviewed and all are negative for acute change except as noted in the HPI.  Physical Exam Updated Vital Signs BP (!) 145/93   Pulse 71   Temp 98.2 F (36.8 C) (Oral)   Resp 20   Ht 6\' 2"  (1.88 m)   Wt 113.4 kg   SpO2 99%   BMI 32.10 kg/m   Physical Exam  Constitutional: He is oriented to person, place, and time. Vital signs are normal. He appears well-developed and well-nourished.  HENT:  Head: Normocephalic and atraumatic.  Right Ear: Hearing normal.  Left Ear: Hearing normal.  Eyes: Conjunctivae and EOM are normal. Pupils are equal, round, and reactive to light.  Neck: Normal range of motion.  Cardiovascular: Normal rate, regular rhythm, normal heart sounds and intact distal pulses.   Pulmonary/Chest: Accessory muscle usage present. He has wheezes in the right upper field, the right lower field, the left upper field and the left lower field.  Abdominal: Soft.  Musculoskeletal: Normal range of motion.  Neurological: He is alert and oriented to person, place, and time.  Skin: Skin is warm and dry.  Psychiatric: He has a normal mood and affect. His speech is normal and behavior is normal. Thought content normal.  Nursing note and vitals reviewed.  ED Treatments / Results  Labs (all labs ordered are listed, but only abnormal results are displayed) Labs Reviewed  BASIC METABOLIC PANEL - Abnormal; Notable for the following:       Result Value   Potassium 3.4 (*)    CO2 21 (*)    Glucose, Bld 127 (*)    Calcium 8.7 (*)    All other components within normal limits  CBC  I-STAT TROPOININ, ED  I-STAT TROPOININ, ED    EKG  EKG Interpretation  Date/Time:  Thursday June 14 2016 05:40:53 EDT Ventricular Rate:  73 PR Interval:    QRS Duration: 85 QT Interval:  417 QTC  Calculation: 460 R Axis:   41 Text Interpretation:  Sinus rhythm No significant change since last tracing Confirmed by Dina Rich  MD, Loma Sousa ([id number]) on 06/14/2016 6:10:41 AM Also confirmed by Dina Rich  MD, COURTNEY ([id number]), editor [place]  CCT, BEVERLY ([id number])  on 06/14/2016 6:34:59 AM       Radiology Dg Chest 2 View  Result Date: 06/14/2016 CLINICAL DATA:  56 year old male with left-sided chest pain. EXAM: CHEST  2 VIEW COMPARISON:  Chest radiograph dated 04/25/2015 FINDINGS: The lungs are clear. There is no pleural effusion or pneumothorax. Stable right mid lung field linear atelectasis/scarring. The cardiac silhouette is within normal limits. No acute osseous pathology. IMPRESSION: No active cardiopulmonary disease. Electronically Signed  By: Anner Crete M.D.   On: 06/14/2016 06:25    Procedures Procedures (including critical care time)  Medications Ordered in ED Medications  ipratropium-albuterol (DUONEB) 0.5-2.5 (3) MG/3ML nebulizer solution 3 mL (3 mLs Nebulization Given 06/14/16 0657)  methylPREDNISolone sodium succinate (SOLU-MEDROL) 125 mg/2 mL injection 125 mg (125 mg Intravenous Given 06/14/16 0709)  morphine 4 MG/ML injection 4 mg (4 mg Intravenous Given 06/14/16 0701)  albuterol (PROVENTIL,VENTOLIN) solution continuous neb (10 mg/hr Nebulization Given 06/14/16 0753)  diphenhydrAMINE (BENADRYL) injection 25 mg (25 mg Intravenous Given 06/14/16 0959)  metoCLOPramide (REGLAN) injection 10 mg (10 mg Intravenous Given 06/14/16 1001)  ketorolac (TORADOL) 30 MG/ML injection 30 mg (30 mg Intravenous Given 06/14/16 1004)   Initial Impression / Assessment and Plan / ED Course  I have reviewed the triage vital signs and the nursing notes.  Pertinent labs & imaging results that were available during my care of the patient were reviewed by me and considered in my medical decision making (see chart for details).  Final Clinical Impressions(s) / ED Diagnoses  {I have reviewed and evaluated  the relevant laboratory values. {I have reviewed and evaluated the relevant imaging studies. {I have interpreted the relevant EKG. {I have reviewed the relevant previous healthcare records.  {I obtained HPI from historian.   ED Course:  Assessment: Pt is a 56 y.o. male with hx HTN, COPD who presents with chest pain with onset this AM around 0400. Notes nausea. No emesis. No diaphoresis. Hx same last year. Admitted for CP rule out and thought 2/2 COPD exacerbation. Cath in 2009 unremarkable. No hx ACS. No FH. Risk Factors: HTN, Smoking. Heart Score 3. On exam, pt in NAD. Nontoxic/nonseptic appearing. VSS. Afebrile. Lungs with diffuse wheeze. Accessory muscle use noted. Heart RRR. Abdomen nontender soft. CBC unremarkable. BMP unremarkable. Trop negative x2. EKG unremarkable. CXR unremarkable. Given duoneb, solumedrol and hour long neb treatment in ED. Ambulated in ED with O2 saturations 95-96% on RA. Wheeze improved. Pt feeling better. Heart Score 3. Plan is to DC home with close follow up to PCP. Pt agreeable to plan. Strict return precautions given. Given Rx prednisone burst as well as Levaquin. At time of discharge, Patient is in no acute distress. Vital Signs are stable. Patient is able to ambulate. Patient able to tolerate PO.   Disposition/Plan:  DC Home Additional Verbal discharge instructions given and discussed with patient.  Pt Instructed to f/u with PCP in the next week for evaluation and treatment of symptoms. Return precautions given Pt acknowledges and agrees with plan  Supervising Physician Merryl Hacker, MD  Final diagnoses:  COPD exacerbation [facility])    New Prescriptions New Prescriptions   No medications on file     Shary Decamp, PA-C 06/14/16 [place], MD 06/23/16 [id number]

## 2016-06-14 NOTE — ED Triage Notes (Signed)
Pt c/o L sided CP onset 0400, non radiating, intermittent dull/sharp with shob and nausea.  Pt was at rest with onset.

## 2016-06-14 NOTE — ED Notes (Signed)
Family at bedside. 

## 2016-09-17 ENCOUNTER — Encounter (HOSPITAL_COMMUNITY): Payer: Self-pay | Admitting: Emergency Medicine

## 2016-09-17 ENCOUNTER — Emergency Department (HOSPITAL_COMMUNITY)
Admission: EM | Admit: 2016-09-17 | Discharge: 2016-09-17 | Disposition: A | Discharge status: Home / Self Care | Payer: Medicaid Other | Attending: Emergency Medicine | Admitting: Emergency Medicine

## 2016-09-17 DIAGNOSIS — Z79899 Other long term (current) drug therapy: Secondary | ICD-10-CM | POA: Diagnosis not present

## 2016-09-17 DIAGNOSIS — J449 Chronic obstructive pulmonary disease, unspecified: Secondary | ICD-10-CM | POA: Insufficient documentation

## 2016-09-17 DIAGNOSIS — I10 Essential (primary) hypertension: Secondary | ICD-10-CM | POA: Insufficient documentation

## 2016-09-17 DIAGNOSIS — F17200 Nicotine dependence, unspecified, uncomplicated: Secondary | ICD-10-CM | POA: Diagnosis not present

## 2016-09-17 DIAGNOSIS — M79661 Pain in right lower leg: Secondary | ICD-10-CM | POA: Diagnosis present

## 2016-09-17 DIAGNOSIS — L03115 Cellulitis of right lower limb: Secondary | ICD-10-CM | POA: Diagnosis not present

## 2016-09-17 HISTORY — DX: Chronic obstructive pulmonary disease, unspecified: J44.9

## 2016-09-17 LAB — BASIC METABOLIC PANEL
ANION GAP: 8 (ref 5–15)
BUN: 5 mg/dL — ABNORMAL LOW (ref 6–20)
CO2: 25 mmol/L (ref 22–32)
Calcium: 9.3 mg/dL (ref 8.9–10.3)
Chloride: 104 mmol/L (ref 101–111)
Creatinine, Ser: 0.72 mg/dL (ref 0.61–1.24)
GFR calc Af Amer: >60 mL/min (ref >60)
GLUCOSE: 81 mg/dL (ref 65–99)
POTASSIUM: 3.8 mmol/L (ref 3.5–5.1)
Sodium: 137 mmol/L (ref 135–145)

## 2016-09-17 LAB — CBC WITH DIFFERENTIAL/PLATELET
BASOS ABS: 0 10*3/uL (ref 0.0–0.1)
Basophils Relative: 0 %
Eosinophils Absolute: 0.1 10*3/uL (ref 0.0–0.7)
Eosinophils Relative: 1 %
HEMATOCRIT: 46.5 % (ref 39.0–52.0)
Hemoglobin: 15.7 g/dL (ref 13.0–17.0)
LYMPHS PCT: 23 %
Lymphs Abs: 2.1 10*3/uL (ref 0.7–4.0)
MCH: 29.7 pg (ref 26.0–34.0)
MCHC: 33.8 g/dL (ref 30.0–36.0)
MCV: 88.1 fL (ref 78.0–100.0)
Monocytes Absolute: 1.1 10*3/uL — ABNORMAL HIGH (ref 0.1–1.0)
Monocytes Relative: 11 %
NEUTROS ABS: 6 10*3/uL (ref 1.7–7.7)
NEUTROS PCT: 65 %
PLATELETS: 224 10*3/uL (ref 150–400)
RBC: 5.28 MIL/uL (ref 4.22–5.81)
RDW: 13.7 % (ref 11.5–15.5)
WBC: 9.3 10*3/uL (ref 4.0–10.5)

## 2016-09-17 LAB — I-STAT CG4 LACTIC ACID, ED: Lactic Acid, Venous: 1.55 mmol/L (ref 0.5–1.9)

## 2016-09-17 MED ORDER — KETOROLAC TROMETHAMINE 60 MG/2ML IM SOLN
60.0000 mg | Freq: Once | INTRAMUSCULAR | Status: AC
  Administered 2016-09-17: 60 mg via INTRAMUSCULAR
  Filled 2016-09-17: qty 2

## 2016-09-17 MED ORDER — OXYCODONE-ACETAMINOPHEN 5-325 MG PO TABS
1.0000 | ORAL_TABLET | Freq: Once | ORAL | Status: AC
  Administered 2016-09-17: 1 via ORAL
  Filled 2016-09-17: qty 1

## 2016-09-17 MED ORDER — IBUPROFEN 600 MG PO TABS: 600.0000 mg | ORAL_TABLET | Freq: Four times a day (QID) | ORAL | 0 refills | Status: DC | PRN

## 2016-09-17 MED ORDER — CEPHALEXIN 500 MG PO CAPS: 500.0000 mg | ORAL_CAPSULE | Freq: Four times a day (QID) | ORAL | 0 refills | Status: DC

## 2016-09-17 MED ORDER — HYDROCODONE-ACETAMINOPHEN 5-325 MG PO TABS: 1.0000 | ORAL_TABLET | Freq: Four times a day (QID) | ORAL | 0 refills | Status: DC | PRN

## 2016-09-17 MED ORDER — CEPHALEXIN 250 MG PO CAPS
500.0000 mg | ORAL_CAPSULE | Freq: Once | ORAL | Status: AC
  Administered 2016-09-17: 500 mg via ORAL
  Filled 2016-09-17: qty 2

## 2016-09-17 NOTE — ED Provider Notes (Signed)
[facility] DEPT Provider Note   CSN: [id number] Arrival date & time: 09/17/16  1340  By signing my name below, I, Mayer Masker, attest that this documentation has been prepared under the direction and in the presence of Edwing Figley. Electronically Signed: Mayer Masker, Scribe. 09/17/16. 3:28 PM.  History   Chief Complaint Chief Complaint  Patient presents with  . Knee Pain  . Leg Pain   The history is provided by the patient. No language interpreter was used.    HPI Comments: [name] is a 56 y.o. male who presents to the Emergency Department complaining of constant, gradually worsening right-sided leg pain since Saturday. He describes the pain as a throbbing. He has associated swelling to the area and difficulty bearing weight. He denies any mechanism of injury or fall, or possible insect bite. He denies fever. He has no PMHx of DM.  Past Medical History:  Diagnosis Date  . COPD (chronic obstructive pulmonary disease) ([place])   . Hypertension     Patient Active Problem List   Diagnosis Date Noted  . Rupture of right distal biceps tendon 03/01/2016  . Pain in right lower leg 01/30/2016  . Chest pain 04/26/2015  . Hypokalemia 04/26/2015  . COPD exacerbation ([place]) 04/26/2015  . Pain in the chest   . Fall 07/22/2014  . Multiple fractures of ribs of right side 07/22/2014  . Pneumothorax on right 07/22/2014  . Traumatic pneumohemothorax 07/21/2014  . Altered mental state 04/15/2013  . Acute encephalopathy 04/15/2013  . Difficulty speaking 04/15/2013  . Headache(784.0) 04/15/2013  . Tobacco abuse 04/15/2013  . HTN (hypertension) 04/15/2013    Past Surgical History:  Procedure Laterality Date  . CARDIAC CATHETERIZATION    . HIP SURGERY    . KNEE SURGERY         Home Medications    Prior to Admission medications   Medication Sig Start Date End Date Taking? Authorizing Provider  albuterol (PROVENTIL HFA;VENTOLIN HFA) 108 (90 Base) MCG/ACT  inhaler Inhale 1-2 puffs into the lungs every 6 (six) hours as needed for wheezing or shortness of breath. 06/14/16   Shary Decamp, PA-C  diazepam (VALIUM) 10 MG tablet Take 1 tablet (10 mg total) by mouth at bedtime as needed for sleep. 01/30/16   Leandrew Koyanagi, MD  ibuprofen (ADVIL,MOTRIN) 800 MG tablet Take 1 tablet (800 mg total) by mouth 3 (three) times daily. Patient not taking: Reported on 06/14/2016 06/13/15   Arlean Hopping C, PA-C  naproxen (NAPROSYN) 500 MG tablet Take 1 tablet (500 mg total) by mouth 2 (two) times daily with a meal. Patient not taking: Reported on 03/01/2016 01/30/16   Leandrew Koyanagi, MD  oxyCODONE-acetaminophen (PERCOCET) 7.5-325 MG tablet Take 1 tablet by mouth every 4 (four) hours as needed for severe pain.    [provider]  oxyCODONE-acetaminophen (PERCOCET/ROXICET) 5-325 MG tablet Take 1-2 tablets by mouth every 4 (four) hours as needed for severe pain. Patient not taking: Reported on 03/01/2016 08/05/15   Blanchie Dessert, MD    Family History Family History  Problem Relation Age of Onset  . CAD Father   . Bone cancer Mother     Social History Social History  Substance Use Topics  . Smoking status: Current Every Day Smoker    Packs/day: 0.50  . Smokeless tobacco: Never Used  . Alcohol use Yes     Comment: on occasion     Allergies   Patient has no known allergies.   Review of Systems Review  of Systems  Constitutional: Negative for fever.  Musculoskeletal: Positive for joint swelling.  Skin: Positive for color change and wound.     Physical Exam Updated Vital Signs BP 131/69   Pulse 69   Temp 99.2 F (37.3 C) (Oral)   Resp 16   SpO2 97%   Physical Exam  Constitutional: He is oriented to person, place, and time. He appears well-developed and well-nourished.  HENT:  Head: Normocephalic and atraumatic.  Cardiovascular: Normal rate, regular rhythm and normal heart sounds.   Pulmonary/Chest: Effort normal and breath sounds normal. No  respiratory distress. He has no wheezes. He has no rales.  Musculoskeletal:       Legs: There is erythema over the right kneecap. However medial, lateral, posterior joints nontender. Full range of motion, with some pain with range of motion of the knee. Similar area of erythema just distal on the right anterior shin. Dorsal pedal pulses intact. No petechial lesions  Neurological: He is alert and oriented to person, place, and time.  Skin: Skin is warm and dry.  Psychiatric: He has a normal mood and affect.  Nursing note and vitals reviewed.    ED Treatments / Results  DIAGNOSTIC STUDIES: Oxygen Saturation is 97% on RA, normal by my interpretation.    COORDINATION OF CARE: 3:27 PM Discussed treatment plan with pt at bedside and pt agreed to plan.  Labs (all labs ordered are listed, but only abnormal results are displayed) Labs Reviewed - No data to display  EKG  EKG Interpretation None       Radiology No results found.  Procedures Procedures (including critical care time)  Medications Ordered in ED Medications - No data to display   Initial Impression / Assessment and Plan / ED Course  I have reviewed the triage vital signs and the nursing notes.  Pertinent labs & imaging results that were available during my care of the patient were reviewed by me and considered in my medical decision making (see chart for details).     Patient with cellulitis to the right leg. Afebrile. Non toxic appearing.Denies IV drug use. No concern for STI.  Labs obtained and are normal. Normal lactic and WBC. No concern for septic joint.  Discussed with Dr. Oleta Mouse who has seen pt as well. Will treat outpatient. Will start on keflex. norco for pain. Follow up with pcp in 2 days. Return if worsening.   Vitals:   09/17/16 1408 09/17/16 1452  BP: 129/75 131/69  Pulse: 75 69  Resp: 16 16  Temp: 99.2 F (37.3 C)   TempSrc: Oral   SpO2: 94% 97%     Final Clinical Impressions(s) / ED Diagnoses     Final diagnoses:  Cellulitis of right lower extremity    New Prescriptions New Prescriptions   CEPHALEXIN (KEFLEX) 500 MG CAPSULE    Take 1 capsule (500 mg total) by mouth 4 (four) times daily.   HYDROCODONE-ACETAMINOPHEN (NORCO) 5-325 MG TABLET    Take 1 tablet by mouth every 6 (six) hours as needed for moderate pain.   IBUPROFEN (ADVIL,MOTRIN) 600 MG TABLET    Take 1 tablet (600 mg total) by mouth every 6 (six) hours as needed.   I personally performed the services described in this documentation, which was scribed in my presence. The recorded information has been reviewed and is accurate.     Jeannett Senior, PA-C 09/17/16 1711    Forde Dandy, MD 09/17/16 2204

## 2016-09-17 NOTE — ED Triage Notes (Addendum)
Rt leg and knee pain just woke up with it like that, has a red area on rt front of leg , hurts to bear weight and walk, denies any fall or injury to aREA

## 2016-09-17 NOTE — Discharge Instructions (Signed)
Elevate your leg. Ibuprofen for pain. Norco for severe pain. Take Keflex as prescribed until all gone. Follow-up with family doctor for recheck in 2 days. If worsening return to emergency department.

## 2016-10-01 ENCOUNTER — Encounter (HOSPITAL_COMMUNITY): Payer: Self-pay | Admitting: Emergency Medicine

## 2016-10-01 ENCOUNTER — Emergency Department (HOSPITAL_COMMUNITY)
Admission: EM | Admit: 2016-10-01 | Discharge: 2016-10-01 | Disposition: A | Discharge status: Home / Self Care | Payer: Medicaid Other | Attending: Emergency Medicine | Admitting: Emergency Medicine

## 2016-10-01 DIAGNOSIS — L03115 Cellulitis of right lower limb: Secondary | ICD-10-CM | POA: Diagnosis not present

## 2016-10-01 DIAGNOSIS — Z79899 Other long term (current) drug therapy: Secondary | ICD-10-CM | POA: Diagnosis not present

## 2016-10-01 DIAGNOSIS — F17200 Nicotine dependence, unspecified, uncomplicated: Secondary | ICD-10-CM | POA: Insufficient documentation

## 2016-10-01 DIAGNOSIS — I10 Essential (primary) hypertension: Secondary | ICD-10-CM | POA: Diagnosis not present

## 2016-10-01 DIAGNOSIS — J449 Chronic obstructive pulmonary disease, unspecified: Secondary | ICD-10-CM | POA: Diagnosis not present

## 2016-10-01 DIAGNOSIS — M79661 Pain in right lower leg: Secondary | ICD-10-CM | POA: Diagnosis present

## 2016-10-01 LAB — CBC WITH DIFFERENTIAL/PLATELET
Basophils Absolute: 0 10*3/uL (ref 0.0–0.1)
Basophils Relative: 0 %
EOS ABS: 0.2 10*3/uL (ref 0.0–0.7)
EOS PCT: 2 %
HCT: 45.2 % (ref 39.0–52.0)
Hemoglobin: 15 g/dL (ref 13.0–17.0)
LYMPHS ABS: 2.1 10*3/uL (ref 0.7–4.0)
LYMPHS PCT: 26 %
MCH: 29.5 pg (ref 26.0–34.0)
MCHC: 33.2 g/dL (ref 30.0–36.0)
MCV: 89 fL (ref 78.0–100.0)
MONO ABS: 0.6 10*3/uL (ref 0.1–1.0)
MONOS PCT: 8 %
Neutro Abs: 5.1 10*3/uL (ref 1.7–7.7)
Neutrophils Relative %: 64 %
PLATELETS: 257 10*3/uL (ref 150–400)
RBC: 5.08 MIL/uL (ref 4.22–5.81)
RDW: 13.3 % (ref 11.5–15.5)
WBC: 8.1 10*3/uL (ref 4.0–10.5)

## 2016-10-01 LAB — COMPREHENSIVE METABOLIC PANEL
ALT: 13 U/L — AB (ref 17–63)
ANION GAP: 7 (ref 5–15)
AST: 16 U/L (ref 15–41)
Albumin: 4.1 g/dL (ref 3.5–5.0)
Alkaline Phosphatase: 65 U/L (ref 38–126)
BUN: 5 mg/dL — ABNORMAL LOW (ref 6–20)
CO2: 25 mmol/L (ref 22–32)
CREATININE: 0.92 mg/dL (ref 0.61–1.24)
Calcium: 9.4 mg/dL (ref 8.9–10.3)
Chloride: 105 mmol/L (ref 101–111)
Glucose, Bld: 85 mg/dL (ref 65–99)
Potassium: 4 mmol/L (ref 3.5–5.1)
SODIUM: 137 mmol/L (ref 135–145)
Total Bilirubin: 1 mg/dL (ref 0.3–1.2)
Total Protein: 7 g/dL (ref 6.5–8.1)

## 2016-10-01 MED ORDER — HYDROCODONE-ACETAMINOPHEN 5-325 MG PO TABS: 2.0000 | ORAL_TABLET | Freq: Four times a day (QID) | ORAL | 0 refills | Status: DC | PRN

## 2016-10-01 MED ORDER — SULFAMETHOXAZOLE-TRIMETHOPRIM 800-160 MG PO TABS: 1.0000 | ORAL_TABLET | Freq: Two times a day (BID) | ORAL | 0 refills | Status: AC

## 2016-10-01 MED ORDER — OXYCODONE-ACETAMINOPHEN 5-325 MG PO TABS
ORAL_TABLET | ORAL | Status: AC
  Administered 2016-10-01: 1
  Filled 2016-10-01: qty 1

## 2016-10-01 MED ORDER — SULFAMETHOXAZOLE-TRIMETHOPRIM 800-160 MG PO TABS
1.0000 | ORAL_TABLET | Freq: Once | ORAL | Status: AC
  Administered 2016-10-01: 1 via ORAL
  Filled 2016-10-01: qty 1

## 2016-10-01 MED ORDER — OXYCODONE-ACETAMINOPHEN 5-325 MG PO TABS
1.0000 | ORAL_TABLET | Freq: Once | ORAL | Status: AC
  Administered 2016-10-01: 1 via ORAL
  Filled 2016-10-01: qty 1

## 2016-10-01 MED ORDER — OXYCODONE-ACETAMINOPHEN 5-325 MG PO TABS: 1.0000 | ORAL_TABLET | ORAL | Status: DC | PRN

## 2016-10-01 NOTE — ED Triage Notes (Signed)
Here for same rt leg pain as before, now leg is  More red and swollen than last week and very tender

## 2016-10-01 NOTE — ED Provider Notes (Signed)
[facility] DEPT Provider Note   CSN: [id number] Arrival date & time: 10/01/16  1440     History   Chief Complaint Chief Complaint  Patient presents with  . Leg Pain    HPI [name] is a 56 y.o. male.  The history is provided by the patient.  Illness  This is a recurrent problem. The current episode started 2 days ago. The problem occurs constantly. The problem has been gradually worsening. Pertinent negatives include no chest pain, no abdominal pain, no headaches and no shortness of breath. Associated symptoms comments: Painful right lower extremity with erythema and mild swelling.. Nothing aggravates the symptoms. Nothing relieves the symptoms. He has tried nothing for the symptoms.    Past Medical History:  Diagnosis Date  . COPD (chronic obstructive pulmonary disease) ([place])   . Hypertension     Patient Active Problem List   Diagnosis Date Noted  . Rupture of right distal biceps tendon 03/01/2016  . Pain in right lower leg 01/30/2016  . Chest pain 04/26/2015  . Hypokalemia 04/26/2015  . COPD exacerbation ([place]) 04/26/2015  . Pain in the chest   . Fall 07/22/2014  . Multiple fractures of ribs of right side 07/22/2014  . Pneumothorax on right 07/22/2014  . Traumatic pneumohemothorax 07/21/2014  . Altered mental state 04/15/2013  . Acute encephalopathy 04/15/2013  . Difficulty speaking 04/15/2013  . Headache(784.0) 04/15/2013  . Tobacco abuse 04/15/2013  . HTN (hypertension) 04/15/2013    Past Surgical History:  Procedure Laterality Date  . CARDIAC CATHETERIZATION    . HIP SURGERY    . KNEE SURGERY         Home Medications    Prior to Admission medications   Medication Sig Start Date End Date Taking? Authorizing Provider  albuterol (PROVENTIL HFA;VENTOLIN HFA) 108 (90 Base) MCG/ACT inhaler Inhale 1-2 puffs into the lungs every 6 (six) hours as needed for wheezing or shortness of breath. 06/14/16  Yes Shary Decamp, PA-C  diazepam (VALIUM) 10  MG tablet Take 1 tablet (10 mg total) by mouth at bedtime as needed for sleep. 01/30/16  Yes Leandrew Koyanagi, MD  ibuprofen (ADVIL,MOTRIN) 800 MG tablet Take 800 mg by mouth every 8 (eight) hours as needed for mild pain.   Yes [provider]  naproxen (NAPROSYN) 500 MG tablet Take 1 tablet (500 mg total) by mouth 2 (two) times daily with a meal. 01/30/16  Yes Leandrew Koyanagi, MD  oxyCODONE-acetaminophen (PERCOCET/ROXICET) 5-325 MG tablet Take 1-2 tablets by mouth every 4 (four) hours as needed for severe pain. 08/05/15  Yes Blanchie Dessert, MD  HYDROcodone-acetaminophen (NORCO) 5-325 MG tablet Take 1 tablet by mouth every 6 (six) hours as needed for moderate pain. Patient not taking: Reported on 10/01/2016 09/17/16   Jeannett Senior, PA-C  HYDROcodone-acetaminophen (NORCO/VICODIN) 5-325 MG tablet Take 2 tablets by mouth every 6 (six) hours as needed for severe pain (nightly). 10/01/16   Valda Lamb, MD  ibuprofen (ADVIL,MOTRIN) 600 MG tablet Take 1 tablet (600 mg total) by mouth every 6 (six) hours as needed. Patient not taking: Reported on 10/01/2016 09/17/16   Jeannett Senior, PA-C  sulfamethoxazole-trimethoprim (BACTRIM DS,SEPTRA DS) 800-160 MG tablet Take 1 tablet by mouth 2 (two) times daily. 10/01/16 10/08/16  Valda Lamb, MD    Family History Family History  Problem Relation Age of Onset  . CAD Father   . Bone cancer Mother     Social History Social History  Substance Use Topics  . Smoking status: Current  Every Day Smoker    Packs/day: 0.50  . Smokeless tobacco: Never Used  . Alcohol use Yes     Comment: on occasion     Allergies   Patient has no known allergies.   Review of Systems Review of Systems  Constitutional: Negative for chills and fever.  HENT: Negative for ear pain and sore throat.   Eyes: Negative for pain and visual disturbance.  Respiratory: Negative for cough and shortness of breath.   Cardiovascular: Negative for chest pain and palpitations.    Gastrointestinal: Negative for abdominal pain and vomiting.  Endocrine: Negative for polyuria.  Genitourinary: Negative for dysuria and hematuria.  Musculoskeletal: Positive for gait problem. Negative for arthralgias and back pain.  Skin: Positive for color change. Negative for pallor and rash.  Allergic/Immunologic: Negative for immunocompromised state.  Neurological: Negative for seizures, syncope and headaches.  Psychiatric/Behavioral: Negative for agitation.  All other systems reviewed and are negative.    Physical Exam Updated Vital Signs BP 124/90 (BP Location: Right Arm)   Pulse 75   Temp 98.4 F (36.9 C) (Oral)   Resp 16   SpO2 100%   Physical Exam  Constitutional: He appears well-developed and well-nourished.  HENT:  Head: Normocephalic and atraumatic.  Eyes: Conjunctivae are normal.  Neck: Neck supple.  Cardiovascular: Normal rate and regular rhythm.   No murmur heard. Pulmonary/Chest: Effort normal and breath sounds normal. No respiratory distress.  Abdominal: Soft. There is no tenderness.  Musculoskeletal: He exhibits no edema.  Neurological: He is alert.  Skin: Skin is warm and dry.  Area of erythema to the distal tibia. Cellulitic. No obvious abscess. No fluctuance.  Psychiatric: He has a normal mood and affect.  Nursing note and vitals reviewed.    ED Treatments / Results  Labs (all labs ordered are listed, but only abnormal results are displayed) Labs Reviewed  COMPREHENSIVE METABOLIC PANEL - Abnormal; Notable for the following:       Result Value   BUN 5 (*)    ALT 13 (*)    All other components within normal limits  CBC WITH DIFFERENTIAL/PLATELET    EKG  EKG Interpretation None       Radiology No results found.  Procedures Procedures (including critical care time)  Medications Ordered in ED Medications  oxyCODONE-acetaminophen (PERCOCET/ROXICET) 5-325 MG per tablet 1 tablet (not administered)  oxyCODONE-acetaminophen  (PERCOCET/ROXICET) 5-325 MG per tablet (1 tablet  Given 10/01/16 1625)  oxyCODONE-acetaminophen (PERCOCET/ROXICET) 5-325 MG per tablet 1 tablet (1 tablet Oral Given 10/01/16 2219)  sulfamethoxazole-trimethoprim (BACTRIM DS,SEPTRA DS) 800-160 MG per tablet 1 tablet (1 tablet Oral Given 10/01/16 2245)     Initial Impression / Assessment and Plan / ED Course  I have reviewed the triage vital signs and the nursing notes.  Pertinent labs & imaging results that were available during my care of the patient were reviewed by me and considered in my medical decision making (see chart for details).     [name] coming in with concern for cellulitis. Patient recently completed outpatient course of Keflex. Stated his initial area of erythema resolved however an area distal to the first area is now red and painful. Patient denies any fever, nausea, vomiting, chest pain, shortness of breath. No chills. Able to ambulate.  On exam there is a area of erythema to the distal shin. Non-circumferential. Neurovascularly intact with normal range of motion of the ankle as well as knee. Doubt any septic joints. Appears to be cellulitis and considering no  systemic symptoms do believe outpatient management is indicated. Patient will be discharged with a course of Bactrim, given 1 dose here in the emergency department. Also given a short course of Norco for pain and instructed to take it at night and instructed to take scheduled Tylenol and ibuprofen during the day. He voiced understanding and agreement to the plan, was given strict return precautions that he understood, and he was discharged in stable condition.  Patient was seen with my attending, Dr. Laverta Baltimore, who voiced agreement and oversaw the evaluation and treatment of this patient.   [employer] seismologist was used in the creation of this note. If there are any errors or inconsistencies needing clarification, please contact me directly.   Final Clinical  Impressions(s) / ED Diagnoses   Final diagnoses:  Cellulitis of right lower extremity    New Prescriptions Discharge Medication List as of 10/01/2016 10:36 PM    START taking these medications   Details  sulfamethoxazole-trimethoprim (BACTRIM DS,SEPTRA DS) 800-160 MG tablet Take 1 tablet by mouth 2 (two) times daily., Starting Mon 10/01/2016, Until Mon 10/08/2016, Print         Valda Lamb, MD 10/01/16 6015    Margette Fast, MD 10/02/16 [phone]

## 2016-10-01 NOTE — ED Notes (Signed)
Pt sent nurse first a text message regarding pain, patient assessed by this rn and instructed that he just received pain medication in triage and that we were working very hard to get him to a room, pt is appreciative update.

## 2016-11-05 ENCOUNTER — Emergency Department (HOSPITAL_COMMUNITY)
Admission: EM | Admit: 2016-11-05 | Discharge: 2016-11-06 | Disposition: A | Discharge status: Home / Self Care | Payer: Medicaid Other | Attending: Emergency Medicine | Admitting: Emergency Medicine

## 2016-11-05 ENCOUNTER — Emergency Department (HOSPITAL_COMMUNITY): Payer: Medicaid Other

## 2016-11-05 DIAGNOSIS — Y999 Unspecified external cause status: Secondary | ICD-10-CM | POA: Insufficient documentation

## 2016-11-05 DIAGNOSIS — Y939 Activity, unspecified: Secondary | ICD-10-CM | POA: Insufficient documentation

## 2016-11-05 DIAGNOSIS — F10129 Alcohol abuse with intoxication, unspecified: Secondary | ICD-10-CM | POA: Insufficient documentation

## 2016-11-05 DIAGNOSIS — R51 Headache: Secondary | ICD-10-CM | POA: Insufficient documentation

## 2016-11-05 DIAGNOSIS — S20309A Unspecified superficial injuries of unspecified front wall of thorax, initial encounter: Secondary | ICD-10-CM | POA: Diagnosis present

## 2016-11-05 DIAGNOSIS — F17200 Nicotine dependence, unspecified, uncomplicated: Secondary | ICD-10-CM | POA: Insufficient documentation

## 2016-11-05 DIAGNOSIS — J441 Chronic obstructive pulmonary disease with (acute) exacerbation: Secondary | ICD-10-CM | POA: Insufficient documentation

## 2016-11-05 DIAGNOSIS — I10 Essential (primary) hypertension: Secondary | ICD-10-CM | POA: Insufficient documentation

## 2016-11-05 DIAGNOSIS — F10920 Alcohol use, unspecified with intoxication, uncomplicated: Secondary | ICD-10-CM

## 2016-11-05 DIAGNOSIS — Y929 Unspecified place or not applicable: Secondary | ICD-10-CM | POA: Insufficient documentation

## 2016-11-05 DIAGNOSIS — S20219A Contusion of unspecified front wall of thorax, initial encounter: Secondary | ICD-10-CM | POA: Insufficient documentation

## 2016-11-05 DIAGNOSIS — W109XXA Fall (on) (from) unspecified stairs and steps, initial encounter: Secondary | ICD-10-CM | POA: Diagnosis not present

## 2016-11-05 LAB — SAMPLE TO BLOOD BANK

## 2016-11-05 MED ORDER — ALBUTEROL (5 MG/ML) CONTINUOUS INHALATION SOLN
10.0000 mg/h | INHALATION_SOLUTION | Freq: Once | RESPIRATORY_TRACT | Status: AC
  Administered 2016-11-05: 10 mg/h via RESPIRATORY_TRACT
  Filled 2016-11-05: qty 20

## 2016-11-05 MED ORDER — FENTANYL CITRATE (PF) 100 MCG/2ML IJ SOLN
50.0000 ug | Freq: Once | INTRAMUSCULAR | Status: AC
  Administered 2016-11-05: 50 ug via INTRAVENOUS

## 2016-11-05 MED ORDER — FENTANYL CITRATE (PF) 100 MCG/2ML IJ SOLN
INTRAMUSCULAR | Status: AC
  Filled 2016-11-05: qty 2

## 2016-11-05 NOTE — ED Triage Notes (Addendum)
Pt to ED via [facility] after reported fall\ing down 3-4 cement stairs.  Pt c/o pain in right lower rib area and left hip pain.  On arrival to ED pt on long spine board with c-collar in place.  Pt admits to 1/2 of 40oz beer

## 2016-11-06 ENCOUNTER — Emergency Department (HOSPITAL_COMMUNITY): Payer: Medicaid Other

## 2016-11-06 LAB — COMPREHENSIVE METABOLIC PANEL
ALBUMIN: 4 g/dL (ref 3.5–5.0)
ALK PHOS: 58 U/L (ref 38–126)
ALT: 14 U/L — AB (ref 17–63)
ANION GAP: 11 (ref 5–15)
AST: 18 U/L (ref 15–41)
BILIRUBIN TOTAL: 1 mg/dL (ref 0.3–1.2)
CALCIUM: 9.3 mg/dL (ref 8.9–10.3)
CO2: 20 mmol/L — AB (ref 22–32)
Chloride: 109 mmol/L (ref 101–111)
Creatinine, Ser: 0.91 mg/dL (ref 0.61–1.24)
GFR calc Af Amer: >60 mL/min (ref >60)
GFR calc non Af Amer: >60 mL/min (ref >60)
GLUCOSE: 90 mg/dL (ref 65–99)
Potassium: 3.1 mmol/L — ABNORMAL LOW (ref 3.5–5.1)
SODIUM: 140 mmol/L (ref 135–145)
Total Protein: 7.1 g/dL (ref 6.5–8.1)

## 2016-11-06 LAB — CBC
HCT: 46.4 % (ref 39.0–52.0)
HEMOGLOBIN: 15.4 g/dL (ref 13.0–17.0)
MCH: 28.7 pg (ref 26.0–34.0)
MCHC: 33.2 g/dL (ref 30.0–36.0)
MCV: 86.4 fL (ref 78.0–100.0)
Platelets: 228 10*3/uL (ref 150–400)
RBC: 5.37 MIL/uL (ref 4.22–5.81)
RDW: 14.4 % (ref 11.5–15.5)
WBC: 7.3 10*3/uL (ref 4.0–10.5)

## 2016-11-06 LAB — URINALYSIS, ROUTINE W REFLEX MICROSCOPIC
Bilirubin Urine: NEGATIVE
GLUCOSE, UA: NEGATIVE mg/dL
Hgb urine dipstick: NEGATIVE
Ketones, ur: NEGATIVE mg/dL
Leukocytes, UA: NEGATIVE
Nitrite: NEGATIVE
PROTEIN: NEGATIVE mg/dL
SPECIFIC GRAVITY, URINE: 1 — AB (ref 1.005–1.030)
pH: 7 (ref 5.0–8.0)

## 2016-11-06 LAB — ETHANOL: ALCOHOL ETHYL (B): 148 mg/dL — AB (ref <5)

## 2016-11-06 LAB — I-STAT CG4 LACTIC ACID, ED: LACTIC ACID, VENOUS: 2.41 mmol/L — AB (ref 0.5–1.9)

## 2016-11-06 LAB — PROTIME-INR
INR: 1.01
PROTHROMBIN TIME: 13.3 s (ref 11.4–15.2)

## 2016-11-06 LAB — I-STAT CHEM 8, ED
BUN: 3 mg/dL — AB (ref 6–20)
CALCIUM ION: 1.11 mmol/L — AB (ref 1.15–1.40)
CHLORIDE: 107 mmol/L (ref 101–111)
Creatinine, Ser: 0.9 mg/dL (ref 0.61–1.24)
Glucose, Bld: 88 mg/dL (ref 65–99)
HEMATOCRIT: 49 % (ref 39.0–52.0)
Hemoglobin: 16.7 g/dL (ref 13.0–17.0)
POTASSIUM: 3.1 mmol/L — AB (ref 3.5–5.1)
SODIUM: 142 mmol/L (ref 135–145)
TCO2: 20 mmol/L (ref 0–100)

## 2016-11-06 MED ORDER — NAPROXEN 250 MG PO TABS
500.0000 mg | ORAL_TABLET | Freq: Once | ORAL | Status: AC
  Administered 2016-11-06: 500 mg via ORAL
  Filled 2016-11-06: qty 2

## 2016-11-06 MED ORDER — ALBUTEROL (5 MG/ML) CONTINUOUS INHALATION SOLN
10.0000 mg/h | INHALATION_SOLUTION | Freq: Once | RESPIRATORY_TRACT | Status: AC
  Administered 2016-11-06: 10 mg/h via RESPIRATORY_TRACT

## 2016-11-06 MED ORDER — METHOCARBAMOL 500 MG PO TABS: 500.0000 mg | ORAL_TABLET | Freq: Two times a day (BID) | ORAL | 0 refills | Status: DC

## 2016-11-06 MED ORDER — IBUPROFEN 600 MG PO TABS: 600.0000 mg | ORAL_TABLET | Freq: Four times a day (QID) | ORAL | 0 refills | Status: DC | PRN

## 2016-11-06 MED ORDER — PREDNISONE 10 MG PO TABS: 60.0000 mg | ORAL_TABLET | Freq: Every day | ORAL | 0 refills | Status: DC

## 2016-11-06 MED ORDER — METOCLOPRAMIDE HCL 5 MG/ML IJ SOLN: 10.0000 mg | Freq: Once | INTRAMUSCULAR | Status: DC

## 2016-11-06 MED ORDER — FENTANYL CITRATE (PF) 100 MCG/2ML IJ SOLN
50.0000 ug | Freq: Once | INTRAMUSCULAR | Status: AC
  Administered 2016-11-06: 50 ug via INTRAVENOUS

## 2016-11-06 MED ORDER — FENTANYL CITRATE (PF) 100 MCG/2ML IJ SOLN
INTRAMUSCULAR | Status: AC
  Filled 2016-11-06: qty 2

## 2016-11-06 MED ORDER — PREDNISONE 20 MG PO TABS
60.0000 mg | ORAL_TABLET | Freq: Once | ORAL | Status: AC
Start: 2016-11-06 — End: 2016-11-06
  Administered 2016-11-06: 60 mg via ORAL
  Filled 2016-11-06: qty 3

## 2016-11-06 MED ORDER — KETOROLAC TROMETHAMINE 15 MG/ML IJ SOLN: 15.0000 mg | Freq: Once | INTRAMUSCULAR | Status: DC

## 2016-11-06 NOTE — ED Provider Notes (Addendum)
[facility] DEPT Provider Note   CSN: [id number] Arrival date & time: 11/05/16  2250     History   Chief Complaint Chief Complaint  Patient presents with  . Fall  . Hip Pain  . Chest Pain    HPI [name] is a 55 y.o. male.  HPI Pt comes in with cc of fall. Pt is a poor historian. Per EMS pt was found outside a home, and had complained of falling down 4 stairs. Pt complains of headaches, chest pain, L hip pain and neck pain. Pt states that he had a fall. He admits to drinking earlier, but not heavy. Pt also complains of chest pain on the R side and trouble breathing. He has hx of asthma/copd.  Past Medical History:  Diagnosis Date  . COPD (chronic obstructive pulmonary disease) ([place])   . Hypertension     Patient Active Problem List   Diagnosis Date Noted  . Rupture of right distal biceps tendon 03/01/2016  . Pain in right lower leg 01/30/2016  . Chest pain 04/26/2015  . Hypokalemia 04/26/2015  . COPD exacerbation ([place]) 04/26/2015  . Pain in the chest   . Fall 07/22/2014  . Multiple fractures of ribs of right side 07/22/2014  . Pneumothorax on right 07/22/2014  . Traumatic pneumohemothorax 07/21/2014  . Altered mental state 04/15/2013  . Acute encephalopathy 04/15/2013  . Difficulty speaking 04/15/2013  . Headache(784.0) 04/15/2013  . Tobacco abuse 04/15/2013  . HTN (hypertension) 04/15/2013    Past Surgical History:  Procedure Laterality Date  . CARDIAC CATHETERIZATION    . HIP SURGERY    . KNEE SURGERY         Home Medications    Prior to Admission medications   Medication Sig Start Date End Date Taking? Authorizing Provider  albuterol (PROVENTIL HFA;VENTOLIN HFA) 108 (90 Base) MCG/ACT inhaler Inhale 1-2 puffs into the lungs every 6 (six) hours as needed for wheezing or shortness of breath. Patient not taking: Reported on 11/06/2016 06/14/16   Shary Decamp, PA-C  diazepam (VALIUM) 10 MG tablet Take 1 tablet (10 mg total) by mouth at  bedtime as needed for sleep. Patient not taking: Reported on 11/06/2016 01/30/16   Leandrew Koyanagi, MD  HYDROcodone-acetaminophen (NORCO) 5-325 MG tablet Take 1 tablet by mouth every 6 (six) hours as needed for moderate pain. Patient not taking: Reported on 10/01/2016 09/17/16   Jeannett Senior, PA-C  HYDROcodone-acetaminophen (NORCO/VICODIN) 5-325 MG tablet Take 2 tablets by mouth every 6 (six) hours as needed for severe pain (nightly). Patient not taking: Reported on 11/06/2016 10/01/16   Valda Lamb, MD  ibuprofen (ADVIL,MOTRIN) 600 MG tablet Take 1 tablet (600 mg total) by mouth every 6 (six) hours as needed. 11/06/16   Varney Biles, MD  methocarbamol (ROBAXIN) 500 MG tablet Take 1 tablet (500 mg total) by mouth 2 (two) times daily. 11/06/16   Varney Biles, MD  naproxen (NAPROSYN) 500 MG tablet Take 1 tablet (500 mg total) by mouth 2 (two) times daily with a meal. Patient not taking: Reported on 11/06/2016 01/30/16   Leandrew Koyanagi, MD  oxyCODONE-acetaminophen (PERCOCET/ROXICET) 5-325 MG tablet Take 1-2 tablets by mouth every 4 (four) hours as needed for severe pain. Patient not taking: Reported on 11/06/2016 08/05/15   Blanchie Dessert, MD  predniSONE (DELTASONE) 10 MG tablet Take 6 tablets (60 mg total) by mouth daily. 11/06/16   Varney Biles, MD    Family History Family History  Problem Relation Age of Onset  . CAD  Father   . Bone cancer Mother     Social History Social History  Substance Use Topics  . Smoking status: Current Every Day Smoker    Packs/day: 0.50  . Smokeless tobacco: Never Used  . Alcohol use Yes     Comment: on occasion     Allergies   Patient has no known allergies.   Review of Systems Review of Systems  All other systems reviewed and are negative.    Physical Exam Updated Vital Signs BP (!) 111/58   Pulse 67   Temp 97.8 F (36.6 C) (Oral)   Resp (!) 21   Ht 6\' 2"  (1.88 m)   Wt 108.9 kg (240 lb)   SpO2 96%   BMI 30.81 kg/m   Physical  Exam  Constitutional: He is oriented to person, place, and time. He appears well-developed.  HENT:  Head: Normocephalic and atraumatic.  Eyes: Pupils are equal, round, and reactive to light. Conjunctivae and EOM are normal.  Neck: Normal range of motion. Neck supple.  Cardiovascular: Normal rate and regular rhythm.   Pulmonary/Chest: Effort normal and breath sounds normal.  Abdominal: Soft. Bowel sounds are normal. He exhibits no distension and no mass. There is no tenderness. There is no rebound and no guarding.  Musculoskeletal: He exhibits no deformity.  Pt has a R sided chest wall tenderness, L hip tenderness and midline cspine tenderness   OTHERWISE Head to toe evaluation shows no hematoma, bleeding of the scalp, no facial abrasions, no spine step offs, crepitus of the chest or neck, no tenderness to palpation of the bilateral upper and lower extremities, no gross deformities, no chest tenderness, no pelvic pain.   Neurological: He is alert and oriented to person, place, and time.  Skin: Skin is warm.  Nursing note and vitals reviewed.    ED Treatments / Results  Labs (all labs ordered are listed, but only abnormal results are displayed) Labs Reviewed  COMPREHENSIVE METABOLIC PANEL - Abnormal; Notable for the following:       Result Value   Potassium 3.1 (*)    CO2 20 (*)    BUN <5 (*)    ALT 14 (*)    All other components within normal limits  ETHANOL - Abnormal; Notable for the following:    Alcohol, Ethyl (B) 148 (*)    All other components within normal limits  URINALYSIS, ROUTINE W REFLEX MICROSCOPIC - Abnormal; Notable for the following:    Color, Urine COLORLESS (*)    Specific Gravity, Urine 1.000 (*)    All other components within normal limits  I-STAT CHEM 8, ED - Abnormal; Notable for the following:    Potassium 3.1 (*)    BUN 3 (*)    Calcium, Ion 1.11 (*)    All other components within normal limits  I-STAT CG4 LACTIC ACID, ED - Abnormal; Notable for the  following:    Lactic Acid, Venous 2.41 (*)    All other components within normal limits  CBC  PROTIME-INR  SAMPLE TO BLOOD BANK    EKG  EKG Interpretation None       Radiology Ct Head Wo Contrast  Result Date: 11/06/2016 CLINICAL DATA:  Status post fall. Right-sided head pain. Amnesia. Concern for cervical spine injury. Initial encounter. EXAM: CT HEAD WITHOUT CONTRAST CT CERVICAL SPINE WITHOUT CONTRAST TECHNIQUE: Multidetector CT imaging of the head and cervical spine was performed following the standard protocol without intravenous contrast. Multiplanar CT image reconstructions of the cervical spine were  also generated. COMPARISON:  CT of the head performed 08/20/2015, and CT of the cervical spine performed 07/21/2014. MRI/MRA of the head and neck performed 04/15/2013 FINDINGS: CT HEAD FINDINGS Brain: No evidence of acute infarction, hemorrhage, hydrocephalus, extra-axial collection or mass lesion/mass effect. The posterior fossa, including the cerebellum, brainstem and fourth ventricle, is within normal limits. The third and lateral ventricles, and basal ganglia are unremarkable in appearance. The cerebral hemispheres are symmetric in appearance, with normal gray-white differentiation. No mass effect or midline shift is seen. Vascular: No hyperdense vessel or unexpected calcification. Skull: There is no evidence of fracture; visualized osseous structures are unremarkable in appearance. Sinuses/Orbits: The orbits are within normal limits. The paranasal sinuses and mastoid air cells are well-aerated. Other: No significant soft tissue abnormalities are seen. CT CERVICAL SPINE FINDINGS Alignment: Normal. Skull base and vertebrae: No acute fracture. No primary bone lesion or focal pathologic process. Soft tissues and spinal canal: No prevertebral fluid or swelling. No visible canal hematoma. Disc levels: Intervertebral disc spaces are preserved. Anterior disc osteophyte complexes are noted along the  lower cervical spine. Upper chest: The thyroid gland is unremarkable in appearance. The visualized lung apices are clear. Other: No additional soft tissue abnormalities are seen. IMPRESSION: 1. No evidence of traumatic intracranial injury or fracture. 2. No evidence of fracture or subluxation along the cervical spine. 3. Minimal degenerative change along the lower cervical spine. Electronically Signed   By: Garald Balding M.D.   On: 11/06/2016 00:55   Ct Cervical Spine Wo Contrast  Result Date: 11/06/2016 CLINICAL DATA:  Status post fall. Right-sided head pain. Amnesia. Concern for cervical spine injury. Initial encounter. EXAM: CT HEAD WITHOUT CONTRAST CT CERVICAL SPINE WITHOUT CONTRAST TECHNIQUE: Multidetector CT imaging of the head and cervical spine was performed following the standard protocol without intravenous contrast. Multiplanar CT image reconstructions of the cervical spine were also generated. COMPARISON:  CT of the head performed 08/20/2015, and CT of the cervical spine performed 07/21/2014. MRI/MRA of the head and neck performed 04/15/2013 FINDINGS: CT HEAD FINDINGS Brain: No evidence of acute infarction, hemorrhage, hydrocephalus, extra-axial collection or mass lesion/mass effect. The posterior fossa, including the cerebellum, brainstem and fourth ventricle, is within normal limits. The third and lateral ventricles, and basal ganglia are unremarkable in appearance. The cerebral hemispheres are symmetric in appearance, with normal gray-white differentiation. No mass effect or midline shift is seen. Vascular: No hyperdense vessel or unexpected calcification. Skull: There is no evidence of fracture; visualized osseous structures are unremarkable in appearance. Sinuses/Orbits: The orbits are within normal limits. The paranasal sinuses and mastoid air cells are well-aerated. Other: No significant soft tissue abnormalities are seen. CT CERVICAL SPINE FINDINGS Alignment: Normal. Skull base and  vertebrae: No acute fracture. No primary bone lesion or focal pathologic process. Soft tissues and spinal canal: No prevertebral fluid or swelling. No visible canal hematoma. Disc levels: Intervertebral disc spaces are preserved. Anterior disc osteophyte complexes are noted along the lower cervical spine. Upper chest: The thyroid gland is unremarkable in appearance. The visualized lung apices are clear. Other: No additional soft tissue abnormalities are seen. IMPRESSION: 1. No evidence of traumatic intracranial injury or fracture. 2. No evidence of fracture or subluxation along the cervical spine. 3. Minimal degenerative change along the lower cervical spine. Electronically Signed   By: Garald Balding M.D.   On: 11/06/2016 00:55   Dg Pelvis Portable  Result Date: 11/05/2016 CLINICAL DATA:  Fall EXAM: PORTABLE PELVIS 1-2 VIEWS COMPARISON:  CT 07/21/2014 FINDINGS: SI  joints are symmetric. The pubic symphysis appears intact. Both femoral heads project in joint. Moderate degenerative changes of both hips. Questionable lucency in the right superior pubic ramus. IMPRESSION: No no dislocation. Questionable lucency at the right superior pubic ramus. Electronically Signed   By: Donavan Foil M.D.   On: 11/05/2016 23:53   Dg Chest Portable 1 View  Result Date: 11/05/2016 CLINICAL DATA:  Fall with rib injury EXAM: PORTABLE CHEST 1 VIEW COMPARISON:  06/14/2016 FINDINGS: No acute pulmonary infiltrate or effusion. Heart size upper limits of normal. No pneumothorax. Linear scar in the right mid lung. Old right fifth rib deformity. IMPRESSION: 1. Negative for pleural effusion or pneumothorax 2. Small amount of linear scarring in the right mid lung Electronically Signed   By: Donavan Foil M.D.   On: 11/05/2016 23:17    Procedures Procedures (including critical care time)   CRITICAL CARE Performed by: Varney Biles   Total critical care time: 38 minutes for repeat assessments for copd exacerbations  Critical  care time was exclusive of separately billable procedures and treating other patients.  Critical care was necessary to treat or prevent imminent or life-threatening deterioration.  Critical care was time spent personally by me on the following activities: development of treatment plan with patient and/or surrogate as well as nursing, discussions with consultants, evaluation of patient's response to treatment, examination of patient, obtaining history from patient or surrogate, ordering and performing treatments and interventions, ordering and review of laboratory studies, ordering and review of radiographic studies, pulse oximetry and re-evaluation of patient's condition.   EKG   Date: 11/06/2016  Rate: 57  Rhythm: normal sinus rhythm  QRS Axis: normal  Intervals: normal  ST/T Wave abnormalities: normal  Conduction Disutrbances: none  Narrative Interpretation: unremarkable      Medications Ordered in ED Medications  predniSONE (DELTASONE) tablet 60 mg (not administered)  naproxen (NAPROSYN) tablet 500 mg (not administered)  fentaNYL (SUBLIMAZE) injection 50 mcg (50 mcg Intravenous Given 11/05/16 2346)  albuterol (PROVENTIL,VENTOLIN) solution continuous neb (10 mg/hr Nebulization Given 11/05/16 2331)  fentaNYL (SUBLIMAZE) injection 50 mcg (50 mcg Intravenous Given 11/06/16 0050)  albuterol (PROVENTIL,VENTOLIN) solution continuous neb (10 mg/hr Nebulization Given by Other 11/06/16 0204)     Initial Impression / Assessment and Plan / ED Course  I have reviewed the triage vital signs and the nursing notes.  Pertinent labs & imaging results that were available during my care of the patient were reviewed by me and considered in my medical decision making (see chart for details).  Clinical Course as of Nov 06 337  Tue Nov 06, 2016  0157 Pt reassessed. He is still wheezing. Will order more nebs. He denies any new cough or phlegm. Pt's imaging is neg Results from the ER workup  discussed with the patient face to face and all questions answered to the best of my ability.   [AN]  0158 Pt's repeat abd exam is unchanged. I dont think pt needs a CT scan at this time as I am not suspecting internal bleeding.  [AN]  0200 Repeat nebs ordered.  [AN]  1443 Repeat exam reveals clearing of wheezing in all lung fields. Patient is not in any respiratory distress nor is there hypoxia.  Pt's abd exam and chest wall exam still unchanged. VSS and WNL. Will d/c with IS, for we suspect rib contusion.  [AN]    Clinical Course User Index [AN] Varney Biles, MD    Pt comes in with cc of fall.  DDx  includes: - Mechanical falls - ICH - Fractures - Contusions - Soft tissue injury  Appropriate imaging ordered.  Pt also will get nebs for his wheezing. Will reassess.   Final Clinical Impressions(s) / ED Diagnoses   Final diagnoses:  COPD with acute exacerbation ([place])  Contusion of rib, unspecified laterality, initial encounter  Alcoholic intoxication without complication ([facility])    New Prescriptions New Prescriptions   IBUPROFEN (ADVIL,MOTRIN) 600 MG TABLET    Take 1 tablet (600 mg total) by mouth every 6 (six) hours as needed.   METHOCARBAMOL (ROBAXIN) 500 MG TABLET    Take 1 tablet (500 mg total) by mouth 2 (two) times daily.   PREDNISONE (DELTASONE) 10 MG TABLET    Take 6 tablets (60 mg total) by mouth daily.     Varney Biles, MD 11/06/16 7342    Varney Biles, MD 11/06/16 [phone]

## 2016-11-06 NOTE — ED Notes (Signed)
Dr. Kathrynn Humble notified of elevated CG-4

## 2016-11-06 NOTE — Discharge Instructions (Signed)
We saw you after you had a fall. All the imaging is normal. You might have a small rib fracture or have rib contusion. Please use the inspiratory spirometer to prevent infection and take the meds as prescribed.  You were also noted to be wheezing. Please take albuterol as needed every 4 hours. Please take the medications prescribed. Please refrain from smoking or smoke exposure. Please see a primary care doctor in 1 week. Return to the ER if your symptoms worsen.

## 2017-02-20 ENCOUNTER — Institutional Professional Consult (permissible substitution): Payer: Self-pay | Admitting: Internal Medicine

## 2017-02-28 ENCOUNTER — Ambulatory Visit: Payer: Medicaid Other | Admitting: Podiatry

## 2017-02-28 ENCOUNTER — Encounter: Payer: Self-pay | Admitting: Podiatry

## 2017-02-28 ENCOUNTER — Ambulatory Visit (INDEPENDENT_AMBULATORY_CARE_PROVIDER_SITE_OTHER): Payer: Medicaid Other

## 2017-02-28 VITALS — BP 156/97 | HR 73

## 2017-02-28 DIAGNOSIS — M722 Plantar fascial fibromatosis: Secondary | ICD-10-CM

## 2017-02-28 DIAGNOSIS — M659 Synovitis and tenosynovitis, unspecified: Secondary | ICD-10-CM | POA: Diagnosis not present

## 2017-02-28 DIAGNOSIS — M79671 Pain in right foot: Secondary | ICD-10-CM

## 2017-02-28 DIAGNOSIS — M79672 Pain in left foot: Principal | ICD-10-CM

## 2017-02-28 MED ORDER — MELOXICAM 15 MG PO TABS: 15.0000 mg | ORAL_TABLET | Freq: Every day | ORAL | 2 refills | Status: DC

## 2017-02-28 NOTE — Progress Notes (Signed)
   Subjective:    Patient ID: [name], male    DOB: Sep 13, 1960, 56 y.o.   MRN: [id number]  HPI  Chief Complaint  Patient presents with  . Foot Pain    B/L arch pain x 9 yrs. Has been getting cortizone injections by PCP   56 y.o. male presents with the above complaint. Reports bilateral arch pain for 9 years reports left greater than right.  Endorses burning sensation.  Has been getting cortisone shots by his PCP.  Past Medical History:  Diagnosis Date  . COPD (chronic obstructive pulmonary disease) ([place])   . Hypertension    Past Surgical History:  Procedure Laterality Date  . CARDIAC CATHETERIZATION    . HIP SURGERY    . KNEE SURGERY      Current Outpatient Medications:  .  albuterol (PROVENTIL HFA;VENTOLIN HFA) 108 (90 Base) MCG/ACT inhaler, Inhale 1-2 puffs into the lungs every 6 (six) hours as needed for wheezing or shortness of breath., Disp: 1 Inhaler, Rfl: 0 .  acetaminophen-codeine (TYLENOL #3) 300-30 MG tablet, One every 4 hours as needed for cough, Disp: 40 tablet, Rfl: 0 .  amoxicillin-clavulanate (AUGMENTIN) 875-125 MG tablet, Take 1 tablet by mouth 2 (two) times daily., Disp: 20 tablet, Rfl: 0 .  budesonide-formoterol (SYMBICORT) 80-4.5 MCG/ACT inhaler, Inhale 2 puffs into the lungs 2 (two) times daily., Disp: 1 Inhaler, Rfl: 11 .  famotidine (PEPCID) 20 MG tablet, One at bedtime, Disp: 30 tablet, Rfl: 2 .  methylPREDNISolone (MEDROL DOSEPAK) 4 MG TBPK tablet, 6 Day Taper Pack. Take as Directed., Disp: 21 tablet, Rfl: 0 .  pantoprazole (PROTONIX) 40 MG tablet, Take 1 tablet (40 mg total) by mouth daily. Take 30-60 min before first meal of the day, Disp: 30 tablet, Rfl: 2  No Known Allergies  Review of Systems  All other systems reviewed and are negative.      Objective:   Physical Exam Vitals:   02/28/17 1043  BP: (!) 156/97  Pulse: 73   General AA&O x3. Normal mood and affect.  Vascular Dorsalis pedis and posterior tibial pulses  present 2+  bilaterally  Capillary refill normal to all digits. Pedal hair growth normal.  Neurologic Epicritic sensation grossly present bilaterally.  Dermatologic No open lesions. Interspaces clear of maceration. Nails well groomed and normal in appearance.  Orthopedic: MMT 5/5 in dorsiflexion, plantarflexion, inversion, and eversion bilaterally. Tender to palpation at the calcaneal tuber left. No pain with calcaneal squeeze bilaterally. Ankle ROM diminished range of motion bilaterally. Silfverskiold Test: positive bilaterally.   Radiographs: Taken and reviewed. No acute fractures. No evidence of calcaneal stress fracture.      Assessment & Plan:  Evaluated and treated all questions answered  Plantar fasciitis left -Educated on stretching and icing. -Injection as below. -X-rays reviewed as above -Rx meloxicam  Procedure: Injection Tendon/Ligament Consent: Verbal consent obrained. Location: Left plantar fascia at the glabrous junction; medial approach. Skin Prep: Alcohol. Injectate: 1 cc 0.5% marcaine plain, 1 cc dexamethasone phosphate, 0.5 cc kenalog 10. Disposition: Patient tolerated procedure well. Injection site dressed with a band-aid.

## 2017-02-28 NOTE — Patient Instructions (Signed)

## 2017-03-08 ENCOUNTER — Encounter: Payer: Self-pay | Admitting: Internal Medicine

## 2017-03-08 ENCOUNTER — Ambulatory Visit (INDEPENDENT_AMBULATORY_CARE_PROVIDER_SITE_OTHER)
Admission: RE | Admit: 2017-03-08 | Discharge: 2017-03-08 | Disposition: A | Discharge status: Home / Self Care | Payer: Medicaid Other | Source: Ambulatory Visit | Attending: Internal Medicine | Admitting: Internal Medicine

## 2017-03-08 ENCOUNTER — Ambulatory Visit (INDEPENDENT_AMBULATORY_CARE_PROVIDER_SITE_OTHER): Payer: Medicaid Other | Admitting: Internal Medicine

## 2017-03-08 VITALS — BP 140/90 | HR 74 | Ht 74.0 in | Wt 250.0 lb

## 2017-03-08 DIAGNOSIS — F17210 Nicotine dependence, cigarettes, uncomplicated: Secondary | ICD-10-CM

## 2017-03-08 DIAGNOSIS — R05 Cough: Secondary | ICD-10-CM

## 2017-03-08 DIAGNOSIS — J449 Chronic obstructive pulmonary disease, unspecified: Secondary | ICD-10-CM | POA: Diagnosis not present

## 2017-03-08 DIAGNOSIS — R059 Cough, unspecified: Secondary | ICD-10-CM

## 2017-03-08 MED ORDER — BUDESONIDE-FORMOTEROL FUMARATE 80-4.5 MCG/ACT IN AERO: 2.0000 | INHALATION_SPRAY | Freq: Two times a day (BID) | RESPIRATORY_TRACT | 0 refills | Status: DC

## 2017-03-08 MED ORDER — PANTOPRAZOLE SODIUM 40 MG PO TBEC: 40.0000 mg | DELAYED_RELEASE_TABLET | Freq: Every day | ORAL | 2 refills | Status: DC

## 2017-03-08 MED ORDER — FAMOTIDINE 20 MG PO TABS: ORAL_TABLET | ORAL | 2 refills | Status: DC

## 2017-03-08 MED ORDER — ACETAMINOPHEN-CODEINE #3 300-30 MG PO TABS: ORAL_TABLET | ORAL | 0 refills | Status: DC

## 2017-03-08 MED ORDER — BUDESONIDE-FORMOTEROL FUMARATE 80-4.5 MCG/ACT IN AERO: 2.0000 | INHALATION_SPRAY | Freq: Two times a day (BID) | RESPIRATORY_TRACT | 11 refills | Status: DC

## 2017-03-08 NOTE — Patient Instructions (Addendum)
Plan A = Automatic = symbicort 80 Take 2 puffs first thing in am and then another 2 puffs about 12 hours later.  Work on inhaler technique:  relax and gently blow all the way out then take a nice smooth deep breath back in, triggering the inhaler at same time you start breathing in.  Hold for up to 5 seconds if you can. Blow out thru nose. Rinse and gargle with water when done    Pantoprazole (protonix) 40 mg   Take  30-60 min before first meal of the day and Pepcid (famotidine)  20 mg one @  bedtime until return to office - this is the best way to tell whether stomach acid is contributing to your problem.    Plan B = Backup Only use your albuterol as a rescue medication to be used if you can't catch your breath by resting or doing a relaxed purse lip breathing pattern.  - The less you use it, the better it will work when you need it. - Ok to use the inhaler up to 2 puffs  every 4 hours if you must but call for appointment if use goes up over your usual need - Don't leave home without it !!  (think of it like the spare tire for your car)       For cough >  mucinex dm up to 1200 mg every 12 hours and supplement with tylenol #3  every 4 hours if needed    Please see patient coordinator before you leave today  to schedule sinus CT  Please remember to go to the  x-ray department downstairs in the basement  for your tests - we will call you with the results when they are available.       Please schedule a follow up office visit in 4 weeks, sooner if needed  with all medications /inhalers/ solutions in hand so we can verify exactly what you are taking. This includes all medications from all doctors and over the counters

## 2017-03-08 NOTE — Progress Notes (Signed)
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 Pulmonary Consult    Referred by Dr. Alphonzo Grieve for eval of COPD. Pt states he was dxed with COPD in 2015 and his breathing has been progressively worse since then. He c/o SOB with walking 15-20 ft. He has a prod cough with white to green sputum.  He also c/o occ wheezing and chest tightness.  He has to sleep propped up due to SOB when he lies down. He uses an albuterol inhaler 2-3 x per day.   onset of sob x 7 years prior to OV  And gradually worse with increased need for saba  And even sobat rest = 5 h since last saba and sometimes noct as well  No response to last rx with prednisone  maint complaint though is incessant coughing fits, worse at hs so sleeping very poorly/ variably productive but mostly just dry hacking assoc with generalized ant chest discomfort from coughing fits s localization or true pleuritic features Assoc with overt HB and nasal congestion  No obvious patterns in day to day or daytime variability or assoc  mucus plugs or hemoptysis  . No unusual exposure hx or h/o childhood pna/ asthma or knowledge of premature birth.   Also denies any obvious fluctuation of symptoms with weather or environmental changes or other aggravating or alleviating factors except as outlined above   Current Allergies, Complete Past Medical History, Past Surgical History, Family History, and Social History were reviewed in [employer] record.  ROS  The following are not active  complaints unless bolded Hoarseness, sore throat, dysphagia, dental problems, itching, sneezing,  nasal congestion or discharge of excess mucus or purulent secretions, ear ache,   fever, chills, sweats, unintended wt loss or wt gain, classically pleuritic or exertional cp,  orthopnea pnd or leg swelling, presyncope, palpitations, abdominal pain, anorexia, nausea, vomiting, diarrhea  or change in bowel habits or change in bladder habits, change in stools or change in urine, dysuria, hematuria,  rash, arthralgias, visual complaints, headache, numbness, weakness or ataxia or problems with walking or coordination,  change in mood/affect or memory.        Current Meds  Medication Sig  . albuterol (PROVENTIL HFA;VENTOLIN HFA) 108 (90 Base) MCG/ACT inhaler Inhale 1-2 puffs into the lungs every 6 (six) hours as needed for wheezing or shortness of breath.         Review of Systems     Objective:   Physical Exam  Somber mod obese bm nad/ talks with eyes closed   Wt Readings from Last 3 Encounters:  03/08/17 250 lb (113.4 kg)  11/05/16 240 lb (108.9 kg)  06/14/16 250 lb (113.4 kg)     Vital signs reviewed - Note on arrival 02 sats  98% on RA       HEENT: nl   turbinates bilaterally, and oropharynx. Nl external ear canals without cough reflex -   full top denture/ lower dentition  ok    NECK :  without JVD/Nodes/TM/ nl carotid upstrokes bilaterally   LUNGS: no acc muscle use,  Nl contour chest with coarse insp and exp rhonchi, lots of upper airway noise on exp     CV:  RRR  no s3 or murmur or increase in P2, and no edema   ABD:  soft and nontender with nl inspiratory excursion in the supine position. No bruits or organomegaly appreciated, bowel sounds nl  MS:  Nl gait/ ext warm without deformities, calf tenderness, cyanosis or clubbing No obvious joint restrictions   SKIN: warm and dry without lesions    NEURO:  alert, depressed affect,  nl sensorium with  no motor or cerebellar  deficits apparent.     CXR PA and Lateral:   03/08/2017 :    I personally reviewed images and agree with radiology impression as follows:   No evidence of acute pulmonary consolidation.  Chronic perifissural thickening in the right upper lobe. In correlation to prior studies from 2016, this corresponds to the site of prior chest tube placement, and may represent posttraumatic/postprocedural changes.      Assessment:

## 2017-03-08 NOTE — Assessment & Plan Note (Signed)

## 2017-03-09 ENCOUNTER — Encounter: Payer: Self-pay | Admitting: Internal Medicine

## 2017-03-09 NOTE — Assessment & Plan Note (Signed)
Spirometry 03/08/2017  FEV1 1.63 (44%)  Ratio 68 5 h p saba Spirometry 03/08/2017  FEV1 1.95 (53%)  Ratio 73 15 m p duoneb - 03/08/2017  After extensive coaching inhaler device  effectiveness =    75% > try symb 80 2bid for AB component   Plus max rx for gerd    Symptoms are   disproportionate to objective findings and not clear this is actually much of a  lung problem but pt does appear to have difficult to sort out respiratory symptoms of unknown origin for which  DDX  = almost all start with A and  include Adherence, Ace Inhibitors, Acid Reflux, Active Sinus Disease, Alpha 1 Antitripsin deficiency, Anxiety masquerading as Airways dz,  ABPA,  Allergy(esp in young), Aspiration (esp in elderly), Adverse effects of meds,  Active smokers, A bunch of PE's (a small clot burden can't cause this syndrome unless there is already severe underlying pulm or vascular dz with poor reserve) plus two Bs  = Bronchiectasis and Beta blocker use..and one C= CHF   Adherence is always the initial "prime suspect" and is a multilayered concern that requires a "trust but verify" approach in every patient - starting with knowing how to use medications, especially inhalers, correctly, keeping up with refills and understanding the fundamental difference between maintenance and prns vs those medications only taken for a very short course and then stopped and not refilled.  - see hfa teaching above - return with all meds in hand using a trust but verify approach to confirm accurate Medication  Reconciliation The principal here is that until we are certain that the  patients are doing what we've asked, it makes no sense to ask them to do more.   Active smoking > also top of the usual  list of suspects  ? Acid (or non-acid) GERD > always difficult to exclude as up to 75% of pts in some series report no assoc GI/ Heartburn symptoms> rec max (24h)  acid suppression and diet restrictions/ reviewed and instructions given in writing.    ? Active sinus dz > sinus CT ordered   ? Allergy/asthma > note failure to respond to prednisone argues against a significant allergy here and so approp to try low dose ICS as most of his problem with cough appears to be upper airway and high doses may backfire in this setting   ? Alpha one > very unlikely based on today's spirometry which shows min airflow obst  ? Bronchiectasis > consider HRCT if symptoms remain refractory   Total time devoted to counseling  > 50 % of initial 60 min office visit:  review case with pt/ discussion of options/alternatives/ personally creating written customized instructions  in presence of pt  then going over those specific  Instructions directly with the pt including how to use all of the meds but in particular covering each new medication in detail and the difference between the maintenance= "automatic" meds and the prns using an action plan format for the latter (If this problem/symptom => do that organization reading Left to right).  Please see AVS from this visit for a full list of these instructions which I personally wrote for this pt and  are unique to this visit.

## 2017-03-11 DIAGNOSIS — J449 Chronic obstructive pulmonary disease, unspecified: Secondary | ICD-10-CM | POA: Diagnosis not present

## 2017-03-11 MED ORDER — IPRATROPIUM-ALBUTEROL 0.5-2.5 (3) MG/3ML IN SOLN
3.0000 mL | Freq: Once | RESPIRATORY_TRACT | Status: AC
  Administered 2017-03-11: 3 mL via RESPIRATORY_TRACT

## 2017-03-11 NOTE — Progress Notes (Signed)
Spoke with pt and notified of results per Dr. Wert. Pt verbalized understanding and denied any questions. 

## 2017-03-15 ENCOUNTER — Encounter: Payer: Self-pay | Admitting: Internal Medicine

## 2017-03-15 ENCOUNTER — Ambulatory Visit (INDEPENDENT_AMBULATORY_CARE_PROVIDER_SITE_OTHER)
Admission: RE | Admit: 2017-03-15 | Discharge: 2017-03-15 | Disposition: A | Discharge status: Home / Self Care | Payer: Medicaid Other | Source: Ambulatory Visit | Attending: Internal Medicine | Admitting: Internal Medicine

## 2017-03-15 ENCOUNTER — Other Ambulatory Visit: Payer: Self-pay | Admitting: Internal Medicine

## 2017-03-15 DIAGNOSIS — R05 Cough: Secondary | ICD-10-CM

## 2017-03-15 DIAGNOSIS — J019 Acute sinusitis, unspecified: Secondary | ICD-10-CM

## 2017-03-15 DIAGNOSIS — R059 Cough, unspecified: Secondary | ICD-10-CM

## 2017-03-15 DIAGNOSIS — J449 Chronic obstructive pulmonary disease, unspecified: Secondary | ICD-10-CM

## 2017-03-15 MED ORDER — AMOXICILLIN-POT CLAVULANATE 875-125 MG PO TABS: 1.0000 | ORAL_TABLET | Freq: Two times a day (BID) | ORAL | 0 refills | Status: DC

## 2017-03-15 NOTE — Progress Notes (Signed)
Spoke with pt and notified of results per Dr. Wert. Pt verbalized understanding and denied any questions. 

## 2017-03-22 ENCOUNTER — Ambulatory Visit: Payer: Medicaid Other | Admitting: Podiatry

## 2017-03-22 DIAGNOSIS — M84375A Stress fracture, left foot, initial encounter for fracture: Secondary | ICD-10-CM

## 2017-03-22 DIAGNOSIS — M722 Plantar fascial fibromatosis: Secondary | ICD-10-CM | POA: Diagnosis not present

## 2017-03-22 MED ORDER — METHYLPREDNISOLONE 4 MG PO TBPK
ORAL_TABLET | ORAL | 0 refills | Status: DC
Start: 2017-03-22 — End: 2017-12-20

## 2017-03-23 NOTE — Progress Notes (Signed)
  Subjective:  Patient ID: DMITRY MACOMBER, male    DOB: 29-Nov-1960,  MRN: [id number]  56 y.o. male returns for left foot pain.  States the injection somewhat helped after his last visit however patient has had recurrence of left foot pain and inability to bear weight.  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  Endorses that he has taken his family members for narcotic pain medication attempt to alleviate the pain in his left foot  Objective:  There were no vitals filed for this visit. General AA&O x3. Normal mood and affect.  Vascular Pedal pulses palpable.  Neurologic Epicritic sensation grossly intact.  Dermatologic No open lesions. Skin normal texture and turgor.  Orthopedic: Pain to palpation left fifth metatarsal with pain with tuning fork   Assessment & Plan:  Patient was evaluated and treated and all questions answered.  Left lateral foot pain concern for stress fracture -Rx Medrol pack for pain and inflammation -Dispensed surgical shoe left foot. -Advised icing and staying off the foot as much as possible  Return in about 4 weeks (around 04/19/2017) for Plantar fasciitis.

## 2017-03-27 ENCOUNTER — Other Ambulatory Visit: Payer: Self-pay | Admitting: Podiatry

## 2017-03-27 DIAGNOSIS — M722 Plantar fascial fibromatosis: Secondary | ICD-10-CM

## 2017-04-05 ENCOUNTER — Telehealth: Payer: Self-pay | Admitting: Internal Medicine

## 2017-04-05 ENCOUNTER — Inpatient Hospital Stay: Admission: RE | Admit: 2017-04-05 | Payer: Medicaid Other | Source: Ambulatory Visit

## 2017-04-05 ENCOUNTER — Ambulatory Visit: Payer: Medicaid Other | Admitting: Internal Medicine

## 2017-04-05 NOTE — Telephone Encounter (Signed)
On noted. FYI MW

## 2017-04-10 ENCOUNTER — Encounter (HOSPITAL_COMMUNITY): Payer: Self-pay

## 2017-04-10 ENCOUNTER — Emergency Department (HOSPITAL_COMMUNITY)
Admission: EM | Admit: 2017-04-10 | Discharge: 2017-04-10 | Disposition: A | Discharge status: Home / Self Care | Payer: Medicaid Other | Attending: Emergency Medicine | Admitting: Emergency Medicine

## 2017-04-10 ENCOUNTER — Other Ambulatory Visit: Payer: Self-pay

## 2017-04-10 DIAGNOSIS — Y999 Unspecified external cause status: Secondary | ICD-10-CM | POA: Diagnosis not present

## 2017-04-10 DIAGNOSIS — F17210 Nicotine dependence, cigarettes, uncomplicated: Secondary | ICD-10-CM | POA: Diagnosis not present

## 2017-04-10 DIAGNOSIS — J449 Chronic obstructive pulmonary disease, unspecified: Secondary | ICD-10-CM | POA: Insufficient documentation

## 2017-04-10 DIAGNOSIS — Y929 Unspecified place or not applicable: Secondary | ICD-10-CM | POA: Insufficient documentation

## 2017-04-10 DIAGNOSIS — W293XXA Contact with powered garden and outdoor hand tools and machinery, initial encounter: Secondary | ICD-10-CM | POA: Diagnosis not present

## 2017-04-10 DIAGNOSIS — S61211A Laceration without foreign body of left index finger without damage to nail, initial encounter: Secondary | ICD-10-CM

## 2017-04-10 DIAGNOSIS — Y9389 Activity, other specified: Secondary | ICD-10-CM | POA: Insufficient documentation

## 2017-04-10 DIAGNOSIS — I10 Essential (primary) hypertension: Secondary | ICD-10-CM | POA: Insufficient documentation

## 2017-04-10 MED ORDER — LIDOCAINE HCL 2 % IJ SOLN
10.0000 mL | Freq: Once | INTRAMUSCULAR | Status: AC
  Administered 2017-04-10: 200 mg
  Filled 2017-04-10: qty 20

## 2017-04-10 NOTE — ED Notes (Signed)
The pt left without his discharge papers

## 2017-04-10 NOTE — ED Provider Notes (Signed)
[facility] Provider Note   CSN: [id number] Arrival date & time: 04/10/17  1429     History   Chief Complaint Chief Complaint  Patient presents with  . Finger Injury    HPI [name] is a 57 y.o. male.  HPI   57 year old male presents today with a laceration to his left finger.  Patient reports he was using a metal grinder when he cut up along the dorsal aspect of the left pointer finger.  He notes a small laceration, no other injuries.  He reports his tetanus vaccination is up-to-date.  He reports taking ibuprofen prior to arrival.  Past Medical History:  Diagnosis Date  . COPD (chronic obstructive pulmonary disease) ([place])   . Hypertension     Patient Active Problem List   Diagnosis Date Noted  . Acute sinusitis, unspecified 03/15/2017  . Rupture of right distal biceps tendon 03/01/2016  . Pain in right lower leg 01/30/2016  . Chest pain 04/26/2015  . Hypokalemia 04/26/2015  . COPD GOLD 0 / active smoker 04/26/2015  . Pain in the chest   . Fall 07/22/2014  . Multiple fractures of ribs of right side 07/22/2014  . Pneumothorax on right 07/22/2014  . Traumatic pneumohemothorax 07/21/2014  . Altered mental state 04/15/2013  . Acute encephalopathy 04/15/2013  . Difficulty speaking 04/15/2013  . Headache(784.0) 04/15/2013  . Cigarette smoker 04/15/2013  . HTN (hypertension) 04/15/2013    Past Surgical History:  Procedure Laterality Date  . CARDIAC CATHETERIZATION    . HIP SURGERY    . KNEE SURGERY         Home Medications    Prior to Admission medications   Medication Sig Start Date End Date Taking? Authorizing Provider  acetaminophen-codeine (TYLENOL #3) 300-30 MG tablet One every 4 hours as needed for cough 03/08/17   Tanda Rockers, MD  albuterol (PROVENTIL HFA;VENTOLIN HFA) 108 (90 Base) MCG/ACT inhaler Inhale 1-2 puffs into the lungs every 6 (six) hours as needed for wheezing or shortness of breath.  06/14/16   Shary Decamp, PA-C  amoxicillin-clavulanate (AUGMENTIN) 875-125 MG tablet Take 1 tablet by mouth 2 (two) times daily. 03/15/17   Tanda Rockers, MD  budesonide-formoterol (SYMBICORT) 80-4.5 MCG/ACT inhaler Inhale 2 puffs into the lungs 2 (two) times daily. 03/08/17   Tanda Rockers, MD  famotidine (PEPCID) 20 MG tablet One at bedtime 03/08/17   Tanda Rockers, MD  methylPREDNISolone (MEDROL DOSEPAK) 4 MG TBPK tablet 6 Day Taper Pack. Take as Directed. 03/22/17   [name], DPM  pantoprazole (PROTONIX) 40 MG tablet Take 1 tablet (40 mg total) by mouth daily. Take 30-60 min before first meal of the day 03/08/17   Tanda Rockers, MD    Family History Family History  Problem Relation Age of Onset  . CAD Father   . Bone cancer Mother     Social History Social History   Tobacco Use  . Smoking status: Current Every Day Smoker    Packs/day: 1.50    Years: 28.00    Pack years: 42.00  . Smokeless tobacco: Never Used  Substance Use Topics  . Alcohol use: Yes    Comment: on occasion  . Drug use: No     Allergies   Patient has no known allergies.   Review of Systems Review of Systems  All other systems reviewed and are negative.    Physical Exam Updated Vital Signs BP (!) 134/93   Pulse 76  Temp 98 F (36.7 C) (Oral)   Resp 18   Wt 113.4 kg (250 lb)   SpO2 100%   BMI 32.10 kg/m   Physical Exam  Constitutional: He is oriented to person, place, and time. He appears well-developed and well-nourished.  HENT:  Head: Normocephalic and atraumatic.  Eyes: Conjunctivae are normal. Pupils are equal, round, and reactive to light. Right eye exhibits no discharge. Left eye exhibits no discharge. No scleral icterus.  Neck: Normal range of motion. No JVD present. No tracheal deviation present.  Pulmonary/Chest: Effort normal. No stridor.  Musculoskeletal:  1 cm laceration to left dorsal 2nd digit, no deep space involvement.  Full active range of motion of the  finger  Neurological: He is alert and oriented to person, place, and time. Coordination normal.  Psychiatric: He has a normal mood and affect. His behavior is normal. Judgment and thought content normal.  Nursing note and vitals reviewed.    ED Treatments / Results  Labs (all labs ordered are listed, but only abnormal results are displayed) Labs Reviewed - No data to display  EKG  EKG Interpretation None       Radiology No results found.  Procedures .Marland KitchenLaceration Repair Date/Time: 04/10/2017 6:52 PM Performed by: Okey Regal, PA-C Authorized by: Okey Regal, PA-C   Consent:    Consent obtained:  Verbal   Consent given by:  Patient   Risks discussed:  Pain   Alternatives discussed:  No treatment and observation Anesthesia (see MAR for exact dosages):    Anesthesia method:  Local infiltration   Local anesthetic:  Lidocaine 2% w/o epi Laceration details:    Location:  Finger   Finger location:  L index finger   Length (cm):  1 Pre-procedure details:    Preparation:  Patient was prepped and draped in usual sterile fashion Exploration:    Wound extent: no fascia violation noted, no foreign bodies/material noted, no muscle damage noted, no nerve damage noted, no tendon damage noted, no underlying fracture noted and no vascular damage noted     Contaminated: no   Treatment:    Area cleansed with:  Saline   Amount of cleaning:  Standard   Irrigation solution:  Sterile saline   Visualized foreign bodies/material removed: no   Skin repair:    Repair method:  Sutures   Suture size:  4-0   Suture material:  Fast-absorbing gut   Suture technique:  Simple interrupted   Number of sutures:  3 Approximation:    Approximation:  Close   Vermilion border: well-aligned   Post-procedure details:    Dressing:  Antibiotic ointment   Patient tolerance of procedure:  Tolerated well, no immediate complications    (including critical care time)  Medications Ordered in  ED Medications  lidocaine (XYLOCAINE) 2 % (with pres) injection 200 mg (200 mg Infiltration Given 04/10/17 1754)     Initial Impression / Assessment and Plan / ED Course  I have reviewed the triage vital signs and the nursing notes.  Pertinent labs & imaging results that were available during my care of the patient were reviewed by me and considered in my medical decision making (see chart for details).     Final Clinical Impressions(s) / ED Diagnoses   Final diagnoses:  Laceration of left index finger without foreign body without damage to nail, initial encounter   Labs:   Imaging:  Consults:  Therapeutics: Lidocaine  Discharge Meds:   Assessment/Plan: 57 year old male presents today with a small laceration to  his finger.  This will be anesthetized, thoroughly cleansed and closed appropriately.  No deep space involvement, no foreign bodies.  Wound care instructions given, strict return precautions given.  Patient verbalized understanding and agreement to today's plan had no further questions or concerns at time of discharge.    ED Discharge Orders    None       Francee Gentile 04/10/17 1856    Dorie Rank, MD 04/12/17 1140

## 2017-04-10 NOTE — ED Notes (Signed)
Wound cleaned with soap and water no bleeding

## 2017-04-10 NOTE — ED Triage Notes (Signed)
Pt presents to the ed with complaints of obtaining a finger injury while using a metal grinder. Small laceration on his left first finger with minimal bleeding.

## 2017-04-10 NOTE — ED Notes (Signed)
Lidocaine at the bedside

## 2017-04-10 NOTE — ED Notes (Signed)
Small wound lt index finger from a grinder at work no active bleeding.   The pt just wants something for pain

## 2017-04-10 NOTE — Discharge Instructions (Signed)
Please read attached information. If you experience any new or worsening signs or symptoms please return to the emergency room for evaluation. Please follow-up with your primary care provider or specialist as discussed.  °

## 2017-04-10 NOTE — ED Notes (Signed)
Not in the room when I went into discharge him

## 2017-04-10 NOTE — ED Notes (Signed)
Pt reports taking ibuprofen prior to arrival.

## 2017-04-11 ENCOUNTER — Inpatient Hospital Stay: Admission: RE | Admit: 2017-04-11 | Payer: Medicaid Other | Source: Ambulatory Visit

## 2017-04-15 ENCOUNTER — Inpatient Hospital Stay: Admission: RE | Admit: 2017-04-15 | Payer: Medicaid Other | Source: Ambulatory Visit

## 2017-04-16 ENCOUNTER — Ambulatory Visit: Payer: Medicaid Other | Admitting: Internal Medicine

## 2017-04-19 ENCOUNTER — Ambulatory Visit: Payer: Medicaid Other | Admitting: Internal Medicine

## 2017-04-26 ENCOUNTER — Ambulatory Visit: Payer: Medicaid Other | Admitting: Internal Medicine

## 2017-05-17 ENCOUNTER — Other Ambulatory Visit: Payer: Self-pay | Admitting: Internal Medicine

## 2017-05-17 DIAGNOSIS — M1710 Unilateral primary osteoarthritis, unspecified knee: Secondary | ICD-10-CM

## 2017-05-17 DIAGNOSIS — M179 Osteoarthritis of knee, unspecified: Secondary | ICD-10-CM

## 2017-05-18 ENCOUNTER — Emergency Department (HOSPITAL_COMMUNITY): Payer: Medicaid Other

## 2017-05-18 ENCOUNTER — Encounter (HOSPITAL_COMMUNITY): Payer: Self-pay | Admitting: *Deleted

## 2017-05-18 ENCOUNTER — Other Ambulatory Visit: Payer: Self-pay

## 2017-05-18 DIAGNOSIS — Y929 Unspecified place or not applicable: Secondary | ICD-10-CM | POA: Insufficient documentation

## 2017-05-18 DIAGNOSIS — R05 Cough: Secondary | ICD-10-CM | POA: Insufficient documentation

## 2017-05-18 DIAGNOSIS — W01190A Fall on same level from slipping, tripping and stumbling with subsequent striking against furniture, initial encounter: Secondary | ICD-10-CM | POA: Diagnosis not present

## 2017-05-18 DIAGNOSIS — J439 Emphysema, unspecified: Secondary | ICD-10-CM | POA: Insufficient documentation

## 2017-05-18 DIAGNOSIS — F17210 Nicotine dependence, cigarettes, uncomplicated: Secondary | ICD-10-CM | POA: Insufficient documentation

## 2017-05-18 DIAGNOSIS — Y939 Activity, unspecified: Secondary | ICD-10-CM | POA: Diagnosis not present

## 2017-05-18 DIAGNOSIS — S29001A Unspecified injury of muscle and tendon of front wall of thorax, initial encounter: Secondary | ICD-10-CM | POA: Diagnosis present

## 2017-05-18 DIAGNOSIS — Y998 Other external cause status: Secondary | ICD-10-CM | POA: Diagnosis not present

## 2017-05-18 DIAGNOSIS — I10 Essential (primary) hypertension: Secondary | ICD-10-CM | POA: Insufficient documentation

## 2017-05-18 DIAGNOSIS — S20211A Contusion of right front wall of thorax, initial encounter: Secondary | ICD-10-CM | POA: Insufficient documentation

## 2017-05-18 DIAGNOSIS — R079 Chest pain, unspecified: Secondary | ICD-10-CM | POA: Insufficient documentation

## 2017-05-18 MED ORDER — ALBUTEROL SULFATE (2.5 MG/3ML) 0.083% IN NEBU
5.0000 mg | INHALATION_SOLUTION | Freq: Once | RESPIRATORY_TRACT | Status: AC
  Administered 2017-05-19: 5 mg via RESPIRATORY_TRACT
  Filled 2017-05-18: qty 6

## 2017-05-18 NOTE — ED Triage Notes (Signed)
Pt tripped over shoes 2 days ago and hit R ribcage on a table. Has fractured rib in the past with punctured lung. Wheezing noted bilaterally, diminished to R side, denies being sick

## 2017-05-19 ENCOUNTER — Emergency Department (HOSPITAL_COMMUNITY)
Admission: EM | Admit: 2017-05-19 | Discharge: 2017-05-19 | Disposition: A | Discharge status: Home / Self Care | Payer: Medicaid Other | Attending: Emergency Medicine | Admitting: Emergency Medicine

## 2017-05-19 DIAGNOSIS — J439 Emphysema, unspecified: Secondary | ICD-10-CM

## 2017-05-19 DIAGNOSIS — S20211A Contusion of right front wall of thorax, initial encounter: Secondary | ICD-10-CM

## 2017-05-19 DIAGNOSIS — R52 Pain, unspecified: Secondary | ICD-10-CM

## 2017-05-19 MED ORDER — OXYCODONE-ACETAMINOPHEN 5-325 MG PO TABS
1.0000 | ORAL_TABLET | ORAL | Status: DC | PRN
  Administered 2017-05-19: 1 via ORAL
  Filled 2017-05-19: qty 1

## 2017-05-19 MED ORDER — ALBUTEROL SULFATE (2.5 MG/3ML) 0.083% IN NEBU
5.0000 mg | INHALATION_SOLUTION | Freq: Once | RESPIRATORY_TRACT | Status: AC
  Administered 2017-05-19: 5 mg via RESPIRATORY_TRACT
  Filled 2017-05-19: qty 6

## 2017-05-19 MED ORDER — OXYCODONE-ACETAMINOPHEN 5-325 MG PO TABS
1.0000 | ORAL_TABLET | Freq: Once | ORAL | Status: AC
  Administered 2017-05-19: 1 via ORAL
  Filled 2017-05-19: qty 1

## 2017-05-19 MED ORDER — IPRATROPIUM BROMIDE 0.02 % IN SOLN
0.5000 mg | Freq: Once | RESPIRATORY_TRACT | Status: AC
  Administered 2017-05-19: 0.5 mg via RESPIRATORY_TRACT
  Filled 2017-05-19: qty 2.5

## 2017-05-19 MED ORDER — OXYCODONE-ACETAMINOPHEN 5-325 MG PO TABS: 1.0000 | ORAL_TABLET | ORAL | 0 refills | Status: DC | PRN

## 2017-05-19 NOTE — ED Provider Notes (Signed)
[facility] EMERGENCY DEPARTMENT Provider Note   CSN: [id number] Arrival date & time: 05/18/17  2304     History   Chief Complaint Chief Complaint  Patient presents with  . Rib Pain    HPI [name] is a 57 y.o. male.  Patient presents with complaint of right lateral and anterior chest pain since fall 2 days ago. He reports mechanical fall, hitting his right chest on a table during the fall. He has had previous rib fractures and reports similar pain. There is pleuritic pain. He is a ppd smoker with h/o COPD with chronic cough. No fever. No hemoptysis, abdominal pain, nausea or vomiting.   The history is provided by the patient. No language interpreter was used.    Past Medical History:  Diagnosis Date  . COPD (chronic obstructive pulmonary disease) ([place])   . Hypertension     Patient Active Problem List   Diagnosis Date Noted  . Acute sinusitis, unspecified 03/15/2017  . Rupture of right distal biceps tendon 03/01/2016  . Pain in right lower leg 01/30/2016  . Chest pain 04/26/2015  . Hypokalemia 04/26/2015  . COPD GOLD 0 / active smoker 04/26/2015  . Pain in the chest   . Fall 07/22/2014  . Multiple fractures of ribs of right side 07/22/2014  . Pneumothorax on right 07/22/2014  . Traumatic pneumohemothorax 07/21/2014  . Altered mental state 04/15/2013  . Acute encephalopathy 04/15/2013  . Difficulty speaking 04/15/2013  . Headache(784.0) 04/15/2013  . Cigarette smoker 04/15/2013  . HTN (hypertension) 04/15/2013    Past Surgical History:  Procedure Laterality Date  . CARDIAC CATHETERIZATION    . HIP SURGERY    . KNEE SURGERY         Home Medications    Prior to Admission medications   Medication Sig Start Date End Date Taking? Authorizing Provider  albuterol (PROVENTIL HFA;VENTOLIN HFA) 108 (90 Base) MCG/ACT inhaler Inhale 1-2 puffs into the lungs every 6 (six) hours as needed for wheezing or shortness of breath. 06/14/16   Yes Shary Decamp, PA-C  acetaminophen-codeine (TYLENOL #3) 300-30 MG tablet One every 4 hours as needed for cough Patient not taking: Reported on 05/19/2017 03/08/17   Tanda Rockers, MD  amoxicillin-clavulanate (AUGMENTIN) 875-125 MG tablet Take 1 tablet by mouth 2 (two) times daily. Patient not taking: Reported on 05/19/2017 03/15/17   Tanda Rockers, MD  budesonide-formoterol [facility]) 80-4.5 MCG/ACT inhaler Inhale 2 puffs into the lungs 2 (two) times daily. Patient not taking: Reported on 05/19/2017 03/08/17   Tanda Rockers, MD  famotidine (PEPCID) 20 MG tablet One at bedtime Patient not taking: Reported on 05/19/2017 03/08/17   Tanda Rockers, MD  methylPREDNISolone (MEDROL DOSEPAK) 4 MG TBPK tablet 6 Day Taper Pack. Take as Directed. Patient not taking: Reported on 05/19/2017 03/22/17   [name], DPM  pantoprazole (PROTONIX) 40 MG tablet Take 1 tablet (40 mg total) by mouth daily. Take 30-60 min before first meal of the day Patient not taking: Reported on 05/19/2017 03/08/17   Tanda Rockers, MD    Family History Family History  Problem Relation Age of Onset  . CAD Father   . Bone cancer Mother     Social History Social History   Tobacco Use  . Smoking status: Current Every Day Smoker    Packs/day: 1.50    Years: 28.00    Pack years: 42.00  . Smokeless tobacco: Never Used  Substance Use Topics  . Alcohol use:  Yes    Comment: on occasion  . Drug use: No     Allergies   Patient has no known allergies.   Review of Systems Review of Systems  Constitutional: Negative for chills and fever.  HENT: Negative.   Respiratory: Positive for cough.   Cardiovascular: Positive for chest pain.  Gastrointestinal: Negative.  Negative for abdominal pain, nausea and vomiting.  Musculoskeletal: Negative.  Negative for back pain.  Skin: Negative.   Neurological: Negative.      Physical Exam Updated Vital Signs BP (!) 187/103 (BP Location: Left Arm)   Pulse 85   Temp  97.7 F (36.5 C) (Oral)   Resp 15   SpO2 100%   Physical Exam  Constitutional: He is oriented to person, place, and time. He appears well-developed and well-nourished.  HENT:  Head: Normocephalic.  Neck: Normal range of motion. Neck supple.  Cardiovascular: Normal rate and regular rhythm.  No murmur heard. Pulmonary/Chest: Effort normal and breath sounds normal. He exhibits tenderness.  Right lateral chest wall bruising with central healing abrasion. No SQ emphysema. Air movement to all fields with wheezing.    Abdominal: Soft. Bowel sounds are normal. There is no tenderness. There is no rebound and no guarding.  Abdominal exam is benign, nontender, specifically no RUQ tenderness.   Musculoskeletal: Normal range of motion.  Neurological: He is alert and oriented to person, place, and time.  Skin: Skin is warm and dry. No rash noted.  Psychiatric: He has a normal mood and affect.     ED Treatments / Results  Labs (all labs ordered are listed, but only abnormal results are displayed) Labs Reviewed - No data to display  EKG  EKG Interpretation None       Radiology Dg Chest 2 View  Result Date: 05/18/2017 CLINICAL DATA:  Shortness of breath EXAM: CHEST  2 VIEW COMPARISON:  03/08/2017 FINDINGS: Linear scarring within the right mid lung. Minimal atelectasis at the left base. No focal consolidation or pleural effusion. Normal cardiomediastinal silhouette. No pneumothorax. IMPRESSION: No pneumothorax or pleural effusion. Electronically Signed   By: Donavan Foil M.D.   On: 05/18/2017 23:49   Dg Ribs Unilateral Right  Result Date: 05/18/2017 CLINICAL DATA:  Fall with right-sided rib pain EXAM: RIGHT RIBS - 2 VIEW COMPARISON:  05/18/2017 FINDINGS: Old right fifth rib fracture. No acute displaced right rib fracture is seen. IMPRESSION: No acute displaced right rib fracture. Electronically Signed   By: Donavan Foil M.D.   On: 05/18/2017 23:50    Procedures Procedures (including  critical care time)  Medications Ordered in ED Medications  oxyCODONE-acetaminophen (PERCOCET/ROXICET) 5-325 MG per tablet 1 tablet (1 tablet Oral Given 05/19/17 0015)  albuterol (PROVENTIL) (2.5 MG/3ML) 0.083% nebulizer solution 5 mg (5 mg Nebulization Given 05/19/17 0015)  albuterol (PROVENTIL) (2.5 MG/3ML) 0.083% nebulizer solution 5 mg (5 mg Nebulization Given 05/19/17 0158)  ipratropium (ATROVENT) nebulizer solution 0.5 mg (0.5 mg Nebulization Given 05/19/17 0158)  oxyCODONE-acetaminophen (PERCOCET/ROXICET) 5-325 MG per tablet 1 tablet (1 tablet Oral Given 05/19/17 0158)     Initial Impression / Assessment and Plan / ED Course  I have reviewed the triage vital signs and the nursing notes.  Pertinent labs & imaging results that were available during my care of the patient were reviewed by me and considered in my medical decision making (see chart for details).     Patient here with right chest wall pain after fall 2 days ago. Imaging negative for PTX or fracture. Abdominal exam  benign.   He has a history of COPD and is wheezing bilaterally. Nebulizer treatment provided with some improvement. No hypoxia. Percocet for pain.   He is felt appropriate for discharge home. Will provide incentive spirometer, encourage use of inhaler. #8 Percocet.  PCP follow up prn.  Final Clinical Impressions(s) / ED Diagnoses   Final diagnoses:  Pain   1. Chest wall contusion 2. COPD  ED Discharge Orders    None       Charlann Lange, PA-C 05/19/17 Gordon, Delice Bison, DO 05/19/17 [phone]

## 2017-05-19 NOTE — ED Notes (Signed)
Pt discharged from ED; instructions provided and scripts given; Pt encouraged to return to ED if symptoms worsen and to f/u with PCP; Pt verbalized understanding of all instructions 

## 2017-06-09 ENCOUNTER — Encounter (HOSPITAL_COMMUNITY): Payer: Self-pay

## 2017-06-09 ENCOUNTER — Emergency Department (HOSPITAL_COMMUNITY)
Admission: EM | Admit: 2017-06-09 | Discharge: 2017-06-09 | Disposition: A | Discharge status: Home / Self Care | Payer: Medicaid Other | Attending: Emergency Medicine | Admitting: Emergency Medicine

## 2017-06-09 ENCOUNTER — Emergency Department (HOSPITAL_COMMUNITY): Payer: Medicaid Other

## 2017-06-09 DIAGNOSIS — I10 Essential (primary) hypertension: Secondary | ICD-10-CM | POA: Insufficient documentation

## 2017-06-09 DIAGNOSIS — M25511 Pain in right shoulder: Secondary | ICD-10-CM | POA: Diagnosis present

## 2017-06-09 DIAGNOSIS — Z79899 Other long term (current) drug therapy: Secondary | ICD-10-CM | POA: Insufficient documentation

## 2017-06-09 DIAGNOSIS — F17210 Nicotine dependence, cigarettes, uncomplicated: Secondary | ICD-10-CM | POA: Diagnosis not present

## 2017-06-09 DIAGNOSIS — J449 Chronic obstructive pulmonary disease, unspecified: Secondary | ICD-10-CM | POA: Diagnosis not present

## 2017-06-09 MED ORDER — OXYCODONE HCL 5 MG PO TABS: 2.5000 mg | ORAL_TABLET | ORAL | 0 refills | Status: DC | PRN

## 2017-06-09 MED ORDER — OXYCODONE-ACETAMINOPHEN 5-325 MG PO TABS
2.0000 | ORAL_TABLET | Freq: Once | ORAL | Status: AC
  Administered 2017-06-09: 2 via ORAL
  Filled 2017-06-09: qty 2

## 2017-06-09 MED ORDER — LIDOCAINE-EPINEPHRINE (PF) 2 %-1:200000 IJ SOLN
30.0000 mL | Freq: Once | INTRAMUSCULAR | Status: DC
  Filled 2017-06-09: qty 40

## 2017-06-09 NOTE — ED Provider Notes (Addendum)
[facility] EMERGENCY DEPARTMENT Provider Note   CSN: [id number] Arrival date & time: 06/09/17  1403     History   Chief Complaint No chief complaint on file.   HPI [name] is a 57 y.o. male is a past medical history of COPD and hypertension who presents the emergency department with chief complaint of right shoulder blade pain.  Patient states that he fell and hit his rib cage about 1 month ago.  Patient states that about that time he developed pain in his posterior shoulder girdle with worsening pain on that side over the past 4 weeks.  He states that he is unable to turn his neck or range the shoulder blade without severe pain.  It hurts to take a deep breath.  He denies hemoptysis.  He had a negative rib x-ray on that time.  He has been taking ibuprofen without relief of his symptoms.  Denies pain in the shoulder joint, weakness or numbness.  He still has some rib tenderness but no difficulty breathing.  HPI  Past Medical History:  Diagnosis Date  . COPD (chronic obstructive pulmonary disease) ([place])   . Hypertension     Patient Active Problem List   Diagnosis Date Noted  . Acute sinusitis, unspecified 03/15/2017  . Rupture of right distal biceps tendon 03/01/2016  . Pain in right lower leg 01/30/2016  . Chest pain 04/26/2015  . Hypokalemia 04/26/2015  . COPD GOLD 0 / active smoker 04/26/2015  . Pain in the chest   . Fall 07/22/2014  . Multiple fractures of ribs of right side 07/22/2014  . Pneumothorax on right 07/22/2014  . Traumatic pneumohemothorax 07/21/2014  . Altered mental state 04/15/2013  . Acute encephalopathy 04/15/2013  . Difficulty speaking 04/15/2013  . Headache(784.0) 04/15/2013  . Cigarette smoker 04/15/2013  . HTN (hypertension) 04/15/2013    Past Surgical History:  Procedure Laterality Date  . CARDIAC CATHETERIZATION    . HIP SURGERY    . KNEE SURGERY         Home Medications    Prior to Admission  medications   Medication Sig Start Date End Date Taking? Authorizing Provider  acetaminophen-codeine (TYLENOL #3) 300-30 MG tablet One every 4 hours as needed for cough Patient not taking: Reported on 05/19/2017 03/08/17   Tanda Rockers, MD  albuterol (PROVENTIL HFA;VENTOLIN HFA) 108 (90 Base) MCG/ACT inhaler Inhale 1-2 puffs into the lungs every 6 (six) hours as needed for wheezing or shortness of breath. 06/14/16   Shary Decamp, PA-C  amoxicillin-clavulanate (AUGMENTIN) 875-125 MG tablet Take 1 tablet by mouth 2 (two) times daily. Patient not taking: Reported on 05/19/2017 03/15/17   Tanda Rockers, MD  budesonide-formoterol [facility]) 80-4.5 MCG/ACT inhaler Inhale 2 puffs into the lungs 2 (two) times daily. Patient not taking: Reported on 05/19/2017 03/08/17   Tanda Rockers, MD  famotidine (PEPCID) 20 MG tablet One at bedtime Patient not taking: Reported on 05/19/2017 03/08/17   Tanda Rockers, MD  methylPREDNISolone (MEDROL DOSEPAK) 4 MG TBPK tablet 6 Day Taper Pack. Take as Directed. Patient not taking: Reported on 05/19/2017 03/22/17   [name], DPM  oxyCODONE (ROXICODONE) 5 MG immediate release tablet Take 0.5-1 tablets (2.5-5 mg total) by mouth every 4 (four) hours as needed for severe pain. 06/09/17   Margarita Mail, PA-C  oxyCODONE-acetaminophen (PERCOCET/ROXICET) 5-325 MG tablet Take 1-2 tablets by mouth every 4 (four) hours as needed for severe pain. 05/19/17   Charlann Lange, PA-C  pantoprazole (PROTONIX) 40 MG tablet Take 1 tablet (40 mg total) by mouth daily. Take 30-60 min before first meal of the day Patient not taking: Reported on 05/19/2017 03/08/17   Tanda Rockers, MD    Family History Family History  Problem Relation Age of Onset  . CAD Father   . Bone cancer Mother     Social History Social History   Tobacco Use  . Smoking status: Current Every Day Smoker    Packs/day: 1.50    Years: 28.00    Pack years: 42.00  . Smokeless tobacco: Never Used    Substance Use Topics  . Alcohol use: Yes    Comment: on occasion  . Drug use: No     Allergies   Patient has no known allergies.   Review of Systems Review of Systems  Ten systems reviewed and are negative for acute change, except as noted in the HPI.   Physical Exam Updated Vital Signs BP (!) 116/100   Pulse 74   Temp 98.2 F (36.8 C) (Oral)   Resp 16   SpO2 100%   Physical Exam  Constitutional: He appears well-developed and well-nourished. No distress.  HENT:  Head: Normocephalic and atraumatic.  Eyes: Conjunctivae are normal. No scleral icterus.  Neck: Normal range of motion. Neck supple.  Cardiovascular: Normal rate, regular rhythm and normal heart sounds.  Pulmonary/Chest: Effort normal and breath sounds normal. No respiratory distress.  Abdominal: Soft. There is no tenderness.  Musculoskeletal: He exhibits no edema.  Full range of motion of the right glenohumeral joint spastic tissue of the posterior shoulder girdle region.  Reproducible pain with palpation of multiple trigger points along the medial border of the scapula.  Neurological: He is alert.  Skin: Skin is warm and dry. He is not diaphoretic.  Psychiatric: His behavior is normal.  Nursing note and vitals reviewed.    ED Treatments / Results  Labs (all labs ordered are listed, but only abnormal results are displayed) Labs Reviewed - No data to display  EKG  EKG Interpretation None       Radiology Dg Shoulder Right  Result Date: 06/09/2017 CLINICAL DATA:  Right shoulder pain. EXAM: RIGHT SHOULDER - 2+ VIEW COMPARISON:  None. FINDINGS: Normal anatomic alignment. No evidence of for acute fracture or dislocation. Visualized right hemithorax is unremarkable. IMPRESSION: No acute osseous abnormality. Electronically Signed   By: Lovey Newcomer M.D.   On: 06/09/2017 15:33    Procedures Procedures (including critical care time)  Medications Ordered in ED Medications  lidocaine-EPINEPHrine  (XYLOCAINE W/EPI) 2 %-1:200000 (PF) injection 30 mL (not administered)  oxyCODONE-acetaminophen (PERCOCET/ROXICET) 5-325 MG per tablet 2 tablet (2 tablets Oral Given 06/09/17 1800)     Initial Impression / Assessment and Plan / ED Course  I have reviewed the triage vital signs and the nursing notes.  Pertinent labs & imaging results that were available during my care of the patient were reviewed by me and considered in my medical decision making (see chart for details).  Clinical Course as of Jun 09 1940  Sun Jun 09, 2017  1717 DG Ribs Unilateral W/Chest Right [AH]    Clinical Course User Index [AH] Margarita Mail, PA-C    Trigger Point Injection Date/Time:06/09/2017 7:48 PM Performed by: Margarita Mail Authorized by: Margarita Mail Consent: Verbal consent obtained. Risks and benefits: risks, benefits and alternatives were discussed Consent given by: patient Patient identity confirmed: provided patient demograhics Preparation: Patient was prepped and draped in the usual sterile fashion.  Local anesthesia used: yes Anesthesia: local infiltration Local anesthetic: Lidocaine 2% with epi Anesthetic total: 8 Patient tolerance: Patient tolerated the procedure well with no immediate complications   Patient with multiple trigger points of the shoulder girdle.  He has had significant improvement in range of motion and a decrease in his pain.  He is not pain-free however.  Patient has had significant difficulty sleeping and I will give him some pain medication.  He has a relationship with Dr.Xu at [facility] orthopedic advised patient follow-up there.  He may need physical therapy or further evaluation.  He appears appropriate for discharge at this point.  I discussed return precautions with the patient  Final Clinical Impressions(s) / ED Diagnoses   Final diagnoses:  Trigger point of shoulder region, right    ED Discharge Orders        Ordered    oxyCODONE (ROXICODONE) 5 MG immediate  release tablet  Every 4 hours PRN     06/09/17 McCook, Horace, PA-C 06/09/17 1949    Margarita Mail, PA-C 06/09/17 1952    Virgel Manifold, MD 06/09/17 2042

## 2017-06-09 NOTE — Discharge Instructions (Signed)
Contact a health care provider if: °Your pain gets worse. °Your pain is not relieved with medicines. °New pain develops in your arm, hand, or fingers. °Get help right away if: °Your arm, hand, or fingers: °Tingle. °Become numb. °Become swollen. °Become painful. °Turn white or blue. °

## 2017-06-09 NOTE — ED Triage Notes (Signed)
Pt can not rest on right side due to pain. Pain to ribs and shoulder bale on RT increases with movement and pressure.

## 2017-06-09 NOTE — ED Notes (Signed)
Declined W/C at D/C and was escorted to lobby by RN. 

## 2017-06-09 NOTE — ED Triage Notes (Signed)
Patient fell 3-4 weeks ago and having ongoing right shoulder and posterior rib pain. Had rib xrays that were negative, no SOB, pain with laying flat is the worse

## 2017-06-11 ENCOUNTER — Ambulatory Visit: Payer: Medicaid Other | Attending: Internal Medicine

## 2017-09-11 ENCOUNTER — Ambulatory Visit (INDEPENDENT_AMBULATORY_CARE_PROVIDER_SITE_OTHER): Payer: Self-pay | Admitting: Orthopaedic Surgery

## 2017-09-12 ENCOUNTER — Encounter (INDEPENDENT_AMBULATORY_CARE_PROVIDER_SITE_OTHER): Payer: Self-pay | Admitting: Physician Assistant

## 2017-09-12 ENCOUNTER — Ambulatory Visit (INDEPENDENT_AMBULATORY_CARE_PROVIDER_SITE_OTHER): Payer: Medicaid Other

## 2017-09-12 ENCOUNTER — Ambulatory Visit (INDEPENDENT_AMBULATORY_CARE_PROVIDER_SITE_OTHER): Payer: Medicaid Other | Admitting: Physician Assistant

## 2017-09-12 DIAGNOSIS — M79671 Pain in right foot: Secondary | ICD-10-CM

## 2017-09-12 MED ORDER — METHOCARBAMOL 500 MG PO TABS: 500.0000 mg | ORAL_TABLET | Freq: Three times a day (TID) | ORAL | 0 refills | Status: DC | PRN

## 2017-09-12 MED ORDER — OXYCODONE-ACETAMINOPHEN 5-325 MG PO TABS: 1.0000 | ORAL_TABLET | Freq: Three times a day (TID) | ORAL | 0 refills | Status: DC | PRN

## 2017-09-12 NOTE — Progress Notes (Signed)
Office Visit Note   Patient: [name]           Date of Birth: [birth year]           MRN: [id number] Visit Date: 09/12/2017              Requested by: [name], MD [address] [place], [place] [address] PCP: [name], MD   Assessment & Plan: Visit Diagnoses:  1. Pain of right heel     Plan: Impression is questionable partial tear right Achilles tendon.  At this point, we will obtain an MRI to further assess the Achilles tendon.  We will also obtain a venous Doppler ultrasound to rule out DVT.  He will follow-up with [place] once the MRI has been completed.  Call with concerns or questions in the meantime.  Follow-Up Instructions: Return in about 2 weeks (around 09/26/2017).   Orders:  Orders Placed This Encounter  Procedures  . XR Os Calcis Right   Meds ordered this encounter  Medications  . oxyCODONE-acetaminophen (PERCOCET/ROXICET) 5-325 MG tablet    Sig: Take 1 tablet by mouth every 8 (eight) hours as needed for severe pain.    Dispense:  15 tablet    Refill:  0  . methocarbamol (ROBAXIN) 500 MG tablet    Sig: Take 1 tablet (500 mg total) by mouth 3 (three) times daily as needed for muscle spasms.    Dispense:  30 tablet    Refill:  0      Procedures: No procedures performed   Clinical Data: No additional findings.   Subjective: Chief Complaint  Patient presents with  . Right Ankle - Pain    HEEL PAIN    HPI patient is a pleasant 57 year old gentleman who presents to our clinic today with right heel pain.  This began approximately 1 month ago without any known injury or change in activity.   he also notes calf pain with ambulation.  Pain is worse walking.  He is unable to get comfortable at this point in time.  He is unable to ambulate without the use of a crutch.  He does have a history of a left Achilles tear which was fixed by Dr. Sherrian Divers a few years back.  This was without injury.  He also has a history of right biceps  tendon tear.  No recent use of flora quinolone antibiotics or steroids.  Review of Systems as detailed in HPI.  All others reviewed and are negative.   Objective: Vital Signs: There were no vitals taken for this visit.  Physical Exam well-developed well-nourished gentleman no acute distress.  Alert and oriented x3.  Ortho Exam examination of his right heel reveals moderate tenderness and swelling throughout the entire distal half of the Achilles.  Limited dorsiflexion at the ankle secondary to pain.  Negative Thompson test.  Calf is soft and nontender.  Negative Homans.  Specialty Comments:  No specialty comments available.  Imaging: Xr Os Calcis Right  Result Date: 09/12/2017 Retrocalcaneal spur noted    PMFS History: Patient Active Problem List   Diagnosis Date Noted  . Acute sinusitis, unspecified 03/15/2017  . Rupture of right distal biceps tendon 03/01/2016  . Pain in right lower leg 01/30/2016  . Chest pain 04/26/2015  . Hypokalemia 04/26/2015  . COPD GOLD 0 / active smoker 04/26/2015  . Pain in the chest   . Fall 07/22/2014  . Multiple fractures of ribs of right side 07/22/2014  .  Pneumothorax on right 07/22/2014  . Traumatic pneumohemothorax 07/21/2014  . Altered mental state 04/15/2013  . Acute encephalopathy 04/15/2013  . Difficulty speaking 04/15/2013  . Headache(784.0) 04/15/2013  . Cigarette smoker 04/15/2013  . HTN (hypertension) 04/15/2013   Past Medical History:  Diagnosis Date  . COPD (chronic obstructive pulmonary disease) ([place])   . Hypertension     Family History  Problem Relation Age of Onset  . CAD Father   . Bone cancer Mother     Past Surgical History:  Procedure Laterality Date  . CARDIAC CATHETERIZATION    . HIP SURGERY    . KNEE SURGERY     Social History   Occupational History  . Not on file  Tobacco Use  . Smoking status: Current Every Day Smoker    Packs/day: 1.50    Years: 28.00    Pack years: 42.00  . Smokeless  tobacco: Never Used  Substance and Sexual Activity  . Alcohol use: Yes    Comment: on occasion  . Drug use: No  . Sexual activity: Not on file

## 2017-09-13 ENCOUNTER — Ambulatory Visit (HOSPITAL_COMMUNITY)
Admission: RE | Admit: 2017-09-13 | Discharge: 2017-09-13 | Disposition: A | Discharge status: Home / Self Care | Payer: Medicaid Other | Source: Ambulatory Visit | Attending: Cardiovascular Disease | Admitting: Cardiovascular Disease

## 2017-09-13 DIAGNOSIS — M79671 Pain in right foot: Secondary | ICD-10-CM | POA: Diagnosis present

## 2017-09-23 ENCOUNTER — Inpatient Hospital Stay: Admission: RE | Admit: 2017-09-23 | Payer: Medicaid Other | Source: Ambulatory Visit

## 2017-09-23 ENCOUNTER — Emergency Department (HOSPITAL_COMMUNITY)
Admission: EM | Admit: 2017-09-23 | Discharge: 2017-09-23 | Disposition: A | Discharge status: Home / Self Care | Payer: Medicaid Other | Attending: Emergency Medicine | Admitting: Emergency Medicine

## 2017-09-23 ENCOUNTER — Encounter (HOSPITAL_COMMUNITY): Payer: Self-pay | Admitting: *Deleted

## 2017-09-23 ENCOUNTER — Other Ambulatory Visit: Payer: Self-pay

## 2017-09-23 ENCOUNTER — Emergency Department (HOSPITAL_COMMUNITY): Payer: Medicaid Other

## 2017-09-23 DIAGNOSIS — W228XXA Striking against or struck by other objects, initial encounter: Secondary | ICD-10-CM | POA: Diagnosis not present

## 2017-09-23 DIAGNOSIS — Z79899 Other long term (current) drug therapy: Secondary | ICD-10-CM | POA: Insufficient documentation

## 2017-09-23 DIAGNOSIS — S81832A Puncture wound without foreign body, left lower leg, initial encounter: Secondary | ICD-10-CM | POA: Diagnosis present

## 2017-09-23 DIAGNOSIS — J449 Chronic obstructive pulmonary disease, unspecified: Secondary | ICD-10-CM | POA: Diagnosis not present

## 2017-09-23 DIAGNOSIS — Y9389 Activity, other specified: Secondary | ICD-10-CM | POA: Insufficient documentation

## 2017-09-23 DIAGNOSIS — Y929 Unspecified place or not applicable: Secondary | ICD-10-CM | POA: Insufficient documentation

## 2017-09-23 DIAGNOSIS — Y999 Unspecified external cause status: Secondary | ICD-10-CM | POA: Insufficient documentation

## 2017-09-23 DIAGNOSIS — I10 Essential (primary) hypertension: Secondary | ICD-10-CM | POA: Insufficient documentation

## 2017-09-23 DIAGNOSIS — F17200 Nicotine dependence, unspecified, uncomplicated: Secondary | ICD-10-CM | POA: Diagnosis not present

## 2017-09-23 MED ORDER — SULFAMETHOXAZOLE-TRIMETHOPRIM 800-160 MG PO TABS
1.0000 | ORAL_TABLET | Freq: Once | ORAL | Status: AC
  Administered 2017-09-23: 1 via ORAL
  Filled 2017-09-23: qty 1

## 2017-09-23 MED ORDER — SULFAMETHOXAZOLE-TRIMETHOPRIM 800-160 MG PO TABS: 1.0000 | ORAL_TABLET | Freq: Two times a day (BID) | ORAL | 0 refills | Status: AC

## 2017-09-23 MED ORDER — AMOXICILLIN-POT CLAVULANATE 875-125 MG PO TABS
1.0000 | ORAL_TABLET | Freq: Once | ORAL | Status: AC
Start: 2017-09-23 — End: 2017-09-23
  Administered 2017-09-23: 1 via ORAL
  Filled 2017-09-23: qty 1

## 2017-09-23 MED ORDER — OXYCODONE-ACETAMINOPHEN 5-325 MG PO TABS: 1.0000 | ORAL_TABLET | Freq: Four times a day (QID) | ORAL | 0 refills | Status: DC | PRN

## 2017-09-23 MED ORDER — AMOXICILLIN-POT CLAVULANATE 875-125 MG PO TABS: 1.0000 | ORAL_TABLET | Freq: Two times a day (BID) | ORAL | 0 refills | Status: AC

## 2017-09-23 MED ORDER — OXYCODONE-ACETAMINOPHEN 5-325 MG PO TABS
1.0000 | ORAL_TABLET | Freq: Once | ORAL | Status: AC
  Administered 2017-09-23: 1 via ORAL
  Filled 2017-09-23: qty 1

## 2017-09-23 NOTE — ED Notes (Signed)
See EDP assessment 

## 2017-09-23 NOTE — ED Provider Notes (Signed)
[facility] EMERGENCY DEPARTMENT Provider Note   CSN: [id number] Arrival date & time: 09/23/17  1255     History   Chief Complaint Chief Complaint  Patient presents with  . Leg Pain    HPI [name] is a 57 y.o. male.  HPI  [name] is a 57yo male with a history of hypertension, COPD and tobacco use who presents to the emergency department for evaluation of wood splinter.  Patient reports that yesterday morning his left calf accidentally pierced a piece of plywood that was sticking off the side of his truck.  He states that he removed the splinter, although believes he only removed a portion of it. The part he was able to extract was about 1.5in long. He has pain below the puncture mark on his skin. States that pain is 9/10 in severity, throbbing and constant.  It is worsened with palpation or laying on the left side.  He tried taking some ibuprofen which did not help.  States that he noticed that it became somewhat swollen overnight.  He denies fevers, chills, numbness, weakness, pain elsewhere.  He is able to ambulate independently.  He denies history of HIV, diabetes or immunocompromise.  Reports his last tetanus vaccine was 2 years ago.  Past Medical History:  Diagnosis Date  . COPD (chronic obstructive pulmonary disease) ([place])   . Hypertension     Patient Active Problem List   Diagnosis Date Noted  . Acute sinusitis, unspecified 03/15/2017  . Rupture of right distal biceps tendon 03/01/2016  . Pain in right lower leg 01/30/2016  . Chest pain 04/26/2015  . Hypokalemia 04/26/2015  . COPD GOLD 0 / active smoker 04/26/2015  . Pain in the chest   . Fall 07/22/2014  . Multiple fractures of ribs of right side 07/22/2014  . Pneumothorax on right 07/22/2014  . Traumatic pneumohemothorax 07/21/2014  . Altered mental state 04/15/2013  . Acute encephalopathy 04/15/2013  . Difficulty speaking 04/15/2013  . Headache(784.0) 04/15/2013  .  Cigarette smoker 04/15/2013  . HTN (hypertension) 04/15/2013    Past Surgical History:  Procedure Laterality Date  . CARDIAC CATHETERIZATION    . HIP SURGERY    . KNEE SURGERY          Home Medications    Prior to Admission medications   Medication Sig Start Date End Date Taking? Authorizing Provider  acetaminophen-codeine (TYLENOL #3) 300-30 MG tablet One every 4 hours as needed for cough Patient not taking: Reported on 05/19/2017 03/08/17   Tanda Rockers, MD  albuterol (PROVENTIL HFA;VENTOLIN HFA) 108 (90 Base) MCG/ACT inhaler Inhale 1-2 puffs into the lungs every 6 (six) hours as needed for wheezing or shortness of breath. 06/14/16   Shary Decamp, PA-C  amoxicillin-clavulanate (AUGMENTIN) 875-125 MG tablet Take 1 tablet by mouth 2 (two) times daily. Patient not taking: Reported on 05/19/2017 03/15/17   Tanda Rockers, MD  budesonide-formoterol [facility]) 80-4.5 MCG/ACT inhaler Inhale 2 puffs into the lungs 2 (two) times daily. Patient not taking: Reported on 05/19/2017 03/08/17   Tanda Rockers, MD  famotidine (PEPCID) 20 MG tablet One at bedtime Patient not taking: Reported on 05/19/2017 03/08/17   Tanda Rockers, MD  methocarbamol (ROBAXIN) 500 MG tablet Take 1 tablet (500 mg total) by mouth 3 (three) times daily as needed for muscle spasms. 09/12/17   Aundra Dubin, PA-C  methylPREDNISolone (MEDROL DOSEPAK) 4 MG TBPK tablet 6 Day Taper Pack. Take as Directed. Patient not taking: Reported  on 05/19/2017 03/22/17   [name], DPM  oxyCODONE (ROXICODONE) 5 MG immediate release tablet Take 0.5-1 tablets (2.5-5 mg total) by mouth every 4 (four) hours as needed for severe pain. 06/09/17   Margarita Mail, PA-C  oxyCODONE-acetaminophen (PERCOCET/ROXICET) 5-325 MG tablet Take 1 tablet by mouth every 8 (eight) hours as needed for severe pain. 09/12/17   Aundra Dubin, PA-C  pantoprazole (PROTONIX) 40 MG tablet Take 1 tablet (40 mg total) by mouth daily. Take 30-60 min before  first meal of the day Patient not taking: Reported on 05/19/2017 03/08/17   Tanda Rockers, MD    Family History Family History  Problem Relation Age of Onset  . CAD Father   . Bone cancer Mother     Social History Social History   Tobacco Use  . Smoking status: Current Every Day Smoker    Packs/day: 1.50    Years: 28.00    Pack years: 42.00  . Smokeless tobacco: Never Used  Substance Use Topics  . Alcohol use: Yes    Comment: on occasion  . Drug use: No     Allergies   Patient has no known allergies.   Review of Systems Review of Systems  Constitutional: Negative for chills and fever.  Musculoskeletal: Positive for myalgias (left calf). Negative for arthralgias and gait problem.  Skin: Positive for color change (erythema) and wound (puncture mark left calf).  Neurological: Negative for weakness and numbness.     Physical Exam Updated Vital Signs BP 140/78 (BP Location: Right Arm)   Pulse 78   Temp 98.6 F (37 C) (Oral)   Resp 20   Ht 6\' 2"  (1.88 m)   Wt 113.4 kg (250 lb)   SpO2 99%   BMI 32.10 kg/m   Physical Exam  Constitutional: He appears well-developed and well-nourished. No distress.  No acute distress, nontoxic-appearing.  HENT:  Head: Normocephalic and atraumatic.  Eyes: Right eye exhibits no discharge. Left eye exhibits no discharge.  Pulmonary/Chest: Effort normal. No respiratory distress.  Musculoskeletal:  Left lateral calf with 0.5 cm puncture mark, bleeding controlled. Approximately 2cm area of surrounding erythema and swelling. Tender to palpation. No palpable foreign body. Full ROM of left knee and ankle without pain. DP pulses 2+ bilaterally and distal sensation to light touch intact in LLE.   Neurological: He is alert. Coordination normal.  Skin: Skin is warm and dry. Capillary refill takes less than 2 seconds. He is not diaphoretic.  Psychiatric: He has a normal mood and affect. His behavior is normal.  Nursing note and vitals  reviewed.    ED Treatments / Results  Labs (all labs ordered are listed, but only abnormal results are displayed) Labs Reviewed - No data to display  EKG None  Radiology No results found.  Procedures Procedures (including critical care time)  EMERGENCY DEPARTMENT US SOFT TISSUE INTERPRETATION "Study: Limited Soft Tissue Ultrasound"  INDICATIONS: Pain and Soft tissue infection Multiple views of the body part were obtained in real-time with a multi-frequency linear probe  PERFORMED BY: Myself IMAGES ARCHIVED?: Yes SIDE:Left BODY PART:Lower extremity INTERPRETATION:  No abcess noted, No cellulitis noted and no foreign body   Medications Ordered in ED Medications  oxyCODONE-acetaminophen (PERCOCET/ROXICET) 5-325 MG per tablet 1 tablet (1 tablet Oral Given 09/23/17 1501)  amoxicillin-clavulanate (AUGMENTIN) 875-125 MG per tablet 1 tablet (1 tablet Oral Given 09/23/17 1542)  sulfamethoxazole-trimethoprim (BACTRIM DS,SEPTRA DS) 800-160 MG per tablet 1 tablet (1 tablet Oral Given 09/23/17 1542)  Initial Impression / Assessment and Plan / ED Course  I have reviewed the triage vital signs and the nursing notes.  Pertinent labs & imaging results that were available during my care of the patient were reviewed by me and considered in my medical decision making (see chart for details).     Patient presents with left lower leg swelling and pain after extracting a splinter yesterday. He believes that a portion of the splinter is still stuck in the leg. On exam he is afebrile and non-toxic appearing. Left calf with small puncture mark and mild surrounding erythema and swelling. No palpable foreign body. No foreign body or cellulitis seen with bedside soft tissue US. Left tib/fib xray without foreign body. Dr. Johnney Killian also saw this patient and performed bedside soft tissue US herself and did not see foreign body.   Plan to start patient on broad spectrum antibiotics for potentially  contaminated splinter. His tetanus shot was updated. At this point, given no foreign body on imaging and no foreign body palpated on exam feel that incision to explore the area would do more harm than good. Discussed this with patient who agrees. Will have patient follow up with orthopedics in two days for recheck. Counseled him on strict return precautions.  This was a shared visit with Dr. Johnney Killian who also saw the patient and agrees with plan and discharge.   Final Clinical Impressions(s) / ED Diagnoses   Final diagnoses:  Puncture wound of left lower extremity excluding thigh, initial encounter    ED Discharge Orders        Ordered    amoxicillin-clavulanate (AUGMENTIN) 875-125 MG tablet  Every 12 hours     09/23/17 1533    sulfamethoxazole-trimethoprim (BACTRIM DS,SEPTRA DS) 800-160 MG tablet  2 times daily     09/23/17 1533    oxyCODONE-acetaminophen (PERCOCET/ROXICET) 5-325 MG tablet  Every 6 hours PRN     09/23/17 1535       Glyn Ade, PA-C 09/25/17 1414    Tegeler, Gwenyth Allegra, MD 09/25/17 1504

## 2017-09-23 NOTE — Discharge Instructions (Addendum)
Xray and ultrasound did not show a splinter.   Please take both antibiotics as prescribed for the next week.  Follow-up with the orthopedic doctor in 2 days (information to Dr. Alvan Dame listed below).   Return to the ER if you have any new or concerning symptoms like fever, worsening redness and swelling over the leg.

## 2017-09-23 NOTE — ED Notes (Signed)
Pt verbalizes understanding of d/c instructions. Pt received prescriptions. Pt taken to lobby in wheelchair at d/c with all belongings. Friend picking up pt.

## 2017-09-23 NOTE — ED Triage Notes (Signed)
PT states he ran into a piece of wood yesterday, and he feels there is a piece of wood still lodged inside his L calf.  Area swollen with some redness.

## 2017-09-23 NOTE — ED Provider Notes (Signed)
Medical screening examination/treatment/procedure(s) were conducted as a shared visit with non-physician practitioner(s) and myself.  I personally evaluated the patient during the encounter.  None Patient got a piece of wood stuck in his leg yesterday.  He pulled it out but he thinks the point broke off.  Patient has tenderness and erythema around a small less than 5 mm eschar.  Not able to palpate any tissue regularity that suggest subcutaneous foreign body.  I Have used ultrasound to examine the soft tissue and could not identify hyperdense lucency suggestive of foreign body.  I agree with plan of management.   Charlesetta Shanks, MD 09/23/17 1531

## 2017-09-28 ENCOUNTER — Other Ambulatory Visit: Payer: Medicaid Other

## 2017-10-09 ENCOUNTER — Encounter (INDEPENDENT_AMBULATORY_CARE_PROVIDER_SITE_OTHER): Payer: Self-pay | Admitting: Orthopaedic Surgery

## 2017-10-09 ENCOUNTER — Ambulatory Visit (INDEPENDENT_AMBULATORY_CARE_PROVIDER_SITE_OTHER): Payer: Medicaid Other | Admitting: Orthopaedic Surgery

## 2017-10-09 DIAGNOSIS — M25571 Pain in right ankle and joints of right foot: Secondary | ICD-10-CM

## 2017-10-09 MED ORDER — PREDNISONE 10 MG (21) PO TBPK: ORAL_TABLET | ORAL | 0 refills | Status: DC

## 2017-10-09 MED ORDER — MELOXICAM 7.5 MG PO TABS: 15.0000 mg | ORAL_TABLET | Freq: Every day | ORAL | 2 refills | Status: DC | PRN

## 2017-10-09 NOTE — Progress Notes (Signed)
Office Visit Note   Patient: [name]           Date of Birth: Nov 24, 1960           MRN: [id number] Visit Date: 10/09/2017              Requested by: [name], MD [id number] [place], [place] [address] PCP: [name], MD   Assessment & Plan: Visit Diagnoses:  1. Pain in right ankle and joints of right foot     Plan: Impression is continued right Achilles tendinosis.  Recommend MRI to evaluate for structural abnormalities.  For prednisone taper meloxicam.  Cam boot for ambulation.  Follow-up to review the MRI.  Follow-Up Instructions: Return in about 10 days (around 10/19/2017).   Orders:  Orders Placed This Encounter  Procedures  . MR Ankle Right w/o contrast   Meds ordered this encounter  Medications  . predniSONE (STERAPRED UNI-PAK 21 TAB) 10 MG (21) TBPK tablet    Sig: Take as directed    Dispense:  21 tablet    Refill:  0  . meloxicam (MOBIC) 7.5 MG tablet    Sig: Take 2 tablets (15 mg total) by mouth daily as needed for pain. Take after completing prednisone pak    Dispense:  30 tablet    Refill:  2      Procedures: No procedures performed   Clinical Data: No additional findings.   Subjective: Chief Complaint  Patient presents with  . Right Foot - Pain    Patient comes in today for continued severe pain in his Achilles tendon.  He was previously evaluated for this and he continues to have severe pain.  He is limping and has difficulty with ambulation.   Review of Systems   Objective: Vital Signs: There were no vitals taken for this visit.  Physical Exam  Ortho Exam Right ankle exam shows a normal Thompson test.  She does have exquisite tenderness along the Achilles tendon. Specialty Comments:  No specialty comments available.  Imaging: No results found.   PMFS History: Patient Active Problem List   Diagnosis Date Noted  . Acute sinusitis, unspecified 03/15/2017  . Rupture of right distal  biceps tendon 03/01/2016  . Pain in right lower leg 01/30/2016  . Chest pain 04/26/2015  . Hypokalemia 04/26/2015  . COPD GOLD 0 / active smoker 04/26/2015  . Pain in the chest   . Fall 07/22/2014  . Multiple fractures of ribs of right side 07/22/2014  . Pneumothorax on right 07/22/2014  . Traumatic pneumohemothorax 07/21/2014  . Altered mental state 04/15/2013  . Acute encephalopathy 04/15/2013  . Difficulty speaking 04/15/2013  . Headache(784.0) 04/15/2013  . Cigarette smoker 04/15/2013  . HTN (hypertension) 04/15/2013   Past Medical History:  Diagnosis Date  . COPD (chronic obstructive pulmonary disease) ([place])   . Hypertension     Family History  Problem Relation Age of Onset  . CAD Father   . Bone cancer Mother     Past Surgical History:  Procedure Laterality Date  . CARDIAC CATHETERIZATION    . HIP SURGERY    . KNEE SURGERY     Social History   Occupational History  . Not on file  Tobacco Use  . Smoking status: Current Every Day Smoker    Packs/day: 1.50    Years: 28.00    Pack years: 42.00  . Smokeless tobacco: Never Used  Substance and Sexual Activity  . Alcohol use:  Yes    Comment: on occasion  . Drug use: No  . Sexual activity: Not on file

## 2017-10-13 ENCOUNTER — Inpatient Hospital Stay: Admission: RE | Admit: 2017-10-13 | Payer: Medicaid Other | Source: Ambulatory Visit

## 2017-10-15 ENCOUNTER — Encounter (INDEPENDENT_AMBULATORY_CARE_PROVIDER_SITE_OTHER): Payer: Self-pay | Admitting: Radiology

## 2017-10-17 ENCOUNTER — Inpatient Hospital Stay: Admission: RE | Admit: 2017-10-17 | Payer: Medicaid Other | Source: Ambulatory Visit

## 2017-10-18 ENCOUNTER — Ambulatory Visit (INDEPENDENT_AMBULATORY_CARE_PROVIDER_SITE_OTHER): Payer: Self-pay | Admitting: Orthopaedic Surgery

## 2017-12-20 ENCOUNTER — Inpatient Hospital Stay (HOSPITAL_COMMUNITY)
Admission: EM | Admit: 2017-12-20 | Discharge: 2017-12-22 | DRG: 192 | Disposition: A | Discharge status: Home / Self Care | Payer: Medicaid Other | Attending: Internal Medicine | Admitting: Internal Medicine

## 2017-12-20 ENCOUNTER — Other Ambulatory Visit: Payer: Self-pay

## 2017-12-20 ENCOUNTER — Emergency Department (HOSPITAL_COMMUNITY): Payer: Medicaid Other

## 2017-12-20 ENCOUNTER — Encounter (HOSPITAL_COMMUNITY): Payer: Self-pay | Admitting: *Deleted

## 2017-12-20 DIAGNOSIS — J209 Acute bronchitis, unspecified: Secondary | ICD-10-CM | POA: Diagnosis present

## 2017-12-20 DIAGNOSIS — R0602 Shortness of breath: Secondary | ICD-10-CM | POA: Diagnosis present

## 2017-12-20 DIAGNOSIS — I10 Essential (primary) hypertension: Secondary | ICD-10-CM | POA: Diagnosis present

## 2017-12-20 DIAGNOSIS — J440 Chronic obstructive pulmonary disease with acute lower respiratory infection: Secondary | ICD-10-CM | POA: Diagnosis present

## 2017-12-20 DIAGNOSIS — Z23 Encounter for immunization: Secondary | ICD-10-CM

## 2017-12-20 DIAGNOSIS — F17210 Nicotine dependence, cigarettes, uncomplicated: Secondary | ICD-10-CM

## 2017-12-20 DIAGNOSIS — R0603 Acute respiratory distress: Secondary | ICD-10-CM | POA: Diagnosis not present

## 2017-12-20 DIAGNOSIS — Z9119 Patient's noncompliance with other medical treatment and regimen: Secondary | ICD-10-CM

## 2017-12-20 DIAGNOSIS — Z91199 Patient's noncompliance with other medical treatment and regimen due to unspecified reason: Secondary | ICD-10-CM

## 2017-12-20 DIAGNOSIS — J449 Chronic obstructive pulmonary disease, unspecified: Secondary | ICD-10-CM | POA: Diagnosis present

## 2017-12-20 DIAGNOSIS — J441 Chronic obstructive pulmonary disease with (acute) exacerbation: Secondary | ICD-10-CM | POA: Diagnosis present

## 2017-12-20 LAB — CBC WITH DIFFERENTIAL/PLATELET
Abs Immature Granulocytes: 0 10*3/uL (ref 0.0–0.1)
Basophils Absolute: 0 10*3/uL (ref 0.0–0.1)
Basophils Relative: 1 %
Eosinophils Absolute: 0.2 10*3/uL (ref 0.0–0.7)
Eosinophils Relative: 4 %
HCT: 49 % (ref 39.0–52.0)
Hemoglobin: 15.6 g/dL (ref 13.0–17.0)
Immature Granulocytes: 0 %
Lymphocytes Relative: 49 %
Lymphs Abs: 2.2 10*3/uL (ref 0.7–4.0)
MCH: 29.4 pg (ref 26.0–34.0)
MCHC: 31.8 g/dL (ref 30.0–36.0)
MCV: 92.3 fL (ref 78.0–100.0)
Monocytes Absolute: 0.6 10*3/uL (ref 0.1–1.0)
Monocytes Relative: 12 %
Neutro Abs: 1.5 10*3/uL — ABNORMAL LOW (ref 1.7–7.7)
Neutrophils Relative %: 34 %
Platelets: 210 10*3/uL (ref 150–400)
RBC: 5.31 MIL/uL (ref 4.22–5.81)
RDW: 13.7 % (ref 11.5–15.5)
WBC: 4.5 10*3/uL (ref 4.0–10.5)

## 2017-12-20 LAB — CBC
HCT: 46.1 % (ref 39.0–52.0)
HEMOGLOBIN: 14.6 g/dL (ref 13.0–17.0)
MCH: 29.5 pg (ref 26.0–34.0)
MCHC: 31.7 g/dL (ref 30.0–36.0)
MCV: 93.1 fL (ref 78.0–100.0)
Platelets: 194 10*3/uL (ref 150–400)
RBC: 4.95 MIL/uL (ref 4.22–5.81)
RDW: 13.9 % (ref 11.5–15.5)
WBC: 6.6 10*3/uL (ref 4.0–10.5)

## 2017-12-20 LAB — COMPREHENSIVE METABOLIC PANEL
ALT: 21 U/L (ref 0–44)
AST: 37 U/L (ref 15–41)
Albumin: 3.9 g/dL (ref 3.5–5.0)
Alkaline Phosphatase: 52 U/L (ref 38–126)
Anion gap: 11 (ref 5–15)
BUN: 7 mg/dL (ref 6–20)
CO2: 25 mmol/L (ref 22–32)
Calcium: 9.1 mg/dL (ref 8.9–10.3)
Chloride: 105 mmol/L (ref 98–111)
Creatinine, Ser: 1.01 mg/dL (ref 0.61–1.24)
GFR calc Af Amer: >60 mL/min (ref >60)
GFR calc non Af Amer: >60 mL/min (ref >60)
Glucose, Bld: 152 mg/dL — ABNORMAL HIGH (ref 70–99)
Potassium: 3.7 mmol/L (ref 3.5–5.1)
Sodium: 141 mmol/L (ref 135–145)
Total Bilirubin: 0.8 mg/dL (ref 0.3–1.2)
Total Protein: 6.9 g/dL (ref 6.5–8.1)

## 2017-12-20 LAB — CREATININE, SERUM
Creatinine, Ser: 1.06 mg/dL (ref 0.61–1.24)
GFR calc Af Amer: >60 mL/min (ref >60)
GFR calc non Af Amer: >60 mL/min (ref >60)

## 2017-12-20 LAB — MRSA PCR SCREENING: MRSA by PCR: NEGATIVE

## 2017-12-20 LAB — I-STAT TROPONIN, ED: Troponin i, poc: 0 ng/mL (ref 0.00–0.08)

## 2017-12-20 LAB — GLUCOSE, CAPILLARY: GLUCOSE-CAPILLARY: 337 mg/dL — AB (ref 70–99)

## 2017-12-20 MED ORDER — SODIUM CHLORIDE 0.9 % IV BOLUS
1000.0000 mL | Freq: Once | INTRAVENOUS | Status: AC
  Administered 2017-12-20: 1000 mL via INTRAVENOUS

## 2017-12-20 MED ORDER — ENOXAPARIN SODIUM 60 MG/0.6ML ~~LOC~~ SOLN
60.0000 mg | SUBCUTANEOUS | Status: DC
  Administered 2017-12-20 – 2017-12-21 (×2): 60 mg via SUBCUTANEOUS
  Filled 2017-12-20 (×2): qty 0.6

## 2017-12-20 MED ORDER — AZITHROMYCIN 250 MG PO TABS
500.0000 mg | ORAL_TABLET | Freq: Every day | ORAL | Status: DC
  Administered 2017-12-21 – 2017-12-22 (×2): 500 mg via ORAL
  Filled 2017-12-20 (×2): qty 2

## 2017-12-20 MED ORDER — INFLUENZA VAC SPLIT QUAD 0.5 ML IM SUSY
0.5000 mL | PREFILLED_SYRINGE | INTRAMUSCULAR | Status: AC
  Administered 2017-12-21: 0.5 mL via INTRAMUSCULAR
  Filled 2017-12-20: qty 0.5

## 2017-12-20 MED ORDER — SODIUM CHLORIDE 0.9 % IV SOLN
INTRAVENOUS | Status: DC
  Administered 2017-12-22: 05:00:00 via INTRAVENOUS

## 2017-12-20 MED ORDER — ALBUTEROL SULFATE (2.5 MG/3ML) 0.083% IN NEBU
5.0000 mg | INHALATION_SOLUTION | Freq: Once | RESPIRATORY_TRACT | Status: AC
  Administered 2017-12-20: 5 mg via RESPIRATORY_TRACT
  Filled 2017-12-20: qty 6

## 2017-12-20 MED ORDER — MAGNESIUM SULFATE 2 GM/50ML IV SOLN
2.0000 g | Freq: Once | INTRAVENOUS | Status: AC
  Administered 2017-12-20: 2 g via INTRAVENOUS
  Filled 2017-12-20: qty 50

## 2017-12-20 MED ORDER — ACETAMINOPHEN 500 MG PO TABS
1000.0000 mg | ORAL_TABLET | Freq: Once | ORAL | Status: AC
  Administered 2017-12-20: 1000 mg via ORAL
  Filled 2017-12-20: qty 2

## 2017-12-20 MED ORDER — ENOXAPARIN SODIUM 40 MG/0.4ML ~~LOC~~ SOLN: 40.0000 mg | SUBCUTANEOUS | Status: DC

## 2017-12-20 MED ORDER — HYDROCODONE-ACETAMINOPHEN 5-325 MG PO TABS
1.0000 | ORAL_TABLET | ORAL | Status: DC | PRN
  Administered 2017-12-20 – 2017-12-22 (×6): 2 via ORAL
  Filled 2017-12-20 (×6): qty 2

## 2017-12-20 MED ORDER — ALBUTEROL (5 MG/ML) CONTINUOUS INHALATION SOLN
15.0000 mg/h | INHALATION_SOLUTION | Freq: Once | RESPIRATORY_TRACT | Status: AC
  Administered 2017-12-20: 15 mg/h via RESPIRATORY_TRACT
  Filled 2017-12-20: qty 20

## 2017-12-20 MED ORDER — INSULIN ASPART 100 UNIT/ML ~~LOC~~ SOLN
0.0000 [IU] | Freq: Three times a day (TID) | SUBCUTANEOUS | Status: DC
  Administered 2017-12-21: 5 [IU] via SUBCUTANEOUS

## 2017-12-20 MED ORDER — METHYLPREDNISOLONE SODIUM SUCC 40 MG IJ SOLR
40.0000 mg | Freq: Four times a day (QID) | INTRAMUSCULAR | Status: DC
  Administered 2017-12-20 – 2017-12-21 (×2): 40 mg via INTRAVENOUS
  Filled 2017-12-20 (×2): qty 1

## 2017-12-20 MED ORDER — ALBUTEROL (5 MG/ML) CONTINUOUS INHALATION SOLN
7.5000 mg/h | INHALATION_SOLUTION | Freq: Once | RESPIRATORY_TRACT | Status: AC
  Administered 2017-12-20: 7.5 mg/h via RESPIRATORY_TRACT

## 2017-12-20 MED ORDER — METHYLPREDNISOLONE SODIUM SUCC 125 MG IJ SOLR
125.0000 mg | Freq: Once | INTRAMUSCULAR | Status: AC
  Administered 2017-12-20: 125 mg via INTRAVENOUS
  Filled 2017-12-20: qty 2

## 2017-12-20 MED ORDER — ALBUTEROL SULFATE (2.5 MG/3ML) 0.083% IN NEBU: 2.5000 mg | INHALATION_SOLUTION | Freq: Four times a day (QID) | RESPIRATORY_TRACT | Status: DC

## 2017-12-20 MED ORDER — ONDANSETRON HCL 4 MG PO TABS: 4.0000 mg | ORAL_TABLET | Freq: Four times a day (QID) | ORAL | Status: DC | PRN

## 2017-12-20 MED ORDER — IPRATROPIUM-ALBUTEROL 0.5-2.5 (3) MG/3ML IN SOLN
3.0000 mL | Freq: Four times a day (QID) | RESPIRATORY_TRACT | Status: DC
  Administered 2017-12-20 – 2017-12-21 (×5): 3 mL via RESPIRATORY_TRACT
  Filled 2017-12-20 (×6): qty 3

## 2017-12-20 MED ORDER — ACETAMINOPHEN 650 MG RE SUPP: 650.0000 mg | Freq: Four times a day (QID) | RECTAL | Status: DC | PRN

## 2017-12-20 MED ORDER — BUDESONIDE 0.5 MG/2ML IN SUSP
0.5000 mg | Freq: Two times a day (BID) | RESPIRATORY_TRACT | Status: DC
  Administered 2017-12-20 – 2017-12-22 (×4): 0.5 mg via RESPIRATORY_TRACT
  Filled 2017-12-20 (×4): qty 2

## 2017-12-20 MED ORDER — CYCLOBENZAPRINE HCL 10 MG PO TABS
10.0000 mg | ORAL_TABLET | Freq: Once | ORAL | Status: AC
  Administered 2017-12-20: 10 mg via ORAL
  Filled 2017-12-20: qty 1

## 2017-12-20 MED ORDER — ONDANSETRON HCL 4 MG/2ML IJ SOLN: 4.0000 mg | Freq: Four times a day (QID) | INTRAMUSCULAR | Status: DC | PRN

## 2017-12-20 MED ORDER — ALBUTEROL (5 MG/ML) CONTINUOUS INHALATION SOLN
10.0000 mg/h | INHALATION_SOLUTION | Freq: Once | RESPIRATORY_TRACT | Status: AC
  Administered 2017-12-20: 10 mg/h via RESPIRATORY_TRACT
  Filled 2017-12-20: qty 20

## 2017-12-20 MED ORDER — SENNOSIDES-DOCUSATE SODIUM 8.6-50 MG PO TABS: 1.0000 | ORAL_TABLET | Freq: Every evening | ORAL | Status: DC | PRN

## 2017-12-20 MED ORDER — BISACODYL 5 MG PO TBEC: 5.0000 mg | DELAYED_RELEASE_TABLET | Freq: Every day | ORAL | Status: DC | PRN

## 2017-12-20 MED ORDER — ACETAMINOPHEN 325 MG PO TABS: 650.0000 mg | ORAL_TABLET | Freq: Four times a day (QID) | ORAL | Status: DC | PRN

## 2017-12-20 MED ORDER — IPRATROPIUM BROMIDE 0.02 % IN SOLN
1.5000 mg | Freq: Once | RESPIRATORY_TRACT | Status: AC
  Administered 2017-12-20: 1.5 mg via RESPIRATORY_TRACT
  Filled 2017-12-20: qty 7.5

## 2017-12-20 NOTE — ED Provider Notes (Signed)
[facility] EMERGENCY DEPARTMENT Provider Note   CSN: [id number] Arrival date & time: 12/20/17  1103     History   Chief Complaint Chief Complaint  Patient presents with  . Cough    HPI [name] is a 57 y.o. male.  HPI   57 year old male with a history of hypertension, COPD presents with concern for cough and shortness of breath.  Patient reports that he was spray painting a house, when he began developed cough and shortness of breath.  Reports he feels like he is wheezing.  He had continued to work on the house over the last 3 days as symptoms worsen.  Reports a dull substernal chest pain without radiation.  Denies leg pain or swelling, history of DVT or PE, long trip in a car airplane.  Recent surgeries.  Reports that the cough is nonproductive.  He ran out of his inhaler.  Reports significant fatigue.  Denies nasal congestion or fevers.  Was admitted to COPD a long time ago, no history of intubation for COPD.  Past Medical History:  Diagnosis Date  . COPD (chronic obstructive pulmonary disease) ([place])   . Hypertension     Patient Active Problem List   Diagnosis Date Noted  . Acute respiratory distress 12/20/2017  . History of noncompliance with medical treatment 12/20/2017  . Acute sinusitis, unspecified 03/15/2017  . Rupture of right distal biceps tendon 03/01/2016  . Pain in right lower leg 01/30/2016  . Chest pain 04/26/2015  . Hypokalemia 04/26/2015  . COPD GOLD 0 / active smoker 04/26/2015  . Pain in the chest   . Fall 07/22/2014  . Multiple fractures of ribs of right side 07/22/2014  . Pneumothorax on right 07/22/2014  . Traumatic pneumohemothorax 07/21/2014  . Altered mental state 04/15/2013  . Acute encephalopathy 04/15/2013  . Difficulty speaking 04/15/2013  . Headache(784.0) 04/15/2013  . Cigarette smoker 04/15/2013  . HTN (hypertension) 04/15/2013    Past Surgical History:  Procedure Laterality Date  . CARDIAC  CATHETERIZATION    . HIP SURGERY    . KNEE SURGERY          Home Medications    Prior to Admission medications   Medication Sig Start Date End Date Taking? Authorizing Provider  albuterol (PROVENTIL HFA;VENTOLIN HFA) 108 (90 Base) MCG/ACT inhaler Inhale 1-2 puffs into the lungs every 6 (six) hours as needed for wheezing or shortness of breath. 06/14/16  Yes Shary Decamp, PA-C  acetaminophen-codeine (TYLENOL #3) 300-30 MG tablet One every 4 hours as needed for cough Patient not taking: Reported on 05/19/2017 03/08/17   Tanda Rockers, MD  budesonide-formoterol (SYMBICORT) 80-4.5 MCG/ACT inhaler Inhale 2 puffs into the lungs 2 (two) times daily. Patient not taking: Reported on 05/19/2017 03/08/17   Tanda Rockers, MD  famotidine (PEPCID) 20 MG tablet One at bedtime Patient not taking: Reported on 05/19/2017 03/08/17   Tanda Rockers, MD  meloxicam (MOBIC) 7.5 MG tablet Take 2 tablets (15 mg total) by mouth daily as needed for pain. Take after completing prednisone pak Patient not taking: Reported on 12/20/2017 10/09/17   Leandrew Koyanagi, MD  methocarbamol (ROBAXIN) 500 MG tablet Take 1 tablet (500 mg total) by mouth 3 (three) times daily as needed for muscle spasms. Patient not taking: Reported on 12/20/2017 09/12/17   Aundra Dubin, PA-C  oxyCODONE (ROXICODONE) 5 MG immediate release tablet Take 0.5-1 tablets (2.5-5 mg total) by mouth every 4 (four) hours as needed for severe pain. Patient  not taking: Reported on 12/20/2017 06/09/17   Margarita Mail, PA-C  oxyCODONE-acetaminophen (PERCOCET/ROXICET) 5-325 MG tablet Take 1 tablet by mouth every 8 (eight) hours as needed for severe pain. Patient not taking: Reported on 12/20/2017 09/12/17   Aundra Dubin, PA-C  oxyCODONE-acetaminophen (PERCOCET/ROXICET) 5-325 MG tablet Take 1 tablet by mouth every 6 (six) hours as needed for severe pain. Patient not taking: Reported on 12/20/2017 09/23/17   Glyn Ade, PA-C  pantoprazole (PROTONIX) 40 MG  tablet Take 1 tablet (40 mg total) by mouth daily. Take 30-60 min before first meal of the day Patient not taking: Reported on 05/19/2017 03/08/17   Tanda Rockers, MD    Family History Family History  Problem Relation Age of Onset  . CAD Father   . Bone cancer Mother     Social History Social History   Tobacco Use  . Smoking status: Current Every Day Smoker    Packs/day: 1.50    Years: 28.00    Pack years: 42.00  . Smokeless tobacco: Never Used  Substance Use Topics  . Alcohol use: Yes    Comment: on occasion  . Drug use: No     Allergies   Patient has no known allergies.   Review of Systems Review of Systems  Constitutional: Positive for fatigue. Negative for fever.  HENT: Negative for sore throat.   Eyes: Negative for visual disturbance.  Respiratory: Positive for cough and shortness of breath.   Cardiovascular: Positive for chest pain. Negative for leg swelling.  Gastrointestinal: Negative for abdominal pain, nausea and vomiting.  Genitourinary: Negative for difficulty urinating.  Musculoskeletal: Negative for back pain and neck stiffness.  Skin: Negative for rash.  Neurological: Negative for syncope and headaches.     Physical Exam Updated Vital Signs BP (!) 118/53 (BP Location: Right Arm)   Pulse (!) 101   Temp 97.9 F (36.6 C) (Oral)   Resp (!) 26   Ht 6\' 2"  (1.88 m)   Wt 121.5 kg   SpO2 92%   BMI 34.39 kg/m   Physical Exam  Constitutional: He is oriented to person, place, and time. He appears well-developed and well-nourished. No distress.  HENT:  Head: Normocephalic and atraumatic.  Eyes: Conjunctivae and EOM are normal.  Neck: Normal range of motion.  Cardiovascular: Normal rate, regular rhythm, normal heart sounds and intact distal pulses. Exam reveals no gallop and no friction rub.  No murmur heard. Pulmonary/Chest: Effort normal. Tachypnea noted. No respiratory distress. He has wheezes (diffuse). He has no rales.  Speaking in short  sentences  Abdominal: Soft. He exhibits no distension. There is no tenderness. There is no guarding.  Musculoskeletal: He exhibits no edema.  Neurological: He is alert and oriented to person, place, and time.  Skin: Skin is warm and dry. He is not diaphoretic.  Nursing note and vitals reviewed.    ED Treatments / Results  Labs (all labs ordered are listed, but only abnormal results are displayed) Labs Reviewed  CBC WITH DIFFERENTIAL/PLATELET - Abnormal; Notable for the following components:      Result Value   Neutro Abs 1.5 (*)    All other components within normal limits  COMPREHENSIVE METABOLIC PANEL - Abnormal; Notable for the following components:   Glucose, Bld 152 (*)    All other components within normal limits  GLUCOSE, CAPILLARY - Abnormal; Notable for the following components:   Glucose-Capillary 337 (*)    All other components within normal limits  MRSA PCR SCREENING  CBC  CREATININE, SERUM  HIV ANTIBODY (ROUTINE TESTING W REFLEX)  MAGNESIUM  COMPREHENSIVE METABOLIC PANEL  CBC  I-STAT TROPONIN, ED    EKG EKG Interpretation  Date/Time:  Friday December 20 2017 11:21:38 EDT Ventricular Rate:  89 PR Interval:  160 QRS Duration: 72 QT Interval:  378 QTC Calculation: 459 R Axis:   38 Text Interpretation:  Sinus rhythm with frequent ventricular-paced complexes Pulmonary disease pattern Abnormal ECG No significant change since last tracing Confirmed by Gareth Morgan [id number]) on 12/20/2017 12:10:10 PM   Radiology Dg Chest 2 View  Result Date: 12/20/2017 CLINICAL DATA:  Shortness of breath for several days EXAM: CHEST - 2 VIEW COMPARISON:  05/18/2017 FINDINGS: Cardiac shadow is stable. Mild thickening of the minor fissure on the right is again seen and stable. No focal infiltrate or sizable effusion is seen. No acute bony abnormality is noted. IMPRESSION: No acute abnormality noted. Electronically Signed   By: Inez Catalina M.D.   On: 12/20/2017 12:54     Procedures Procedures (including critical care time)  Medications Ordered in ED Medications  methylPREDNISolone sodium succinate (SOLU-MEDROL) 40 mg/mL injection 40 mg (40 mg Intravenous Given 12/20/17 1943)  ipratropium-albuterol (DUONEB) 0.5-2.5 (3) MG/3ML nebulizer solution 3 mL (3 mLs Nebulization Given 12/20/17 1936)  albuterol (PROVENTIL) (2.5 MG/3ML) 0.083% nebulizer solution 2.5 mg (2.5 mg Nebulization Not Given 12/20/17 1936)  budesonide (PULMICORT) nebulizer solution 0.5 mg (0.5 mg Nebulization Given 12/20/17 1936)  azithromycin (ZITHROMAX) tablet 500 mg (has no administration in time range)  0.9 %  sodium chloride infusion (has no administration in time range)  acetaminophen (TYLENOL) tablet 650 mg (has no administration in time range)    Or  acetaminophen (TYLENOL) suppository 650 mg (has no administration in time range)  HYDROcodone-acetaminophen (NORCO/VICODIN) 5-325 MG per tablet 1-2 tablet (2 tablets Oral Given 12/20/17 1943)  senna-docusate (Senokot-S) tablet 1 tablet (has no administration in time range)  bisacodyl (DULCOLAX) EC tablet 5 mg (has no administration in time range)  ondansetron (ZOFRAN) tablet 4 mg (has no administration in time range)    Or  ondansetron (ZOFRAN) injection 4 mg (has no administration in time range)  insulin aspart (novoLOG) injection 0-15 Units (has no administration in time range)  enoxaparin (LOVENOX) injection 60 mg (60 mg Subcutaneous Given 12/20/17 1942)  Influenza vac split quadrivalent PF (FLUARIX) injection 0.5 mL (has no administration in time range)  albuterol (PROVENTIL) (2.5 MG/3ML) 0.083% nebulizer solution 5 mg (5 mg Nebulization Given 12/20/17 1116)  albuterol (PROVENTIL,VENTOLIN) solution continuous neb (10 mg/hr Nebulization Given 12/20/17 1142)  methylPREDNISolone sodium succinate (SOLU-MEDROL) 125 mg/2 mL injection 125 mg (125 mg Intravenous Given 12/20/17 1330)  magnesium sulfate IVPB 2 g 50 mL (0 g Intravenous Stopped 12/20/17  1548)  ipratropium (ATROVENT) nebulizer solution 1.5 mg (1.5 mg Nebulization Given 12/20/17 1318)  albuterol (PROVENTIL,VENTOLIN) solution continuous neb (7.5 mg/hr Nebulization Given 12/20/17 1319)  albuterol (PROVENTIL,VENTOLIN) solution continuous neb (15 mg/hr Nebulization Given 12/20/17 1615)  sodium chloride 0.9 % bolus 1,000 mL (0 mLs Intravenous Stopped 12/20/17 1812)  cyclobenzaprine (FLEXERIL) tablet 10 mg (10 mg Oral Given 12/20/17 1628)  acetaminophen (TYLENOL) tablet 1,000 mg (1,000 mg Oral Given 12/20/17 1628)     Initial Impression / Assessment and Plan / ED Course  I have reviewed the triage vital signs and the nursing notes.  Pertinent labs & imaging results that were available during my care of the patient were reviewed by me and considered in my medical decision making (see chart for details).  57 year old male with a history of hypertension, COPD presents with concern for cough and shortness of breath.  Chest x-ray shows no signs of pneumonia or pneumothorax or pulmonary edema.  EKG was evaluate evaluated by me and shows no significant ischemic changes or signs of pericarditis.  Patient without edema on exam, have low suspicion for CHF by history and physical.  Given no PE risk factors, and more likely diagnosis COPD exacerbation, low suspicion for pulmonary embolus.  Suspect likely COPD exacerbation and reaction to chemical irritant of the paint.  Patient with significant wheezing on exam.  Given albuterol in triage without significant improvement.  Add Solu-Medrol, magnesium, Atrovent and continuous albuterol and will reevaluate.  Patient with continuing dyspnea despite 2 continuous treatments. Will admit for continued care   Final Clinical Impressions(s) / ED Diagnoses   Final diagnoses:  COPD exacerbation [facility])    ED Discharge Orders    None       Gareth Morgan, MD 12/20/17 2355

## 2017-12-20 NOTE — ED Notes (Signed)
ED Provider at bedside. 

## 2017-12-20 NOTE — ED Notes (Signed)
Respiratory notified of need for bipap.

## 2017-12-20 NOTE — ED Triage Notes (Signed)
Pt shows no improvement after first breathing treatment, called provider to triage for further orders

## 2017-12-20 NOTE — ED Triage Notes (Signed)
Pt in c/o cough and congestion since painting a house a few days ago, pt has a history of COPD and is out of his inhaler, denies fever, speaking in full sentences in triage but dyspnic with exertion

## 2017-12-20 NOTE — H&P (Signed)
History and Physical    [name]:[id number] DOB: 06-07-1960 DOA: 12/20/2017  PCP: [name], MD Patient coming from: Home  Chief Complaint: Shortness of breath  HPI: [name] is a 57 y.o. male with medical history significant of past medical history of COPD, tobacco use, essential hypertension, medical noncompliance came to the hospital complains of shortness of breath.  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  He also continues to smoke 1 pack of cigarettes daily.  3 days ago started noticing worsening of shortness of breath but did not seek any medical attention.  This continued to progress over the course of last 3 days.  He does not use any bronchodilators or medications at home. In the ER patient was noted to be very dyspneic therefore was given multiple rounds of breathing treatment along with IV Solu-Medrol.  His labs are overall unremarkable.  Chest x-ray was negative for acute pathology.  On physical exam he had diffuse diminished breath sounds with expiratory wheezing.  When I saw the patient he was little dyspneic with obvious use of neck accessory muscles.  He was also diffusely wheezing therefore I offered him to be placed on BiPAP.  Patient agreed to this.  No other new complaints.   Review of Systems: As per HPI otherwise 10 point review of systems negative.  Review of Systems Otherwise negative except as per HPI, including: General: Denies fever, chills, night sweats or unintended weight loss. Resp: Denies hemoptysis Cardiac: Denies chest pain, palpitations, orthopnea, paroxysmal nocturnal dyspnea. GI: Denies abdominal pain, nausea, vomiting, diarrhea or constipation GU: Denies dysuria, frequency, hesitancy or incontinence MS: Denies muscle aches, joint pain or swelling Neuro: Denies headache, neurologic deficits (focal weakness, numbness, tingling), abnormal gait Psych: Denies anxiety, depression,  SI/HI/AVH Skin: Denies new rashes or lesions ID: Denies sick contacts, exotic exposures, travel  Past Medical History:  Diagnosis Date  . COPD (chronic obstructive pulmonary disease) ([place])   . Hypertension     Past Surgical History:  Procedure Laterality Date  . CARDIAC CATHETERIZATION    . HIP SURGERY    . KNEE SURGERY      SOCIAL HISTORY:  reports that he has been smoking. He has a 42.00 pack-year smoking history. He has never used smokeless tobacco. He reports that he drinks alcohol. He reports that he does not use drugs.  No Known Allergies  FAMILY HISTORY: Family History  Problem Relation Age of Onset  . CAD Father   . Bone cancer Mother      Prior to Admission medications   Medication Sig Start Date End Date Taking? Authorizing Provider  albuterol (PROVENTIL HFA;VENTOLIN HFA) 108 (90 Base) MCG/ACT inhaler Inhale 1-2 puffs into the lungs every 6 (six) hours as needed for wheezing or shortness of breath. 06/14/16  Yes Shary Decamp, PA-C  acetaminophen-codeine (TYLENOL #3) 300-30 MG tablet One every 4 hours as needed for cough Patient not taking: Reported on 05/19/2017 03/08/17   Tanda Rockers, MD  budesonide-formoterol (SYMBICORT) 80-4.5 MCG/ACT inhaler Inhale 2 puffs into the lungs 2 (two) times daily. Patient not taking: Reported on 05/19/2017 03/08/17   Tanda Rockers, MD  famotidine (PEPCID) 20 MG tablet One at bedtime Patient not taking: Reported on 05/19/2017 03/08/17   Tanda Rockers, MD  meloxicam (MOBIC) 7.5 MG tablet Take 2 tablets (15 mg total) by mouth daily as needed for pain. Take after completing prednisone pak Patient not taking: Reported on 12/20/2017 10/09/17  Leandrew Koyanagi, MD  methocarbamol (ROBAXIN) 500 MG tablet Take 1 tablet (500 mg total) by mouth 3 (three) times daily as needed for muscle spasms. Patient not taking: Reported on 12/20/2017 09/12/17   Aundra Dubin, PA-C  oxyCODONE (ROXICODONE) 5 MG immediate release tablet Take 0.5-1 tablets  (2.5-5 mg total) by mouth every 4 (four) hours as needed for severe pain. Patient not taking: Reported on 12/20/2017 06/09/17   Margarita Mail, PA-C  oxyCODONE-acetaminophen (PERCOCET/ROXICET) 5-325 MG tablet Take 1 tablet by mouth every 8 (eight) hours as needed for severe pain. Patient not taking: Reported on 12/20/2017 09/12/17   Aundra Dubin, PA-C  oxyCODONE-acetaminophen (PERCOCET/ROXICET) 5-325 MG tablet Take 1 tablet by mouth every 6 (six) hours as needed for severe pain. Patient not taking: Reported on 12/20/2017 09/23/17   Glyn Ade, PA-C  pantoprazole (PROTONIX) 40 MG tablet Take 1 tablet (40 mg total) by mouth daily. Take 30-60 min before first meal of the day Patient not taking: Reported on 05/19/2017 03/08/17   Tanda Rockers, MD    Physical Exam: Vitals:   12/20/17 1500 12/20/17 1618 12/20/17 1627 12/20/17 1645  BP: 120/69  (!) 146/57 (!) 140/59  Pulse: (!) 106  (!) 117 (!) 109  Resp: 19  (!) 26 20  Temp:      TempSrc:      SpO2: 94% 96% 94% 96%      Constitutional: NAD, calm, comfortable Eyes: PERRL, lids and conjunctivae normal ENMT: Mucous membranes are moist. Posterior pharynx clear of any exudate or lesions.Normal dentition.  Neck: normal, supple, no masses, no thyromegaly Respiratory: Diffuse wheezing with use of neck accessory muscles. Cardiovascular: Mild sinus tachycardia., no murmurs / rubs / gallops. No extremity edema. 2+ pedal pulses. No carotid bruits.  Abdomen: no tenderness, no masses palpated. No hepatosplenomegaly. Bowel sounds positive.  Musculoskeletal: no clubbing / cyanosis. No joint deformity upper and lower extremities. Good ROM, no contractures. Normal muscle tone.  Skin: no rashes, lesions, ulcers. No induration Neurologic: CN 2-12 grossly intact. Sensation intact, DTR normal. Strength 5/5 in all 4.  Psychiatric: Normal judgment and insight. Alert and oriented x 3. Normal mood.     Labs on Admission: I have personally reviewed  following labs and imaging studies  CBC: Recent Labs  Lab 12/20/17 1301  WBC 4.5  NEUTROABS 1.5*  HGB 15.6  HCT 49.0  MCV 92.3  PLT 532   Basic Metabolic Panel: Recent Labs  Lab 12/20/17 1301  NA 141  K 3.7  CL 105  CO2 25  GLUCOSE 152*  BUN 7  CREATININE 1.01  CALCIUM 9.1   GFR: CrCl cannot be calculated (Unknown ideal weight.). Liver Function Tests: Recent Labs  Lab 12/20/17 1301  AST 37  ALT 21  ALKPHOS 52  BILITOT 0.8  PROT 6.9  ALBUMIN 3.9   No results for input(s): LIPASE, AMYLASE in the last 168 hours. No results for input(s): AMMONIA in the last 168 hours. Coagulation Profile: No results for input(s): INR, PROTIME in the last 168 hours. Cardiac Enzymes: No results for input(s): CKTOTAL, CKMB, CKMBINDEX, TROPONINI in the last 168 hours. BNP (last 3 results) No results for input(s): PROBNP in the last 8760 hours. HbA1C: No results for input(s): HGBA1C in the last 72 hours. CBG: No results for input(s): GLUCAP in the last 168 hours. Lipid Profile: No results for input(s): CHOL, HDL, LDLCALC, TRIG, CHOLHDL, LDLDIRECT in the last 72 hours. Thyroid Function Tests: No results for input(s): TSH, T4TOTAL, FREET4,  T3FREE, THYROIDAB in the last 72 hours. Anemia Panel: No results for input(s): VITAMINB12, FOLATE, FERRITIN, TIBC, IRON, RETICCTPCT in the last 72 hours. Urine analysis:    Component Value Date/Time   COLORURINE COLORLESS (A) 11/05/2016 2314   APPEARANCEUR CLEAR 11/05/2016 2314   LABSPEC 1.000 (L) 11/05/2016 2314   PHURINE 7.0 11/05/2016 2314   GLUCOSEU NEGATIVE 11/05/2016 2314   HGBUR NEGATIVE 11/05/2016 2314   BILIRUBINUR NEGATIVE 11/05/2016 2314   KETONESUR NEGATIVE 11/05/2016 2314   PROTEINUR NEGATIVE 11/05/2016 2314   UROBILINOGEN 1.0 07/21/2014 1514   NITRITE NEGATIVE 11/05/2016 2314   LEUKOCYTESUR NEGATIVE 11/05/2016 2314   Sepsis Labs: !!!!!!!!!!!!!!!!!!!!!!!!!!!!!!!!!!!!!!!!!!!! @LABRCNTIP (procalcitonin:4,lacticidven:4) )No  results found for this or any previous visit (from the past 240 hour(s)).   Radiological Exams on Admission: Dg Chest 2 View  Result Date: 12/20/2017 CLINICAL DATA:  Shortness of breath for several days EXAM: CHEST - 2 VIEW COMPARISON:  05/18/2017 FINDINGS: Cardiac shadow is stable. Mild thickening of the minor fissure on the right is again seen and stable. No focal infiltrate or sizable effusion is seen. No acute bony abnormality is noted. IMPRESSION: No acute abnormality noted. Electronically Signed   By: Inez Catalina M.D.   On: 12/20/2017 12:54     All images have been reviewed by me personally.    Assessment/Plan Principal Problem:   Acute respiratory distress Active Problems:   Cigarette smoker   HTN (hypertension)   History of noncompliance with medical treatment    Acute respiratory distress with diffuse wheezing Acute exacerbation of mild intermittent COPD with underlying acute bronchitis Active tobacco use - Admit the patient to the stepdown unit as he is going to be placed on BiPAP.  Patient has some increasing work of breathing and obvious use of neck accessory muscles. - IV Solu-Medrol, scheduled and as needed bronchodilators, Pulmicort twice daily - Oral azithromycin for 5 days, incentive spirometry and flutter valve -Supplemental oxygen as necessary. -Counseled to quit using tobacco, nicotine patch if necessary.  Essential hypertension -She does not take any medications at home.  Medical noncompliance - Patient does not take any medications at home and does not really follow up with anyone outpatient.    DVT prophylaxis: Lovenox Code Status: Full code Family Communication: Daughter at bedside Disposition Plan: To be determined depending on his improvement, suspect a little disease.  Here for 36-48 hours. Consults called: None Admission status: We will send the patient to stepdown unit as he requires BiPAP   Time Spent: 65 minutes.  >50% of the time was  devoted to discussing the patients care, assessment, plan and disposition with other care givers along with counseling the patient about the risks and benefits of treatment.   Please Note: This patient record was dictated using Editor, commissioning. Chart creation errors have been sought, but may not always have been located. Such creation errors do not reflect on the Standard of Medical Care.   Marayah Higdon Arsenio Loader MD [facility] Pager [phone]  If 7PM-7AM, please contact night-coverage [email or web address] Password [facility]  12/20/2017, 5:14 PM

## 2017-12-21 LAB — CBC
HEMATOCRIT: 45.1 % (ref 39.0–52.0)
HEMOGLOBIN: 14.4 g/dL (ref 13.0–17.0)
MCH: 29.4 pg (ref 26.0–34.0)
MCHC: 31.9 g/dL (ref 30.0–36.0)
MCV: 92 fL (ref 78.0–100.0)
Platelets: 201 10*3/uL (ref 150–400)
RBC: 4.9 MIL/uL (ref 4.22–5.81)
RDW: 14.2 % (ref 11.5–15.5)
WBC: 6.2 10*3/uL (ref 4.0–10.5)

## 2017-12-21 LAB — COMPREHENSIVE METABOLIC PANEL
ALT: 20 U/L (ref 0–44)
ANION GAP: 10 (ref 5–15)
AST: 32 U/L (ref 15–41)
Albumin: 3.5 g/dL (ref 3.5–5.0)
Alkaline Phosphatase: 55 U/L (ref 38–126)
BUN: 9 mg/dL (ref 6–20)
CHLORIDE: 108 mmol/L (ref 98–111)
CO2: 20 mmol/L — AB (ref 22–32)
Calcium: 8.7 mg/dL — ABNORMAL LOW (ref 8.9–10.3)
Creatinine, Ser: 1.13 mg/dL (ref 0.61–1.24)
Glucose, Bld: 269 mg/dL — ABNORMAL HIGH (ref 70–99)
POTASSIUM: 3.7 mmol/L (ref 3.5–5.1)
SODIUM: 138 mmol/L (ref 135–145)
Total Bilirubin: 0.5 mg/dL (ref 0.3–1.2)
Total Protein: 6 g/dL — ABNORMAL LOW (ref 6.5–8.1)

## 2017-12-21 LAB — HIV ANTIBODY (ROUTINE TESTING W REFLEX): HIV SCREEN 4TH GENERATION: NONREACTIVE

## 2017-12-21 LAB — GLUCOSE, CAPILLARY
Glucose-Capillary: 202 mg/dL — ABNORMAL HIGH (ref 70–99)
Glucose-Capillary: 152 mg/dL — ABNORMAL HIGH (ref 70–99)

## 2017-12-21 LAB — MAGNESIUM: MAGNESIUM: 2.3 mg/dL (ref 1.7–2.4)

## 2017-12-21 MED ORDER — IPRATROPIUM-ALBUTEROL 0.5-2.5 (3) MG/3ML IN SOLN
3.0000 mL | Freq: Three times a day (TID) | RESPIRATORY_TRACT | Status: DC
  Administered 2017-12-22: 3 mL via RESPIRATORY_TRACT
  Filled 2017-12-21: qty 3

## 2017-12-21 MED ORDER — ALBUTEROL SULFATE (2.5 MG/3ML) 0.083% IN NEBU: 2.5000 mg | INHALATION_SOLUTION | Freq: Four times a day (QID) | RESPIRATORY_TRACT | Status: DC | PRN

## 2017-12-21 MED ORDER — METHYLPREDNISOLONE SODIUM SUCC 40 MG IJ SOLR
40.0000 mg | Freq: Two times a day (BID) | INTRAMUSCULAR | Status: DC
  Administered 2017-12-21 – 2017-12-22 (×2): 40 mg via INTRAVENOUS
  Filled 2017-12-21 (×2): qty 1

## 2017-12-21 NOTE — Plan of Care (Signed)
Discussed with patient plan of care for the evening, pain management and the importance of trying to wear his bipap this evening with some teach back displayed.

## 2017-12-21 NOTE — Plan of Care (Signed)
Discussed plan of care for the evening with patient.  Emphasized using the call bell when assistance is needed.  Also discussed strategies to quit smoking.  Patient recognizes if he continues to smoke, he will continue to have breathing and other health issues.  Good teach back displayed.

## 2017-12-21 NOTE — Progress Notes (Signed)
PROGRESS NOTE    [name]  ZOX:[id number] DOB: 1960/07/13 DOA: 12/20/2017 PCP: [name], MD   Brief Narrative:  57 year old male with past medical history of COPD, tobacco use, hypertension, medical noncompliance came to the hospital with complains of shortness of breath.  Patient was found to be in COPD exacerbation with possible acute bronchitis.  Initially required BiPAP and was weaned off of that.   Assessment & Plan:   Principal Problem:   Acute respiratory distress Active Problems:   Cigarette smoker   HTN (hypertension)   History of noncompliance with medical treatment  Acute respiratory distress, significantly improved Acute exacerbation of mild intermittent COPD Active tobacco use - Patient is no longer using accessory muscles.  He has been off BiPAP for 12 hours now.  I will reduce his frequency of Solu-Medrol to every 12 hours.  Continue scheduled and as needed bronchodilators.  Pulmicort twice daily.  Incentive spirometry and flutter valve. - Advised patient to move around in the room and see how he feels.  Supplemental oxygen as necessary. -Counseled to quit using tobacco. -Day 2 of azithromycin   Medical noncompliance -Counseled to remain compliant with medications.    DVT prophylaxis: Subcutaneous Lovenox Code Status: Full code Family Communication: Daughter at bedside Disposition Plan: Maintain hospital stay for at least another 24 hours for IV steroids.  Patient is still wheezing.  Consultants:   None  Procedures:   None  Antimicrobials:   Day 2 azithromycin   Subjective: Feels a little better and able to come off BiPAP today.  Still has significant exertional shortness of breath.  Review of Systems Otherwise negative except as per HPI, including: General: Denies fever, chills, night sweats or unintended weight loss. Resp: Denies cough Cardiac: Denies chest pain, palpitations, orthopnea, paroxysmal nocturnal  dyspnea. GI: Denies abdominal pain, nausea, vomiting, diarrhea or constipation GU: Denies dysuria, frequency, hesitancy or incontinence MS: Denies muscle aches, joint pain or swelling Neuro: Denies headache, neurologic deficits (focal weakness, numbness, tingling), abnormal gait Psych: Denies anxiety, depression, SI/HI/AVH Skin: Denies new rashes or lesions ID: Denies sick contacts, exotic exposures, travel  Objective: Vitals:   12/21/17 0500 12/21/17 0719 12/21/17 0720 12/21/17 0722  BP:  125/78    Pulse:  75    Resp:      Temp:  97.8 F (36.6 C)    TempSrc:  Oral    SpO2:  99% 99% 96%  Weight: 122.1 kg     Height:        Intake/Output Summary (Last 24 hours) at 12/21/2017 0820 Last data filed at 12/20/2017 1812 Gross per 24 hour  Intake 1000 ml  Output -  Net 1000 ml   Filed Weights   12/20/17 1829 12/21/17 0500  Weight: 121.5 kg 122.1 kg    Examination:  General exam: Appears calm and comfortable  Respiratory system: Diffuse expiratory wheezing but greatly improved from yesterday. Cardiovascular system: S1 & S2 heard, RRR. No JVD, murmurs, rubs, gallops or clicks. No pedal edema. Gastrointestinal system: Abdomen is nondistended, soft and nontender. No organomegaly or masses felt. Normal bowel sounds heard. Central nervous system: Alert and oriented. No focal neurological deficits. Extremities: Symmetric 5 x 5 power. Skin: No rashes, lesions or ulcers Psychiatry: Judgement and insight appear normal. Mood & affect appropriate.     Data Reviewed:   CBC: Recent Labs  Lab 12/20/17 1301 12/20/17 1717 12/21/17 0213  WBC 4.5 6.6 6.2  NEUTROABS 1.5*  --   --   HGB 15.6  14.6 14.4  HCT 49.0 46.1 45.1  MCV 92.3 93.1 92.0  PLT 210 194 789   Basic Metabolic Panel: Recent Labs  Lab 12/20/17 1301 12/20/17 1717 12/21/17 0213  NA 141  --  138  K 3.7  --  3.7  CL 105  --  108  CO2 25  --  20*  GLUCOSE 152*  --  269*  BUN 7  --  9  CREATININE 1.01 1.06 1.13   CALCIUM 9.1  --  8.7*  MG  --   --  2.3   GFR: Estimated Creatinine Clearance: 100.2 mL/min (by C-G formula based on SCr of 1.13 mg/dL). Liver Function Tests: Recent Labs  Lab 12/20/17 1301 12/21/17 0213  AST 37 32  ALT 21 20  ALKPHOS 52 55  BILITOT 0.8 0.5  PROT 6.9 6.0*  ALBUMIN 3.9 3.5   No results for input(s): LIPASE, AMYLASE in the last 168 hours. No results for input(s): AMMONIA in the last 168 hours. Coagulation Profile: No results for input(s): INR, PROTIME in the last 168 hours. Cardiac Enzymes: No results for input(s): CKTOTAL, CKMB, CKMBINDEX, TROPONINI in the last 168 hours. BNP (last 3 results) No results for input(s): PROBNP in the last 8760 hours. HbA1C: No results for input(s): HGBA1C in the last 72 hours. CBG: Recent Labs  Lab 12/20/17 2216 12/21/17 0718  GLUCAP 337* 202*   Lipid Profile: No results for input(s): CHOL, HDL, LDLCALC, TRIG, CHOLHDL, LDLDIRECT in the last 72 hours. Thyroid Function Tests: No results for input(s): TSH, T4TOTAL, FREET4, T3FREE, THYROIDAB in the last 72 hours. Anemia Panel: No results for input(s): VITAMINB12, FOLATE, FERRITIN, TIBC, IRON, RETICCTPCT in the last 72 hours. Sepsis Labs: No results for input(s): PROCALCITON, LATICACIDVEN in the last 168 hours.  Recent Results (from the past 240 hour(s))  MRSA PCR Screening     Status: None   Collection Time: 12/20/17  6:29 PM  Result Value Ref Range Status   MRSA by PCR NEGATIVE NEGATIVE Final    Comment:        The GeneXpert MRSA Assay (FDA approved for NASAL specimens only), is one component of a comprehensive MRSA colonization surveillance program. It is not intended to diagnose MRSA infection nor to guide or monitor treatment for MRSA infections. Performed at [facility], [place] [address], [place], [place] [address]          Radiology Studies: Dg Chest 2 View  Result Date: 12/20/2017 CLINICAL DATA:  Shortness of breath for several days EXAM:  CHEST - 2 VIEW COMPARISON:  05/18/2017 FINDINGS: Cardiac shadow is stable. Mild thickening of the minor fissure on the right is again seen and stable. No focal infiltrate or sizable effusion is seen. No acute bony abnormality is noted. IMPRESSION: No acute abnormality noted. Electronically Signed   By: Inez Catalina M.D.   On: 12/20/2017 12:54        Scheduled Meds: . azithromycin  500 mg Oral Daily  . budesonide (PULMICORT) nebulizer solution  0.5 mg Nebulization BID  . enoxaparin (LOVENOX) injection  60 mg Subcutaneous Q24H  . Influenza vac split quadrivalent PF  0.5 mL Intramuscular Tomorrow-1000  . insulin aspart  0-15 Units Subcutaneous TID WC  . ipratropium-albuterol  3 mL Nebulization Q6H  . methylPREDNISolone (SOLU-MEDROL) injection  40 mg Intravenous Q6H   Continuous Infusions: . sodium chloride       LOS: 1 day   Time spent= 25 mins    Ankit Arsenio Loader, MD [facility] Pager [phone]   If 7PM-7AM, please contact night-coverage [email or web address] Password [email or web address] 12/21/2017, 8:20 AM

## 2017-12-22 DIAGNOSIS — J441 Chronic obstructive pulmonary disease with (acute) exacerbation: Principal | ICD-10-CM

## 2017-12-22 LAB — BASIC METABOLIC PANEL
Anion gap: 6 (ref 5–15)
BUN: 12 mg/dL (ref 6–20)
CALCIUM: 8.6 mg/dL — AB (ref 8.9–10.3)
CO2: 21 mmol/L — ABNORMAL LOW (ref 22–32)
Chloride: 113 mmol/L — ABNORMAL HIGH (ref 98–111)
Creatinine, Ser: 0.91 mg/dL (ref 0.61–1.24)
GFR calc Af Amer: >60 mL/min (ref >60)
Glucose, Bld: 132 mg/dL — ABNORMAL HIGH (ref 70–99)
POTASSIUM: 4.6 mmol/L (ref 3.5–5.1)
SODIUM: 140 mmol/L (ref 135–145)

## 2017-12-22 LAB — MAGNESIUM: Magnesium: 2.2 mg/dL (ref 1.7–2.4)

## 2017-12-22 MED ORDER — BUDESONIDE-FORMOTEROL FUMARATE 80-4.5 MCG/ACT IN AERO: 2.0000 | INHALATION_SPRAY | Freq: Two times a day (BID) | RESPIRATORY_TRACT | 0 refills | Status: DC

## 2017-12-22 MED ORDER — AZITHROMYCIN 500 MG PO TABS: 500.0000 mg | ORAL_TABLET | Freq: Every day | ORAL | 0 refills | Status: AC

## 2017-12-22 MED ORDER — FAMOTIDINE 20 MG PO TABS
20.0000 mg | ORAL_TABLET | Freq: Every day | ORAL | Status: DC
  Administered 2017-12-22: 20 mg via ORAL
  Filled 2017-12-22: qty 1

## 2017-12-22 MED ORDER — NICOTINE 14 MG/24HR TD PT24
14.0000 mg | MEDICATED_PATCH | Freq: Every day | TRANSDERMAL | Status: DC
  Administered 2017-12-22: 14 mg via TRANSDERMAL
  Filled 2017-12-22: qty 1

## 2017-12-22 MED ORDER — PREDNISONE 20 MG PO TABS: 60.0000 mg | ORAL_TABLET | Freq: Every day | ORAL | Status: DC

## 2017-12-22 MED ORDER — PREDNISONE 20 MG PO TABS: ORAL_TABLET | ORAL | 0 refills | Status: DC

## 2017-12-22 MED ORDER — NICOTINE 14 MG/24HR TD PT24: 14.0000 mg | MEDICATED_PATCH | Freq: Every day | TRANSDERMAL | 0 refills | Status: DC

## 2017-12-22 MED ORDER — ALBUTEROL SULFATE HFA 108 (90 BASE) MCG/ACT IN AERS: 1.0000 | INHALATION_SPRAY | Freq: Four times a day (QID) | RESPIRATORY_TRACT | 0 refills | Status: DC | PRN

## 2017-12-22 MED ORDER — IPRATROPIUM-ALBUTEROL 0.5-2.5 (3) MG/3ML IN SOLN: 3.0000 mL | Freq: Three times a day (TID) | RESPIRATORY_TRACT | 0 refills | Status: DC

## 2017-12-22 MED ORDER — FAMOTIDINE 20 MG PO TABS: ORAL_TABLET | ORAL | 0 refills | Status: DC

## 2017-12-22 NOTE — Care Management (Signed)
[facility] notified that patient will need nebulizer delivered to room prior to dc.

## 2017-12-22 NOTE — Progress Notes (Signed)
RN assessed pt's pulse ox while ambulating on room air. Pt was at 95% with no dyspnea. RN reported finding to [name] MD.   RN educated pt that he could continue to walk unit but was not allowed to walk off unit. Pt verbalized understanding. While RN in another pt's room pt walked off unit to buy coffee. RN reminded pt of hospital policy and notified [name] MD.   Plan for pt today is discharge. RN will continue to monitor pt.

## 2017-12-22 NOTE — Discharge Instructions (Signed)
Health Risks of Smoking Smoking cigarettes is very bad for your health. Tobacco smoke has over 200 known poisons in it. It contains the poisonous gases nitrogen oxide and carbon monoxide. There are over 60 chemicals in tobacco smoke that cause cancer. Smoking is difficult to quit because a chemical in tobacco, called nicotine, causes addiction or dependence. When you smoke and inhale, nicotine is absorbed rapidly into the bloodstream through your lungs. Both inhaled and non-inhaled nicotine may be addictive. What are the risks of cigarette smoke? Cigarette smokers have an increased risk of many serious medical problems, including:  Lung cancer.  Lung disease, such as pneumonia, bronchitis, and emphysema.  Chest pain (angina) and heart attack because the heart is not getting enough oxygen.  Heart disease and peripheral blood vessel disease.  High blood pressure (hypertension).  Stroke.  Oral cancer, including cancer of the lip, mouth, or voice box.  Bladder cancer.  Pancreatic cancer.  Cervical cancer.  Pregnancy complications, including premature birth.  Stillbirths and smaller newborn babies, birth defects, and genetic damage to sperm.  Early menopause.  Lower estrogen level for women.  Infertility.  Facial wrinkles.  Blindness.  Increased risk of broken bones (fractures).  Senile dementia.  Stomach ulcers and internal bleeding.  Delayed wound healing and increased risk of complications during surgery.  Even smoking lightly shortens your life expectancy by several years.  Because of secondhand smoke exposure, children of smokers have an increased risk of the following:  Sudden infant death syndrome (SIDS).  Respiratory infections.  Lung cancer.  Heart disease.  Ear infections.  What are the benefits of quitting? There are many health benefits of quitting smoking. Here are some of them:  Within days of quitting smoking, your risk of having a heart  attack decreases, your blood flow improves, and your lung capacity improves. Blood pressure, pulse rate, and breathing patterns start returning to normal soon after quitting.  Within months, your lungs may clear up completely.  Quitting for 10 years reduces your risk of developing lung cancer and heart disease to almost that of a nonsmoker.  People who quit may see an improvement in their overall quality of life.  How do I quit smoking? Smoking is an addiction with both physical and psychological effects, and longtime habits can be hard to change. Your health care provider can recommend:  Programs and community resources, which may include group support, education, or talk therapy.  Prescription medicines to help reduce cravings.  Nicotine replacement products, such as patches, gum, and nasal sprays. Use these products only as directed. Do not replace cigarette smoking with electronic cigarettes, which are commonly called e-cigarettes. The safety of e-cigarettes is not known, and some may contain harmful chemicals.  A combination of two or more of these methods.  Where to find more information:  American Lung Association: www.lung.org  American Cancer Society: www.cancer.org Summary  Smoking cigarettes is very bad for your health. Cigarette smokers have an increased risk of many serious medical problems, including several cancers, heart disease, and stroke.  Smoking is an addiction with both physical and psychological effects, and longtime habits can be hard to change.  By stopping right away, you can greatly reduce the risk of medical problems for you and your family.  To help you quit smoking, your health care provider can recommend programs, community resources, prescription medicines, and nicotine replacement products such as patches, gum, and nasal sprays. This information is not intended to replace advice given to you by your health  care provider. Make sure you discuss any  questions you have with your health care provider. Document Released: 04/19/2004 Document Revised: 03/16/2016 Document Reviewed: 03/16/2016 Elsevier Interactive Patient Education  2017 [employer]vier Inc.   Chronic Obstructive Pulmonary Disease Chronic obstructive pulmonary disease (COPD) is a long-term (chronic) lung problem. When you have COPD, it is hard for air to get in and out of your lungs. The way your lungs work will never return to normal. Usually the condition gets worse over time. There are things you can do to keep yourself as healthy as possible. Your doctor may treat your condition with:  Medicines.  Quitting smoking, if you smoke.  Rehabilitation. This may involve a team of specialists.  Oxygen.  Exercise and changes to your diet.  Lung surgery.  Comfort measures (palliative care).  Follow these instructions at home: Medicines  Take over-the-counter and prescription medicines only as told by your doctor.  Talk to your doctor before taking any cough or allergy medicines. You may need to avoid medicines that cause your lungs to be dry. Lifestyle  If you smoke, stop. Smoking makes the problem worse. If you need help quitting, ask your doctor.  Avoid being around things that make your breathing worse. This may include smoke, chemicals, and fumes.  Stay active, but remember to also rest.  Learn and use tips on how to relax.  Make sure you get enough sleep. Most adults need at least 7 hours a night.  Eat healthy foods. Eat smaller meals more often. Rest before meals. Controlled breathing  Learn and use tips on how to control your breathing as told by your doctor. Try: ? Breathing in (inhaling) through your nose for 1 second. Then, pucker your lips and breath out (exhale) through your lips for 2 seconds. ? Putting one hand on your belly (abdomen). Breathe in slowly through your nose for 1 second. Your hand on your belly should move out. Pucker your lips and breathe  out slowly through your lips. Your hand on your belly should move in as you breathe out. Controlled coughing  Learn and use controlled coughing to clear mucus from your lungs. The steps are: 1. Lean your head a little forward. 2. Breathe in deeply. 3. Try to hold your breath for 3 seconds. 4. Keep your mouth slightly open while coughing 2 times. 5. Spit any mucus out into a tissue. 6. Rest and do the steps again 1 or 2 times as needed. General instructions  Make sure you get all the shots (vaccines) that your doctor recommends. Ask your doctor about a flu shot and a pneumonia shot.  Use oxygen therapy and therapy to help improve your lungs (pulmonary rehabilitation) if told by your doctor. If you need home oxygen therapy, ask your doctor if you should buy a tool to measure your oxygen level (oximeter).  Make a COPD action plan with your doctor. This helps you know what to do if you feel worse than usual.  Manage any other conditions you have as told by your doctor.  Avoid going outside when it is very hot, cold, or humid.  Avoid people who have a sickness you can catch (contagious).  Keep all follow-up visits as told by your doctor. This is important. Contact a doctor if:  You cough up more mucus than usual.  There is a change in the color or thickness of the mucus.  It is harder to breathe than usual.  Your breathing is faster than usual.  You have  trouble sleeping.  You need to use your medicines more often than usual.  You have trouble doing your normal activities such as getting dressed or walking around the house. Get help right away if:  You have shortness of breath while resting.  You have shortness of breath that stops you from: ? Being able to talk. ? Doing normal activities.  Your chest hurts for longer than 5 minutes.  Your skin color is more blue than usual.  Your pulse oximeter shows that you have low oxygen for longer than 5 minutes.  You have a  fever.  You feel too tired to breathe normally. Summary  Chronic obstructive pulmonary disease (COPD) is a long-term lung problem.  The way your lungs work will never return to normal. Usually the condition gets worse over time. There are things you can do to keep yourself as healthy as possible.  Take over-the-counter and prescription medicines only as told by your doctor.  If you smoke, stop. Smoking makes the problem worse. This information is not intended to replace advice given to you by your health care provider. Make sure you discuss any questions you have with your health care provider. Document Released: 08/29/2007 Document Revised: 08/18/2015 Document Reviewed: 11/06/2012 Elsevier Interactive Patient Education  2017 [employer].

## 2017-12-22 NOTE — Discharge Summary (Signed)
[facility]  Physician Discharge Summary   Patient ID: [name] MRN: [id number] DOB/AGE: 58-Jul-1962 58 y.o.  Admit date: 12/20/2017 Discharge date: 12/22/2017  PCP: [name], MD  DISCHARGE DIAGNOSES:  Acute COPD exacerbation Tobacco abuse  RECOMMENDATIONS FOR OUTPATIENT FOLLOW UP: 1. Patient encouraged to follow-up with primary care provider within 1 week   DISCHARGE CONDITION: fair  Diet recommendation: As before  Filed Weights   12/20/17 1829 12/21/17 0500 12/22/17 0514  Weight: 121.5 kg 122.1 kg 123.6 kg    INITIAL HISTORY: 57 year old male with past medical history of COPD, tobacco use, hypertension, medical noncompliance came to the hospital with complains of shortness of breath.  Patient was found to be in COPD exacerbation with possible acute bronchitis.  Initially required BiPAP.    HOSPITAL COURSE:   Acute exacerbation of COPD Patient initially required BiPAP.  He was taken off of it.  He did wear it for about 4 hours last night.  Feels much better this morning.  Patient was also given steroids nebulizer treatments.  He was also given antibiotics.  Patient feels better this morning and wants to go home.  He was ambulated in the hallway.  His heart rate did go up to 140 but then came back to normal within a few minutes.  His sats were normal.  Tobacco abuse Patient counseled.  Nicotine patch prescribed.  Outpatient follow-up with PCP.  Medical noncompliance Counseled to remain compliant with medications.  Overall stable.  Very keen on going home today.  Okay for discharge.     PERTINENT LABS:  The results of significant diagnostics from this hospitalization (including imaging, microbiology, ancillary and laboratory) are listed below for reference.    Microbiology: Recent Results (from the past 240 hour(s))  MRSA PCR Screening     Status: None   Collection Time: 12/20/17  6:29 PM  Result Value Ref Range Status   MRSA by  PCR NEGATIVE NEGATIVE Final    Comment:        The GeneXpert MRSA Assay (FDA approved for NASAL specimens only), is one component of a comprehensive MRSA colonization surveillance program. It is not intended to diagnose MRSA infection nor to guide or monitor treatment for MRSA infections. Performed at [facility], [place] [address] [place], [place] [address]      Labs: Basic Metabolic Panel: Recent Labs  Lab 12/20/17 1301 12/20/17 1717 12/21/17 0213 12/22/17 0417  NA 141  --  138 140  K 3.7  --  3.7 4.6  CL 105  --  108 113*  CO2 25  --  20* 21*  GLUCOSE 152*  --  269* 132*  BUN 7  --  9 12  CREATININE 1.01 1.06 1.13 0.91  CALCIUM 9.1  --  8.7* 8.6*  MG  --   --  2.3 2.2   Liver Function Tests: Recent Labs  Lab 12/20/17 1301 12/21/17 0213  AST 37 32  ALT 21 20  ALKPHOS 52 55  BILITOT 0.8 0.5  PROT 6.9 6.0*  ALBUMIN 3.9 3.5   CBC: Recent Labs  Lab 12/20/17 1301 12/20/17 1717 12/21/17 0213  WBC 4.5 6.6 6.2  NEUTROABS 1.5*  --   --   HGB 15.6 14.6 14.4  HCT 49.0 46.1 45.1  MCV 92.3 93.1 92.0  PLT 210 194 201    CBG: Recent Labs  Lab 12/20/17 2216 12/21/17 0718 12/21/17 2159  GLUCAP 337* 202* 152*     IMAGING STUDIES Dg Chest  2 View  Result Date: 12/20/2017 CLINICAL DATA:  Shortness of breath for several days EXAM: CHEST - 2 VIEW COMPARISON:  05/18/2017 FINDINGS: Cardiac shadow is stable. Mild thickening of the minor fissure on the right is again seen and stable. No focal infiltrate or sizable effusion is seen. No acute bony abnormality is noted. IMPRESSION: No acute abnormality noted. Electronically Signed   By: Inez Catalina M.D.   On: 12/20/2017 12:54    DISCHARGE EXAMINATION: Vitals:   12/21/17 2320 12/22/17 0514 12/22/17 0738 12/22/17 0918  BP:   (!) 135/101   Pulse: 74  65   Resp: 18  15   Temp:   (!) 97.4 F (36.3 C)   TempSrc:   Oral   SpO2: 96%  93% 97%  Weight:  123.6 kg    Height:       General appearance: alert,  cooperative, appears stated age and no distress Resp: Normal effort at rest.  Few scattered wheezes heard bilaterally.  No crackles.  No rhonchi. Cardio: regular rate and rhythm, S1, S2 normal, no murmur, click, rub or gallop GI: soft, non-tender; bowel sounds normal; no masses,  no organomegaly  DISPOSITION: Home  Discharge Instructions    Call MD for:  difficulty breathing, headache or visual disturbances   Complete by:  As directed    Call MD for:  extreme fatigue   Complete by:  As directed    Call MD for:  persistant dizziness or light-headedness   Complete by:  As directed    Call MD for:  persistant nausea and vomiting   Complete by:  As directed    Call MD for:  severe uncontrolled pain   Complete by:  As directed    Call MD for:  temperature >100.4   Complete by:  As directed    Diet general   Complete by:  As directed    Discharge instructions   Complete by:  As directed    Please be sure to follow-up with your primary care provider within 1 week.  Please stop smoking cigarettes.  Please take your medications as prescribed.  Talk to your doctor about getting a sleep study.  You were cared for by a hospitalist during your hospital stay. If you have any questions about your discharge medications or the care you received while you were in the hospital after you are discharged, you can call the unit and asked to speak with the hospitalist on call if the hospitalist that took care of you is not available. Once you are discharged, your primary care physician will handle any further medical issues. Please note that NO REFILLS for any discharge medications will be authorized once you are discharged, as it is imperative that you return to your primary care physician (or establish a relationship with a primary care physician if you do not have one) for your aftercare needs so that they can reassess your need for medications and monitor your lab values. If you do not have a primary care  physician, you can call [phone] for a physician referral.   Increase activity slowly   Complete by:  As directed         Allergies as of 12/22/2017   No Known Allergies     Medication List    STOP taking these medications   acetaminophen-codeine 300-30 MG tablet Commonly known as:  TYLENOL #3   meloxicam 7.5 MG tablet Commonly known as:  MOBIC   methocarbamol 500 MG tablet Commonly known  as:  ROBAXIN   oxyCODONE 5 MG immediate release tablet Commonly known as:  Oxy IR/ROXICODONE   oxyCODONE-acetaminophen 5-325 MG tablet Commonly known as:  PERCOCET/ROXICET   pantoprazole 40 MG tablet Commonly known as:  PROTONIX     TAKE these medications   albuterol 108 (90 Base) MCG/ACT inhaler Commonly known as:  PROVENTIL HFA;VENTOLIN HFA Inhale 1-2 puffs into the lungs every 6 (six) hours as needed for wheezing or shortness of breath.   azithromycin 500 MG tablet Commonly known as:  ZITHROMAX Take 1 tablet (500 mg total) by mouth daily for 2 days.   budesonide-formoterol 80-4.5 MCG/ACT inhaler Commonly known as:  SYMBICORT Inhale 2 puffs into the lungs 2 (two) times daily.   famotidine 20 MG tablet Commonly known as:  PEPCID One at bedtime   ipratropium-albuterol 0.5-2.5 (3) MG/3ML Soln Commonly known as:  DUONEB Take 3 mLs by nebulization 3 (three) times daily.   nicotine 14 mg/24hr patch Commonly known as:  NICODERM CQ - dosed in mg/24 hours Place 1 patch (14 mg total) onto the skin daily.   predniSONE 20 MG tablet Commonly known as:  DELTASONE Take 3 tablets once daily for 5 days followed by 2 tablets once daily for 5 days followed by 1 tablet once daily for 5 days and then stop            Durable Medical Equipment  (From admission, onward)         Start     Ordered   12/22/17 1038  For home use only DME Nebulizer machine  Once    Question:  Patient needs a nebulizer to treat with the following condition  Answer:  COPD (chronic obstructive pulmonary  disease) ([place])   12/22/17 1037           Follow-up Information    Pavelock, Ralene Bathe, MD. Schedule an appointment as soon as possible for a visit in 1 week(s).   Specialty:  Internal Medicine Contact information: [address] [place] [address] [phone]           TOTAL DISCHARGE TIME: 59 minutes  Bonnielee Haff  [facility] Pager [phone]  12/22/2017, 1:32 PM

## 2017-12-22 NOTE — Progress Notes (Signed)
Patient was stable at discharge. I removed their IV. We reviewed the discharge education. Patient/Family verbalized understanding and had no further questions. Patient left with prescription/s in hand.  

## 2017-12-23 LAB — GLUCOSE, CAPILLARY
GLUCOSE-CAPILLARY: 103 mg/dL — AB (ref 70–99)
GLUCOSE-CAPILLARY: 102 mg/dL — AB (ref 70–99)

## 2018-04-15 ENCOUNTER — Ambulatory Visit (INDEPENDENT_AMBULATORY_CARE_PROVIDER_SITE_OTHER): Payer: Self-pay | Admitting: Orthopaedic Surgery

## 2018-04-15 ENCOUNTER — Other Ambulatory Visit (INDEPENDENT_AMBULATORY_CARE_PROVIDER_SITE_OTHER): Payer: Self-pay

## 2018-04-15 DIAGNOSIS — M25571 Pain in right ankle and joints of right foot: Secondary | ICD-10-CM

## 2018-04-17 ENCOUNTER — Ambulatory Visit (INDEPENDENT_AMBULATORY_CARE_PROVIDER_SITE_OTHER): Payer: Self-pay | Admitting: Orthopaedic Surgery

## 2018-04-22 ENCOUNTER — Ambulatory Visit
Admission: RE | Admit: 2018-04-22 | Discharge: 2018-04-22 | Disposition: A | Discharge status: Home / Self Care | Payer: Medicaid Other | Source: Ambulatory Visit | Attending: Orthopaedic Surgery | Admitting: Orthopaedic Surgery

## 2018-04-22 DIAGNOSIS — M25571 Pain in right ankle and joints of right foot: Secondary | ICD-10-CM

## 2018-04-24 ENCOUNTER — Ambulatory Visit (INDEPENDENT_AMBULATORY_CARE_PROVIDER_SITE_OTHER): Payer: Self-pay | Admitting: Orthopaedic Surgery

## 2018-04-24 ENCOUNTER — Encounter (INDEPENDENT_AMBULATORY_CARE_PROVIDER_SITE_OTHER): Payer: Self-pay | Admitting: Orthopaedic Surgery

## 2018-04-24 DIAGNOSIS — M7661 Achilles tendinitis, right leg: Secondary | ICD-10-CM | POA: Insufficient documentation

## 2018-04-24 MED ORDER — TRAMADOL HCL 50 MG PO TABS: 50.0000 mg | ORAL_TABLET | Freq: Four times a day (QID) | ORAL | 1 refills | Status: DC | PRN

## 2018-04-24 NOTE — Progress Notes (Signed)
Office Visit Note   Patient: [name]           Date of Birth: [birth year]           MRN: [id number] Visit Date: 04/24/2018              Requested by: [name], MD [address] [place], [place] [address] PCP: [name], MD   Assessment & Plan: Visit Diagnoses:  1. Achilles tendinitis, right leg     Plan: Impression is right Achilles tendinopathy.  We have discussed mobilizing this in a cam walker for a longer duration of time.  The patient thinks he will not be compliant with this and would prefer to be in a short leg cast.  We will apply this today for the next 6 weeks.  He has been advised to take a baby aspirin twice daily for DVT prophylaxis.  Follow-up with [place] in 6 weeks time for recheck.  Follow-Up Instructions: Return in about 6 weeks (around 06/05/2018).   Orders:  No orders of the defined types were placed in this encounter.  Meds ordered this encounter  Medications  . traMADol (ULTRAM) 50 MG tablet    Sig: Take 1 tablet (50 mg total) by mouth every 6 (six) hours as needed.    Dispense:  30 tablet    Refill:  1      Procedures: No procedures performed   Clinical Data: No additional findings.   Subjective: Chief Complaint  Patient presents with  . Right Ankle - Pain, Follow-up    HPI patient is a pleasant 58 year old gentleman who presents our clinic today discuss MRI results of his right heel.  History of right heel pain and Achilles tendinitis for the past couple years.  He has tried a cam walker but for no longer than 3 weeks at a time without significant relief of symptoms.  He continues to have heel pain worse with ambulation.  Nothing seems to help this.  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  No partial or full-thickness tear.  Review of Systems as detailed HPI.  All others reviewed and are negative.   Objective: Vital Signs: There  were no vitals taken for this visit.  Physical Exam well-developed well-nourished gentleman in no acute distress.  Alert and oriented x3.  Ortho Exam examination of his right heel reveals marked tenderness to the Achilles tendon.  Increased pain with dorsiflexion.  No tenderness to the pre-Achilles bursa.  Negative Thompson's.  He is neurovascularly intact distally.  Specialty Comments:  No specialty comments available.  Imaging: No new imaging   PMFS History: Patient Active Problem List   Diagnosis Date Noted  . Achilles tendinitis, right leg 04/24/2018  . Acute respiratory distress 12/20/2017  . History of noncompliance with medical treatment 12/20/2017  . Acute sinusitis, unspecified 03/15/2017  . Rupture of right distal biceps tendon 03/01/2016  . Pain in right lower leg 01/30/2016  . Chest pain 04/26/2015  . Hypokalemia 04/26/2015  . COPD GOLD 0 / active smoker 04/26/2015  . Pain in the chest   . Fall 07/22/2014  . Multiple fractures of ribs of right side 07/22/2014  . Pneumothorax on right 07/22/2014  . Traumatic pneumohemothorax 07/21/2014  . Altered mental state 04/15/2013  . Acute encephalopathy 04/15/2013  . Difficulty speaking 04/15/2013  . Headache(784.0) 04/15/2013  . Cigarette smoker 04/15/2013  . HTN (hypertension) 04/15/2013   Past Medical History:  Diagnosis Date  . COPD (chronic obstructive pulmonary disease) ([place])   . Hypertension     Family History  Problem Relation Age of Onset  . CAD Father   . Bone cancer Mother     Past Surgical History:  Procedure Laterality Date  . CARDIAC CATHETERIZATION    . HIP SURGERY    . KNEE SURGERY     Social History   Occupational History  . Not on file  Tobacco Use  . Smoking status: Current Every Day Smoker    Packs/day: 1.50    Years: 28.00    Pack years: 42.00  . Smokeless tobacco: Never Used  Substance and Sexual Activity  . Alcohol use: Yes    Comment: on occasion  . Drug use: No  . Sexual  activity: Not on file

## 2018-05-19 ENCOUNTER — Encounter (HOSPITAL_COMMUNITY): Payer: Self-pay

## 2018-05-19 ENCOUNTER — Other Ambulatory Visit: Payer: Self-pay

## 2018-05-19 ENCOUNTER — Emergency Department (HOSPITAL_COMMUNITY)
Admission: EM | Admit: 2018-05-19 | Discharge: 2018-05-20 | Disposition: A | Discharge status: Home / Self Care | Payer: Medicaid Other | Attending: Emergency Medicine | Admitting: Emergency Medicine

## 2018-05-19 DIAGNOSIS — K0889 Other specified disorders of teeth and supporting structures: Secondary | ICD-10-CM | POA: Diagnosis present

## 2018-05-19 DIAGNOSIS — I10 Essential (primary) hypertension: Secondary | ICD-10-CM | POA: Insufficient documentation

## 2018-05-19 DIAGNOSIS — F17200 Nicotine dependence, unspecified, uncomplicated: Secondary | ICD-10-CM | POA: Diagnosis not present

## 2018-05-19 DIAGNOSIS — J441 Chronic obstructive pulmonary disease with (acute) exacerbation: Secondary | ICD-10-CM | POA: Diagnosis not present

## 2018-05-19 NOTE — ED Triage Notes (Signed)
Pt states he has a toothache and has had it for the last 2 weeks.  Went to the dentist and told he needs to have a tooth pulled but has not yet.

## 2018-05-19 NOTE — ED Triage Notes (Signed)
Pt also states new shortness of breath starting tonight.  Hx of COPD.  Lungs diminished but clear.

## 2018-05-20 ENCOUNTER — Emergency Department (HOSPITAL_COMMUNITY): Payer: Medicaid Other

## 2018-05-20 MED ORDER — ALBUTEROL SULFATE (2.5 MG/3ML) 0.083% IN NEBU
2.5000 mg | INHALATION_SOLUTION | Freq: Once | RESPIRATORY_TRACT | Status: AC
  Administered 2018-05-20: 2.5 mg via RESPIRATORY_TRACT
  Filled 2018-05-20: qty 3

## 2018-05-20 MED ORDER — IPRATROPIUM-ALBUTEROL 0.5-2.5 (3) MG/3ML IN SOLN
3.0000 mL | Freq: Once | RESPIRATORY_TRACT | Status: AC
  Administered 2018-05-20: 3 mL via RESPIRATORY_TRACT
  Filled 2018-05-20: qty 3

## 2018-05-20 MED ORDER — PREDNISONE 20 MG PO TABS
60.0000 mg | ORAL_TABLET | Freq: Once | ORAL | Status: AC
  Administered 2018-05-20: 60 mg via ORAL
  Filled 2018-05-20: qty 3

## 2018-05-20 MED ORDER — TRAMADOL HCL 50 MG PO TABS: 50.0000 mg | ORAL_TABLET | Freq: Four times a day (QID) | ORAL | 0 refills | Status: DC | PRN

## 2018-05-20 MED ORDER — PREDNISONE 20 MG PO TABS: 40.0000 mg | ORAL_TABLET | Freq: Every day | ORAL | 0 refills | Status: DC

## 2018-05-20 NOTE — ED Provider Notes (Addendum)
[facility] EMERGENCY DEPARTMENT Provider Note   CSN: [id number] Arrival date & time: 05/19/18  2308    History   Chief Complaint Chief Complaint  Patient presents with  . Dental Pain  . Shortness of Breath    HPI [name] is a 58 y.o. male.     Patient presents to the emergency department for evaluation of toothache.  Patient having severe and constant pain on the right lower side of his mouth.  This is been ongoing for 2 weeks.  He reports that it has worsened over the last week and he has not been able to sleep.  He reports that he saw a dentist for this problem and was told it would cost $200 to have the tooth pulled.  He was not able to afford this, is currently taking antibiotics and over-the-counter pain medication without improvement.  No facial swelling or fever.  Patient noted to be wheezing in triage.  He reports that he has a history of COPD.  He states that he has not here for his breathing, thinks that he is breathing heavily because of the pain.     Past Medical History:  Diagnosis Date  . COPD (chronic obstructive pulmonary disease) ([place])   . Hypertension     Patient Active Problem List   Diagnosis Date Noted  . Achilles tendinitis, right leg 04/24/2018  . Acute respiratory distress 12/20/2017  . History of noncompliance with medical treatment 12/20/2017  . Acute sinusitis, unspecified 03/15/2017  . Rupture of right distal biceps tendon 03/01/2016  . Pain in right lower leg 01/30/2016  . Chest pain 04/26/2015  . Hypokalemia 04/26/2015  . COPD GOLD 0 / active smoker 04/26/2015  . Pain in the chest   . Fall 07/22/2014  . Multiple fractures of ribs of right side 07/22/2014  . Pneumothorax on right 07/22/2014  . Traumatic pneumohemothorax 07/21/2014  . Altered mental state 04/15/2013  . Acute encephalopathy 04/15/2013  . Difficulty speaking 04/15/2013  . Headache(784.0) 04/15/2013  . Cigarette smoker 04/15/2013  . HTN  (hypertension) 04/15/2013    Past Surgical History:  Procedure Laterality Date  . CARDIAC CATHETERIZATION    . HIP SURGERY    . KNEE SURGERY          Home Medications    Prior to Admission medications   Medication Sig Start Date End Date Taking? Authorizing Provider  acetaminophen (TYLENOL) 325 MG tablet Take 650 mg by mouth every 6 (six) hours as needed for mild pain.   Yes [provider]  albuterol (PROVENTIL HFA;VENTOLIN HFA) 108 (90 Base) MCG/ACT inhaler Inhale 1-2 puffs into the lungs every 6 (six) hours as needed for wheezing or shortness of breath. 12/22/17  Yes Bonnielee Haff, MD  budesonide-formoterol [facility]) 80-4.5 MCG/ACT inhaler Inhale 2 puffs into the lungs 2 (two) times daily. 12/22/17  Yes Bonnielee Haff, MD  ibuprofen (ADVIL,MOTRIN) 200 MG tablet Take 200 mg by mouth every 6 (six) hours as needed for mild pain.   Yes [provider]  ipratropium-albuterol (DUONEB) 0.5-2.5 (3) MG/3ML SOLN Take 3 mLs by nebulization 3 (three) times daily. 12/22/17  Yes Bonnielee Haff, MD  oxyCODONE-acetaminophen (PERCOCET) 10-325 MG tablet Take 1 tablet by mouth every 6 (six) hours as needed. 05/13/18  Yes [provider]  penicillin v potassium (VEETID) 500 MG tablet Take 500 mg by mouth every 6 (six) hours. 05/13/18  Yes [provider]  predniSONE (DELTASONE) 20 MG tablet Take 2 tablets (40 mg total)  by mouth daily with breakfast. 05/20/18   Pollina, Gwenyth Allegra, MD  traMADol (ULTRAM) 50 MG tablet Take 1 tablet (50 mg total) by mouth every 6 (six) hours as needed. 05/20/18   Orpah Greek, MD    Family History Family History  Problem Relation Age of Onset  . CAD Father   . Bone cancer Mother     Social History Social History   Tobacco Use  . Smoking status: Current Every Day Smoker    Packs/day: 1.50    Years: 28.00    Pack years: 42.00  . Smokeless tobacco: Never Used  Substance Use Topics  . Alcohol use: Yes    Comment:  on occasion  . Drug use: No     Allergies   Patient has no known allergies.   Review of Systems Review of Systems  HENT: Positive for dental problem.   Respiratory: Positive for cough and wheezing.   All other systems reviewed and are negative.    Physical Exam Updated Vital Signs BP (!) 184/110   Pulse (!) 58   Temp 98.4 F (36.9 C) (Oral)   Resp (!) 22   SpO2 98%   Physical Exam Vitals signs and nursing note reviewed.  Constitutional:      General: He is not in acute distress.    Appearance: Normal appearance. He is well-developed.  HENT:     Head: Normocephalic and atraumatic.     Right Ear: Hearing normal.     Left Ear: Hearing normal.     Nose: Nose normal.  Eyes:     Conjunctiva/sclera: Conjunctivae normal.     Pupils: Pupils are equal, round, and reactive to light.  Neck:     Musculoskeletal: Normal range of motion and neck supple.  Cardiovascular:     Rate and Rhythm: Regular rhythm.     Heart sounds: S1 normal and S2 normal. No murmur. No friction rub. No gallop.   Pulmonary:     Effort: Pulmonary effort is normal. No respiratory distress.     Breath sounds: Decreased breath sounds and wheezing present.  Chest:     Chest wall: No tenderness.  Abdominal:     General: Bowel sounds are normal.     Palpations: Abdomen is soft.     Tenderness: There is no abdominal tenderness. There is no guarding or rebound. Negative signs include Murphy's sign and McBurney's sign.     Hernia: No hernia is present.  Musculoskeletal: Normal range of motion.  Skin:    General: Skin is warm and dry.     Findings: No rash.  Neurological:     Mental Status: He is alert and oriented to person, place, and time.     GCS: GCS eye subscore is 4. GCS verbal subscore is 5. GCS motor subscore is 6.     Cranial Nerves: No cranial nerve deficit.     Sensory: No sensory deficit.     Coordination: Coordination normal.  Psychiatric:        Speech: Speech normal.        Behavior:  Behavior normal.        Thought Content: Thought content normal.      ED Treatments / Results  Labs (all labs ordered are listed, but only abnormal results are displayed) Labs Reviewed - No data to display  EKG EKG Interpretation  Date/Time:  Tuesday May 20 2018 00:13:21 EST Ventricular Rate:  58 PR Interval:  174 QRS Duration: 80 QT Interval:  440  QTC Calculation: 431 R Axis:   -88 Text Interpretation:  Sinus bradycardia Left axis deviation Abnormal ECG Similar to prior, no STEMI Confirmed by Antony Blackbird [id number]) on 05/21/2018 10:34:11 AM   Radiology Dg Chest 2 View  Result Date: 05/20/2018 CLINICAL DATA:  58 y/o  M; toothache and shortness of breath. EXAM: CHEST - 2 VIEW COMPARISON:  12/20/2017 chest radiograph FINDINGS: Stable heart size and mediastinal contours are within normal limits. Linear opacities in right mid and left lower lung zones are compatible with platelike atelectasis or scarring. No consolidation, effusion, or pneumothorax. No acute osseous abnormality is evident. IMPRESSION: No acute pulmonary process identified. Platelike atelectasis in right mid and left lower lung zones. Electronically Signed   By: Kristine Garbe M.D.   On: 05/20/2018 00:21    Procedures Dental Block Date/Time: 05/20/2018 3:49 AM Performed by: Orpah Greek, MD Authorized by: Orpah Greek, MD   Consent:    Consent obtained:  Verbal   Consent given by:  Patient   Risks discussed:  Allergic reaction, nerve damage, swelling, unsuccessful block and pain   Alternatives discussed:  Alternative treatment Universal protocol:    Procedure explained and questions answered to patient or proxy's satisfaction: yes     Site/side marked: yes     Immediately prior to procedure, a time out was called: yes     Patient identity confirmed:  Verbally with patient Indications:    Indications: dental pain   Location:    Block type:  Inferior alveolar   Laterality:   Right Procedure details (see MAR for exact dosages):    Syringe type:  Controlled syringe   Needle gauge:  27 G   Anesthetic injected:  Bupivacaine 0.5% WITH epi   Injection procedure:  Anatomic landmarks identified, introduced needle, incremental injection, negative aspiration for blood and anatomic landmarks palpated Post-procedure details:    Outcome:  Anesthesia achieved   Patient tolerance of procedure:  Tolerated well, no immediate complications   (including critical care time)  Medications Ordered in ED Medications  ipratropium-albuterol (DUONEB) 0.5-2.5 (3) MG/3ML nebulizer solution 3 mL (3 mLs Nebulization Given 05/20/18 0324)  albuterol (PROVENTIL) (2.5 MG/3ML) 0.083% nebulizer solution 2.5 mg (2.5 mg Nebulization Given 05/20/18 0324)  predniSONE (DELTASONE) tablet 60 mg (60 mg Oral Given 05/20/18 0324)     Initial Impression / Assessment and Plan / ED Course  I have reviewed the triage vital signs and the nursing notes.  Pertinent labs & imaging results that were available during my care of the patient were reviewed by me and considered in my medical decision making (see chart for details).        Patient presents to the emergency department for evaluation of dental pain.  He has been evaluated by dentist already and told that the tooth need to be extracted but he cannot afford the procedure.  Patient asking for dental block.  This was performed.  Will prescribe Ultram for pain.  Patient already on antibiotic.  Does not appear to be any active infection or dental abscess.  Patient noted to be wheezing on arrival.  He has a history of COPD.  Patient treated with albuterol, Atrovent, prednisone.  Final Clinical Impressions(s) / ED Diagnoses   Final diagnoses:  COPD exacerbation ([place])  Pain, dental    ED Discharge Orders         Ordered    traMADol (ULTRAM) 50 MG tablet  Every 6 hours PRN     05/20/18 0353  predniSONE (DELTASONE) 20 MG tablet  Daily with breakfast      05/20/18 0353           Orpah Greek, MD 05/20/18 1410    Orpah Greek, MD 06/01/18 [phone]

## 2018-05-20 NOTE — Discharge Instructions (Signed)
We cannot treat your dental pain in the emergency department.  Only a dentist can treat your dental pain.  We will not continually prescribe pain medication for the same pain.

## 2018-06-09 ENCOUNTER — Emergency Department (HOSPITAL_COMMUNITY): Payer: Self-pay

## 2018-06-09 ENCOUNTER — Inpatient Hospital Stay (HOSPITAL_COMMUNITY)
Admission: EM | Admit: 2018-06-09 | Discharge: 2018-06-14 | DRG: 193 | Disposition: A | Discharge status: Home / Self Care | Payer: Self-pay | Attending: Internal Medicine | Admitting: Internal Medicine

## 2018-06-09 ENCOUNTER — Other Ambulatory Visit: Payer: Self-pay

## 2018-06-09 DIAGNOSIS — Z7951 Long term (current) use of inhaled steroids: Secondary | ICD-10-CM

## 2018-06-09 DIAGNOSIS — I509 Heart failure, unspecified: Secondary | ICD-10-CM | POA: Diagnosis present

## 2018-06-09 DIAGNOSIS — R0603 Acute respiratory distress: Secondary | ICD-10-CM | POA: Diagnosis present

## 2018-06-09 DIAGNOSIS — J440 Chronic obstructive pulmonary disease with acute lower respiratory infection: Secondary | ICD-10-CM | POA: Diagnosis present

## 2018-06-09 DIAGNOSIS — Z8249 Family history of ischemic heart disease and other diseases of the circulatory system: Secondary | ICD-10-CM

## 2018-06-09 DIAGNOSIS — Z79891 Long term (current) use of opiate analgesic: Secondary | ICD-10-CM

## 2018-06-09 DIAGNOSIS — R630 Anorexia: Secondary | ICD-10-CM | POA: Diagnosis present

## 2018-06-09 DIAGNOSIS — J189 Pneumonia, unspecified organism: Secondary | ICD-10-CM | POA: Diagnosis present

## 2018-06-09 DIAGNOSIS — J441 Chronic obstructive pulmonary disease with (acute) exacerbation: Secondary | ICD-10-CM | POA: Diagnosis present

## 2018-06-09 DIAGNOSIS — Z20828 Contact with and (suspected) exposure to other viral communicable diseases: Secondary | ICD-10-CM

## 2018-06-09 DIAGNOSIS — J9601 Acute respiratory failure with hypoxia: Secondary | ICD-10-CM | POA: Diagnosis present

## 2018-06-09 DIAGNOSIS — I110 Hypertensive heart disease with heart failure: Secondary | ICD-10-CM | POA: Diagnosis present

## 2018-06-09 DIAGNOSIS — J449 Chronic obstructive pulmonary disease, unspecified: Secondary | ICD-10-CM | POA: Diagnosis present

## 2018-06-09 DIAGNOSIS — Z79899 Other long term (current) drug therapy: Secondary | ICD-10-CM

## 2018-06-09 DIAGNOSIS — F17210 Nicotine dependence, cigarettes, uncomplicated: Secondary | ICD-10-CM | POA: Diagnosis present

## 2018-06-09 DIAGNOSIS — J181 Lobar pneumonia, unspecified organism: Principal | ICD-10-CM | POA: Diagnosis present

## 2018-06-09 MED ORDER — MORPHINE SULFATE (PF) 4 MG/ML IV SOLN
4.0000 mg | Freq: Once | INTRAVENOUS | Status: AC
  Administered 2018-06-09: 4 mg via INTRAVENOUS
  Filled 2018-06-09: qty 1

## 2018-06-09 MED ORDER — ALBUTEROL (5 MG/ML) CONTINUOUS INHALATION SOLN
10.0000 mg/h | INHALATION_SOLUTION | RESPIRATORY_TRACT | Status: DC
  Administered 2018-06-09: 10 mg/h via RESPIRATORY_TRACT
  Filled 2018-06-09: qty 20

## 2018-06-09 NOTE — ED Provider Notes (Signed)
[facility] EMERGENCY DEPARTMENT Provider Note   CSN: [id number] Arrival date & time: 06/09/18  2255    History   Chief Complaint Chief Complaint  Patient presents with  . Respiratory Distress  . Chest Pain    HPI [name] is a 58 y.o. male.     HPI  The patient is a 58 year old male, he has a known history of COPD as well as a history of hypertension.  The patient has had severe COPD in the past and has required admission to high levels of care including ICU.  The patient states that over the last month he has had almost persistent shortness of breath and in fact review of the medical record shows an ER visit on February 24.  He was released, he states that initially he did well, he had visited [facility] approximately 9 days ago however over the last week he has had a progressive illness which left him severely short of breath and when the paramedics arrived tonight he could not talk very well because he was so dyspneic tachypneic and in severe respiratory distress.  They started albuterol continuous nebulizer, Solu-Medrol and gave magnesium which was started in the field which had minimal improvement.  On arrival the patient is now slightly improved and able to speak in 2-3 word sentences but in severe distress.  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  He denies diabetes or alcohol use.  Paramedics have very little else to add to the case as they could not get much history prehospital.  Past Medical History:  Diagnosis Date  . COPD (chronic obstructive pulmonary disease) ([place])   . Hypertension     Patient Active Problem List   Diagnosis Date Noted  . Achilles tendinitis, right leg 04/24/2018  . Acute respiratory distress 12/20/2017  . History of noncompliance with medical treatment 12/20/2017  . Acute sinusitis, unspecified  03/15/2017  . Rupture of right distal biceps tendon 03/01/2016  . Pain in right lower leg 01/30/2016  . Chest pain 04/26/2015  . Hypokalemia 04/26/2015  . COPD GOLD 0 / active smoker 04/26/2015  . Pain in the chest   . Fall 07/22/2014  . Multiple fractures of ribs of right side 07/22/2014  . Pneumothorax on right 07/22/2014  . Traumatic pneumohemothorax 07/21/2014  . Altered mental state 04/15/2013  . Acute encephalopathy 04/15/2013  . Difficulty speaking 04/15/2013  . Headache(784.0) 04/15/2013  . Cigarette smoker 04/15/2013  . HTN (hypertension) 04/15/2013    Past Surgical History:  Procedure Laterality Date  . CARDIAC CATHETERIZATION    . HIP SURGERY    . KNEE SURGERY          Home Medications    Prior to Admission medications   Medication Sig Start Date End Date Taking? Authorizing Provider  acetaminophen (TYLENOL) 325 MG tablet Take 650 mg by mouth every 6 (six) hours as needed for mild pain.   Yes [provider]  albuterol (PROVENTIL HFA;VENTOLIN HFA) 108 (90 Base) MCG/ACT inhaler Inhale 1-2 puffs into the lungs every 6 (six) hours as needed for wheezing or shortness of breath. 12/22/17  Yes Bonnielee Haff, MD  budesonide-formoterol [facility]) 80-4.5 MCG/ACT inhaler Inhale 2 puffs into the lungs 2 (two) times daily. 12/22/17  Yes Bonnielee Haff, MD  ibuprofen (ADVIL,MOTRIN) 200 MG tablet Take 200 mg by mouth every 6 (six) hours as  needed for mild pain.   Yes [provider]  ipratropium-albuterol (DUONEB) 0.5-2.5 (3) MG/3ML SOLN Take 3 mLs by nebulization 3 (three) times daily. 12/22/17  Yes Bonnielee Haff, MD  oxyCODONE-acetaminophen (PERCOCET) 10-325 MG tablet Take 1 tablet by mouth every 6 (six) hours as needed for pain.  05/13/18  Yes [provider]  traMADol (ULTRAM) 50 MG tablet Take 1 tablet (50 mg total) by mouth every 6 (six) hours as needed. Patient taking differently: Take 50 mg by mouth every 6 (six) hours as needed for moderate  pain.  05/20/18  Yes Pollina, Gwenyth Allegra, MD  predniSONE (DELTASONE) 20 MG tablet Take 2 tablets (40 mg total) by mouth daily with breakfast. Patient not taking: Reported on 06/09/2018 05/20/18   Orpah Greek, MD    Family History Family History  Problem Relation Age of Onset  . CAD Father   . Bone cancer Mother     Social History Social History   Tobacco Use  . Smoking status: Current Every Day Smoker    Packs/day: 1.50    Years: 28.00    Pack years: 42.00  . Smokeless tobacco: Never Used  Substance Use Topics  . Alcohol use: Yes    Comment: on occasion  . Drug use: No     Allergies   Patient has no known allergies.   Review of Systems Review of Systems  All other systems reviewed and are negative.    Physical Exam Updated Vital Signs BP 134/77   Pulse 73   Resp 14   Ht 1.88 m (6\' 2" )   Wt 123.6 kg   SpO2 100%   BMI 34.99 kg/m   Physical Exam Vitals signs and nursing note reviewed.  Constitutional:      General: He is in acute distress.     Appearance: He is well-developed. He is ill-appearing, toxic-appearing and diaphoretic.  HENT:     Head: Normocephalic and atraumatic.     Mouth/Throat:     Pharynx: No oropharyngeal exudate.  Eyes:     General: No scleral icterus.       Right eye: No discharge.        Left eye: No discharge.     Conjunctiva/sclera: Conjunctivae normal.     Pupils: Pupils are equal, round, and reactive to light.  Neck:     Musculoskeletal: Normal range of motion and neck supple.     Thyroid: No thyromegaly.     Vascular: No JVD.  Cardiovascular:     Rate and Rhythm: Regular rhythm. Tachycardia present.     Heart sounds: Normal heart sounds. No murmur. No friction rub. No gallop.   Pulmonary:     Effort: Respiratory distress present.     Breath sounds: Wheezing and rhonchi present. No rales.  Abdominal:     General: Bowel sounds are normal. There is no distension.     Palpations: Abdomen is soft. There is no  mass.     Tenderness: There is no abdominal tenderness.  Musculoskeletal: Normal range of motion.        General: No tenderness.  Lymphadenopathy:     Cervical: No cervical adenopathy.  Skin:    General: Skin is warm.     Findings: No erythema or rash.  Neurological:     General: No focal deficit present.     Mental Status: He is alert.     Coordination: Coordination normal.  Psychiatric:        Behavior: Behavior normal.  ED Treatments / Results  Labs (all labs ordered are listed, but only abnormal results are displayed) Labs Reviewed  COMPREHENSIVE METABOLIC PANEL - Abnormal; Notable for the following components:      Result Value   CO2 18 (*)    Glucose, Bld 191 (*)    All other components within normal limits  CULTURE, BLOOD (ROUTINE X 2)  CULTURE, BLOOD (ROUTINE X 2)  CBC WITH DIFFERENTIAL/PLATELET  TROPONIN I  BRAIN NATRIURETIC PEPTIDE    EKG EKG Interpretation  Date/Time:  Monday June 09 2018 23:27:46 EDT Ventricular Rate:  80 PR Interval:    QRS Duration: 91 QT Interval:  404 QTC Calculation: 466 R Axis:   -33 Text Interpretation:  Sinus rhythm Left axis deviation since last tracing no significant change Confirmed by Noemi Chapel [phone]) on 06/09/2018 11:45:35 PM   Radiology Dg Chest Port 1 View  Result Date: 06/09/2018 CLINICAL DATA:  Wheezing, cough, shortness of breath EXAM: PORTABLE CHEST 1 VIEW COMPARISON:  None. FINDINGS: Heart is borderline in size. Bibasilar airspace opacities. No visible significant effusions. No acute bony abnormality. IMPRESSION: Bibasilar atelectasis or infiltrates. Electronically Signed   By: Rolm Baptise M.D.   On: 06/09/2018 23:37    Procedures .Critical Care Performed by: Noemi Chapel, MD Authorized by: Noemi Chapel, MD   Critical care provider statement:    Critical care time (minutes):  35   Critical care time was exclusive of:  Separately billable procedures and treating other patients and teaching time    Critical care was necessary to treat or prevent imminent or life-threatening deterioration of the following conditions:  Respiratory failure   Critical care was time spent personally by me on the following activities:  Blood draw for specimens, development of treatment plan with patient or surrogate, discussions with consultants, evaluation of patient's response to treatment, examination of patient, obtaining history from patient or surrogate, ordering and performing treatments and interventions, ordering and review of laboratory studies, ordering and review of radiographic studies, pulse oximetry, re-evaluation of patient's condition and review of old charts   (including critical care time)  Medications Ordered in ED Medications  albuterol (PROVENTIL,VENTOLIN) solution continuous neb (10 mg/hr Nebulization New Bag/Given 06/09/18 2341)  cefTRIAXone (ROCEPHIN) 2 g in sodium chloride 0.9 % 100 mL IVPB (has no administration in time range)  azithromycin (ZITHROMAX) 500 mg in sodium chloride 0.9 % 250 mL IVPB (has no administration in time range)  morphine 4 MG/ML injection 4 mg (4 mg Intravenous Given 06/09/18 2338)     Initial Impression / Assessment and Plan / ED Course  I have reviewed the triage vital signs and the nursing notes.  Pertinent labs & imaging results that were available during my care of the patient were reviewed by me and considered in my medical decision making (see chart for details).  Clinical Course as of Jun 10 31  Mon Jun 09, 2018  2345 The patient has a normal white blood cell count at 7000, I have personally looked at the x-ray, this AP view shows that the patient has bilateral atelectasis or infiltrates at the bases.  There is no pneumothorax, the lungs seem to be somewhat hyperexpanded consistent with his COPD and severe respiratory distress.  The patient is tolerating BiPAP at this time and seems to be improving.  He is getting continuous nebulizers.  He continues to be  critically ill but improving.   [BM]    Clinical Course User Index [BM] Noemi Chapel, MD  The pt is in respiratory distress, severe Has ongoing tachypnea, has diffuse wheezing, can barely speak in 2-3 word sentences, has a known history of severe COPD and is currently getting magnesium as well as a continuous neb.  He does agree to being intubated if needed, he agrees to BiPAP if needed however at this time we will give him a trial of continuous nebulizer.  X-ray pending, labs pending, the patient is critically ill.  The labs show that the patient has a normal metabolic panel except for a slightly low CO2, normal CBC, normal troponin, BNP is pending.  X-ray shows bilateral atelectasis or infiltrates.  He has no fever, his tachycardia improved, oxygenation is doing good on BiPAP, he will need to be admitted to the hospital, high level of care.  Admitting team paged.  Discussed with Dr. Randal Buba who will assume care of the change of shift.  Final Clinical Impressions(s) / ED Diagnoses   Final diagnoses:  Acute respiratory failure with hypoxia ([place])      Noemi Chapel, MD 06/10/18 [phone]

## 2018-06-09 NOTE — ED Triage Notes (Signed)
BIB [facility] from home. Pt been sick X 1 week. Wheezing heard in all lung fields. Received 10 mg albuterol, .5 mg atrovent, 125 solu-medrol, and 2 nitro PTA. 2g Mag infusing on arrival.

## 2018-06-10 ENCOUNTER — Emergency Department (HOSPITAL_COMMUNITY): Payer: Self-pay

## 2018-06-10 DIAGNOSIS — J441 Chronic obstructive pulmonary disease with (acute) exacerbation: Secondary | ICD-10-CM

## 2018-06-10 DIAGNOSIS — J9601 Acute respiratory failure with hypoxia: Secondary | ICD-10-CM

## 2018-06-10 DIAGNOSIS — J189 Pneumonia, unspecified organism: Secondary | ICD-10-CM | POA: Diagnosis present

## 2018-06-10 DIAGNOSIS — J181 Lobar pneumonia, unspecified organism: Principal | ICD-10-CM

## 2018-06-10 DIAGNOSIS — R0603 Acute respiratory distress: Secondary | ICD-10-CM

## 2018-06-10 LAB — RESPIRATORY PANEL BY PCR
Adenovirus: NOT DETECTED
Bordetella pertussis: NOT DETECTED
CORONAVIRUS 229E-RVPPCR: NOT DETECTED
CORONAVIRUS HKU1-RVPPCR: NOT DETECTED
Chlamydophila pneumoniae: NOT DETECTED
Coronavirus NL63: NOT DETECTED
Coronavirus OC43: NOT DETECTED
INFLUENZA B-RVPPCR: NOT DETECTED
Influenza A: NOT DETECTED
Metapneumovirus: NOT DETECTED
Mycoplasma pneumoniae: NOT DETECTED
Parainfluenza Virus 1: NOT DETECTED
Parainfluenza Virus 2: NOT DETECTED
Parainfluenza Virus 3: NOT DETECTED
Parainfluenza Virus 4: NOT DETECTED
RESPIRATORY SYNCYTIAL VIRUS-RVPPCR: NOT DETECTED
Rhinovirus / Enterovirus: NOT DETECTED

## 2018-06-10 LAB — COMPREHENSIVE METABOLIC PANEL
ALBUMIN: 3.9 g/dL (ref 3.5–5.0)
ALT: 25 U/L (ref 0–44)
AST: 21 U/L (ref 15–41)
Alkaline Phosphatase: 66 U/L (ref 38–126)
Anion gap: 14 (ref 5–15)
BUN: 7 mg/dL (ref 6–20)
CO2: 18 mmol/L — ABNORMAL LOW (ref 22–32)
Calcium: 9.2 mg/dL (ref 8.9–10.3)
Chloride: 107 mmol/L (ref 98–111)
Creatinine, Ser: 0.99 mg/dL (ref 0.61–1.24)
GFR calc Af Amer: >60 mL/min (ref >60)
GFR calc non Af Amer: >60 mL/min (ref >60)
GLUCOSE: 191 mg/dL — AB (ref 70–99)
Potassium: 3.5 mmol/L (ref 3.5–5.1)
SODIUM: 139 mmol/L (ref 135–145)
Total Bilirubin: 0.7 mg/dL (ref 0.3–1.2)
Total Protein: 7 g/dL (ref 6.5–8.1)

## 2018-06-10 LAB — CBC WITH DIFFERENTIAL/PLATELET
Abs Immature Granulocytes: 0.01 10*3/uL (ref 0.00–0.07)
BASOS PCT: 1 %
Basophils Absolute: 0 10*3/uL (ref 0.0–0.1)
Eosinophils Absolute: 0.2 10*3/uL (ref 0.0–0.5)
Eosinophils Relative: 3 %
HEMATOCRIT: 49.3 % (ref 39.0–52.0)
Hemoglobin: 15.7 g/dL (ref 13.0–17.0)
Immature Granulocytes: 0 %
Lymphocytes Relative: 55 %
Lymphs Abs: 3.9 10*3/uL (ref 0.7–4.0)
MCH: 28.4 pg (ref 26.0–34.0)
MCHC: 31.8 g/dL (ref 30.0–36.0)
MCV: 89.3 fL (ref 80.0–100.0)
MONOS PCT: 5 %
Monocytes Absolute: 0.4 10*3/uL (ref 0.1–1.0)
Neutro Abs: 2.5 10*3/uL (ref 1.7–7.7)
Neutrophils Relative %: 36 %
Platelets: 272 10*3/uL (ref 150–400)
RBC: 5.52 MIL/uL (ref 4.22–5.81)
RDW: 14 % (ref 11.5–15.5)
WBC: 7 10*3/uL (ref 4.0–10.5)
nRBC: 0 % (ref 0.0–0.2)

## 2018-06-10 LAB — PROCALCITONIN: Procalcitonin: <0.1 ng/mL

## 2018-06-10 LAB — GLUCOSE, CAPILLARY: Glucose-Capillary: 171 mg/dL — ABNORMAL HIGH (ref 70–99)

## 2018-06-10 LAB — TROPONIN I: Troponin I: <0.03 ng/mL (ref <0.03)

## 2018-06-10 LAB — BRAIN NATRIURETIC PEPTIDE: B Natriuretic Peptide: 15.5 pg/mL (ref 0.0–100.0)

## 2018-06-10 LAB — INFLUENZA PANEL BY PCR (TYPE A & B)
Influenza A By PCR: NEGATIVE
Influenza B By PCR: NEGATIVE

## 2018-06-10 LAB — MRSA PCR SCREENING: MRSA by PCR: NEGATIVE

## 2018-06-10 LAB — HIV ANTIBODY (ROUTINE TESTING W REFLEX): HIV Screen 4th Generation wRfx: NONREACTIVE

## 2018-06-10 MED ORDER — SODIUM CHLORIDE 0.9 % IV SOLN
2.0000 g | INTRAVENOUS | Status: DC
  Administered 2018-06-10 – 2018-06-12 (×4): 2 g via INTRAVENOUS
  Filled 2018-06-10 (×6): qty 20

## 2018-06-10 MED ORDER — IPRATROPIUM-ALBUTEROL 0.5-2.5 (3) MG/3ML IN SOLN
3.0000 mL | Freq: Four times a day (QID) | RESPIRATORY_TRACT | Status: DC
  Filled 2018-06-10: qty 3

## 2018-06-10 MED ORDER — ALBUTEROL SULFATE HFA 108 (90 BASE) MCG/ACT IN AERS: 1.0000 | INHALATION_SPRAY | Freq: Four times a day (QID) | RESPIRATORY_TRACT | Status: DC | PRN

## 2018-06-10 MED ORDER — UMECLIDINIUM BROMIDE 62.5 MCG/INH IN AEPB
1.0000 | INHALATION_SPRAY | Freq: Every day | RESPIRATORY_TRACT | Status: DC
  Filled 2018-06-10: qty 7

## 2018-06-10 MED ORDER — IPRATROPIUM-ALBUTEROL 20-100 MCG/ACT IN AERS
2.0000 | INHALATION_SPRAY | Freq: Four times a day (QID) | RESPIRATORY_TRACT | Status: DC
  Filled 2018-06-10: qty 4

## 2018-06-10 MED ORDER — ENOXAPARIN SODIUM 40 MG/0.4ML ~~LOC~~ SOLN
40.0000 mg | Freq: Every day | SUBCUTANEOUS | Status: DC
  Administered 2018-06-10: 40 mg via SUBCUTANEOUS
  Filled 2018-06-10: qty 0.4

## 2018-06-10 MED ORDER — MOMETASONE FURO-FORMOTEROL FUM 100-5 MCG/ACT IN AERO
2.0000 | INHALATION_SPRAY | Freq: Two times a day (BID) | RESPIRATORY_TRACT | Status: DC
  Administered 2018-06-10 – 2018-06-14 (×7): 2 via RESPIRATORY_TRACT
  Filled 2018-06-10 (×2): qty 8.8

## 2018-06-10 MED ORDER — ALBUTEROL SULFATE (2.5 MG/3ML) 0.083% IN NEBU: 2.5000 mg | INHALATION_SOLUTION | RESPIRATORY_TRACT | Status: DC | PRN

## 2018-06-10 MED ORDER — SODIUM CHLORIDE 0.9 % IV SOLN
500.0000 mg | INTRAVENOUS | Status: DC
  Administered 2018-06-10 – 2018-06-12 (×4): 500 mg via INTRAVENOUS
  Filled 2018-06-10 (×7): qty 500

## 2018-06-10 MED ORDER — ENOXAPARIN SODIUM 60 MG/0.6ML ~~LOC~~ SOLN
60.0000 mg | Freq: Every day | SUBCUTANEOUS | Status: DC
  Administered 2018-06-11 – 2018-06-13 (×3): 60 mg via SUBCUTANEOUS
  Filled 2018-06-10 (×4): qty 0.6

## 2018-06-10 MED ORDER — UMECLIDINIUM BROMIDE 62.5 MCG/INH IN AEPB
1.0000 | INHALATION_SPRAY | Freq: Every day | RESPIRATORY_TRACT | Status: DC
  Administered 2018-06-10 – 2018-06-14 (×5): 1 via RESPIRATORY_TRACT
  Filled 2018-06-10: qty 7

## 2018-06-10 MED ORDER — METHYLPREDNISOLONE SODIUM SUCC 125 MG IJ SOLR
60.0000 mg | Freq: Three times a day (TID) | INTRAMUSCULAR | Status: DC
  Administered 2018-06-10 – 2018-06-14 (×13): 60 mg via INTRAVENOUS
  Filled 2018-06-10 (×13): qty 2

## 2018-06-10 MED ORDER — MORPHINE SULFATE (PF) 2 MG/ML IV SOLN
2.0000 mg | INTRAVENOUS | Status: DC | PRN
  Administered 2018-06-10 (×2): 4 mg via INTRAVENOUS
  Administered 2018-06-11 – 2018-06-13 (×7): 2 mg via INTRAVENOUS
  Administered 2018-06-13: 4 mg via INTRAVENOUS
  Administered 2018-06-13: 2 mg via INTRAVENOUS
  Administered 2018-06-14: 4 mg via INTRAVENOUS
  Filled 2018-06-10 (×2): qty 2
  Filled 2018-06-10 (×2): qty 1
  Filled 2018-06-10: qty 2
  Filled 2018-06-10 (×2): qty 1
  Filled 2018-06-10: qty 2
  Filled 2018-06-10 (×4): qty 1

## 2018-06-10 MED ORDER — ACETAMINOPHEN 325 MG PO TABS
650.0000 mg | ORAL_TABLET | Freq: Four times a day (QID) | ORAL | Status: DC | PRN
  Administered 2018-06-10 – 2018-06-11 (×2): 650 mg via ORAL
  Filled 2018-06-10 (×2): qty 2

## 2018-06-10 MED ORDER — ALBUTEROL SULFATE HFA 108 (90 BASE) MCG/ACT IN AERS
1.0000 | INHALATION_SPRAY | Freq: Four times a day (QID) | RESPIRATORY_TRACT | Status: DC | PRN
  Filled 2018-06-10: qty 6.7

## 2018-06-10 MED ORDER — SODIUM CHLORIDE 0.9 % IV SOLN
INTRAVENOUS | Status: DC
  Administered 2018-06-10: 125 mL/h via INTRAVENOUS
  Administered 2018-06-10: 17:00:00 via INTRAVENOUS
  Administered 2018-06-11: 1000 mL via INTRAVENOUS
  Administered 2018-06-11 – 2018-06-13 (×2): via INTRAVENOUS

## 2018-06-10 MED ORDER — PREDNISONE 20 MG PO TABS
40.0000 mg | ORAL_TABLET | Freq: Every day | ORAL | Status: DC
  Administered 2018-06-10: 40 mg via ORAL
  Filled 2018-06-10: qty 2

## 2018-06-10 MED ORDER — ALBUTEROL SULFATE HFA 108 (90 BASE) MCG/ACT IN AERS
1.0000 | INHALATION_SPRAY | Freq: Four times a day (QID) | RESPIRATORY_TRACT | Status: DC
  Administered 2018-06-10 – 2018-06-11 (×5): 2 via RESPIRATORY_TRACT
  Administered 2018-06-11: 1 via RESPIRATORY_TRACT
  Administered 2018-06-11 – 2018-06-12 (×4): 2 via RESPIRATORY_TRACT

## 2018-06-10 MED ORDER — MOMETASONE FURO-FORMOTEROL FUM 200-5 MCG/ACT IN AERO
1.0000 | INHALATION_SPRAY | Freq: Two times a day (BID) | RESPIRATORY_TRACT | Status: DC
  Filled 2018-06-10: qty 8.8

## 2018-06-10 NOTE — H&P (Signed)
History and Physical    [name] STM:[id number] DOB: Sep 06, 1960 DOA: 06/09/2018  PCP: [name], MD  Patient coming from: Home  I have personally briefly reviewed patient's old medical records in [place]  Chief Complaint: Respiratory distress  HPI: [name] is a 58 y.o. male with medical history significant of COPD, HTN.  Not on home O2.  Patient has been feeling ill this past week with cough productive of yellow sputum, fever at home up to 101.x, wheezing.  His only recent travel was to [facility] approximately 9 days ago.  Today EMS called due to respiratory distress.   They started albuterol continuous nebulizer, Solu-Medrol and gave magnesium which was started in the field which had minimal improvement.   ED Course: Patient put on hour long neb followed by rescue BIPAP.  A.Febrile in ED.  Trop neg.  CP is pleuritic (much worse after coughing).  BNP neg.  CXR showed bilateral lower lobe actelectasis vs PNA.  He was started on rocephin / azithro.  CT chest also shows bilateral lower lobe actelectasis vs PNA.  Influenza PCR is negative.   Review of Systems: As per HPI otherwise 10 point review of systems negative.   Past Medical History:  Diagnosis Date   COPD (chronic obstructive pulmonary disease) ([place])    Hypertension     Past Surgical History:  Procedure Laterality Date   CARDIAC CATHETERIZATION     HIP SURGERY     KNEE SURGERY       reports that he has been smoking. He has a 42.00 pack-year smoking history. He has never used smokeless tobacco. He reports current alcohol use. He reports that he does not use drugs.  No Known Allergies  Family History  Problem Relation Age of Onset   CAD Father    Bone cancer Mother      Prior to Admission medications   Medication Sig Start Date End Date Taking? Authorizing Provider  acetaminophen (TYLENOL) 325 MG tablet Take 650 mg by mouth every 6 (six) hours as needed  for mild pain.   Yes [provider]  albuterol (PROVENTIL HFA;VENTOLIN HFA) 108 (90 Base) MCG/ACT inhaler Inhale 1-2 puffs into the lungs every 6 (six) hours as needed for wheezing or shortness of breath. 12/22/17  Yes Bonnielee Haff, MD  budesonide-formoterol [facility]) 80-4.5 MCG/ACT inhaler Inhale 2 puffs into the lungs 2 (two) times daily. 12/22/17  Yes Bonnielee Haff, MD  ibuprofen (ADVIL,MOTRIN) 200 MG tablet Take 200 mg by mouth every 6 (six) hours as needed for mild pain.   Yes [provider]  ipratropium-albuterol (DUONEB) 0.5-2.5 (3) MG/3ML SOLN Take 3 mLs by nebulization 3 (three) times daily. 12/22/17  Yes Bonnielee Haff, MD  oxyCODONE-acetaminophen (PERCOCET) 10-325 MG tablet Take 1 tablet by mouth every 6 (six) hours as needed for pain.  05/13/18  Yes [provider]  traMADol (ULTRAM) 50 MG tablet Take 1 tablet (50 mg total) by mouth every 6 (six) hours as needed. Patient taking differently: Take 50 mg by mouth every 6 (six) hours as needed for moderate pain.  05/20/18  Yes Pollina, Gwenyth Allegra, MD  predniSONE (DELTASONE) 20 MG tablet Take 2 tablets (40 mg total) by mouth daily with breakfast. Patient not taking: Reported on 06/09/2018 05/20/18   Orpah Greek, MD    Physical Exam: Vitals:   06/10/18 0215 06/10/18 0230 06/10/18 0245 06/10/18 0300  BP: 122/77 137/81 (!) 144/70 107/67  Pulse: 77 87 82 74  Resp: (!) 26 (!) 32 (!) 28 16  Temp:      TempSrc:      SpO2: 100% 100% 100% 98%  Weight:      Height:        Constitutional: NAD, calm, comfortable Eyes: PERRL, lids and conjunctivae normal ENMT: Mucous membranes are moist. Posterior pharynx clear of any exudate or lesions.Normal dentition.  Neck: normal, supple, no masses, no thyromegaly Respiratory: Diffuse wheezing Cardiovascular: Regular rate and rhythm, no murmurs / rubs / gallops. No extremity edema. 2+ pedal pulses. No carotid bruits.  Abdomen: no tenderness, no masses  palpated. No hepatosplenomegaly. Bowel sounds positive.  Musculoskeletal: no clubbing / cyanosis. No joint deformity upper and lower extremities. Good ROM, no contractures. Normal muscle tone.  Skin: no rashes, lesions, ulcers. No induration Neurologic: CN 2-12 grossly intact. Sensation intact, DTR normal. Strength 5/5 in all 4.  Psychiatric: Normal judgment and insight. Alert and oriented x 3. Normal mood.  Date: @TODAY @ 4:01 AM  Patient Isolation: Airborne HCP PPE: wearing N95 mask wearing gloves wearing eye protection Patient PPE: None: on BIPAP   Labs on Admission: I have personally reviewed following labs and imaging studies  CBC: Recent Labs  Lab 06/09/18 2300  WBC 7.0  NEUTROABS 2.5  HGB 15.7  HCT 49.3  MCV 89.3  PLT 476   Basic Metabolic Panel: Recent Labs  Lab 06/09/18 2300  NA 139  K 3.5  CL 107  CO2 18*  GLUCOSE 191*  BUN 7  CREATININE 0.99  CALCIUM 9.2   GFR: Estimated Creatinine Clearance: 113.7 mL/min (by C-G formula based on SCr of 0.99 mg/dL). Liver Function Tests: Recent Labs  Lab 06/09/18 2300  AST 21  ALT 25  ALKPHOS 66  BILITOT 0.7  PROT 7.0  ALBUMIN 3.9   No results for input(s): LIPASE, AMYLASE in the last 168 hours. No results for input(s): AMMONIA in the last 168 hours. Coagulation Profile: No results for input(s): INR, PROTIME in the last 168 hours. Cardiac Enzymes: Recent Labs  Lab 06/09/18 2300  TROPONINI <0.03   BNP (last 3 results) No results for input(s): PROBNP in the last 8760 hours. HbA1C: No results for input(s): HGBA1C in the last 72 hours. CBG: No results for input(s): GLUCAP in the last 168 hours. Lipid Profile: No results for input(s): CHOL, HDL, LDLCALC, TRIG, CHOLHDL, LDLDIRECT in the last 72 hours. Thyroid Function Tests: No results for input(s): TSH, T4TOTAL, FREET4, T3FREE, THYROIDAB in the last 72 hours. Anemia Panel: No results for input(s): VITAMINB12, FOLATE, FERRITIN, TIBC, IRON, RETICCTPCT in  the last 72 hours. Urine analysis:    Component Value Date/Time   COLORURINE COLORLESS (A) 11/05/2016 2314   APPEARANCEUR CLEAR 11/05/2016 2314   LABSPEC 1.000 (L) 11/05/2016 2314   PHURINE 7.0 11/05/2016 2314   GLUCOSEU NEGATIVE 11/05/2016 2314   HGBUR NEGATIVE 11/05/2016 2314   BILIRUBINUR NEGATIVE 11/05/2016 2314   KETONESUR NEGATIVE 11/05/2016 2314   PROTEINUR NEGATIVE 11/05/2016 2314   UROBILINOGEN 1.0 07/21/2014 1514   NITRITE NEGATIVE 11/05/2016 2314   LEUKOCYTESUR NEGATIVE 11/05/2016 2314    Radiological Exams on Admission: Ct Chest Wo Contrast  Result Date: 06/10/2018 CLINICAL DATA:  Respiratory distress EXAM: CT CHEST WITHOUT CONTRAST TECHNIQUE: Multidetector CT imaging of the chest was performed following the standard protocol without IV contrast. COMPARISON:  Chest x-ray 06/09/2018 FINDINGS: Cardiovascular: Heart is normal size.  Aorta is normal caliber. Mediastinum/Nodes: No mediastinal, hilar, or axillary adenopathy. Lungs/Pleura: Bilateral lower lobe airspace opacities are present and are  predominantly platelike, favor atelectasis although pneumonia cannot be completely excluded in the lung bases. No effusions. Upper Abdomen: Imaging into the upper abdomen shows no acute findings. Musculoskeletal: Chest wall soft tissues are unremarkable. No acute bony abnormality. IMPRESSION: Bilateral lower lobe opacities, predominantly platelike, favor atelectasis, but it is difficult to completely exclude pneumonia. Electronically Signed   By: Rolm Baptise M.D.   On: 06/10/2018 02:30   Dg Chest Port 1 View  Result Date: 06/09/2018 CLINICAL DATA:  Wheezing, cough, shortness of breath EXAM: PORTABLE CHEST 1 VIEW COMPARISON:  None. FINDINGS: Heart is borderline in size. Bibasilar airspace opacities. No visible significant effusions. No acute bony abnormality. IMPRESSION: Bibasilar atelectasis or infiltrates. Electronically Signed   By: Rolm Baptise M.D.   On: 06/09/2018 23:37    EKG:  Independently reviewed.  Assessment/Plan Principal Problem:   Community acquired bilateral lower lobe pneumonia ([place]) Active Problems:   COPD GOLD 0 / active smoker   Acute respiratory distress   Acute respiratory failure with hypoxia (H[facility])   COPD with acute exacerbation ([place])    1. BLL CAP - 1. PNA pathway 2. Rocephin / azithro 3. Influenza PCR neg 4. RVP pending 5. Spoke with infection control given current COVID pandemic, though admittedly seems a little less likely in this case given unimpressive CT findings despite 1 week of respiratory symptoms: 1. Go ahead and order COVID testing 2. Go ahead and order airborne isolation Room for the moment. 6. Family instructed to self isolate for next few days pending results of testing, family is asymptomatic and patient without known sick contacts. 2. COPD exacerbation - with associated acute resp failure with hypoxia 1. COPD pathway 2. Scheduled LABA/inh steroid 3. Scheduled LAMA 4. Prednisone daily given diffuse wheezing 5. PRN albuterol 6. Cont pulse ox and tele monitor  DVT prophylaxis: Lovenox Code Status: Full Family Communication: Family at bedside - discussed all of above including need to self isolate for next few days while we await COVID testing response Disposition Plan: Home after admit Consults called: None Admission status: Admit to inpatient  Severity of Illness: The appropriate patient status for this patient is INPATIENT. Inpatient status is judged to be reasonable and necessary in order to provide the required intensity of service to ensure the patient's safety. The patient's presenting symptoms, physical exam findings, and initial radiographic and laboratory data in the context of their chronic comorbidities is felt to place them at high risk for further clinical deterioration. Furthermore, it is not anticipated that the patient will be medically stable for discharge from the hospital within 2 midnights of admission.  The following factors support the patient status of inpatient.   " The patient's presenting symptoms include Respiratory distress, reports of fever 101 at home this past week. " The worrisome physical exam findings include Respiratory distress, wheezing, requiring BIPAP, productive cough. " The initial radiographic and laboratory data are worrisome because of bilateral lower lobe PNA vs actelectasis. " The chronic co-morbidities include COPD.   * I certify that at the point of admission it is my clinical judgment that the patient will require inpatient hospital care spanning beyond 2 midnights from the point of admission due to high intensity of service, high risk for further deterioration and high frequency of surveillance required.*    Shauna Bodkins M. DO [facility]  How to contact the [facility] Attending or Consulting provider [place] or covering provider during after hours [place], for this patient?  1. Check the care team  in [facility] and look for a) attending/consulting TR[facility] provider listed and b) the [facility] team listed 2. Log into [email or web address]  Amion Physician Scheduling and messaging for groups and whole hospitals  On call and physician scheduling software for group practices, residents, hospitalists and other medical providers for call, clinic, rotation and shift schedules. OnCall [employer] is a hospital-wide system for scheduling doctors and paging doctors on call. EasyPlot is for scientific plotting and data analysis.  [email or web address]  and use [place]'s universal password to access. If you do not have the password, please contact the hospital operator.  3. Locate the [facility] provider you are looking for under [facility] and page to a number that you can be directly reached. 4. If you still have difficulty reaching the provider, please page the [facility] (Director on Call) for the Hospitalists listed on amion for assistance.  06/10/2018, 3:36 AM

## 2018-06-10 NOTE — Progress Notes (Signed)
RT to pt's room for BIPAP alarming. Pt coughing with increased exp wheeze noted throughout as well as upper airway wheeze. Mask removed for pt to spit. Pt spit up large amount of thick, yellow mucus. BIPAP mask placed back on pt, wheezing improved, and pt was able to lay back and appeared more comfortable. RT will continue to monitor.

## 2018-06-10 NOTE — ED Notes (Signed)
ED TO INPATIENT HANDOFF REPORT  ED Nurse Name and Phone #: Suezanne Jacquet [phone]  S Name/Age/Gender [name] 58 y.o. male Room/Bed: 019C/019C  Code Status   Code Status: Full Code  Home/SNF/Other Home Patient oriented to: self, place, time and situation Is this baseline? Yes   Triage Complete: Triage complete  Chief Complaint Resp Distress/Chest Pain  Triage Note BIB [facility] from home. Pt been sick X 1 week. Wheezing heard in all lung fields. Received 10 mg albuterol, .5 mg atrovent, 125 solu-medrol, and 2 nitro PTA. 2g Mag infusing on arrival.    Allergies No Known Allergies  Level of Care/Admitting Diagnosis ED Disposition    ED Disposition Condition [facility]: [place] [[id number]]  Level of Care: Progressive [102]  Diagnosis: Acute respiratory failure with hypoxia [facility]) [[id number]]  Admitting Physician: Doreatha Massed  Attending Physician: Etta Quill [phone]  Estimated length of stay: past midnight tomorrow  Certification:: I certify this patient will need inpatient services for at least 2 midnights  Bed request comments: COVID ruleout case  PT Class (Do Not Modify): Inpatient [101]  PT Acc Code (Do Not Modify): Private [1]       B Medical/Surgery History Past Medical History:  Diagnosis Date  . COPD (chronic obstructive pulmonary disease) ([place])   . Hypertension    Past Surgical History:  Procedure Laterality Date  . CARDIAC CATHETERIZATION    . HIP SURGERY    . KNEE SURGERY       A IV Location/Drains/Wounds Patient Lines/Drains/Airways Status   Active Line/Drains/Airways    Name:   Placement date:   Placement time:   Site:   Days:   Peripheral IV 06/09/18 Left Antecubital   06/09/18    2328    Antecubital   1   Peripheral IV 06/09/18 Right Forearm   06/09/18    2329    Forearm   1   Peripheral IV 06/09/18 Left Hand   06/09/18    2329    Hand   1          Intake/Output Last 24 hours No  intake or output data in the 24 hours ending 06/10/18 0435  Labs/Imaging Results for orders placed or performed during the hospital encounter of 06/09/18 (from the past 48 hour(s))  CBC with Differential/Platelet     Status: None   Collection Time: 06/09/18 11:00 PM  Result Value Ref Range   WBC 7.0 4.0 - 10.5 K/uL   RBC 5.52 4.22 - 5.81 MIL/uL   Hemoglobin 15.7 13.0 - 17.0 g/dL   HCT 49.3 39.0 - 52.0 %   MCV 89.3 80.0 - 100.0 fL   MCH 28.4 26.0 - 34.0 pg   MCHC 31.8 30.0 - 36.0 g/dL   RDW 14.0 11.5 - 15.5 %   Platelets 272 150 - 400 K/uL   nRBC 0.0 0.0 - 0.2 %   Neutrophils Relative % 36 %   Neutro Abs 2.5 1.7 - 7.7 K/uL   Lymphocytes Relative 55 %   Lymphs Abs 3.9 0.7 - 4.0 K/uL   Monocytes Relative 5 %   Monocytes Absolute 0.4 0.1 - 1.0 K/uL   Eosinophils Relative 3 %   Eosinophils Absolute 0.2 0.0 - 0.5 K/uL   Basophils Relative 1 %   Basophils Absolute 0.0 0.0 - 0.1 K/uL   WBC Morphology See Note     Comment: Abnormal Lymphocytes Present   Immature Granulocytes 0 %  Abs Immature Granulocytes 0.01 0.00 - 0.07 K/uL   Tear Drop Cells PRESENT     Comment: Performed at [facility], [place] [address], [place], [place] [address]  Comprehensive metabolic panel     Status: Abnormal   Collection Time: 06/09/18 11:00 PM  Result Value Ref Range   Sodium 139 135 - 145 mmol/L   Potassium 3.5 3.5 - 5.1 mmol/L   Chloride 107 98 - 111 mmol/L   CO2 18 (L) 22 - 32 mmol/L   Glucose, Bld 191 (H) 70 - 99 mg/dL   BUN 7 6 - 20 mg/dL   Creatinine, Ser 0.99 0.61 - 1.24 mg/dL   Calcium 9.2 8.9 - 10.3 mg/dL   Total Protein 7.0 6.5 - 8.1 g/dL   Albumin 3.9 3.5 - 5.0 g/dL   AST 21 15 - 41 U/L   ALT 25 0 - 44 U/L   Alkaline Phosphatase 66 38 - 126 U/L   Total Bilirubin 0.7 0.3 - 1.2 mg/dL   GFR calc non Af Amer >60 >60 mL/min   GFR calc Af Amer >60 >60 mL/min   Anion gap 14 5 - 15    Comment: Performed at [facility], [place] [address], [place], [place] [address]  Troponin I -  ONCE - STAT     Status: None   Collection Time: 06/09/18 11:00 PM  Result Value Ref Range   Troponin I <0.03 <0.03 ng/mL    Comment: Performed at [facility] [address], [place], [place] [address]  Brain natriuretic peptide     Status: None   Collection Time: 06/09/18 11:00 PM  Result Value Ref Range   B Natriuretic Peptide 15.5 0.0 - 100.0 pg/mL    Comment: Performed at [facility] [address], [place], [place] [address]  Influenza panel by PCR (type A & B)     Status: None   Collection Time: 06/10/18  1:36 AM  Result Value Ref Range   Influenza A By PCR NEGATIVE NEGATIVE   Influenza B By PCR NEGATIVE NEGATIVE    Comment: (NOTE) The Xpert Xpress Flu assay is intended as an aid in the diagnosis of  influenza and should not be used as a sole basis for treatment.  This  assay is FDA approved for nasopharyngeal swab specimens only. Nasal  washings and aspirates are unacceptable for Xpert Xpress Flu testing. Performed at [facility], [place] [address] [place], [place] [address]   Respiratory Panel by PCR     Status: None   Collection Time: 06/10/18  1:36 AM  Result Value Ref Range   Adenovirus NOT DETECTED NOT DETECTED   Coronavirus 229E NOT DETECTED NOT DETECTED    Comment: (NOTE) The Coronavirus on the Respiratory Panel, DOES NOT test for the novel  Coronavirus (2019 nCoV)    Coronavirus HKU1 NOT DETECTED NOT DETECTED   Coronavirus NL63 NOT DETECTED NOT DETECTED   Coronavirus OC43 NOT DETECTED NOT DETECTED   Metapneumovirus NOT DETECTED NOT DETECTED   Rhinovirus / Enterovirus NOT DETECTED NOT DETECTED   Influenza A NOT DETECTED NOT DETECTED   Influenza B NOT DETECTED NOT DETECTED   Parainfluenza Virus 1 NOT DETECTED NOT DETECTED   Parainfluenza Virus 2 NOT DETECTED NOT DETECTED   Parainfluenza Virus 3 NOT DETECTED NOT DETECTED   Parainfluenza Virus 4 NOT DETECTED NOT DETECTED   Respiratory Syncytial Virus NOT DETECTED NOT DETECTED   Bordetella  pertussis NOT DETECTED NOT  DETECTED   Chlamydophila pneumoniae NOT DETECTED NOT DETECTED   Mycoplasma pneumoniae NOT DETECTED NOT DETECTED    Comment: Performed at [facility], [place] [address], [place], [place] [address]   Ct Chest Wo Contrast  Result Date: 06/10/2018 CLINICAL DATA:  Respiratory distress EXAM: CT CHEST WITHOUT CONTRAST TECHNIQUE: Multidetector CT imaging of the chest was performed following the standard protocol without IV contrast. COMPARISON:  Chest x-ray 06/09/2018 FINDINGS: Cardiovascular: Heart is normal size.  Aorta is normal caliber. Mediastinum/Nodes: No mediastinal, hilar, or axillary adenopathy. Lungs/Pleura: Bilateral lower lobe airspace opacities are present and are predominantly platelike, favor atelectasis although pneumonia cannot be completely excluded in the lung bases. No effusions. Upper Abdomen: Imaging into the upper abdomen shows no acute findings. Musculoskeletal: Chest wall soft tissues are unremarkable. No acute bony abnormality. IMPRESSION: Bilateral lower lobe opacities, predominantly platelike, favor atelectasis, but it is difficult to completely exclude pneumonia. Electronically Signed   By: Rolm Baptise M.D.   On: 06/10/2018 02:30   Dg Chest Port 1 View  Result Date: 06/09/2018 CLINICAL DATA:  Wheezing, cough, shortness of breath EXAM: PORTABLE CHEST 1 VIEW COMPARISON:  None. FINDINGS: Heart is borderline in size. Bibasilar airspace opacities. No visible significant effusions. No acute bony abnormality. IMPRESSION: Bibasilar atelectasis or infiltrates. Electronically Signed   By: Rolm Baptise M.D.   On: 06/09/2018 23:37    Pending Labs Unresulted Labs (From admission, onward)    Start     Ordered   06/10/18 0304  Culture, sputum-assessment  Once,   R     06/10/18 0332   06/10/18 0304  Gram stain  Once,   R     06/10/18 0332   06/10/18 0304  HIV antibody (Routine Screening)  Once,   R     06/10/18 0332   06/10/18 0304  Strep pneumoniae  urinary antigen  Once,   R     06/10/18 0332   06/10/18 0247  Novel Coronavirus, NAA (hospital order; send-out to ref lab)  (Novel Coronavirus, NAA [facility] Order; send-out to ref lab) with precautions panel)  Once,   R     06/10/18 0254   06/09/18 2316  Blood culture (routine x 2)  BLOOD CULTURE X 2,   STAT     06/09/18 2315          Vitals/Pain Today's Vitals   06/10/18 0300 06/10/18 0315 06/10/18 0330 06/10/18 0345  BP: 107/67 139/65 126/68 135/77  Pulse: 74 83 77 82  Resp: 16 (!) 25 (!) 30 (!) 31  Temp:      TempSrc:      SpO2: 98% 100% 100% 99%  Weight:      Height:      PainSc:        Isolation Precautions Airborne and Contact precautions  Medications Medications  albuterol (PROVENTIL,VENTOLIN) solution continuous neb (10 mg/hr Nebulization New Bag/Given 06/09/18 2341)  cefTRIAXone (ROCEPHIN) 2 g in sodium chloride 0.9 % 100 mL IVPB (0 g Intravenous Stopped 06/10/18 0332)  azithromycin (ZITHROMAX) 500 mg in sodium chloride 0.9 % 250 mL IVPB (0 mg Intravenous Stopped 06/10/18 0332)  ipratropium-albuterol (DUONEB) 0.5-2.5 (3) MG/3ML nebulizer solution 3 mL (has no administration in time range)  enoxaparin (LOVENOX) injection 40 mg (has no administration in time range)  0.9 %  sodium chloride infusion (has no administration in time range)  predniSONE (DELTASONE) tablet 40 mg (has no administration in time range)  mometasone-formoterol (DULERA) 200-5 MCG/ACT inhaler 1 puff (has no administration in  time range)  umeclidinium bromide (INCRUSE ELLIPTA) 62.5 MCG/INH 1 puff (has no administration in time range)  albuterol (PROVENTIL) (2.5 MG/3ML) 0.083% nebulizer solution 2.5 mg (has no administration in time range)  morphine 2 MG/ML injection 2-4 mg (has no administration in time range)  acetaminophen (TYLENOL) tablet 650 mg (has no administration in time range)  morphine 4 MG/ML injection 4 mg (4 mg Intravenous Given 06/09/18 2338)    Mobility non-ambulatory Low fall risk    Focused Assessments Pulmonary Assessment Handoff:  Lung sounds: Bilateral Breath Sounds: Clear, Diminished L Breath Sounds: Expiratory wheezes, Inspiratory wheezes R Breath Sounds: Inspiratory wheezes, Expiratory wheezes O2 Device: Bi-PAP        R Recommendations: See Admitting Provider Note  Report given to:   Additional Notes: Pt on BiPap. Pt being tested for COVID-19.

## 2018-06-10 NOTE — Progress Notes (Signed)
[facility] PROGRESS NOTE    Progress Note  [name]  VFI:[id number] DOB: January 05, 1961 DOA: 06/09/2018 PCP: [name], MD     Brief Narrative:   [name] is an 58 y.o. male past medical history of COPD not on oxygen, essential hypertension who comes in for a temperature of 101 at home with a productive cough that started about 4 days prior to admission, called EMS given Solu-Medrol and inhalers with mild improvement noted empirically on Rocephin and azithromycin.  Assessment/Plan:   Acute respiratory failure with hypoxia (HCC) due to Community acquired bilateral lower lobe pneumonia ([place]) Started empirically on Rocephin and azithromycin. PCR is negative, respiratory panel is pending. Was placed in isolation for concern of COVID-19 after speaking with infection control. Was also started empirically on IV steroids. Currently on BiPAP saturating greater than 95% febrile here in the hospital. White blood cell count is 7.0 PMNs and lymphocytes are within normal. Start Spiriva, schedule albuterol every 6 hours and every 2 hours PRN. He is not able to speak in full sentences, he has wheezing bilaterally on physical examination, he still requires oxygen supplementation, will continue on BiPAP. Check a procalcitonin.  COPD GOLD 0 / active smoker/COPD exacerbation: I agree with scheduled inhalers and as needed.  Every 2 hours as needed and Spiriva. Change to IV steroids and as needed albuterol.  Essential hypertension: Hold Antihypertensive medication.  DVT prophylaxis: lovenxo Family Communication:none Disposition Plan/Barrier to D/C: unable to determine Code Status:     Code Status Orders  (From admission, onward)         Start     Ordered   06/10/18 0322  Full code  Continuous     06/10/18 0332        Code Status History    Date Active Date Inactive Code Status Order ID Comments User Context   12/20/2017 1713 12/22/2017 1423 Full Code  [id number]  Damita Lack, MD ED   04/26/2015 0324 04/27/2015 1816 Full Code [id number]  Norval Morton, MD Inpatient   07/21/2014 1736 07/25/2014 1440 Full Code [id number]  Judeth Horn, MD ED   04/15/2013 0301 04/16/2013 1840 Full Code [id number]  [name], MD Inpatient        IV Access:    Peripheral IV   Procedures and diagnostic studies:   Ct Chest Wo Contrast  Result Date: 06/10/2018 CLINICAL DATA:  Respiratory distress EXAM: CT CHEST WITHOUT CONTRAST TECHNIQUE: Multidetector CT imaging of the chest was performed following the standard protocol without IV contrast. COMPARISON:  Chest x-ray 06/09/2018 FINDINGS: Cardiovascular: Heart is normal size.  Aorta is normal caliber. Mediastinum/Nodes: No mediastinal, hilar, or axillary adenopathy. Lungs/Pleura: Bilateral lower lobe airspace opacities are present and are predominantly platelike, favor atelectasis although pneumonia cannot be completely excluded in the lung bases. No effusions. Upper Abdomen: Imaging into the upper abdomen shows no acute findings. Musculoskeletal: Chest wall soft tissues are unremarkable. No acute bony abnormality. IMPRESSION: Bilateral lower lobe opacities, predominantly platelike, favor atelectasis, but it is difficult to completely exclude pneumonia. Electronically Signed   By: Rolm Baptise M.D.   On: 06/10/2018 02:30   Dg Chest Port 1 View  Result Date: 06/09/2018 CLINICAL DATA:  Wheezing, cough, shortness of breath EXAM: PORTABLE CHEST 1 VIEW COMPARISON:  None. FINDINGS: Heart is borderline in size. Bibasilar airspace opacities. No visible significant effusions. No acute bony abnormality. IMPRESSION: Bibasilar atelectasis or infiltrates. Electronically Signed   By: Rolm Baptise M.D.   On: 06/09/2018  23:37     Medical Consultants:    None.  Anti-Infectives:   IV Rocephin and azithromycin  Subjective:    [name] relates he feels feels lousy, with productive cough and difficulty  breathing.  He is anorexic.  Objective:    Vitals:   06/10/18 0345 06/10/18 0521 06/10/18 0548 06/10/18 0600  BP: 135/77 138/88 (!) 150/90 (!) 148/88  Pulse: 82 (!) 104 90 85  Resp: (!) 31 (!) 28 (!) 26 (!) 23  Temp:   97.6 F (36.4 C)   TempSrc:   Axillary   SpO2: 99% 100% 100% 98%  Weight:      Height:   6\' 2"  (1.88 m)    No intake or output data in the 24 hours ending 06/10/18 0721 Filed Weights   06/09/18 2331  Weight: 123.6 kg    Exam: General exam: In no acute distress. Respiratory system: Tight air movement with wheezing bilaterally he is not using accessory muscles. Cardiovascular system: Rate and rhythm with positive S1-S2. Gastrointestinal system: Abdomen is nondistended, soft and nontender.  Central nervous system: Alert and oriented. No focal neurological deficits. Extremities: No pedal edema. Skin: No rashes, lesions or ulcers Psychiatry: Judgement and insight appear normal. Mood & affect appropriate.    Data Reviewed:    Labs: Basic Metabolic Panel: Recent Labs  Lab 06/09/18 2300  NA 139  K 3.5  CL 107  CO2 18*  GLUCOSE 191*  BUN 7  CREATININE 0.99  CALCIUM 9.2   GFR Estimated Creatinine Clearance: 113.7 mL/min (by C-G formula based on SCr of 0.99 mg/dL). Liver Function Tests: Recent Labs  Lab 06/09/18 2300  AST 21  ALT 25  ALKPHOS 66  BILITOT 0.7  PROT 7.0  ALBUMIN 3.9   No results for input(s): LIPASE, AMYLASE in the last 168 hours. No results for input(s): AMMONIA in the last 168 hours. Coagulation profile No results for input(s): INR, PROTIME in the last 168 hours.  CBC: Recent Labs  Lab 06/09/18 2300  WBC 7.0  NEUTROABS 2.5  HGB 15.7  HCT 49.3  MCV 89.3  PLT 272   Cardiac Enzymes: Recent Labs  Lab 06/09/18 2300  TROPONINI <0.03   BNP (last 3 results) No results for input(s): PROBNP in the last 8760 hours. CBG: Recent Labs  Lab 06/10/18 0558  GLUCAP 171*   D-Dimer: No results for input(s): DDIMER in the  last 72 hours. Hgb A1c: No results for input(s): HGBA1C in the last 72 hours. Lipid Profile: No results for input(s): CHOL, HDL, LDLCALC, TRIG, CHOLHDL, LDLDIRECT in the last 72 hours. Thyroid function studies: No results for input(s): TSH, T4TOTAL, T3FREE, THYROIDAB in the last 72 hours.  Invalid input(s): FREET3 Anemia work up: No results for input(s): VITAMINB12, FOLATE, FERRITIN, TIBC, IRON, RETICCTPCT in the last 72 hours. Sepsis Labs: Recent Labs  Lab 06/09/18 2300  WBC 7.0   Microbiology Recent Results (from the past 240 hour(s))  Respiratory Panel by PCR     Status: None   Collection Time: 06/10/18  1:36 AM  Result Value Ref Range Status   Adenovirus NOT DETECTED NOT DETECTED Final   Coronavirus 229E NOT DETECTED NOT DETECTED Final    Comment: (NOTE) The Coronavirus on the Respiratory Panel, DOES NOT test for the novel  Coronavirus (2019 nCoV)    Coronavirus HKU1 NOT DETECTED NOT DETECTED Final   Coronavirus NL63 NOT DETECTED NOT DETECTED Final   Coronavirus OC43 NOT DETECTED NOT DETECTED Final   Metapneumovirus NOT DETECTED  NOT DETECTED Final   Rhinovirus / Enterovirus NOT DETECTED NOT DETECTED Final   Influenza A NOT DETECTED NOT DETECTED Final   Influenza B NOT DETECTED NOT DETECTED Final   Parainfluenza Virus 1 NOT DETECTED NOT DETECTED Final   Parainfluenza Virus 2 NOT DETECTED NOT DETECTED Final   Parainfluenza Virus 3 NOT DETECTED NOT DETECTED Final   Parainfluenza Virus 4 NOT DETECTED NOT DETECTED Final   Respiratory Syncytial Virus NOT DETECTED NOT DETECTED Final   Bordetella pertussis NOT DETECTED NOT DETECTED Final   Chlamydophila pneumoniae NOT DETECTED NOT DETECTED Final   Mycoplasma pneumoniae NOT DETECTED NOT DETECTED Final    Comment: Performed at [facility], [place] [address] [place], [place] [address]     Medications:   . enoxaparin (LOVENOX) injection  40 mg Subcutaneous Daily  . ipratropium-albuterol  3 mL Nebulization QID  .  mometasone-formoterol  1 puff Inhalation BID  . predniSONE  40 mg Oral Q breakfast  . umeclidinium bromide  1 puff Inhalation Daily   Continuous Infusions: . sodium chloride 125 mL/hr (06/10/18 0612)  . azithromycin Stopped (06/10/18 0332)  . cefTRIAXone (ROCEPHIN)  IV Stopped (06/10/18 0332)      LOS: 0 days   Charlynne Cousins  [facility]  06/10/2018, 7:21 AM

## 2018-06-10 NOTE — Progress Notes (Signed)
Nutrition Brief Note  Received consult from the COPD protocol. Spoke with patient on the intercom system. He says he is hungry. He eats well and has not lost any weight.   Body mass index is 34.99 kg/m. Patient meets criteria for obesity based on current BMI.   Current diet order is heart healthy, patient is consuming 100% of meals at this time. Labs and medications reviewed.   No nutrition interventions warranted at this time. If nutrition issues arise, please consult RD.   Molli Barrows, RD, LDN, [facility] Pager [phone] After Hours Pager [phone]

## 2018-06-11 LAB — PROCALCITONIN: Procalcitonin: <0.1 ng/mL

## 2018-06-11 MED ORDER — ALBUTEROL SULFATE HFA 108 (90 BASE) MCG/ACT IN AERS
2.0000 | INHALATION_SPRAY | RESPIRATORY_TRACT | Status: DC | PRN
  Filled 2018-06-11: qty 6.7

## 2018-06-11 NOTE — Progress Notes (Signed)
OT Cancellation Note  Patient Details Name: [name] MRN: [id number] DOB: November 20, 1960   Cancelled Treatment:    Reason Eval/Treat Not Completed: Medical issues which prohibited therapy. Per Dr. Olevia Bowens, therapy to begin after COVID-19 results are provided. Will continue to follow.  Malka So 06/11/2018, 11:10 AM  Nestor Lewandowsky, OTR/L Acute Rehabilitation Services Pager: [phone] Office: [phone]

## 2018-06-11 NOTE — Progress Notes (Signed)
PT Cancellation Note  Patient Details Name: [name] MRN: [id number] DOB: [birth year]   Cancelled Treatment:    Reason Eval/Treat Not Completed: Patient not medically ready. Per MD Olevia Bowens on 3/17, will hold evaluation until results indicated.    Duncan Dull 06/11/2018, 11:10 AM Alben Deeds, PT DPT  Board Certified Neurologic Specialist Acute Rehabilitation Services Pager [phone] Office [phone]

## 2018-06-11 NOTE — Progress Notes (Signed)
Pt offered Servo NIV for the night and declined. RT will continue to monitor.

## 2018-06-11 NOTE — Progress Notes (Signed)
[facility] PROGRESS NOTE    Progress Note  [name]  DPO:[id number] DOB: 25-Apr-1960 DOA: 06/09/2018 PCP: [name], MD     Brief Narrative:   [name] is an 58 y.o. male past medical history of COPD not on oxygen, essential hypertension who comes in for a temperature of 101 at home with a productive cough that started about 4 days prior to admission, called EMS given Solu-Medrol and inhalers with mild improvement noted empirically on Rocephin and azithromycin.  Assessment/Plan:   Acute respiratory failure with hypoxia (HCC) due to Community acquired bilateral lower lobe pneumonia ([place]) -Initially he was not able to speak in full sentences, he has wheezing bilaterally on physical examination, Was on BiPAP , today on HFNC Started empirically on Rocephin and azithromycin.IV steroids.on Spiriva, schedule albuterol every 6 hours and every 2 hours PRN. PCR is negative, respiratory panel negative, procalcitonin negative  in isolation for concern of COVID-19 after speaking with infection control.White blood cell count is 7.0 PMNs and lymphocytes are within normal.    COPD GOLD 0 / active smoker/COPD exacerbation: scheduled inhalers and as needed.  Every 2 hours as needed and Spiriva. On  IV steroids and as needed albuterol.  Essential hypertension: Hold Antihypertensive medication.  DVT prophylaxis: lovenxo Family Communication:none Disposition Plan/Barrier to D/C: unable to determine Code Status:     Code Status Orders  (From admission, onward)         Start     Ordered   06/10/18 0322  Full code  Continuous     06/10/18 0332        Code Status History    Date Active Date Inactive Code Status Order ID Comments User Context   12/20/2017 1713 12/22/2017 1423 Full Code [id number]  Damita Lack, MD ED   04/26/2015 0324 04/27/2015 1816 Full Code [id number]  Norval Morton, MD Inpatient   07/21/2014 1736 07/25/2014 1440 Full Code [id number]   Judeth Horn, MD ED   04/15/2013 0301 04/16/2013 1840 Full Code [id number]  [name], MD Inpatient        IV Access:    Peripheral IV   Procedures and diagnostic studies:   Ct Chest Wo Contrast  Result Date: 06/10/2018 CLINICAL DATA:  Respiratory distress EXAM: CT CHEST WITHOUT CONTRAST TECHNIQUE: Multidetector CT imaging of the chest was performed following the standard protocol without IV contrast. COMPARISON:  Chest x-ray 06/09/2018 FINDINGS: Cardiovascular: Heart is normal size.  Aorta is normal caliber. Mediastinum/Nodes: No mediastinal, hilar, or axillary adenopathy. Lungs/Pleura: Bilateral lower lobe airspace opacities are present and are predominantly platelike, favor atelectasis although pneumonia cannot be completely excluded in the lung bases. No effusions. Upper Abdomen: Imaging into the upper abdomen shows no acute findings. Musculoskeletal: Chest wall soft tissues are unremarkable. No acute bony abnormality. IMPRESSION: Bilateral lower lobe opacities, predominantly platelike, favor atelectasis, but it is difficult to completely exclude pneumonia. Electronically Signed   By: Rolm Baptise M.D.   On: 06/10/2018 02:30   Dg Chest Port 1 View  Result Date: 06/09/2018 CLINICAL DATA:  Wheezing, cough, shortness of breath EXAM: PORTABLE CHEST 1 VIEW COMPARISON:  None. FINDINGS: Heart is borderline in size. Bibasilar airspace opacities. No visible significant effusions. No acute bony abnormality. IMPRESSION: Bibasilar atelectasis or infiltrates. Electronically Signed   By: Rolm Baptise M.D.   On: 06/09/2018 23:37     Medical Consultants:    None.  Anti-Infectives:   IV Rocephin and azithromycin  Subjective:    Victor  W Ramsey on HFNC, able to talk in full sentences, reports nonproductive cough , some headache.  He is anorexic. tmax 98.4  Objective:    Vitals:   06/11/18 1400 06/11/18 1500 06/11/18 1600 06/11/18 1700  BP: 127/68     Pulse: 70 70 66 70   Resp: 16 16 19 17   Temp:      TempSrc:      SpO2: 98% 98% 96% 98%  Weight:      Height:        Intake/Output Summary (Last 24 hours) at 06/11/2018 1755 Last data filed at 06/11/2018 1700 Gross per 24 hour  Intake 3501.91 ml  Output 2325 ml  Net 1176.91 ml   Filed Weights   06/09/18 2331  Weight: 123.6 kg    Exam: General exam: In no acute distress. Respiratory system: Tight air movement with wheezing bilaterally he is not using accessory muscles. Cardiovascular system: Rate and rhythm with positive S1-S2. Gastrointestinal system: Abdomen is nondistended, soft and nontender.  Central nervous system: Alert and oriented. No focal neurological deficits. Extremities: No pedal edema. Skin: No rashes, lesions or ulcers Psychiatry: Judgement and insight appear normal. Mood & affect appropriate.    Data Reviewed:    Labs: Basic Metabolic Panel: Recent Labs  Lab 06/09/18 2300  NA 139  K 3.5  CL 107  CO2 18*  GLUCOSE 191*  BUN 7  CREATININE 0.99  CALCIUM 9.2   GFR Estimated Creatinine Clearance: 113.7 mL/min (by C-G formula based on SCr of 0.99 mg/dL). Liver Function Tests: Recent Labs  Lab 06/09/18 2300  AST 21  ALT 25  ALKPHOS 66  BILITOT 0.7  PROT 7.0  ALBUMIN 3.9   No results for input(s): LIPASE, AMYLASE in the last 168 hours. No results for input(s): AMMONIA in the last 168 hours. Coagulation profile No results for input(s): INR, PROTIME in the last 168 hours.  CBC: Recent Labs  Lab 06/09/18 2300  WBC 7.0  NEUTROABS 2.5  HGB 15.7  HCT 49.3  MCV 89.3  PLT 272   Cardiac Enzymes: Recent Labs  Lab 06/09/18 2300  TROPONINI <0.03   BNP (last 3 results) No results for input(s): PROBNP in the last 8760 hours. CBG: Recent Labs  Lab 06/10/18 0558  GLUCAP 171*   D-Dimer: No results for input(s): DDIMER in the last 72 hours. Hgb A1c: No results for input(s): HGBA1C in the last 72 hours. Lipid Profile: No results for input(s): CHOL, HDL,  LDLCALC, TRIG, CHOLHDL, LDLDIRECT in the last 72 hours. Thyroid function studies: No results for input(s): TSH, T4TOTAL, T3FREE, THYROIDAB in the last 72 hours.  Invalid input(s): FREET3 Anemia work up: No results for input(s): VITAMINB12, FOLATE, FERRITIN, TIBC, IRON, RETICCTPCT in the last 72 hours. Sepsis Labs: Recent Labs  Lab 06/09/18 2300 06/10/18 1020 06/11/18 0430  PROCALCITON  --  <0.10 <0.10  WBC 7.0  --   --    Microbiology Recent Results (from the past 240 hour(s))  Blood culture (routine x 2)     Status: None (Preliminary result)   Collection Time: 06/09/18 11:00 PM  Result Value Ref Range Status   Specimen Description BLOOD RIGHT FOREARM  Final   Special Requests   Final    BOTTLES DRAWN AEROBIC AND ANAEROBIC Blood Culture adequate volume   Culture   Final    NO GROWTH 1 DAY Performed at [facility], [place] [address] [place], [place] [address]    Report Status PENDING  Incomplete  Blood  culture (routine x 2)     Status: None (Preliminary result)   Collection Time: 06/09/18 11:25 PM  Result Value Ref Range Status   Specimen Description BLOOD LEFT HAND  Final   Special Requests   Final    BOTTLES DRAWN AEROBIC AND ANAEROBIC Blood Culture adequate volume   Culture   Final    NO GROWTH 1 DAY Performed at [facility], [place] [address], [place], [place] [address]    Report Status PENDING  Incomplete  Respiratory Panel by PCR     Status: None   Collection Time: 06/10/18  1:36 AM  Result Value Ref Range Status   Adenovirus NOT DETECTED NOT DETECTED Final   Coronavirus 229E NOT DETECTED NOT DETECTED Final    Comment: (NOTE) The Coronavirus on the Respiratory Panel, DOES NOT test for the novel  Coronavirus (2019 nCoV)    Coronavirus HKU1 NOT DETECTED NOT DETECTED Final   Coronavirus NL63 NOT DETECTED NOT DETECTED Final   Coronavirus OC43 NOT DETECTED NOT DETECTED Final   Metapneumovirus NOT DETECTED NOT DETECTED Final   Rhinovirus / Enterovirus NOT  DETECTED NOT DETECTED Final   Influenza A NOT DETECTED NOT DETECTED Final   Influenza B NOT DETECTED NOT DETECTED Final   Parainfluenza Virus 1 NOT DETECTED NOT DETECTED Final   Parainfluenza Virus 2 NOT DETECTED NOT DETECTED Final   Parainfluenza Virus 3 NOT DETECTED NOT DETECTED Final   Parainfluenza Virus 4 NOT DETECTED NOT DETECTED Final   Respiratory Syncytial Virus NOT DETECTED NOT DETECTED Final   Bordetella pertussis NOT DETECTED NOT DETECTED Final   Chlamydophila pneumoniae NOT DETECTED NOT DETECTED Final   Mycoplasma pneumoniae NOT DETECTED NOT DETECTED Final    Comment: Performed at [facility], [address], [place], [place] [address]  MRSA PCR Screening     Status: None   Collection Time: 06/10/18  6:14 AM  Result Value Ref Range Status   MRSA by PCR NEGATIVE NEGATIVE Final    Comment:        The GeneXpert MRSA Assay (FDA approved for NASAL specimens only), is one component of a comprehensive MRSA colonization surveillance program. It is not intended to diagnose MRSA infection nor to guide or monitor treatment for MRSA infections. Performed at [facility], [place] [address] [place], [place] [address]      Medications:   . albuterol  1-2 puff Inhalation Q6H  . enoxaparin (LOVENOX) injection  60 mg Subcutaneous Daily  . methylPREDNISolone (SOLU-MEDROL) injection  60 mg Intravenous Q8H  . mometasone-formoterol  2 puff Inhalation BID  . umeclidinium bromide  1 puff Inhalation Daily   Continuous Infusions: . sodium chloride 125 mL/hr at 06/11/18 1700  . azithromycin Stopped (06/11/18 0248)  . cefTRIAXone (ROCEPHIN)  IV Stopped (06/11/18 0214)      LOS: 1 day   [facility]  06/11/2018, 5:55 PM

## 2018-06-11 NOTE — Progress Notes (Addendum)
CSW acknowledges consult for medication assistance. CSW has reached out to [facility] to also follow up with pt regarding this need.   Virgie Dad Terria Deschepper, MSW, [facility] Clinical Social Worker [phone]

## 2018-06-11 NOTE — TOC Initial Note (Signed)
Transition of Care [facility]) - Initial/Assessment Note    Patient Details  Name: [name] MRN: [id number] Date of Birth: 15-Jul-1960  Transition of Care [facility]) CM/SW Contact:    Wetzel Bjornstad, [place] Phone Number: 06/11/2018, 10:28 AM  Clinical Narrative:                 PT is from home. TOC consulted for medication needs post discharge. Pt listed as Medicaid Potential/ MATCH will be assessed at discharge. Per Epic. PCP is with [facility] however TOC unable to reach clinic. TOC set up appointment with [facility]( this appointment will give pt access to [facility] pharmacy) therefore pt will be able to gain medications assistance.   TOC spoke with attending to ensure discharge medication is the cheapest options for pt.      Expected Discharge Plan: Home/Self Care Barriers to Discharge: Continued Medical Work up   Patient Goals and CMS Choice        Expected Discharge Plan and Services Expected Discharge Plan: Home/Self Care Discharge Planning Services: Medication Assistance, [place] with [facility]  3/27 at 9:20am)   Living arrangements for the past 2 months: [place]                          Prior Living Arrangements/Services Living arrangements for the past 2 months: Single Family Home Lives with:: Self Patient language and need for interpreter reviewed:: Yes Do you feel safe going back to the place where you live?: (unknown)               Activities of Daily Living      Permission Sought/Granted Permission sought to share information with : Other (comment)(TOC)                Emotional Assessment         Alcohol / Substance Use: Not Applicable Psych Involvement: No (comment)  Admission diagnosis:  Acute respiratory failure with hypoxia ([place]) [J96.01] Patient Active Problem List   Diagnosis Date Noted  . Acute respiratory failure with hypoxia ([place]) 06/10/2018  . COPD with acute exacerbation ([place])  06/10/2018  . Community acquired bilateral lower lobe pneumonia ([place]) 06/10/2018  . Achilles tendinitis, right leg 04/24/2018  . Acute respiratory distress 12/20/2017  . History of noncompliance with medical treatment 12/20/2017  . Acute sinusitis, unspecified 03/15/2017  . Rupture of right distal biceps tendon 03/01/2016  . Pain in right lower leg 01/30/2016  . Chest pain 04/26/2015  . Hypokalemia 04/26/2015  . COPD GOLD 0 / active smoker 04/26/2015  . Pain in the chest   . Fall 07/22/2014  . Multiple fractures of ribs of right side 07/22/2014  . Pneumothorax on right 07/22/2014  . Traumatic pneumohemothorax 07/21/2014  . Altered mental state 04/15/2013  . Acute encephalopathy 04/15/2013  . Difficulty speaking 04/15/2013  . Headache(784.0) 04/15/2013  . Cigarette smoker 04/15/2013  . HTN (hypertension) 04/15/2013   PCP:  [name], MD Pharmacy:   [id number] [address], [place] [address] Phone: [phone] Fax: [phone]     Social Determinants of Health (SDOH) Interventions    Readmission Risk Interventions 30 Day Unplanned Readmission Risk Score     ED to Hosp-Admission (Current) from 06/09/2018 in [place] ICU  30 Day Unplanned Readmission Risk Score (%)  10 Filed at 06/11/2018 0801  This score is the patient's risk of an unplanned readmission within 30 days of being discharged (0 -100%). The score is based on dignosis, age, lab data, medications, orders, and past utilization.   Low:  0-14.9   Medium: 15-21.9   High: 22-29.9   Extreme: 30 and above       No flowsheet data found.

## 2018-06-12 LAB — BASIC METABOLIC PANEL
ANION GAP: 5 (ref 5–15)
BUN: 12 mg/dL (ref 6–20)
CO2: 22 mmol/L (ref 22–32)
Calcium: 8.6 mg/dL — ABNORMAL LOW (ref 8.9–10.3)
Chloride: 111 mmol/L (ref 98–111)
Creatinine, Ser: 0.99 mg/dL (ref 0.61–1.24)
GFR calc Af Amer: >60 mL/min (ref >60)
GFR calc non Af Amer: >60 mL/min (ref >60)
Glucose, Bld: 104 mg/dL — ABNORMAL HIGH (ref 70–99)
Potassium: 4.2 mmol/L (ref 3.5–5.1)
Sodium: 138 mmol/L (ref 135–145)

## 2018-06-12 LAB — CBC WITH DIFFERENTIAL/PLATELET
Abs Immature Granulocytes: 0.08 10*3/uL — ABNORMAL HIGH (ref 0.00–0.07)
Basophils Absolute: 0 10*3/uL (ref 0.0–0.1)
Basophils Relative: 0 %
EOS ABS: 0 10*3/uL (ref 0.0–0.5)
Eosinophils Relative: 0 %
HCT: 42.7 % (ref 39.0–52.0)
Hemoglobin: 13.9 g/dL (ref 13.0–17.0)
Immature Granulocytes: 1 %
Lymphocytes Relative: 7 %
Lymphs Abs: 0.9 10*3/uL (ref 0.7–4.0)
MCH: 29.3 pg (ref 26.0–34.0)
MCHC: 32.6 g/dL (ref 30.0–36.0)
MCV: 90.1 fL (ref 80.0–100.0)
Monocytes Absolute: 0.5 10*3/uL (ref 0.1–1.0)
Monocytes Relative: 5 %
Neutro Abs: 10.1 10*3/uL — ABNORMAL HIGH (ref 1.7–7.7)
Neutrophils Relative %: 87 %
Platelets: 214 10*3/uL (ref 150–400)
RBC: 4.74 MIL/uL (ref 4.22–5.81)
RDW: 14.4 % (ref 11.5–15.5)
WBC: 11.6 10*3/uL — ABNORMAL HIGH (ref 4.0–10.5)
nRBC: 0 % (ref 0.0–0.2)

## 2018-06-12 LAB — PROCALCITONIN: Procalcitonin: <0.1 ng/mL

## 2018-06-12 LAB — LACTIC ACID, PLASMA: Lactic Acid, Venous: 2 mmol/L (ref 0.5–1.9)

## 2018-06-12 LAB — MAGNESIUM: Magnesium: 2.2 mg/dL (ref 1.7–2.4)

## 2018-06-12 NOTE — Progress Notes (Signed)
PT Cancellation Note  Patient Details Name: [name] MRN: [id number] DOB: 1961/03/21   Cancelled Treatment:    Reason Eval/Treat Not Completed: Medical issues which prohibited therapy  (Awaiting results of COVID-19 test.)  Reinaldo Berber, PT, DPT Acute Rehabilitation Services Pager: [phone] Office: [phone]     Reinaldo Berber 06/12/2018, 12:23 PM

## 2018-06-12 NOTE — Progress Notes (Signed)
Just spoke with MD, Florencia Reasons, will continue Airborne/contact precautions for now until COVID-19 lab test results.  Dewaine Oats, RN

## 2018-06-12 NOTE — Progress Notes (Signed)
Nutrition Brief Note  Received consult due to patient not receiving a breakfast tray today during regularly scheduled meal time. He is also unhappy with his menu options on the heart healthy diet. He is eating well, but would like additional options. RN was able to call [facility] and breakfast tray was delivered at ~9:45. RD has arranged for patient to receive snacks between meals because he is hungry. Also spoke with Administrator, arts regarding meal trays; manager to help ensure patient receives meal tray at all meal times.  Intervention:  Snacks TID between meals.  Recommend liberalize diet to regular to increase meal options during his acute illness.   Molli Barrows, RD, LDN, [place] Pager [phone] After Hours Pager [phone]

## 2018-06-12 NOTE — Progress Notes (Signed)
OT Cancellation Note  Patient Details Name: LAWARENCE MEEK MRN: [id number] DOB: May 19, 1960   Cancelled Treatment:    Reason Eval/Treat Not Completed: Medical issues which prohibited therapy(Awaiting results of COVID-19 test.)  Malka So 06/12/2018, 9:31 AM  Nestor Lewandowsky, OTR/L Acute Rehabilitation Services Pager: [phone] Office: [phone]

## 2018-06-12 NOTE — Progress Notes (Signed)
[facility] PROGRESS NOTE    Progress Note  [name]  SJG:[id number] DOB: [birth year] DOA: 06/09/2018 PCP: [name], MD     Brief Narrative:   [name] is an 58 y.o. male past medical history of COPD not on oxygen, essential hypertension who comes in for a temperature of 101 at home with a productive cough that started about 4 days prior to admission, called EMS given Solu-Medrol and inhalers with mild improvement noted empirically on Rocephin and azithromycin.  Assessment/Plan:   Acute respiratory failure with hypoxia (HCC) due to Community acquired bilateral lower lobe pneumonia ([place]) -Initially he was not able to speak in full sentences, he has wheezing bilaterally on physical examination, Was on BiPAP , then on HFNC, he is on nasal cannula this afternoon -he is Started empirically on Rocephin and azithromycin.IV steroids.on Spiriva, schedule albuterol every 6 hours and every 2 hours PRN. PCR is negative, respiratory panel negative, procalcitonin negative. lymphocytes are within normal.  in isolation for concern of COVID-19 after speaking with infection control.     COPD GOLD 0 / active smoker/COPD exacerbation: scheduled inhalers and as needed.  Every 2 hours as needed and Spiriva. On  IV steroids and as needed albuterol.  Essential hypertension: Not previously on meds, likely due to stress Prn hydralazine  DVT prophylaxis: lovenox Family Communication:none Disposition Plan/Barrier to D/C: unable to determine Code Status:     Code Status Orders  (From admission, onward)         Start     Ordered   06/10/18 0322  Full code  Continuous     06/10/18 0332        Code Status History    Date Active Date Inactive Code Status Order ID Comments User Context   12/20/2017 1713 12/22/2017 1423 Full Code [id number]  Damita Lack, MD ED   04/26/2015 0324 04/27/2015 1816 Full Code [id number]  Norval Morton, MD Inpatient   07/21/2014  1736 07/25/2014 1440 Full Code [id number]  Judeth Horn, MD ED   04/15/2013 0301 04/16/2013 1840 Full Code [id number]  [name], MD Inpatient        IV Access:    Peripheral IV   Procedures and diagnostic studies:   No results found.   Medical Consultants:    None.  Anti-Infectives:   IV Rocephin and azithromycin  Subjective:    [name] on HFNC, able to talk in full sentences, reports nonproductive cough , some headache.  He is anorexic. tmax 98.4  Objective:    Vitals:   06/12/18 1900 06/12/18 1936 06/12/18 1952 06/12/18 2000  BP: (!) 171/146 (!) 152/77 (!) 152/77 (!) 148/75  Pulse: 72 66 67 65  Resp: 18 14 13 16   Temp:      TempSrc:      SpO2: 97% 95% 98% 96%  Weight:      Height:        Intake/Output Summary (Last 24 hours) at 06/12/2018 2051 Last data filed at 06/12/2018 1800 Gross per 24 hour  Intake 2064.64 ml  Output 2500 ml  Net -435.36 ml   Filed Weights   06/09/18 2331  Weight: 123.6 kg    Exam: General exam: In no acute distress. Respiratory system: improved aereation  Cardiovascular system: Rate and rhythm with positive S1-S2. Gastrointestinal system: Abdomen is nondistended, soft and nontender.  Central nervous system: Alert and oriented. No focal neurological deficits. Extremities: No pedal edema. Skin: No rashes, lesions or ulcers Psychiatry:  Judgement and insight appear normal. Mood & affect appropriate.    Data Reviewed:    Labs: Basic Metabolic Panel: Recent Labs  Lab 06/09/18 2300 06/12/18 1647  NA 139 138  K 3.5 4.2  CL 107 111  CO2 18* 22  GLUCOSE 191* 104*  BUN 7 12  CREATININE 0.99 0.99  CALCIUM 9.2 8.6*  MG  --  2.2   GFR Estimated Creatinine Clearance: 113.7 mL/min (by C-G formula based on SCr of 0.99 mg/dL). Liver Function Tests: Recent Labs  Lab 06/09/18 2300  AST 21  ALT 25  ALKPHOS 66  BILITOT 0.7  PROT 7.0  ALBUMIN 3.9   No results for input(s): LIPASE, AMYLASE in the  last 168 hours. No results for input(s): AMMONIA in the last 168 hours. Coagulation profile No results for input(s): INR, PROTIME in the last 168 hours.  CBC: Recent Labs  Lab 06/09/18 2300 06/12/18 1647  WBC 7.0 11.6*  NEUTROABS 2.5 10.1*  HGB 15.7 13.9  HCT 49.3 42.7  MCV 89.3 90.1  PLT 272 214   Cardiac Enzymes: Recent Labs  Lab 06/09/18 2300  TROPONINI <0.03   BNP (last 3 results) No results for input(s): PROBNP in the last 8760 hours. CBG: Recent Labs  Lab 06/10/18 0558  GLUCAP 171*   D-Dimer: No results for input(s): DDIMER in the last 72 hours. Hgb A1c: No results for input(s): HGBA1C in the last 72 hours. Lipid Profile: No results for input(s): CHOL, HDL, LDLCALC, TRIG, CHOLHDL, LDLDIRECT in the last 72 hours. Thyroid function studies: No results for input(s): TSH, T4TOTAL, T3FREE, THYROIDAB in the last 72 hours.  Invalid input(s): FREET3 Anemia work up: No results for input(s): VITAMINB12, FOLATE, FERRITIN, TIBC, IRON, RETICCTPCT in the last 72 hours. Sepsis Labs: Recent Labs  Lab 06/09/18 2300 06/10/18 1020 06/11/18 0430 06/12/18 1647  PROCALCITON  --  <0.10 <0.10 <0.10  WBC 7.0  --   --  11.6*  LATICACIDVEN  --   --   --  2.0*   Microbiology Recent Results (from the past 240 hour(s))  Blood culture (routine x 2)     Status: None (Preliminary result)   Collection Time: 06/09/18 11:00 PM  Result Value Ref Range Status   Specimen Description BLOOD RIGHT FOREARM  Final   Special Requests   Final    BOTTLES DRAWN AEROBIC AND ANAEROBIC Blood Culture adequate volume   Culture   Final    NO GROWTH 2 DAYS Performed at [facility], [place] [address] [place],  [address]    Report Status PENDING  Incomplete  Blood culture (routine x 2)     Status: None (Preliminary result)   Collection Time: 06/09/18 11:25 PM  Result Value Ref Range Status   Specimen Description BLOOD LEFT HAND  Final   Special Requests   Final    BOTTLES DRAWN  AEROBIC AND ANAEROBIC Blood Culture adequate volume   Culture   Final    NO GROWTH 2 DAYS Performed at [facility], [place] [address], [place],  [address]    Report Status PENDING  Incomplete  Respiratory Panel by PCR     Status: None   Collection Time: 06/10/18  1:36 AM  Result Value Ref Range Status   Adenovirus NOT DETECTED NOT DETECTED Final   Coronavirus 229E NOT DETECTED NOT DETECTED Final    Comment: (NOTE) The Coronavirus on the Respiratory Panel, DOES NOT test for the novel  Coronavirus (2019 nCoV)  Coronavirus HKU1 NOT DETECTED NOT DETECTED Final   Coronavirus NL63 NOT DETECTED NOT DETECTED Final   Coronavirus OC43 NOT DETECTED NOT DETECTED Final   Metapneumovirus NOT DETECTED NOT DETECTED Final   Rhinovirus / Enterovirus NOT DETECTED NOT DETECTED Final   Influenza A NOT DETECTED NOT DETECTED Final   Influenza B NOT DETECTED NOT DETECTED Final   Parainfluenza Virus 1 NOT DETECTED NOT DETECTED Final   Parainfluenza Virus 2 NOT DETECTED NOT DETECTED Final   Parainfluenza Virus 3 NOT DETECTED NOT DETECTED Final   Parainfluenza Virus 4 NOT DETECTED NOT DETECTED Final   Respiratory Syncytial Virus NOT DETECTED NOT DETECTED Final   Bordetella pertussis NOT DETECTED NOT DETECTED Final   Chlamydophila pneumoniae NOT DETECTED NOT DETECTED Final   Mycoplasma pneumoniae NOT DETECTED NOT DETECTED Final    Comment: Performed at [facility], [place] [address], [place], [place] [address]  MRSA PCR Screening     Status: None   Collection Time: 06/10/18  6:14 AM  Result Value Ref Range Status   MRSA by PCR NEGATIVE NEGATIVE Final    Comment:        The GeneXpert MRSA Assay (FDA approved for NASAL specimens only), is one component of a comprehensive MRSA colonization surveillance program. It is not intended to diagnose MRSA infection nor to guide or monitor treatment for MRSA infections. Performed at [facility], [place] [address], [place], [place] [address]       Medications:   . albuterol  1-2 puff Inhalation Q6H  . enoxaparin (LOVENOX) injection  60 mg Subcutaneous Daily  . methylPREDNISolone (SOLU-MEDROL) injection  60 mg Intravenous Q8H  . mometasone-formoterol  2 puff Inhalation BID  . umeclidinium bromide  1 puff Inhalation Daily   Continuous Infusions: . sodium chloride Stopped (06/12/18 0918)  . azithromycin 500 mg (06/11/18 2355)  . cefTRIAXone (ROCEPHIN)  IV 2 g (06/11/18 2356)      LOS: 2 days   Florencia Reasons  [facility]  06/12/2018, 8:51 PM

## 2018-06-13 LAB — NOVEL CORONAVIRUS, NAA (HOSP ORDER, SEND-OUT TO REF LAB; TAT 18-24 HRS): SARS-CoV-2, NAA: NOT DETECTED

## 2018-06-13 LAB — EXPECTORATED SPUTUM ASSESSMENT W GRAM STAIN, RFLX TO RESP C

## 2018-06-13 LAB — EXPECTORATED SPUTUM ASSESSMENT W REFEX TO RESP CULTURE

## 2018-06-13 LAB — STREP PNEUMONIAE URINARY ANTIGEN: Strep Pneumo Urinary Antigen: NEGATIVE

## 2018-06-13 MED ORDER — IPRATROPIUM-ALBUTEROL 0.5-2.5 (3) MG/3ML IN SOLN
3.0000 mL | Freq: Four times a day (QID) | RESPIRATORY_TRACT | Status: DC
  Administered 2018-06-13 (×2): 3 mL via RESPIRATORY_TRACT
  Filled 2018-06-13 (×2): qty 3

## 2018-06-13 MED ORDER — AMOXICILLIN-POT CLAVULANATE 875-125 MG PO TABS
1.0000 | ORAL_TABLET | Freq: Two times a day (BID) | ORAL | Status: DC
  Administered 2018-06-13 – 2018-06-14 (×2): 1 via ORAL
  Filled 2018-06-13 (×3): qty 1

## 2018-06-13 MED ORDER — ALBUTEROL SULFATE (2.5 MG/3ML) 0.083% IN NEBU: 2.5000 mg | INHALATION_SOLUTION | Freq: Four times a day (QID) | RESPIRATORY_TRACT | Status: DC | PRN

## 2018-06-13 MED ORDER — ALBUTEROL SULFATE HFA 108 (90 BASE) MCG/ACT IN AERS: 2.0000 | INHALATION_SPRAY | Freq: Four times a day (QID) | RESPIRATORY_TRACT | Status: DC | PRN

## 2018-06-13 MED ORDER — GUAIFENESIN ER 600 MG PO TB12
600.0000 mg | ORAL_TABLET | Freq: Two times a day (BID) | ORAL | Status: DC
  Administered 2018-06-13 – 2018-06-14 (×3): 600 mg via ORAL
  Filled 2018-06-13 (×4): qty 1

## 2018-06-13 MED ORDER — CARBAMIDE PEROXIDE 6.5 % OT SOLN
5.0000 [drp] | Freq: Two times a day (BID) | OTIC | Status: DC
  Administered 2018-06-13 – 2018-06-14 (×2): 5 [drp] via OTIC
  Filled 2018-06-13: qty 15

## 2018-06-13 MED ORDER — IPRATROPIUM-ALBUTEROL 0.5-2.5 (3) MG/3ML IN SOLN
3.0000 mL | Freq: Three times a day (TID) | RESPIRATORY_TRACT | Status: DC
  Administered 2018-06-14: 3 mL via RESPIRATORY_TRACT
  Filled 2018-06-13: qty 3

## 2018-06-13 NOTE — Progress Notes (Addendum)
[facility] PROGRESS NOTE    Progress Note  [name]  KXF:[id number] DOB: 1961/02/14 DOA: 06/09/2018 PCP: [name], MD     Brief Narrative:   [name] is an 58 y.o. male past medical history of COPD not on oxygen, essential hypertension who comes in for a temperature of 101 at home with a productive cough that started about 4 days prior to admission, called EMS given Solu-Medrol and inhalers with mild improvement noted empirically on Rocephin and azithromycin.  Assessment/Plan:   Acute respiratory failure with hypoxia (HCC) due to Community acquired bilateral lower lobe pneumonia ([place]) --PCR is negative, respiratory panel negative, procalcitonin negative. lymphocytes are within normal. blood culture no growth, mrsa screening negative, urine strep pneumo antigen negative  -COVID-19 negative.  -Initially he was not able to speak in full sentences, he has wheezing bilaterally on physical examination, Was on BiPAP , then on HFNC, then on nasal cannula, now on room air,  -he is Started empirically on Rocephin and azithromycin, clinically improving, change to augmentin, f/u on sputum culture, continue IV steroids due to persistent wheezing   COPD GOLD 0 / active smoker/COPD exacerbation: He is improving, though continue to have bilateral wheezing Now he is negative for COVID 19, start on nebs Continue  IV steroids, add on mucinex,    Right ear fullness: Denies ear pain  Essential hypertension: Not previously on meds, likely due to stress Prn hydralazine  DVT prophylaxis: lovenox Family Communication:none Disposition Plan/Barrier to D/C: home in 1-2 days, pending clinical improvement Code Status:     Code Status Orders  (From admission, onward)         Start     Ordered   06/10/18 0322  Full code  Continuous     06/10/18 0332        Code Status History    Date Active Date Inactive Code Status Order ID Comments User Context   12/20/2017 1713 12/22/2017 1423 Full Code [id number]  Damita Lack, MD ED   04/26/2015 0324 04/27/2015 1816 Full Code [id number]  Norval Morton, MD Inpatient   07/21/2014 1736 07/25/2014 1440 Full Code [id number]  Judeth Horn, MD ED   04/15/2013 0301 04/16/2013 1840 Full Code [id number]  [name], MD Inpatient        IV Access:    Peripheral IV   Procedures and diagnostic studies:   No results found.   Medical Consultants:    None.  Anti-Infectives:   IV Rocephin and azithromycin  Subjective:    [name] on HFNC, able to talk in full sentences, reports nonproductive cough , some headache.  He is anorexic. tmax 98.4  Objective:    Vitals:   06/13/18 1000 06/13/18 1100 06/13/18 1300 06/13/18 1325  BP: (!) 146/66 (!) 149/89  (!) 157/92  Pulse: (!) 59 61 64 64  Resp: 16 14 17 16   Temp:      TempSrc:      SpO2: 96% 98% 96% 94%  Weight:      Height:        Intake/Output Summary (Last 24 hours) at 06/13/2018 1348 Last data filed at 06/13/2018 1300 Gross per 24 hour  Intake 417.16 ml  Output 1175 ml  Net -757.84 ml   Filed Weights   06/09/18 2331  Weight: 123.6 kg    Exam: General exam: In no acute distress. Respiratory system: improved aereation , but continue to have bilateral wheezing Cardiovascular system: Rate and rhythm with positive  S1-S2. Gastrointestinal system: Abdomen is nondistended, soft and nontender.  Central nervous system: Alert and oriented. No focal neurological deficits. Extremities: No pedal edema. Skin: No rashes, lesions or ulcers Psychiatry: Judgement and insight appear normal. Mood & affect appropriate.    Data Reviewed:    Labs: Basic Metabolic Panel: Recent Labs  Lab 06/09/18 2300 06/12/18 1647  NA 139 138  K 3.5 4.2  CL 107 111  CO2 18* 22  GLUCOSE 191* 104*  BUN 7 12  CREATININE 0.99 0.99  CALCIUM 9.2 8.6*  MG  --  2.2   GFR Estimated Creatinine Clearance: 113.7 mL/min (by C-G formula  based on SCr of 0.99 mg/dL). Liver Function Tests: Recent Labs  Lab 06/09/18 2300  AST 21  ALT 25  ALKPHOS 66  BILITOT 0.7  PROT 7.0  ALBUMIN 3.9   No results for input(s): LIPASE, AMYLASE in the last 168 hours. No results for input(s): AMMONIA in the last 168 hours. Coagulation profile No results for input(s): INR, PROTIME in the last 168 hours.  CBC: Recent Labs  Lab 06/09/18 2300 06/12/18 1647  WBC 7.0 11.6*  NEUTROABS 2.5 10.1*  HGB 15.7 13.9  HCT 49.3 42.7  MCV 89.3 90.1  PLT 272 214   Cardiac Enzymes: Recent Labs  Lab 06/09/18 2300  TROPONINI <0.03   BNP (last 3 results) No results for input(s): PROBNP in the last 8760 hours. CBG: Recent Labs  Lab 06/10/18 0558  GLUCAP 171*   D-Dimer: No results for input(s): DDIMER in the last 72 hours. Hgb A1c: No results for input(s): HGBA1C in the last 72 hours. Lipid Profile: No results for input(s): CHOL, HDL, LDLCALC, TRIG, CHOLHDL, LDLDIRECT in the last 72 hours. Thyroid function studies: No results for input(s): TSH, T4TOTAL, T3FREE, THYROIDAB in the last 72 hours.  Invalid input(s): FREET3 Anemia work up: No results for input(s): VITAMINB12, FOLATE, FERRITIN, TIBC, IRON, RETICCTPCT in the last 72 hours. Sepsis Labs: Recent Labs  Lab 06/09/18 2300 06/10/18 1020 06/11/18 0430 06/12/18 1647  PROCALCITON  --  <0.10 <0.10 <0.10  WBC 7.0  --   --  11.6*  LATICACIDVEN  --   --   --  2.0*   Microbiology Recent Results (from the past 240 hour(s))  Blood culture (routine x 2)     Status: None (Preliminary result)   Collection Time: 06/09/18 11:00 PM  Result Value Ref Range Status   Specimen Description BLOOD RIGHT FOREARM  Final   Special Requests   Final    BOTTLES DRAWN AEROBIC AND ANAEROBIC Blood Culture adequate volume   Culture   Final    NO GROWTH 3 DAYS Performed at [facility], [place] [address] [place], [place] [address]    Report Status PENDING  Incomplete  Blood culture (routine x 2)      Status: None (Preliminary result)   Collection Time: 06/09/18 11:25 PM  Result Value Ref Range Status   Specimen Description BLOOD LEFT HAND  Final   Special Requests   Final    BOTTLES DRAWN AEROBIC AND ANAEROBIC Blood Culture adequate volume   Culture   Final    NO GROWTH 3 DAYS Performed at [facility], [place] [address], [place], [place] [address]    Report Status PENDING  Incomplete  Respiratory Panel by PCR     Status: None   Collection Time: 06/10/18  1:36 AM  Result Value Ref Range Status   Adenovirus NOT DETECTED NOT DETECTED Final  Coronavirus 229E NOT DETECTED NOT DETECTED Final    Comment: (NOTE) The Coronavirus on the Respiratory Panel, DOES NOT test for the novel  Coronavirus (2019 nCoV)    Coronavirus HKU1 NOT DETECTED NOT DETECTED Final   Coronavirus NL63 NOT DETECTED NOT DETECTED Final   Coronavirus OC43 NOT DETECTED NOT DETECTED Final   Metapneumovirus NOT DETECTED NOT DETECTED Final   Rhinovirus / Enterovirus NOT DETECTED NOT DETECTED Final   Influenza A NOT DETECTED NOT DETECTED Final   Influenza B NOT DETECTED NOT DETECTED Final   Parainfluenza Virus 1 NOT DETECTED NOT DETECTED Final   Parainfluenza Virus 2 NOT DETECTED NOT DETECTED Final   Parainfluenza Virus 3 NOT DETECTED NOT DETECTED Final   Parainfluenza Virus 4 NOT DETECTED NOT DETECTED Final   Respiratory Syncytial Virus NOT DETECTED NOT DETECTED Final   Bordetella pertussis NOT DETECTED NOT DETECTED Final   Chlamydophila pneumoniae NOT DETECTED NOT DETECTED Final   Mycoplasma pneumoniae NOT DETECTED NOT DETECTED Final    Comment: Performed at [facility], [place] [address], [place], [place] [address]  Novel Coronavirus, NAA (hospital order; send-out to ref lab)     Status: None   Collection Time: 06/10/18  1:36 AM  Result Value Ref Range Status   SARS-CoV-2, NAA Not Detected Not Detected Final    Comment: (NOTE) Testing was performed using the cobas(R) SARS-CoV-2 test. This test was  developed and its performance characteristics determined by [employer]. This test has not been FDA cleared or approved. This test has been authorized by FDA under an Emergency Use Authorization (EUA). This test is only authorized for the duration of time the declaration that circumstances exist justifying the authorization of the emergency use of in vitro diagnostic tests for detection of SARS-CoV-2 virus and/or diagnosis of COVID-19 infection under section 564(b)(1) of the Act, 21 U.S.C. 732KGU-5(K)(2), unless the authorization is terminated or revoked sooner. Performed At: [facility] [address] [place], [place] [id number] Rush Farmer MD BT:[id number]    Coronavirus Source NASAL SWAB  Final    Comment: Performed at [facility], [place] [address] [place], [place] [address]  MRSA PCR Screening     Status: None   Collection Time: 06/10/18  6:14 AM  Result Value Ref Range Status   MRSA by PCR NEGATIVE NEGATIVE Final    Comment:        The GeneXpert MRSA Assay (FDA approved for NASAL specimens only), is one component of a comprehensive MRSA colonization surveillance program. It is not intended to diagnose MRSA infection nor to guide or monitor treatment for MRSA infections. Performed at [facility], [place] [address] [place], [place] [address]   Culture, sputum-assessment     Status: None   Collection Time: 06/12/18 12:12 AM  Result Value Ref Range Status   Specimen Description EXPECTORATED SPUTUM  Final   Special Requests NONE  Final   Sputum evaluation   Final    THIS SPECIMEN IS ACCEPTABLE FOR SPUTUM CULTURE Performed at [facility], [place] [address] [place], [place] [address]    Report Status 06/13/2018 FINAL  Final  Culture, respiratory     Status: None (Preliminary result)   Collection Time: 06/12/18 12:12 AM  Result Value Ref Range Status   Specimen Description EXPECTORATED SPUTUM  Final   Special Requests NONE Reflexed from  [id number]  Final   Gram Stain   Final    NO WBC SEEN RARE GRAM POSITIVE COCCI RARE Lonell Grandchild  POSITIVE RODS Performed at [facility], [place] [address] [place], [place] [address]    Culture PENDING  Incomplete   Report Status PENDING  Incomplete     Medications:   . carbamide peroxide  5 drop Right EAR BID  . enoxaparin (LOVENOX) injection  60 mg Subcutaneous Daily  . guaiFENesin  600 mg Oral BID  . ipratropium-albuterol  3 mL Nebulization Q6H  . methylPREDNISolone (SOLU-MEDROL) injection  60 mg Intravenous Q8H  . mometasone-formoterol  2 puff Inhalation BID  . umeclidinium bromide  1 puff Inhalation Daily   Continuous Infusions: . sodium chloride Stopped (06/13/18 1117)  . azithromycin Stopped (06/13/18 0045)  . cefTRIAXone (ROCEPHIN)  IV Stopped (06/13/18 0014)      LOS: 3 days   Florencia Reasons  [facility]  06/13/2018, 1:48 PM

## 2018-06-13 NOTE — Evaluation (Signed)
Occupational Therapy Evaluation and Discharge Patient Details Name: [name] MRN: [id number] DOB: 12-Jan-1961 Today's Date: 06/13/2018    History of Present Illness Pt admitted with SOB and fever 2/2 PNA. PMH: COPD, HTN. Pt reports recent R achilles tear.   Clinical Impression   Pt is functioning independently to modified independent. VSS on RA. No OT needs.    Follow Up Recommendations  No OT follow up    Equipment Recommendations  None recommended by OT    Recommendations for Other Services       Precautions / Restrictions Precautions Precautions: None Restrictions Weight Bearing Restrictions: No Other Position/Activity Restrictions: was wearing a CAM boot on R PTA      Mobility Bed Mobility Overal bed mobility: Modified Independent             General bed mobility comments: HOB up  Transfers Overall transfer level: Modified independent Equipment used: None                  Balance Overall balance assessment: Mild deficits observed, not formally tested                                         ADL either performed or assessed with clinical judgement   ADL Overall ADL's : Modified independent                                             Vision Patient Visual Report: No change from baseline       Perception     Praxis      Pertinent Vitals/Pain Pain Assessment: No/denies pain     Hand Dominance Right   Extremity/Trunk Assessment Upper Extremity Assessment Upper Extremity Assessment: Overall WFL for tasks assessed   Lower Extremity Assessment Lower Extremity Assessment: Defer to PT evaluation   Cervical / Trunk Assessment Cervical / Trunk Assessment: Normal   Communication Communication Communication: No difficulties   Cognition Arousal/Alertness: Awake/alert Behavior During Therapy: WFL for tasks assessed/performed Overall Cognitive Status: Within Functional Limits for tasks  assessed                                     General Comments       Exercises     Shoulder Instructions      Home Living Family/patient expects to be discharged to:: Private residence Living Arrangements: Children Available Help at Discharge: Family Type of Home: House Home Access: Level entry     Home Layout: One level     Bathroom Shower/Tub: Teacher, early years/pre: Handicapped height     Home Equipment: Radio producer - single point   Additional Comments: has CAM boot for R foot      Prior Functioning/Environment Level of Independence: Independent        Comments: retired from [employer] Problem List:        OT Treatment/Interventions:      OT Goals(Current goals can be found in the care plan section) Acute Rehab OT Goals Patient Stated Goal: return home  OT Frequency:     Barriers to D/C:  Co-evaluation              AM-PAC OT "6 Clicks" Daily Activity     Outcome Measure Help from another person eating meals?: None Help from another person taking care of personal grooming?: None Help from another person toileting, which includes using toliet, bedpan, or urinal?: None Help from another person bathing (including washing, rinsing, drying)?: None Help from another person to put on and taking off regular upper body clothing?: None Help from another person to put on and taking off regular lower body clothing?: None 6 Click Score: 24   End of Session    Activity Tolerance: Patient tolerated treatment well Patient left: in bed;with call bell/phone within reach  OT Visit Diagnosis: Other abnormalities of gait and mobility (R26.89)                Time: 1310-1320 OT Time Calculation (min): 10 min Charges:  OT General Charges $OT Visit: 1 Visit OT Evaluation $OT Eval Low Complexity: 1 Low  Nestor Lewandowsky, OTR/L Acute Rehabilitation Services Pager: [phone] Office: [phone]  [name] 06/13/2018, 2:25 PM

## 2018-06-13 NOTE — Progress Notes (Signed)
Just spoke on the phone with infection prevention. Covid-19 test result negative. Discontinuing airborne & contact precautions. Notified MICU charge RN.   Dewaine Oats, RN

## 2018-06-13 NOTE — Evaluation (Signed)
Physical Therapy Evaluation Patient Details Name: [name] MRN: [id number] DOB: [birth year] Today's Date: 06/13/2018   History of Present Illness  Pt admitted with SOB and fever 2/2 PNA. PMH: COPD, HTN. Pt reports recent R achilles tear.  Clinical Impression  Patient evaluated by Physical Therapy with no further acute PT needs identified. All education has been completed and the patient has no further questions. PTA, pt independent, ambulating unit without difficulty, VSS on RA.  See below for any follow-up Physical Therapy or equipment needs. PT is signing off. Thank you for this referral.      Follow Up Recommendations No PT follow up    Equipment Recommendations       Recommendations for Other Services       Precautions / Restrictions Precautions Precautions: None Restrictions Weight Bearing Restrictions: No Other Position/Activity Restrictions: was wearing a CAM boot on R PTA      Mobility  Bed Mobility Overal bed mobility: Modified Independent             General bed mobility comments: HOB up  Transfers Overall transfer level: Modified independent Equipment used: None                Ambulation/Gait Ambulation/Gait assistance: Modified independent (Device/Increase time) Gait Distance (Feet): 500 Feet   Gait Pattern/deviations: WFL(Within Functional Limits)        Stairs            Wheelchair Mobility    Modified Rankin (Stroke Patients Only)       Balance Overall balance assessment: Mild deficits observed, not formally tested                                           Pertinent Vitals/Pain Pain Assessment: No/denies pain    Home Living Family/patient expects to be discharged to:: Private residence Living Arrangements: Children Available Help at Discharge: Family Type of Home: House Home Access: Level entry     Home Layout: One level Home Equipment: Cane - single point Additional Comments: has  CAM boot for R foot    Prior Function Level of Independence: Independent         Comments: retired from [employer]   Dominant Hand: Right    Extremity/Trunk Assessment   Upper Extremity Assessment Upper Extremity Assessment: Overall WFL for tasks assessed    Lower Extremity Assessment Lower Extremity Assessment: Overall WFL for tasks assessed    Cervical / Trunk Assessment Cervical / Trunk Assessment: Normal  Communication   Communication: No difficulties  Cognition Arousal/Alertness: Awake/alert Behavior During Therapy: WFL for tasks assessed/performed Overall Cognitive Status: Within Functional Limits for tasks assessed                                        General Comments      Exercises     Assessment/Plan    PT Assessment Patent does not need any further PT services  PT Problem List         PT Treatment Interventions      PT Goals (Current goals can be found in the Care Plan section)  Acute Rehab PT Goals Patient Stated Goal: return home PT Goal Formulation: With patient Time For Goal Achievement: 06/27/18 Potential to Achieve Goals: Good  Frequency     Barriers to discharge        Co-evaluation               AM-PAC PT "6 Clicks" Mobility  Outcome Measure Help needed turning from your back to your side while in a flat bed without using bedrails?: None Help needed moving from lying on your back to sitting on the side of a flat bed without using bedrails?: None Help needed moving to and from a bed to a chair (including a wheelchair)?: None Help needed standing up from a chair using your arms (e.g., wheelchair or bedside chair)?: None Help needed to walk in hospital room?: None Help needed climbing 3-5 steps with a railing? : None 6 Click Score: 24    End of Session   Activity Tolerance: Patient tolerated treatment well Patient left: in bed Nurse Communication: Mobility status PT Visit Diagnosis:  Unsteadiness on feet (R26.81)    Time: 9292-4462 PT Time Calculation (min) (ACUTE ONLY): 9 min   Charges:   PT Evaluation $PT Eval Low Complexity: 1 Low          Reinaldo Berber, PT, DPT Acute Rehabilitation Services Pager: [phone] Office: [phone]    Reinaldo Berber 06/13/2018, 3:45 PM

## 2018-06-13 NOTE — Progress Notes (Signed)
RT offered pt BIPAP for the night and he declined. RT will continue to monitor.

## 2018-06-13 NOTE — Progress Notes (Signed)
TOC Team still following pt for further needs at the time of discharge.    Virgie Dad Tasfia Vasseur, MSW, [place] [facility] Clinical Social Worker [phone]

## 2018-06-14 MED ORDER — CARBAMIDE PEROXIDE 6.5 % OT SOLN: 5.0000 [drp] | Freq: Two times a day (BID) | OTIC | 0 refills | Status: DC

## 2018-06-14 MED ORDER — AMOXICILLIN-POT CLAVULANATE 875-125 MG PO TABS: 1.0000 | ORAL_TABLET | Freq: Two times a day (BID) | ORAL | 0 refills | Status: AC

## 2018-06-14 MED ORDER — DIPHENHYDRAMINE HCL 25 MG PO CAPS
25.0000 mg | ORAL_CAPSULE | Freq: Once | ORAL | Status: DC
  Filled 2018-06-14: qty 1

## 2018-06-14 MED ORDER — PREDNISONE 20 MG PO TABS: 40.0000 mg | ORAL_TABLET | Freq: Every day | ORAL | 0 refills | Status: AC

## 2018-06-14 MED ORDER — AMLODIPINE BESYLATE 5 MG PO TABS
5.0000 mg | ORAL_TABLET | Freq: Every day | ORAL | Status: DC
  Administered 2018-06-14: 5 mg via ORAL
  Filled 2018-06-14: qty 1

## 2018-06-14 MED ORDER — AMLODIPINE BESYLATE 5 MG PO TABS: 5.0000 mg | ORAL_TABLET | Freq: Every day | ORAL | 0 refills | Status: DC

## 2018-06-14 MED ORDER — PREDNISONE 20 MG PO TABS
40.0000 mg | ORAL_TABLET | Freq: Every day | ORAL | Status: DC
  Administered 2018-06-14: 40 mg via ORAL
  Filled 2018-06-14: qty 2

## 2018-06-14 MED ORDER — AMLODIPINE BESYLATE 5 MG PO TABS: 5.0000 mg | ORAL_TABLET | Freq: Every day | ORAL | Status: DC

## 2018-06-14 NOTE — TOC Transition Note (Signed)
Transition of Care [facility]) - CM/SW Discharge Note   Patient Details  Name: [name] MRN: [id number] Date of Birth: 08/24/1960  [facility] CM/SW Contact:  Carles Collet, RN Phone Number: 06/14/2018, 9:51 AM   Clinical Narrative:      Patient provided with [place] letter  Expected Discharge Plan and Services Barriers to Discharge: Barriers Resolved Expected Discharge Plan: Home/Self Care   Discharge Planning Services: Medication Assistance, [place] with [facility]  3/27 at 9:20am)   Living arrangements for the past 2 months: [place] Expected Discharge Date: 06/14/18                         Final next level of care: Home/Self Care Barriers to Discharge: Barriers Resolved   Patient Goals and CMS Choice        Discharge Placement                       Discharge Plan and Services   Discharge Planning Services: Medication Assistance, [facility](appointmen tmade with [place]  3/27 at 9:20am)                      Social Determinants of Health (SDOH) Interventions     Readmission Risk Interventions Readmission Risk Prevention Plan 06/14/2018  Post Dischage Appt Not Complete  Medication Screening Complete  Transportation Screening Complete  Some recent data might be hidden

## 2018-06-14 NOTE — Discharge Instructions (Signed)
Acute Respiratory Failure, Adult ° °Acute respiratory failure occurs when there is not enough oxygen passing from your lungs to your body. When this happens, your lungs have trouble removing carbon dioxide from the blood. This causes your blood oxygen level to drop too low as carbon dioxide builds up. °Acute respiratory failure is a medical emergency. It can develop quickly, but it is temporary if treated promptly. Your lung capacity, or how much air your lungs can hold, may improve with time, exercise, and treatment. °What are the causes? °There are many possible causes of acute respiratory failure, including: °· Lung injury. °· Chest injury or damage to the ribs or tissues near the lungs. °· Lung conditions that affect the flow of air and blood into and out of the lungs, such as pneumonia, acute respiratory distress syndrome, and cystic fibrosis. °· Medical conditions, such as strokes or spinal cord injuries, that affect the muscles and nerves that control breathing. °· Blood infection (sepsis). °· Inflammation of the pancreas (pancreatitis). °· A blood clot in the lungs (pulmonary embolism). °· A large-volume blood transfusion. °· Burns. °· Near-drowning. °· Seizure. °· Smoke inhalation. °· Reaction to medicines. °· Alcohol or drug overdose. °What increases the risk? °This condition is more likely to develop in people who have: °· A blocked airway. °· Asthma. °· A condition or disease that damages or weakens the muscles, nerves, bones, or tissues that are involved in breathing. °· A serious infection. °· A health problem that blocks the unconscious reflex that is involved in breathing, such as hypothyroidism or sleep apnea. °· A lung injury or trauma. °What are the signs or symptoms? °Trouble breathing is the main symptom of acute respiratory failure. Symptoms may also include: °· Rapid breathing. °· Restlessness or anxiety. °· Skin, lips, or fingernails that appear blue (cyanosis). °· Rapid heart  rate. °· Abnormal heart rhythms (arrhythmias). °· Confusion or changes in behavior. °· Tiredness or loss of energy. °· Feeling sleepy or having a loss of consciousness. °How is this diagnosed? °Your health care provider can diagnose acute respiratory failure with a medical history and physical exam. During the exam, your health care provider will listen to your heart and check for crackling or wheezing sounds in your lungs. Your may also have tests to confirm the diagnosis and determine what is causing respiratory failure. These tests may include: °· Measuring the amount of oxygen in your blood (pulse oximetry). The measurement comes from a small device that is placed on your finger, earlobe, or toe. °· Other blood tests to measure blood gases and to look for signs of infection. °· Sampling your cerebral spinal fluid or tracheal fluid to check for infections. °· Chest X-ray to look for fluid in spaces that should be filled with air. °· Electrocardiogram (ECG) to look at the heart's electrical activity. °How is this treated? °Treatment for this condition usually takes places in a hospital intensive care unit (ICU). Treatment depends on what is causing the condition. It may include one or more treatments until your symptoms improve. Treatment may include: °· Supplemental oxygen. Extra oxygen is given through a tube in the nose, a face mask, or a hood. °· A device such as a continuous positive airway pressure (CPAP) or bi-level positive airway pressure (BiPAP or BPAP) machine. This treatment uses mild air pressure to keep the airways open. A mask or other device will be placed over your nose or mouth. A tube that is connected to a motor will deliver oxygen through the   mask. °· Ventilator. This treatment helps move air into and out of the lungs. This may be done with a bag and mask or a machine. For this treatment, a tube is placed in your windpipe (trachea) so air and oxygen can flow to the lungs. °· Extracorporeal  membrane oxygenation (ECMO). This treatment temporarily takes over the function of the heart and lungs, supplying oxygen and removing carbon dioxide. ECMO gives the lungs a chance to recover. It may be used if a ventilator is not effective. °· Tracheostomy. This is a procedure that creates a hole in the neck to insert a breathing tube. °· Receiving fluids and medicines. °· Rocking the bed to help breathing. °Follow these instructions at home: °· Take over-the-counter and prescription medicines only as told by your health care provider. °· Return to normal activities as told by your health care provider. Ask your health care provider what activities are safe for you. °· Keep all follow-up visits as told by your health care provider. This is important. °How is this prevented? °Treating infections and medical conditions that may lead to acute respiratory failure can help prevent the condition from developing. °Contact a health care provider if: °· You have a fever. °· Your symptoms do not improve or they get worse. °Get help right away if: °· You are having trouble breathing. °· You lose consciousness. °· Your have cyanosis or turn blue. °· You develop a rapid heart rate. °· You are confused. °These symptoms may represent a serious problem that is an emergency. Do not wait to see if the symptoms will go away. Get medical help right away. Call your local emergency services (911 in the [place] Do not drive yourself to the hospital. °This information is not intended to replace advice given to you by your health care provider. Make sure you discuss any questions you have with your health care provider. °Document Released: 03/17/2013 Document Revised: 10/08/2015 Document Reviewed: 09/28/2015 °Elsevier Interactive Patient Education © 2019 [employer] ° °

## 2018-06-14 NOTE — Discharge Summary (Signed)
Discharge Summary  [name] Henne HQI:[id number] DOB: Jun 15, 1960  PCP: [name], MD  Admit date: 06/09/2018 Discharge date: 06/14/2018  Time spent: 35 minutes  Recommendations for Outpatient Follow-up:  1. Follow-up with your PCP 2. Take your medications as prescribed  Discharge Diagnoses:  Active Hospital Problems   Diagnosis Date Noted  . Community acquired bilateral lower lobe pneumonia ([place]) 06/10/2018  . Acute respiratory failure with hypoxia ([place]) 06/10/2018  . COPD with acute exacerbation ([place]) 06/10/2018  . Acute respiratory distress 12/20/2017  . COPD GOLD 0 / active smoker 04/26/2015    Resolved Hospital Problems  No resolved problems to display.    Discharge Condition: Stable  Diet recommendation: Resume previous diet  Vitals:   06/14/18 0725 06/14/18 0736  BP: (!) 172/96   Pulse: (!) 55   Resp:  18  Temp: (!) 97.5 F (36.4 C)   SpO2: 97%     History of present illness:  [name] is an 58 y.o. male past medical history of COPD not on oxygen, essential hypertension who comes in for a temperature of 101 at home with a productive cough that started about 4 days prior to admission, called EMS given Solu-Medrol and inhalers with mild improvement.  Admitted for acute respiratory failure with hypoxia secondary to community-acquired pneumonia. Respiratory panel negative, procalcitonin negative, Covid19 negative.  06/14/18: Patient seen and examined at his bedside.  No acute events overnight.  States he feels better.  His breathing and cough are improved.  He has no new complaints.  On the day of discharge, the patient was hemodynamically stable.  He will need to follow-up with his primary care provider and take his medications as prescribed.  Patient understands and agrees to plan.   Hospital Course:  Principal Problem:   Community acquired bilateral lower lobe pneumonia ([place]) Active Problems:   COPD GOLD 0 / active smoker   Acute  respiratory distress   Acute respiratory failure with hypoxia ([facility])   COPD with acute exacerbation ([facility])  Acute respiratory failure with hypoxia ([facility]) due to Community acquired bilateral lower lobe pneumonia ([place]) -Respiratory panel negative, procalcitonin negative. lymphocytes are within normal. blood culture no growth, mrsa screening negative, urine strep pneumo antigen negative  -COVID-19 negative.  -Initially he was not able to speak in full sentences, he has wheezing bilaterally on physical examination, Was on BiPAP , then on HFNC, then on nasal cannula, now on room air,  -Started empirically on Rocephin and azithromycin, clinically improving, changed to augmentin -IV Solu-Medrol switched to prednisone 40 mg daily x5 days -Continue Augmentin twice daily x5 days -Follow-up with your PCP posthospitalization -O2 saturation 97% on room air and maintaining good oxygen saturation on ambulation  COPD GOLD 0 / active smoker/COPD exacerbation: Continue prednisone and Augmentin  Right ear fullness: Denies ear pain Continue otic solution as needed  Essential hypertension: Not previously on meds Blood pressure persistently elevated Start amlodipine 5 mg daily Follow-up with PCP  Code Status:  Full code                Procedures:  None  Consultations:  None  Discharge Exam: BP (!) 172/96 (BP Location: Right Arm)   Pulse (!) 55   Temp (!) 97.5 F (36.4 C) (Oral)   Resp 18   Ht 6\' 2"  (1.88 m)   Wt 123.6 kg   SpO2 97%   BMI 34.99 kg/m  . General: 58 y.o. year-old male well developed well nourished in no acute distress.  Alert and oriented x3. . Cardiovascular: Regular rate and rhythm with no rubs or gallops.  No thyromegaly or JVD noted.   Marland Kitchen Respiratory: Clear to auscultation with no wheezes or rales. Good inspiratory effort. . Abdomen: Soft nontender nondistended with normal bowel sounds x4 quadrants. . Musculoskeletal: No lower extremity edema. 2/4 pulses in all 4  extremities. . Skin: No ulcerative lesions noted or rashes, . Psychiatry: Mood is appropriate for condition and setting  Discharge Instructions You were cared for by a hospitalist during your hospital stay. If you have any questions about your discharge medications or the care you received while you were in the hospital after you are discharged, you can call the unit and asked to speak with the hospitalist on call if the hospitalist that took care of you is not available. Once you are discharged, your primary care physician will handle any further medical issues. Please note that NO REFILLS for any discharge medications will be authorized once you are discharged, as it is imperative that you return to your primary care physician (or establish a relationship with a primary care physician if you do not have one) for your aftercare needs so that they can reassess your need for medications and monitor your lab values.   Allergies as of 06/14/2018   No Known Allergies     Medication List    TAKE these medications   acetaminophen 325 MG tablet Commonly known as:  TYLENOL Take 650 mg by mouth every 6 (six) hours as needed for mild pain.   albuterol 108 (90 Base) MCG/ACT inhaler Commonly known as:  PROVENTIL HFA;VENTOLIN HFA Inhale 1-2 puffs into the lungs every 6 (six) hours as needed for wheezing or shortness of breath.   amLODipine 5 MG tablet Commonly known as:  NORVASC Take 1 tablet (5 mg total) by mouth daily for 30 days.   amoxicillin-clavulanate 875-125 MG tablet Commonly known as:  AUGMENTIN Take 1 tablet by mouth every 12 (twelve) hours for 5 days.   budesonide-formoterol 80-4.5 MCG/ACT inhaler Commonly known as:  Symbicort Inhale 2 puffs into the lungs 2 (two) times daily.   carbamide peroxide 6.5 % OTIC solution Commonly known as:  DEBROX Place 5 drops into the right ear 2 (two) times daily.   ibuprofen 200 MG tablet Commonly known as:  ADVIL,MOTRIN Take 200 mg by mouth  every 6 (six) hours as needed for mild pain.   ipratropium-albuterol 0.5-2.5 (3) MG/3ML Soln Commonly known as:  DUONEB Take 3 mLs by nebulization 3 (three) times daily.   oxyCODONE-acetaminophen 10-325 MG tablet Commonly known as:  PERCOCET Take 1 tablet by mouth every 6 (six) hours as needed for pain.   predniSONE 20 MG tablet Commonly known as:  DELTASONE Take 2 tablets (40 mg total) by mouth daily with breakfast for 5 days.   traMADol 50 MG tablet Commonly known as:  ULTRAM Take 1 tablet (50 mg total) by mouth every 6 (six) hours as needed. What changed:  reasons to take this      No Known Allergies Follow-up Information    Pavelock, Ralene Bathe, MD. Call in 1 day(s).   Specialty:  Internal Medicine Why:  Please call for a post hospital follow-up appointment. Contact information: [address] [place] [address] [phone]            The results of significant diagnostics from this hospitalization (including imaging, microbiology, ancillary and laboratory) are listed below for reference.    Significant Diagnostic Studies:  Dg Chest 2 View  Result Date: 05/20/2018 CLINICAL DATA:  58 y/o  M; toothache and shortness of breath. EXAM: CHEST - 2 VIEW COMPARISON:  12/20/2017 chest radiograph FINDINGS: Stable heart size and mediastinal contours are within normal limits. Linear opacities in right mid and left lower lung zones are compatible with platelike atelectasis or scarring. No consolidation, effusion, or pneumothorax. No acute osseous abnormality is evident. IMPRESSION: No acute pulmonary process identified. Platelike atelectasis in right mid and left lower lung zones. Electronically Signed   By: Kristine Garbe M.D.   On: 05/20/2018 00:21   Ct Chest Wo Contrast  Result Date: 06/10/2018 CLINICAL DATA:  Respiratory distress EXAM: CT CHEST WITHOUT CONTRAST TECHNIQUE: Multidetector CT imaging of the chest was performed following the standard  protocol without IV contrast. COMPARISON:  Chest x-ray 06/09/2018 FINDINGS: Cardiovascular: Heart is normal size.  Aorta is normal caliber. Mediastinum/Nodes: No mediastinal, hilar, or axillary adenopathy. Lungs/Pleura: Bilateral lower lobe airspace opacities are present and are predominantly platelike, favor atelectasis although pneumonia cannot be completely excluded in the lung bases. No effusions. Upper Abdomen: Imaging into the upper abdomen shows no acute findings. Musculoskeletal: Chest wall soft tissues are unremarkable. No acute bony abnormality. IMPRESSION: Bilateral lower lobe opacities, predominantly platelike, favor atelectasis, but it is difficult to completely exclude pneumonia. Electronically Signed   By: Rolm Baptise M.D.   On: 06/10/2018 02:30   Dg Chest Port 1 View  Result Date: 06/09/2018 CLINICAL DATA:  Wheezing, cough, shortness of breath EXAM: PORTABLE CHEST 1 VIEW COMPARISON:  None. FINDINGS: Heart is borderline in size. Bibasilar airspace opacities. No visible significant effusions. No acute bony abnormality. IMPRESSION: Bibasilar atelectasis or infiltrates. Electronically Signed   By: Rolm Baptise M.D.   On: 06/09/2018 23:37    Microbiology: Recent Results (from the past 240 hour(s))  Blood culture (routine x 2)     Status: None (Preliminary result)   Collection Time: 06/09/18 11:00 PM  Result Value Ref Range Status   Specimen Description BLOOD RIGHT FOREARM  Final   Special Requests   Final    BOTTLES DRAWN AEROBIC AND ANAEROBIC Blood Culture adequate volume   Culture   Final    NO GROWTH 4 DAYS Performed at [facility], [address], [place], [place] [address]    Report Status PENDING  Incomplete  Blood culture (routine x 2)     Status: None (Preliminary result)   Collection Time: 06/09/18 11:25 PM  Result Value Ref Range Status   Specimen Description BLOOD LEFT HAND  Final   Special Requests   Final    BOTTLES DRAWN AEROBIC AND ANAEROBIC Blood Culture  adequate volume   Culture   Final    NO GROWTH 4 DAYS Performed at [facility], [place] [address], [place], [place] [address]    Report Status PENDING  Incomplete  Respiratory Panel by PCR     Status: None   Collection Time: 06/10/18  1:36 AM  Result Value Ref Range Status   Adenovirus NOT DETECTED NOT DETECTED Final   Coronavirus 229E NOT DETECTED NOT DETECTED Final    Comment: (NOTE) The Coronavirus on the Respiratory Panel, DOES NOT test for the novel  Coronavirus (2019 nCoV)    Coronavirus HKU1 NOT DETECTED NOT DETECTED Final   Coronavirus NL63 NOT DETECTED NOT DETECTED Final   Coronavirus OC43 NOT DETECTED NOT DETECTED Final   Metapneumovirus NOT DETECTED NOT DETECTED Final   Rhinovirus / Enterovirus NOT DETECTED NOT DETECTED Final  Influenza A NOT DETECTED NOT DETECTED Final   Influenza B NOT DETECTED NOT DETECTED Final   Parainfluenza Virus 1 NOT DETECTED NOT DETECTED Final   Parainfluenza Virus 2 NOT DETECTED NOT DETECTED Final   Parainfluenza Virus 3 NOT DETECTED NOT DETECTED Final   Parainfluenza Virus 4 NOT DETECTED NOT DETECTED Final   Respiratory Syncytial Virus NOT DETECTED NOT DETECTED Final   Bordetella pertussis NOT DETECTED NOT DETECTED Final   Chlamydophila pneumoniae NOT DETECTED NOT DETECTED Final   Mycoplasma pneumoniae NOT DETECTED NOT DETECTED Final    Comment: Performed at [facility], [place] [address] [place], [place] [address]  Novel Coronavirus, NAA (hospital order; send-out to ref lab)     Status: None   Collection Time: 06/10/18  1:36 AM  Result Value Ref Range Status   SARS-CoV-2, NAA Not Detected Not Detected Final    Comment: (NOTE) Testing was performed using the cobas(R) SARS-CoV-2 test. This test was developed and its performance characteristics determined by [employer]. This test has not been FDA cleared or approved. This test has been authorized by FDA under an Emergency Use Authorization (EUA). This test is only  authorized for the duration of time the declaration that circumstances exist justifying the authorization of the emergency use of in vitro diagnostic tests for detection of SARS-CoV-2 virus and/or diagnosis of COVID-19 infection under section 564(b)(1) of the Act, 21 U.S.C. 275TZG-0(F)(7), unless the authorization is terminated or revoked sooner. Performed At: [facility] [address] [place], [place] [id number] Rush Farmer MD FM:[id number]    Coronavirus Source NASAL SWAB  Final    Comment: Performed at [facility], [place] [address] [place], [place] [address]  MRSA PCR Screening     Status: None   Collection Time: 06/10/18  6:14 AM  Result Value Ref Range Status   MRSA by PCR NEGATIVE NEGATIVE Final    Comment:        The GeneXpert MRSA Assay (FDA approved for NASAL specimens only), is one component of a comprehensive MRSA colonization surveillance program. It is not intended to diagnose MRSA infection nor to guide or monitor treatment for MRSA infections. Performed at [facility], [place] [address] [place], [place] [address]   Culture, sputum-assessment     Status: None   Collection Time: 06/12/18 12:12 AM  Result Value Ref Range Status   Specimen Description EXPECTORATED SPUTUM  Final   Special Requests NONE  Final   Sputum evaluation   Final    THIS SPECIMEN IS ACCEPTABLE FOR SPUTUM CULTURE Performed at [facility], [place] [address], [place], [place] [address]    Report Status 06/13/2018 FINAL  Final  Culture, respiratory     Status: None (Preliminary result)   Collection Time: 06/12/18 12:12 AM  Result Value Ref Range Status   Specimen Description EXPECTORATED SPUTUM  Final   Special Requests NONE Reflexed from [id number]  Final   Gram Stain   Final    NO WBC SEEN RARE GRAM POSITIVE COCCI RARE GRAM POSITIVE RODS    Culture   Final    CULTURE REINCUBATED FOR BETTER GROWTH Performed at [facility], [place] [address],  [place], [place] [address]    Report Status PENDING  Incomplete     Labs: Basic Metabolic Panel: Recent Labs  Lab 06/09/18 2300 06/12/18 1647  NA 139 138  K 3.5 4.2  CL 107 111  CO2 18* 22  GLUCOSE 191* 104*  BUN  7 12  CREATININE 0.99 0.99  CALCIUM 9.2 8.6*  MG  --  2.2   Liver Function Tests: Recent Labs  Lab 06/09/18 2300  AST 21  ALT 25  ALKPHOS 66  BILITOT 0.7  PROT 7.0  ALBUMIN 3.9   No results for input(s): LIPASE, AMYLASE in the last 168 hours. No results for input(s): AMMONIA in the last 168 hours. CBC: Recent Labs  Lab 06/09/18 2300 06/12/18 1647  WBC 7.0 11.6*  NEUTROABS 2.5 10.1*  HGB 15.7 13.9  HCT 49.3 42.7  MCV 89.3 90.1  PLT 272 214   Cardiac Enzymes: Recent Labs  Lab 06/09/18 2300  TROPONINI <0.03   BNP: BNP (last 3 results) Recent Labs    06/09/18 2300  BNP 15.5    ProBNP (last 3 results) No results for input(s): PROBNP in the last 8760 hours.  CBG: Recent Labs  Lab 06/10/18 0558  GLUCAP 171*       Signed:  Kayleen Memos, MD [facility] 06/14/2018, 9:09 AM

## 2018-06-15 LAB — CULTURE, BLOOD (ROUTINE X 2)
CULTURE: NO GROWTH
Culture: NO GROWTH
SPECIAL REQUESTS: ADEQUATE
Special Requests: ADEQUATE

## 2018-06-15 LAB — CULTURE, RESPIRATORY W GRAM STAIN
Culture: NORMAL
Gram Stain: NONE SEEN

## 2018-06-20 ENCOUNTER — Ambulatory Visit: Payer: Self-pay | Admitting: Family Medicine

## 2018-09-18 ENCOUNTER — Ambulatory Visit: Payer: MEDICAID | Admitting: Podiatry

## 2018-10-09 ENCOUNTER — Ambulatory Visit (INDEPENDENT_AMBULATORY_CARE_PROVIDER_SITE_OTHER): Payer: Medicaid Other

## 2018-10-09 ENCOUNTER — Ambulatory Visit (INDEPENDENT_AMBULATORY_CARE_PROVIDER_SITE_OTHER): Payer: Medicaid Other | Admitting: Podiatry

## 2018-10-09 ENCOUNTER — Ambulatory Visit: Payer: Medicaid Other

## 2018-10-09 ENCOUNTER — Other Ambulatory Visit: Payer: Self-pay

## 2018-10-09 VITALS — Temp 97.7°F

## 2018-10-09 DIAGNOSIS — M7731 Calcaneal spur, right foot: Secondary | ICD-10-CM

## 2018-10-09 DIAGNOSIS — M216X2 Other acquired deformities of left foot: Secondary | ICD-10-CM

## 2018-10-09 DIAGNOSIS — M7732 Calcaneal spur, left foot: Secondary | ICD-10-CM

## 2018-10-09 DIAGNOSIS — M722 Plantar fascial fibromatosis: Secondary | ICD-10-CM

## 2018-10-09 DIAGNOSIS — M79671 Pain in right foot: Secondary | ICD-10-CM

## 2018-10-09 DIAGNOSIS — M216X1 Other acquired deformities of right foot: Secondary | ICD-10-CM

## 2018-10-09 DIAGNOSIS — M9261 Juvenile osteochondrosis of tarsus, right ankle: Secondary | ICD-10-CM

## 2018-10-09 NOTE — Patient Instructions (Signed)
Pre-Operative Instructions  Congratulations, you have decided to take an important step towards improving your quality of life.  You can be assured that the doctors and staff at [facility] will be with you every step of the way.  Here are some important things you should know:  1. Plan to be at the surgery center/hospital at least 1 (one) hour prior to your scheduled time, unless otherwise directed by the surgical center/hospital staff.  You must have a responsible adult accompany you, remain during the surgery and drive you home.  Make sure you have directions to the surgical center/hospital to ensure you arrive on time. 2. If you are having surgery at [facility] or [facility] hospitals, you will need a copy of your medical history and physical form from your family physician within one month prior to the date of surgery. We will give you a form for your primary physician to complete.  3. We make every effort to accommodate the date you request for surgery.  However, there are times where surgery dates or times have to be moved.  We will contact you as soon as possible if a change in schedule is required.   4. No aspirin/ibuprofen for one week before surgery.  If you are on aspirin, any non-steroidal anti-inflammatory medications (Mobic, Aleve, Ibuprofen) should not be taken seven (7) days prior to your surgery.  You make take Tylenol for pain prior to surgery.  5. Medications - If you are taking daily heart and blood pressure medications, seizure, reflux, allergy, asthma, anxiety, pain or diabetes medications, make sure you notify the surgery center/hospital before the day of surgery so they can tell you which medications you should take or avoid the day of surgery. 6. No food or drink after midnight the night before surgery unless directed otherwise by surgical center/hospital staff. 7. No alcoholic beverages 24-hours prior to surgery.  No smoking 24-hours prior or 24-hours after  surgery. 8. Wear loose pants or shorts. They should be loose enough to fit over bandages, boots, and casts. 9. Don't wear slip-on shoes. Sneakers are preferred. 10. Bring your boot with you to the surgery center/hospital.  Also bring crutches or a walker if your physician has prescribed it for you.  If you do not have this equipment, it will be provided for you after surgery. 11. If you have not been contacted by the surgery center/hospital by the day before your surgery, call to confirm the date and time of your surgery. 12. Leave-time from work may vary depending on the type of surgery you have.  Appropriate arrangements should be made prior to surgery with your employer. 13. Prescriptions will be provided immediately following surgery by your doctor.  Fill these as soon as possible after surgery and take the medication as directed. Pain medications will not be refilled on weekends and must be approved by the doctor. 14. Remove nail polish on the operative foot and avoid getting pedicures prior to surgery. 15. Wash the night before surgery.  The night before surgery wash the foot and leg well with water and the antibacterial soap provided. Be sure to pay special attention to beneath the toenails and in between the toes.  Wash for at least three (3) minutes. Rinse thoroughly with water and dry well with a towel.  Perform this wash unless told not to do so by your physician.  Enclosed: 1 Ice pack (please put in freezer the night before surgery)   1 Hibiclens skin cleaner     Pre-op instructions  If you have any questions regarding the instructions, please do not hesitate to call our office.  [place]: [address], [place], [place] [address] -- [phone]  [place]: [address] [place], [place] [address] -- [phone]  [place]: [address]  [place], [place] [address] -- [phone]  [place]: [address], [address], [place], [place] [address] -- [phone]  Website:  [email or web address] 

## 2018-10-10 ENCOUNTER — Telehealth: Payer: Self-pay | Admitting: *Deleted

## 2018-10-10 DIAGNOSIS — Z01812 Encounter for preprocedural laboratory examination: Secondary | ICD-10-CM

## 2018-10-10 NOTE — Telephone Encounter (Signed)
I left the patient a message that we have him scheduled for surgery on 10/22/2018 at [facility].  I informed him that he will need to have a Covid test done three days prior to his surgery date.  I also reminded him that he needs to have his history and physical form completed by his primary care physician.  I asked him to call me back if he has any questions.

## 2018-10-13 ENCOUNTER — Telehealth: Payer: Self-pay | Admitting: *Deleted

## 2018-10-13 NOTE — Telephone Encounter (Signed)
"  I received a call about having to get a Covid test before my surgery.  I haven't received a from nobody to schedule that appointment."  When are you scheduled for surgery?  "I'm scheduled for Thursday."  I have you scheduled for July 29, that's Thursday of next week.  You'll receive a call from the pre-admit scheduler.  "I was thinking July 29 was this Thursday."

## 2018-10-16 NOTE — Progress Notes (Signed)
Subjective:  Patient ID: [name], male    DOB: [birth year],  MRN: [id number]  Chief Complaint  Patient presents with  . Plantar Fasciitis    Pt states left heel has severe pain and is getting worse. Pt does not want injections and is here to discuss surgery.  . Foot Problem    Pt states right achilles knot that swells, 2 year duration.    58 y.o. male presents with the above complaint.  History reviewed as above   Review of Systems: Negative except as noted in the HPI. Denies N/V/F/Ch.  Past Medical History:  Diagnosis Date  . COPD (chronic obstructive pulmonary disease) ([place])   . Hypertension     Current Outpatient Medications:  .  acetaminophen (TYLENOL) 325 MG tablet, Take 650 mg by mouth every 6 (six) hours as needed for mild pain., Disp: , Rfl:  .  albuterol (PROVENTIL HFA;VENTOLIN HFA) 108 (90 Base) MCG/ACT inhaler, Inhale 1-2 puffs into the lungs every 6 (six) hours as needed for wheezing or shortness of breath., Disp: 1 Inhaler, Rfl: 0 .  budesonide-formoterol (SYMBICORT) 80-4.5 MCG/ACT inhaler, Inhale 2 puffs into the lungs 2 (two) times daily., Disp: 1 Inhaler, Rfl: 0 .  carbamide peroxide (DEBROX) 6.5 % OTIC solution, Place 5 drops into the right ear 2 (two) times daily., Disp: 15 mL, Rfl: 0 .  ibuprofen (ADVIL,MOTRIN) 200 MG tablet, Take 200 mg by mouth every 6 (six) hours as needed for mild pain., Disp: , Rfl:  .  ipratropium-albuterol (DUONEB) 0.5-2.5 (3) MG/3ML SOLN, Take 3 mLs by nebulization 3 (three) times daily., Disp: 360 mL, Rfl: 0 .  oxyCODONE-acetaminophen (PERCOCET) 10-325 MG tablet, Take 1 tablet by mouth every 6 (six) hours as needed for pain. , Disp: , Rfl:  .  sildenafil (REVATIO) 20 MG tablet, TAKE 1-2 TABLETS BY MOUTH 1 HOUR BEFORE SEXUAL ACTIVITY, Disp: , Rfl:  .  traMADol (ULTRAM) 50 MG tablet, Take 1 tablet (50 mg total) by mouth every 6 (six) hours as needed. (Patient taking differently: Take 50 mg by mouth every 6 (six) hours as needed  for moderate pain. ), Disp: 15 tablet, Rfl: 0 .  amLODipine (NORVASC) 5 MG tablet, Take 1 tablet (5 mg total) by mouth daily for 30 days., Disp: 30 tablet, Rfl: 0  Social History   Tobacco Use  Smoking Status Current Every Day Smoker  . Packs/day: 1.50  . Years: 28.00  . Pack years: 42.00  Smokeless Tobacco Never Used    No Known Allergies Objective:   Vitals:   10/09/18 1657  Temp: 97.7 F (36.5 C)   There is no height or weight on file to calculate BMI. Constitutional Well developed. Well nourished.  Vascular Dorsalis pedis pulses palpable bilaterally. Posterior tibial pulses palpable bilaterally. Capillary refill normal to all digits.  No cyanosis or clubbing noted. Pedal hair growth normal.  Neurologic Normal speech. Oriented to person, place, and time. Epicritic sensation to light touch grossly present bilaterally.  Dermatologic Nails well groomed and normal in appearance. No open wounds. No skin lesions.  Orthopedic:  Pain location about the left medial calcaneal tuber, ambulation about the right posterior calcaneal tuber; equinus contracture bilaterally ankle range of motion only to 0 degrees pain at the Achilles watershed region right   Radiographs: Taken and reviewed plantar posterior spurs bilaterally Assessment:   1. Plantar fasciitis   2. Pain in right foot   3. Acquired equinus deformity of both feet   4. Heel spur, left  5. Heel spur, right   6. Haglund's deformity, right    Plan:  Patient was evaluated and treated and all questions answered.  Plantar fasciitis left, equinus -X-rays reviewed -Discussed conservative versus surgical care for this patient has maximized injection therapy with outside provider.  He is looking for surgical invention.  We did discuss possible surgical correction -Patient has failed all conservative therapy and wishes to proceed with surgical intervention. All risks, benefits, and alternatives discussed with patient. No  guarantees given. Consent reviewed and signed by patient. -Planned procedures: Open vs endoscopic plantar fasciotomy left, possible heel spur resection, gastrocnemius recession.  Haglund deformity right -Hold off surgical invention until the left side resolved  No follow-ups on file.

## 2018-10-16 NOTE — H&P (View-Only) (Signed)
Subjective:  Patient ID: [name], male    DOB: 1961-03-03,  MRN: [id number]  Chief Complaint  Patient presents with  . Plantar Fasciitis    Pt states left heel has severe pain and is getting worse. Pt does not want injections and is here to discuss surgery.  . Foot Problem    Pt states right achilles knot that swells, 2 year duration.    58 y.o. male presents with the above complaint.  History reviewed as above   Review of Systems: Negative except as noted in the HPI. Denies N/V/F/Ch.  Past Medical History:  Diagnosis Date  . COPD (chronic obstructive pulmonary disease) ([place])   . Hypertension     Current Outpatient Medications:  .  acetaminophen (TYLENOL) 325 MG tablet, Take 650 mg by mouth every 6 (six) hours as needed for mild pain., Disp: , Rfl:  .  albuterol (PROVENTIL HFA;VENTOLIN HFA) 108 (90 Base) MCG/ACT inhaler, Inhale 1-2 puffs into the lungs every 6 (six) hours as needed for wheezing or shortness of breath., Disp: 1 Inhaler, Rfl: 0 .  budesonide-formoterol (SYMBICORT) 80-4.5 MCG/ACT inhaler, Inhale 2 puffs into the lungs 2 (two) times daily., Disp: 1 Inhaler, Rfl: 0 .  carbamide peroxide (DEBROX) 6.5 % OTIC solution, Place 5 drops into the right ear 2 (two) times daily., Disp: 15 mL, Rfl: 0 .  ibuprofen (ADVIL,MOTRIN) 200 MG tablet, Take 200 mg by mouth every 6 (six) hours as needed for mild pain., Disp: , Rfl:  .  ipratropium-albuterol (DUONEB) 0.5-2.5 (3) MG/3ML SOLN, Take 3 mLs by nebulization 3 (three) times daily., Disp: 360 mL, Rfl: 0 .  oxyCODONE-acetaminophen (PERCOCET) 10-325 MG tablet, Take 1 tablet by mouth every 6 (six) hours as needed for pain. , Disp: , Rfl:  .  sildenafil (REVATIO) 20 MG tablet, TAKE 1-2 TABLETS BY MOUTH 1 HOUR BEFORE SEXUAL ACTIVITY, Disp: , Rfl:  .  traMADol (ULTRAM) 50 MG tablet, Take 1 tablet (50 mg total) by mouth every 6 (six) hours as needed. (Patient taking differently: Take 50 mg by mouth every 6 (six) hours as needed  for moderate pain. ), Disp: 15 tablet, Rfl: 0 .  amLODipine (NORVASC) 5 MG tablet, Take 1 tablet (5 mg total) by mouth daily for 30 days., Disp: 30 tablet, Rfl: 0  Social History   Tobacco Use  Smoking Status Current Every Day Smoker  . Packs/day: 1.50  . Years: 28.00  . Pack years: 42.00  Smokeless Tobacco Never Used    No Known Allergies Objective:   Vitals:   10/09/18 1657  Temp: 97.7 F (36.5 C)   There is no height or weight on file to calculate BMI. Constitutional Well developed. Well nourished.  Vascular Dorsalis pedis pulses palpable bilaterally. Posterior tibial pulses palpable bilaterally. Capillary refill normal to all digits.  No cyanosis or clubbing noted. Pedal hair growth normal.  Neurologic Normal speech. Oriented to person, place, and time. Epicritic sensation to light touch grossly present bilaterally.  Dermatologic Nails well groomed and normal in appearance. No open wounds. No skin lesions.  Orthopedic:  Pain location about the left medial calcaneal tuber, ambulation about the right posterior calcaneal tuber; equinus contracture bilaterally ankle range of motion only to 0 degrees pain at the Achilles watershed region right   Radiographs: Taken and reviewed plantar posterior spurs bilaterally Assessment:   1. Plantar fasciitis   2. Pain in right foot   3. Acquired equinus deformity of both feet   4. Heel spur, left  5. Heel spur, right   6. Haglund's deformity, right    Plan:  Patient was evaluated and treated and all questions answered.  Plantar fasciitis left, equinus -X-rays reviewed -Discussed conservative versus surgical care for this patient has maximized injection therapy with outside provider.  He is looking for surgical invention.  We did discuss possible surgical correction -Patient has failed all conservative therapy and wishes to proceed with surgical intervention. All risks, benefits, and alternatives discussed with patient. No  guarantees given. Consent reviewed and signed by patient. -Planned procedures: Open vs endoscopic plantar fasciotomy left, possible heel spur resection, gastrocnemius recession.  Haglund deformity right -Hold off surgical invention until the left side resolved  No follow-ups on file.

## 2018-10-18 ENCOUNTER — Other Ambulatory Visit (HOSPITAL_COMMUNITY)
Admission: RE | Admit: 2018-10-18 | Discharge: 2018-10-18 | Disposition: A | Discharge status: Home / Self Care | Payer: Medicaid Other | Source: Ambulatory Visit | Attending: Podiatry | Admitting: Podiatry

## 2018-10-18 DIAGNOSIS — Z1159 Encounter for screening for other viral diseases: Secondary | ICD-10-CM | POA: Diagnosis present

## 2018-10-18 LAB — SARS CORONAVIRUS 2 (TAT 6-24 HRS): SARS Coronavirus 2: NEGATIVE

## 2018-10-20 ENCOUNTER — Encounter (HOSPITAL_BASED_OUTPATIENT_CLINIC_OR_DEPARTMENT_OTHER): Payer: Self-pay | Admitting: *Deleted

## 2018-10-20 ENCOUNTER — Other Ambulatory Visit: Payer: Self-pay

## 2018-10-20 ENCOUNTER — Telehealth: Payer: Self-pay | Admitting: *Deleted

## 2018-10-20 NOTE — Progress Notes (Addendum)
Spoke w/ pt via phone for pre-op interview.  Npo after mn.  Arrive at 0530.  Needs ekg.  Pt had covid test done 10-18-2018.  Pt had not taken blood pressure medication for past 3 months and no inhalers as prescribed, stated due to no insurance.  Pt denies sob or difficulty breathing since admission 03/ 2020 , but does have productive smoker's cough.  Have not received pt pcp h&p from dr price office.  Asked pt about having h&p form done that is his responsibility, was not aware but he will go to dr price office in am and get form and have his pcp fill it out.  Informed pt that he could have surgery without having his pcp complete form and returned to dr price office tomorrow afternoon, pt verbalized understanding.  Chart to be reviewed by anesthesia, Konrad Felix PA.  ADDENDUM:  Per anesthesia ok to proceed if copd is stable pt pcp H&P, Konrad Felix PA.   As of today 10-21-2018 @ 1920, have to received pt's h&p.

## 2018-10-20 NOTE — Telephone Encounter (Signed)
"  I called Mr. [name] about his surgery that's scheduled for July 29.  I asked him about his history and physical form.  He said nobody told him anything about a history and physical form.  I told him that it's required for the surgery and informed him if he doesn't have it, the surgery will be canceled.  He assured me that he could see the doctor in the morning.  I am not sure if he'll be able to get it completed tomorrow because he hasn't seen his primary care doctor nor the Pulmonologist in three years.  He also hasn't be using his inhaler or taking his prescribed medications.  So, I'm calling to let you know what's going on."  I'll let Dr. March Rummage know.  Thanks for letting us know.   I left messages for the patient to call me back.  "I received a message to call you."  I was calling you in regards to your history and physical form.  Have you had it completed by your primary care physician?  "I'm going in the morning.  I will drop it off by your office as soon as I'm finished there."  I will keep a look out for it tomorrow.

## 2018-10-21 MED ORDER — DEXTROSE 5 % IV SOLN
3.0000 g | INTRAVENOUS | Status: AC
  Administered 2018-10-22: 08:00:00 3 g via INTRAVENOUS
  Filled 2018-10-21 (×2): qty 3000

## 2018-10-21 NOTE — Progress Notes (Addendum)
Anesthesia Chart Review   Case: [id number] Date/Time: 10/22/18 0745   Procedures:      ENDOSCOPIC PLANTAR FASCIOTOMY (Left )     GASTROC RECESSION EXTREMITY (Left )     HEEL SPUR RESECTION (Left )   Anesthesia type: General   Pre-op diagnosis: PLANTAR FASCIITIS   Location: [facility] OR ROOM .5 / [place]   Surgeon: [name], DPM      DISCUSSION:57 y.o. current every day smoker (28 pack years) with h/o HTN, COPD, plantar fasciitis scheduled for above procedure 10/22/2018 with Hardie Pulley, DPM.   Low risk stress test 03/2015.  Echo 2/17 with EF 55-60%.    Admitted 3/16-3/21/2020 due to pneumonia, COPD exacerbation with respiratory failure.  Stable at discharge.  Denies current shortness of breath.  Experiencing chronic cough, stable.   Anticipate pt can proceed with planned procedure barring acute status change and after evaluation DOS (SDW).  VS: Ht 6\' 2"  (1.88 m)   Wt 122.5 kg   BMI 34.67 kg/m   PROVIDERS: [place], Ralene Bathe, MD is PCP    LABS: SDW (all labs ordered are listed, but only abnormal results are displayed)  Labs Reviewed - No data to display   IMAGES:   EKG: 06/10/2018 Rate 80 bpm Sinus rhythm  Left axis deviation   CV: Echo 04/27/15 Study Conclusions  - Left ventricle: The cavity size was normal. Wall thickness was   normal. Systolic function was normal. The estimated ejection   fraction was in the range of 55% to 60%. Wall motion was normal;   there were no regional wall motion abnormalities. Left   ventricular diastolic function parameters were normal. - Right atrium: The atrium was mildly dilated.  Impressions:  - Compared to the prior study, there has been no significant   interval change.  Myocardial Perfusion 04/26/15  IMPRESSION: 1. Inferior wall scar. Small, mild severity focus of reversibility in the inferior apical segment a potentially a small focus of inducible ischemia, but only measuring a 6% difference in  counts in this segment.  2. Normal left ventricular wall motion.  3. Left ventricular ejection fraction 61%  4. Low-risk stress test findings*. Past Medical History:  Diagnosis Date  . Cardiomyopathy ([place]) 2009   per cardiac cath 12-10-2007 ef 35-40%;    last echo 02-01-2017n ef 55-60%  . COPD (chronic obstructive pulmonary disease) ([place])    previously seen pulmonologist-- dr Melvyn Novas--- GOLD 0-- hx multiple exacerbation's last one 06-09-2018 CAP with [facility] (10-20-2018  per pt was followed by pcp and was clear;  pt was on symbicort bid and duoneb as needed per pt had not had it for past 3 months due to no insurance,  pt stated has productive smoker's cough but no sob or difficulty breathing)  . DDD (degenerative disc disease), cervical   . Heel spur, left   . History of nuclear stress test    epic 04-08-2015   low risk w/ small inferior wall scar of mild severity focus of reversible ischemia, ef 61%  . Hypertension    10-20-2018  per pt has not had medication , norvasc, in past 3 months due to no insurance  . Plantar fasciitis, left     Past Surgical History:  Procedure Laterality Date  . ACHILLES TENDON REPAIR Left 2015  . BICEPS TENDON REPAIR Right 2018  . CARDIAC CATHETERIZATION  12-10-2007  dr al little   no evidence cad, mild to moderate LVD, ef 35-40%  . HIP SURGERY Left  2005 approx.  Marland Kitchen KNEE SURGERY Bilateral x7   last one 2010  approx.    MEDICATIONS: . [START ON 10/22/2018] ceFAZolin (ANCEF) 3 g in dextrose 5 % 50 mL IVPB   . amLODipine (NORVASC) 5 MG tablet     [facility] Pre-Surgical Testing [phone] 10/21/18  11:59 AM

## 2018-10-22 ENCOUNTER — Ambulatory Visit (HOSPITAL_BASED_OUTPATIENT_CLINIC_OR_DEPARTMENT_OTHER): Payer: Medicaid Other | Admitting: Physician Assistant

## 2018-10-22 ENCOUNTER — Encounter (HOSPITAL_BASED_OUTPATIENT_CLINIC_OR_DEPARTMENT_OTHER): Payer: Self-pay

## 2018-10-22 ENCOUNTER — Other Ambulatory Visit: Payer: Self-pay

## 2018-10-22 ENCOUNTER — Encounter (HOSPITAL_BASED_OUTPATIENT_CLINIC_OR_DEPARTMENT_OTHER)
Admission: RE | Disposition: A | Discharge status: Home / Self Care | Payer: Self-pay | Source: Home / Self Care | Attending: Podiatry

## 2018-10-22 ENCOUNTER — Ambulatory Visit (HOSPITAL_COMMUNITY): Payer: Medicaid Other

## 2018-10-22 ENCOUNTER — Ambulatory Visit (HOSPITAL_BASED_OUTPATIENT_CLINIC_OR_DEPARTMENT_OTHER)
Admission: RE | Admit: 2018-10-22 | Discharge: 2018-10-22 | Disposition: A | Discharge status: Home / Self Care | Payer: Medicaid Other | Attending: Podiatry | Admitting: Podiatry

## 2018-10-22 DIAGNOSIS — J449 Chronic obstructive pulmonary disease, unspecified: Secondary | ICD-10-CM | POA: Insufficient documentation

## 2018-10-22 DIAGNOSIS — Z79899 Other long term (current) drug therapy: Secondary | ICD-10-CM | POA: Diagnosis not present

## 2018-10-22 DIAGNOSIS — M722 Plantar fascial fibromatosis: Secondary | ICD-10-CM | POA: Diagnosis not present

## 2018-10-22 DIAGNOSIS — M216X1 Other acquired deformities of right foot: Secondary | ICD-10-CM | POA: Insufficient documentation

## 2018-10-22 DIAGNOSIS — M7732 Calcaneal spur, left foot: Secondary | ICD-10-CM

## 2018-10-22 DIAGNOSIS — M79671 Pain in right foot: Secondary | ICD-10-CM | POA: Insufficient documentation

## 2018-10-22 DIAGNOSIS — I2510 Atherosclerotic heart disease of native coronary artery without angina pectoris: Secondary | ICD-10-CM | POA: Diagnosis not present

## 2018-10-22 DIAGNOSIS — M7731 Calcaneal spur, right foot: Secondary | ICD-10-CM | POA: Diagnosis not present

## 2018-10-22 DIAGNOSIS — Z7951 Long term (current) use of inhaled steroids: Secondary | ICD-10-CM | POA: Diagnosis not present

## 2018-10-22 DIAGNOSIS — M216X2 Other acquired deformities of left foot: Secondary | ICD-10-CM | POA: Diagnosis not present

## 2018-10-22 DIAGNOSIS — Z9889 Other specified postprocedural states: Secondary | ICD-10-CM

## 2018-10-22 DIAGNOSIS — M7661 Achilles tendinitis, right leg: Secondary | ICD-10-CM | POA: Diagnosis not present

## 2018-10-22 DIAGNOSIS — I10 Essential (primary) hypertension: Secondary | ICD-10-CM | POA: Insufficient documentation

## 2018-10-22 DIAGNOSIS — F17210 Nicotine dependence, cigarettes, uncomplicated: Secondary | ICD-10-CM | POA: Insufficient documentation

## 2018-10-22 HISTORY — PX: PLANTAR FASCIA RELEASE: SHX2239

## 2018-10-22 HISTORY — DX: Plantar fascial fibromatosis: M72.2

## 2018-10-22 HISTORY — DX: Personal history of other medical treatment: Z92.89

## 2018-10-22 HISTORY — PX: HEEL SPUR RESECTION: SHX6410

## 2018-10-22 HISTORY — DX: Other cervical disc degeneration, unspecified cervical region: M50.30

## 2018-10-22 HISTORY — PX: GASTROC RECESSION EXTREMITY: SHX6262

## 2018-10-22 SURGERY — FASCIOTOMY, PLANTAR, ENDOSCOPIC
Anesthesia: General | Site: Foot | Laterality: Left

## 2018-10-22 MED ORDER — ONDANSETRON HCL 4 MG/2ML IJ SOLN
INTRAMUSCULAR | Status: AC
  Filled 2018-10-22: qty 2

## 2018-10-22 MED ORDER — OXYCODONE-ACETAMINOPHEN 10-325 MG PO TABS: 1.0000 | ORAL_TABLET | ORAL | 0 refills | Status: DC | PRN

## 2018-10-22 MED ORDER — ARTIFICIAL TEARS OPHTHALMIC OINT
TOPICAL_OINTMENT | OPHTHALMIC | Status: AC
  Filled 2018-10-22: qty 3.5

## 2018-10-22 MED ORDER — LACTATED RINGERS IV SOLN
INTRAVENOUS | Status: DC
  Administered 2018-10-22: 50 mL via INTRAVENOUS
  Administered 2018-10-22: 09:00:00 via INTRAVENOUS
  Filled 2018-10-22: qty 1000

## 2018-10-22 MED ORDER — 0.9 % SODIUM CHLORIDE (POUR BTL) OPTIME
TOPICAL | Status: DC | PRN
  Administered 2018-10-22: 07:00:00 500 mL

## 2018-10-22 MED ORDER — MIDAZOLAM HCL 2 MG/2ML IJ SOLN
INTRAMUSCULAR | Status: AC
  Filled 2018-10-22: qty 2

## 2018-10-22 MED ORDER — LIDOCAINE 2% (20 MG/ML) 5 ML SYRINGE
INTRAMUSCULAR | Status: DC | PRN
  Administered 2018-10-22: 100 mg via INTRAVENOUS

## 2018-10-22 MED ORDER — FENTANYL CITRATE (PF) 100 MCG/2ML IJ SOLN
INTRAMUSCULAR | Status: AC
  Filled 2018-10-22: qty 2

## 2018-10-22 MED ORDER — KETOROLAC TROMETHAMINE 30 MG/ML IJ SOLN
INTRAMUSCULAR | Status: DC | PRN
  Administered 2018-10-22: 30 mg via INTRAVENOUS

## 2018-10-22 MED ORDER — CEFAZOLIN SODIUM-DEXTROSE 1-4 GM/50ML-% IV SOLN
INTRAVENOUS | Status: AC
  Filled 2018-10-22: qty 50

## 2018-10-22 MED ORDER — ONDANSETRON HCL 4 MG/2ML IJ SOLN
INTRAMUSCULAR | Status: DC | PRN
  Administered 2018-10-22: 4 mg via INTRAVENOUS

## 2018-10-22 MED ORDER — ACETAMINOPHEN 500 MG PO TABS
1000.0000 mg | ORAL_TABLET | Freq: Once | ORAL | Status: AC
  Administered 2018-10-22: 1000 mg via ORAL
  Filled 2018-10-22: qty 2

## 2018-10-22 MED ORDER — LIDOCAINE 2% (20 MG/ML) 5 ML SYRINGE
INTRAMUSCULAR | Status: AC
  Filled 2018-10-22: qty 5

## 2018-10-22 MED ORDER — FENTANYL CITRATE (PF) 100 MCG/2ML IJ SOLN
25.0000 ug | INTRAMUSCULAR | Status: DC | PRN
  Administered 2018-10-22 (×2): 50 ug via INTRAVENOUS
  Filled 2018-10-22: qty 1

## 2018-10-22 MED ORDER — DEXAMETHASONE SODIUM PHOSPHATE 10 MG/ML IJ SOLN
INTRAMUSCULAR | Status: AC
  Filled 2018-10-22: qty 1

## 2018-10-22 MED ORDER — PROPOFOL 10 MG/ML IV BOLUS
INTRAVENOUS | Status: DC | PRN
  Administered 2018-10-22: 200 mg via INTRAVENOUS

## 2018-10-22 MED ORDER — DEXAMETHASONE SODIUM PHOSPHATE 4 MG/ML IJ SOLN
INTRAMUSCULAR | Status: DC | PRN
  Administered 2018-10-22: 5 mg via INTRAVENOUS

## 2018-10-22 MED ORDER — BUPIVACAINE-EPINEPHRINE 0.5% -1:200000 IJ SOLN
INTRAMUSCULAR | Status: DC | PRN
  Administered 2018-10-22: 30 mL

## 2018-10-22 MED ORDER — CEFAZOLIN SODIUM-DEXTROSE 2-4 GM/100ML-% IV SOLN
INTRAVENOUS | Status: AC
  Filled 2018-10-22: qty 100

## 2018-10-22 MED ORDER — KETOROLAC TROMETHAMINE 30 MG/ML IJ SOLN
INTRAMUSCULAR | Status: AC
  Filled 2018-10-22: qty 1

## 2018-10-22 MED ORDER — BUPIVACAINE HCL (PF) 0.5 % IJ SOLN: INTRAMUSCULAR | Status: DC | PRN

## 2018-10-22 MED ORDER — FENTANYL CITRATE (PF) 100 MCG/2ML IJ SOLN
INTRAMUSCULAR | Status: DC | PRN
  Administered 2018-10-22 (×2): 50 ug via INTRAVENOUS

## 2018-10-22 MED ORDER — MIDAZOLAM HCL 5 MG/5ML IJ SOLN
INTRAMUSCULAR | Status: DC | PRN
  Administered 2018-10-22: 2 mg via INTRAVENOUS

## 2018-10-22 MED ORDER — CEPHALEXIN 500 MG PO CAPS: 500.0000 mg | ORAL_CAPSULE | Freq: Two times a day (BID) | ORAL | 0 refills | Status: AC

## 2018-10-22 MED ORDER — ACETAMINOPHEN 500 MG PO TABS
ORAL_TABLET | ORAL | Status: AC
  Filled 2018-10-22: qty 2

## 2018-10-22 MED ORDER — PROPOFOL 10 MG/ML IV BOLUS
INTRAVENOUS | Status: AC
  Filled 2018-10-22: qty 40

## 2018-10-22 SURGICAL SUPPLY — 61 items
APL PRP STRL LF DISP 70% ISPRP (MISCELLANEOUS) ×1
APL SKNCLS STERI-STRIP NONHPOA (GAUZE/BANDAGES/DRESSINGS) ×1
APL SWBSTK 6 STRL LF DISP (MISCELLANEOUS) ×1
APPLICATOR COTTON TIP 6 STRL (MISCELLANEOUS) IMPLANT
APPLICATOR COTTON TIP 6IN STRL (MISCELLANEOUS) ×3
BANDAGE ESMARK 6X9 LF (GAUZE/BANDAGES/DRESSINGS) IMPLANT
BENZOIN TINCTURE PRP APPL 2/3 (GAUZE/BANDAGES/DRESSINGS) ×3 IMPLANT
BLADE CLIPPER SURG (BLADE) IMPLANT
BLADE HOOK ENDO STRL (BLADE) ×2 IMPLANT
BLADE OSC/SAG .038X5.5 CUT EDG (BLADE) ×2 IMPLANT
BLADE SURG 15 STRL LF DISP TIS (BLADE) ×1 IMPLANT
BLADE SURG 15 STRL SS (BLADE) ×3
BNDG CMPR 9X4 STRL LF SNTH (GAUZE/BANDAGES/DRESSINGS)
BNDG CMPR 9X6 STRL LF SNTH (GAUZE/BANDAGES/DRESSINGS)
BNDG ELASTIC 4X5.8 VLCR STR LF (GAUZE/BANDAGES/DRESSINGS) ×3 IMPLANT
BNDG ESMARK 4X9 LF (GAUZE/BANDAGES/DRESSINGS) IMPLANT
BNDG ESMARK 6X9 LF (GAUZE/BANDAGES/DRESSINGS)
BNDG GAUZE ELAST 4 BULKY (GAUZE/BANDAGES/DRESSINGS) ×3 IMPLANT
CHLORAPREP W/TINT 26 (MISCELLANEOUS) ×3 IMPLANT
CLOSURE WOUND 1/2 X4 (GAUZE/BANDAGES/DRESSINGS)
COVER WAND RF STERILE (DRAPES) ×3 IMPLANT
CUFF TOURN SGL QUICK 24 (TOURNIQUET CUFF)
CUFF TRNQT CYL 24X4X16.5-23 (TOURNIQUET CUFF) IMPLANT
DECANTER SPIKE VIAL GLASS SM (MISCELLANEOUS) ×2 IMPLANT
DRAPE EXTREMITY T 121X128X90 (DISPOSABLE) ×3 IMPLANT
DRAPE U-SHAPE 47X51 STRL (DRAPES) ×3 IMPLANT
DRSG EMULSION OIL 3X3 NADH (GAUZE/BANDAGES/DRESSINGS) ×3 IMPLANT
DRSG PAD ABDOMINAL 8X10 ST (GAUZE/BANDAGES/DRESSINGS) ×4 IMPLANT
ELECT REM PT RETURN 9FT ADLT (ELECTROSURGICAL) ×3
ELECTRODE REM PT RTRN 9FT ADLT (ELECTROSURGICAL) ×1 IMPLANT
GAUZE SPONGE 4X4 12PLY STRL (GAUZE/BANDAGES/DRESSINGS) ×2 IMPLANT
GLOVE BIOGEL PI IND STRL 8 (GLOVE) ×1 IMPLANT
GLOVE BIOGEL PI IND STRL 8.5 (GLOVE) IMPLANT
GLOVE BIOGEL PI INDICATOR 8 (GLOVE) ×2
GLOVE BIOGEL PI INDICATOR 8.5 (GLOVE) ×4
GLOVE ECLIPSE 7.5 STRL STRAW (GLOVE) ×4 IMPLANT
GLOVE SURG SS PI 8.5 STRL IVOR (GLOVE) ×4
GLOVE SURG SS PI 8.5 STRL STRW (GLOVE) IMPLANT
GOWN STRL REUS W/ TWL LRG LVL3 (GOWN DISPOSABLE) ×1 IMPLANT
GOWN STRL REUS W/ TWL XL LVL3 (GOWN DISPOSABLE) ×2 IMPLANT
GOWN STRL REUS W/TWL LRG LVL3 (GOWN DISPOSABLE)
GOWN STRL REUS W/TWL XL LVL3 (GOWN DISPOSABLE) ×9
HOLDER KNEE FOAM BLUE (MISCELLANEOUS) IMPLANT
KIT TURNOVER CYSTO (KITS) ×3 IMPLANT
NEEDLE HYPO 22GX1.5 SAFETY (NEEDLE) ×2 IMPLANT
NS IRRIG 1000ML POUR BTL (IV SOLUTION) ×3 IMPLANT
PACK ARTHROSCOPY DSU (CUSTOM PROCEDURE TRAY) ×3 IMPLANT
PACK BASIN DAY SURGERY FS (CUSTOM PROCEDURE TRAY) ×3 IMPLANT
PACK ORTHO EXTREMITY (CUSTOM PROCEDURE TRAY) ×3 IMPLANT
PAD ARMBOARD 7.5X6 YLW CONV (MISCELLANEOUS) ×4 IMPLANT
PAD CAST 4YDX4 CTTN HI CHSV (CAST SUPPLIES) ×1 IMPLANT
PADDING CAST COTTON 4X4 STRL (CAST SUPPLIES) ×3
RASP CROSS CUT 5.5X9.5MM (MISCELLANEOUS) ×2 IMPLANT
RASP SM TEAR CROSS CUT (RASP) ×4 IMPLANT
SPONGE LAP 4X18 RFD (DISPOSABLE) ×3 IMPLANT
STAPLER VISISTAT (STAPLE) ×2 IMPLANT
STRIP CLOSURE SKIN 1/2X4 (GAUZE/BANDAGES/DRESSINGS) ×1 IMPLANT
STRYKER THIN OFFSET ×2 IMPLANT
SUT ETHILON 4 0 PS 2 18 (SUTURE) ×2 IMPLANT
SUT VIC AB 3-0 FS2 27 (SUTURE) ×1 IMPLANT
SYR CONTROL 10ML LL (SYRINGE) ×2 IMPLANT

## 2018-10-22 NOTE — Interval H&P Note (Signed)
History and Physical Interval Note:  10/22/2018 7:54 AM  [name]  has presented today for surgery, with the diagnosis of PLANTAR FASCIITIS.  The various methods of treatment have been discussed with the patient and family. After consideration of risks, benefits and other options for treatment, the patient has consented to  Procedure(s): ENDOSCOPIC PLANTAR FASCIOTOMY (Left) GASTROC RECESSION EXTREMITY (Left) HEEL SPUR RESECTION (Left) as a surgical intervention.  The patient's history has been reviewed, patient examined, no change in status, stable for surgery.  I have reviewed the patient's chart and labs.  Questions were answered to the patient's satisfaction.     [name]

## 2018-10-22 NOTE — Brief Op Note (Signed)
10/22/2018  9:13 AM  PATIENT:  [name]  58 y.o. male  PRE-OPERATIVE DIAGNOSIS:  PLANTAR FASCIITIS  POST-OPERATIVE DIAGNOSIS:  PLANTAR FASCIITIS  PROCEDURE:  Procedure(s): ENDOSCOPIC PLANTAR FASCIOTOMY (Left) GASTROC RECESSION EXTREMITY (Left) HEEL SPUR RESECTION (Left)  SURGEON:  Surgeon(s) and Role:    * [name], DPM - Primary  PHYSICIAN ASSISTANT:   ASSISTANTS: none   ANESTHESIA:   local and MAC  EBL:  25 ml   BLOOD ADMINISTERED:none  DRAINS: none   LOCAL MEDICATIONS USED:  MARCAINE 0.5% with epi   and Amount: 30 ml  SPECIMEN:  No Specimen  DISPOSITION OF SPECIMEN:  N/A  COUNTS:  YES  TOURNIQUET:  * No tourniquets in log *  DICTATION: .Dragon Dictation  PLAN OF CARE: Discharge to home after PACU  PATIENT DISPOSITION:  PACU - hemodynamically stable.   Delay start of Pharmacological VTE agent (>24hrs) due to surgical blood loss or risk of bleeding: n/a

## 2018-10-22 NOTE — Op Note (Signed)
Patient Name: [name] DOB: May 01, 1960  MRN: [id number]   Date of Service: 10/22/2018  Surgeon: Dr. Hardie Pulley, DPM Assistants: None Pre-operative Diagnosis:  Plantar fasciitis, heel spur, equinus Post-operative Diagnosis:   Same Procedures:  1) gastrocnemius recession left  2) Endoscopic plantar fasciotomy left  3) Inferior heel spur resection left 4) Manipulation of ankle under anesthesia left  Pathology/Specimens: * No specimens in log * Anesthesia: General Hemostasis: * No tourniquets in log * Estimated Blood Loss: 25 mL Materials: * No implants in log * Medications: Half percent Marcaine with epinephrine 30 cc Complications: None  Indications for Procedure:  This is a 58 y.o. male with a chronic heel pain and equinus to the left foot.  He presents for elective surgical correction after failing all conservative therapy.   Procedure in Detail: Patient was identified in pre-operative holding area. Formal consent was signed and the left lower extremity was marked. Patient was brought back to the operating room. Anesthesia was induced. The extremity was prepped and draped in the usual sterile fashion. Timeout was taken to confirm patient name, laterality, and procedure prior to incision.   Attention was then directed to the left foot.  The ankle was brought the range of motion was only able to get to 0 degrees.  Local infiltration with Marcaine was administered.  A linear incision was made about the posterior aspect left calf centrally 4 fingerbreadths distal to the muscle belly of the gastrocnemius.  Dissection was carried down through skin and subcu tissue to the level of the gastric fascia.  Blunt finger dissection was performed.  A transverse incision was made in the gastrocnemius fascia and the fascia was separated exposing the underlying muscle belly.  The sural nerve was visualized and retracted safely from view.  The fascia was incised medially and laterally.   This was palpated to ensure complete release.  The incision was then thoroughly irrigated and closed in layers with 3-0 Vicryl and skin staples  The ankle was then thoroughly manipulated to break up capsular adhesions and to increase the range of motion.  Post thorough manipulation the ankle was able to brought to 10 degrees of dorsiflexion.  Attention was then directed to the plantar heel.  A small incision was made at the medial aspect of the calcaneus about the area of the plantar fascia.  An elevator was used to release a soft tissue plane inferior to the plantar fascia a trocar and cannula was placed inferior to the plantar fascia.  A 4 mm endoscope was then placed through the cannula and the plantar fascia was visualized.  Under direct visualization the medial one third of the plantar fascia was incised with a hook blade.  After ensuring that the fascia was completely incised the trocar and cannula was removed.  Under direct fluoroscopic visualization the calcaneal heel spur was then thoroughly reduced with a power rasp.  Fluoroscopic images confirmed reduction.  All incisions were then copiously irrigated and closed with skin staples.  The foot was then dressed with Adaptic 4 x 4 Kerlix and Ace bandage. Patient tolerated the procedure well.   Disposition: Following a period of post-operative monitoring, patient will be transferred home.Marland Kitchen

## 2018-10-22 NOTE — Transfer of Care (Signed)
Last Vitals:  Vitals Value Taken Time  BP 163/93 10/22/18 0920  Temp    Pulse 76 10/22/18 0922  Resp 18 10/22/18 0922  SpO2 97 % 10/22/18 0922  Vitals shown include unvalidated device data.  Last Pain:  Vitals:   10/22/18 0556  TempSrc:   PainSc: 5       Patients Stated Pain Goal: 1 (10/22/18 0556)  Immediate Anesthesia Transfer of Care Note  Patient: [name]  Procedure(s) Performed: Procedure(s) (LRB): ENDOSCOPIC PLANTAR FASCIOTOMY (Left) GASTROC RECESSION EXTREMITY (Left) HEEL SPUR RESECTION (Left)  Patient Location: PACU  Anesthesia Type: General  Level of Consciousness: awake, alert  and oriented  Airway & Oxygen Therapy: Patient Spontanous Breathing and Patient connected to nasal cannula oxygen  Post-op Assessment: Report given to PACU RN and Post -op Vital signs reviewed and stable  Post vital signs: Reviewed and stable  Complications: No apparent anesthesia complications

## 2018-10-22 NOTE — Discharge Instructions (Signed)

## 2018-10-22 NOTE — Anesthesia Postprocedure Evaluation (Signed)
Anesthesia Post Note  Patient: [name]  Procedure(s) Performed: ENDOSCOPIC PLANTAR FASCIOTOMY (Left Foot) GASTROC RECESSION EXTREMITY (Left Foot) HEEL SPUR RESECTION (Left Foot)     Patient location during evaluation: PACU Anesthesia Type: General Level of consciousness: awake and alert Pain management: pain level controlled Vital Signs Assessment: post-procedure vital signs reviewed and stable Respiratory status: spontaneous breathing, nonlabored ventilation, respiratory function stable and patient connected to nasal cannula oxygen Cardiovascular status: blood pressure returned to baseline and stable Postop Assessment: no apparent nausea or vomiting Anesthetic complications: no    Last Vitals:  Vitals:   10/22/18 1015 10/22/18 1050  BP: 136/81 (!) 158/82  Pulse: (!) 56 60  Resp: 18 20  Temp:  36.6 C  SpO2: 94% 98%    Last Pain:  Vitals:   10/22/18 1015  TempSrc:   PainSc: 5                  Chelsey L Woodrum

## 2018-10-22 NOTE — Anesthesia Preprocedure Evaluation (Addendum)
Anesthesia Evaluation  Patient identified by MRN, date of birth, ID band Patient awake    Reviewed: Allergy & Precautions, NPO status , Patient's Chart, lab work & pertinent test results  Airway Mallampati: II  TM Distance: >3 FB Neck ROM: Full    Dental no notable dental hx. (+) Partial Upper, Dental Advisory Given   Pulmonary COPD, Current Smoker,    Pulmonary exam normal breath sounds clear to auscultation       Cardiovascular hypertension, + CAD  Normal cardiovascular exam Rhythm:Regular Rate:Normal  TTE 2017 Unremarkable  Stress Test 2017 1. Inferior wall scar. Small, mild severity focus of reversibility in the inferior apical segment a potentially a small focus of inducible ischemia, but only measuring a 6% difference in counts in this segment. 2. Normal left ventricular wall motion. 3. Left ventricular ejection fraction 61% 4. Low-risk stress test findings*.   Neuro/Psych  Headaches, negative psych ROS   GI/Hepatic negative GI ROS, Neg liver ROS,   Endo/Other  negative endocrine ROS  Renal/GU negative Renal ROS  negative genitourinary   Musculoskeletal  (+) Arthritis ,   Abdominal   Peds  Hematology negative hematology ROS (+)   Anesthesia Other Findings   Reproductive/Obstetrics                            Anesthesia Physical Anesthesia Plan  ASA: III  Anesthesia Plan: General   Post-op Pain Management:    Induction: Intravenous  PONV Risk Score and Plan: 1 and Dexamethasone, Ondansetron and Midazolam  Airway Management Planned: LMA  Additional Equipment:   Intra-op Plan:   Post-operative Plan: Extubation in OR  Informed Consent: I have reviewed the patients History and Physical, chart, labs and discussed the procedure including the risks, benefits and alternatives for the proposed anesthesia with the patient or authorized representative who has indicated his/her  understanding and acceptance.     Dental advisory given  Plan Discussed with: CRNA  Anesthesia Plan Comments:         Anesthesia Quick Evaluation

## 2018-10-22 NOTE — Anesthesia Procedure Notes (Signed)
Procedure Name: LMA Insertion Date/Time: 10/22/2018 8:16 AM Performed by: Freddrick March, MD Pre-anesthesia Checklist: Patient identified, Emergency Drugs available, Suction available and Patient being monitored Patient Re-evaluated:Patient Re-evaluated prior to induction Oxygen Delivery Method: Circle system utilized Preoxygenation: Pre-oxygenation with 100% oxygen Induction Type: IV induction Ventilation: Mask ventilation without difficulty LMA: LMA inserted LMA Size: 5.0 Number of attempts: 1 Airway Equipment and Method: Bite block Placement Confirmation: positive ETCO2 Tube secured with: Tape Dental Injury: Teeth and Oropharynx as per pre-operative assessment

## 2018-10-23 ENCOUNTER — Ambulatory Visit (INDEPENDENT_AMBULATORY_CARE_PROVIDER_SITE_OTHER): Payer: Self-pay | Admitting: Podiatry

## 2018-10-23 ENCOUNTER — Encounter (HOSPITAL_BASED_OUTPATIENT_CLINIC_OR_DEPARTMENT_OTHER): Payer: Self-pay | Admitting: Podiatry

## 2018-10-23 DIAGNOSIS — Z9889 Other specified postprocedural states: Secondary | ICD-10-CM

## 2018-10-23 NOTE — Progress Notes (Signed)
Called pt for follow up and he states he has been bleeding "real bad" States he has been dripping blood over night and that his dressing is saturated with blood. Instructed pt to call Dr. March Rummage office now and be seen for follow up and dressing change. Instructed to have someone take him to the office and not attempt to drive himself. Pt states it is not dripping now but was all night. Pt denies nausea, dizziness, SOB, or chest pain.

## 2018-10-26 NOTE — Progress Notes (Signed)
Subjective:  Patient ID: [name], male    DOB: 03-22-1961,  MRN: [id number]  No chief complaint on file.   DOS: 10/22/2018 Procedure:             1) gastrocnemius recession left             2) Endoscopic plantar fasciotomy left             3) Inferior heel spur resection left  58 y.o. male returns for post-op check.  States that he has had bleeding and pain in the left calf area presents for evaluation  Review of Systems: Negative except as noted in the HPI. Denies N/V/F/Ch.  Past Medical History:  Diagnosis Date  . Cardiomyopathy ([place]) 2009   per cardiac cath 12-10-2007 ef 35-40%;    last echo 02-01-2017n ef 55-60%  . COPD (chronic obstructive pulmonary disease) ([place])    previously seen pulmonologist-- dr Melvyn Novas--- GOLD 0-- hx multiple exacerbation's last one 06-09-2018 CAP with ARF (10-20-2018  per pt was followed by pcp and was clear;  pt was on symbicort bid and duoneb as needed per pt had not had it for past 3 months due to no insurance,  pt stated has productive smoker's cough but no sob or difficulty breathing)  . DDD (degenerative disc disease), cervical   . Heel spur, left   . History of nuclear stress test    epic 04-08-2015   low risk w/ small inferior wall scar of mild severity focus of reversible ischemia, ef 61%  . Hypertension    10-20-2018  per pt has not had medication , norvasc, in past 3 months due to no insurance  . Plantar fasciitis, left     Current Outpatient Medications:  .  amLODipine (NORVASC) 5 MG tablet, Take 1 tablet (5 mg total) by mouth daily for 30 days. (Patient not taking: Reported on 10/20/2018), Disp: 30 tablet, Rfl: 0 .  cephALEXin (KEFLEX) 500 MG capsule, Take 1 capsule (500 mg total) by mouth 2 (two) times a day for 7 days., Disp: 14 capsule, Rfl: 0 .  oxyCODONE-acetaminophen (PERCOCET) 10-325 MG tablet, Take 1 tablet by mouth every 4 (four) hours as needed for pain., Disp: 20 tablet, Rfl: 0  Social History   Tobacco Use   Smoking Status Current Every Day Smoker  . Packs/day: 1.00  . Years: 28.00  . Pack years: 28.00  . Types: Cigarettes  Smokeless Tobacco Never Used    No Known Allergies Objective:  There were no vitals filed for this visit. There is no height or weight on file to calculate BMI. Constitutional Well developed. Well nourished.  Vascular Foot warm and well perfused. Capillary refill normal to all digits.   Neurologic Normal speech. Oriented to person, place, and time. Epicritic sensation to light touch grossly present bilaterally except for sural nerve distribution  Dermatologic Skin healing well without signs of infection. Skin edges well coapted without signs of infection.  Orthopedic: Tenderness to palpation noted about the surgical site.   Radiographs: None Assessment:   1. Post-operative state    Plan:  Patient was evaluated and treated and all questions answered.  S/p foot surgery left -Progressing as expected post-operatively. -XR: None -WB Status: WBAT in CAM boot -Sutures: intact. -Medications: none refilled -Foot redressed. -Discussed if pain or swelling in the calf area worsens to present for evaluation or if he develops shortness of breath chest pain  Sural neuritis -Discussed sural neuritis as possible complication of surgery.  Discussed  hopeful improvement with time but he could always have some nerve symptoms in this distribution. Patient verbalized understanding and stated he was not bothered by it even if it was permanent.  No follow-ups on file.

## 2018-10-27 ENCOUNTER — Telehealth: Payer: Self-pay | Admitting: *Deleted

## 2018-10-27 MED ORDER — OXYCODONE-ACETAMINOPHEN 10-325 MG PO TABS: 1.0000 | ORAL_TABLET | ORAL | 0 refills | Status: DC | PRN
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
## 2018-10-30 ENCOUNTER — Ambulatory Visit (INDEPENDENT_AMBULATORY_CARE_PROVIDER_SITE_OTHER): Payer: Medicaid Other | Admitting: Podiatry

## 2018-10-30 ENCOUNTER — Other Ambulatory Visit: Payer: Self-pay

## 2018-10-30 VITALS — BP 150/102 | HR 77 | Temp 97.3°F

## 2018-10-30 DIAGNOSIS — R600 Localized edema: Secondary | ICD-10-CM

## 2018-10-30 DIAGNOSIS — M216X2 Other acquired deformities of left foot: Secondary | ICD-10-CM

## 2018-10-30 MED ORDER — OXYCODONE-ACETAMINOPHEN 10-325 MG PO TABS: 1.0000 | ORAL_TABLET | ORAL | 0 refills | Status: DC | PRN
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
## 2018-11-06 ENCOUNTER — Other Ambulatory Visit: Payer: Self-pay

## 2018-11-06 ENCOUNTER — Ambulatory Visit (INDEPENDENT_AMBULATORY_CARE_PROVIDER_SITE_OTHER): Payer: Medicaid Other | Admitting: Podiatry

## 2018-11-06 VITALS — Temp 97.2°F

## 2018-11-06 DIAGNOSIS — M7731 Calcaneal spur, right foot: Secondary | ICD-10-CM

## 2018-11-06 DIAGNOSIS — M722 Plantar fascial fibromatosis: Secondary | ICD-10-CM | POA: Diagnosis not present

## 2018-11-06 DIAGNOSIS — M9261 Juvenile osteochondrosis of tarsus, right ankle: Secondary | ICD-10-CM

## 2018-11-06 DIAGNOSIS — R600 Localized edema: Secondary | ICD-10-CM

## 2018-11-06 DIAGNOSIS — Z9889 Other specified postprocedural states: Secondary | ICD-10-CM

## 2018-11-06 MED ORDER — OXYCODONE-ACETAMINOPHEN 10-325 MG PO TABS: 1.0000 | ORAL_TABLET | ORAL | 0 refills | Status: DC | PRN

## 2018-11-06 NOTE — Progress Notes (Signed)
Subjective:  Patient ID: [name], male    DOB: 19-Jun-1960,  MRN: [id number]  Chief Complaint  Patient presents with  . Routine Post Op     DOS 10/22/2018  HEEL SPUR RESECTION, GASTROCNEMIUS RECESS, AND EPF LT " my foot hurts quite a bit but I have been up on it alot lately"    DOS: 10/22/2018 Procedure:             1) gastrocnemius recession left             2) Endoscopic plantar fasciotomy left             3) Inferior heel spur resection left  58 y.o. male returns for post-op check.  History as above.  Review of Systems: Negative except as noted in the HPI. Denies N/V/F/Ch.  Past Medical History:  Diagnosis Date  . Cardiomyopathy ([place]) 2009   per cardiac cath 12-10-2007 ef 35-40%;    last echo 02-01-2017n ef 55-60%  . COPD (chronic obstructive pulmonary disease) ([place])    previously seen pulmonologist-- dr Melvyn Novas--- GOLD 0-- hx multiple exacerbation's last one 06-09-2018 CAP with ARF (10-20-2018  per pt was followed by pcp and was clear;  pt was on symbicort bid and duoneb as needed per pt had not had it for past 3 months due to no insurance,  pt stated has productive smoker's cough but no sob or difficulty breathing)  . DDD (degenerative disc disease), cervical   . Heel spur, left   . History of nuclear stress test    epic 04-08-2015   low risk w/ small inferior wall scar of mild severity focus of reversible ischemia, ef 61%  . Hypertension    10-20-2018  per pt has not had medication , norvasc, in past 3 months due to no insurance  . Plantar fasciitis, left     Current Outpatient Medications:  .  albuterol (VENTOLIN HFA) 108 (90 Base) MCG/ACT inhaler, TWO PUFFS INHALED EVERY 4 TO 6 HOURS AS NEEDED FOR COUGH, WHEEZE, SHORTNESS OF BREATH, Disp: , Rfl:  .  amLODipine (NORVASC) 5 MG tablet, Take 1 tablet (5 mg total) by mouth daily for 30 days. (Patient not taking: Reported on 10/20/2018), Disp: 30 tablet, Rfl: 0 .  oxyCODONE-acetaminophen (PERCOCET) 10-325 MG tablet,  Take 1 tablet by mouth every 4 (four) hours as needed for pain., Disp: 20 tablet, Rfl: 0 .  SYMBICORT 160-4.5 MCG/ACT inhaler, TWO PUFFS INHALED TWICE A DAY, Disp: , Rfl:   Social History   Tobacco Use  Smoking Status Current Every Day Smoker  . Packs/day: 1.00  . Years: 28.00  . Pack years: 28.00  . Types: Cigarettes  Smokeless Tobacco Never Used    No Known Allergies Objective:   Vitals:   11/06/18 1314  Temp: (!) 97.2 F (36.2 C)   There is no height or weight on file to calculate BMI. Constitutional Well developed. Well nourished.  Vascular Foot warm and well perfused. Capillary refill normal to all digits.  Edema noted left lower extremity.  Neurologic Normal speech. Oriented to person, place, and time. Epicritic sensation to light touch grossly present bilaterally except for sural nerve distribution  Dermatologic Skin healing well without signs of infection. Skin edges well coapted without signs of infection.  Orthopedic: Tenderness to palpation noted about the surgical site.    Radiographs: None Assessment:   1. Plantar fasciitis   2. Heel spur, right   3. Post-operative state   4. Haglund's deformity, right  5. Localized edema    Plan:  Patient was evaluated and treated and all questions answered.  S/p foot surgery left -Progressing as expected post-operatively. -XR: None -WB Status: WBAT in CAM boot -Sutures: staples removed. -Medications: pain medication refilled -Foot redressed.  Unna boot reapplied given edema.  Return in about 2 weeks (around 11/20/2018) for Post-op.

## 2018-11-14 ENCOUNTER — Telehealth: Payer: Self-pay | Admitting: *Deleted

## 2018-11-14 ENCOUNTER — Telehealth (INDEPENDENT_AMBULATORY_CARE_PROVIDER_SITE_OTHER): Payer: Medicaid Other | Admitting: Podiatry

## 2018-11-14 MED ORDER — OXYCODONE-ACETAMINOPHEN 10-325 MG PO TABS: 1.0000 | ORAL_TABLET | ORAL | 0 refills | Status: DC | PRN

## 2018-11-14 NOTE — Telephone Encounter (Signed)
Pt called for pain medication refill.

## 2018-11-14 NOTE — Telephone Encounter (Signed)
Left message informing pt I would inform Dr. March Rummage of the pt's pain medication request.

## 2018-11-14 NOTE — Telephone Encounter (Signed)
Pt is requesting a refill of pain medication. States he only has two pills left.

## 2018-11-14 NOTE — Telephone Encounter (Signed)
Left message informing pt the pain medication had been sent to the [id number].

## 2018-11-18 ENCOUNTER — Telehealth: Payer: Self-pay

## 2018-11-18 MED ORDER — OXYCODONE-ACETAMINOPHEN 10-325 MG PO TABS: 1.0000 | ORAL_TABLET | ORAL | 0 refills | Status: DC | PRN

## 2018-11-18 NOTE — Telephone Encounter (Signed)
Pt did not answer phone call, left message on voicemail stating call from Dr. Eleanora Neighbor office at [facility] stating his prescription refill request had been approved and is ready at the pharmacy.

## 2018-11-18 NOTE — Addendum Note (Signed)
Addended by: Hardie Pulley on: 11/18/2018 10:38 AM   Modules accepted: Orders, Level of Service

## 2018-11-20 ENCOUNTER — Ambulatory Visit (INDEPENDENT_AMBULATORY_CARE_PROVIDER_SITE_OTHER): Payer: Medicaid Other | Admitting: Podiatry

## 2018-11-20 DIAGNOSIS — Z5329 Procedure and treatment not carried out because of patient's decision for other reasons: Secondary | ICD-10-CM

## 2018-11-20 NOTE — Progress Notes (Signed)
No show for post-op appointment. 

## 2018-11-26 ENCOUNTER — Telehealth: Payer: Self-pay | Admitting: *Deleted

## 2018-11-26 ENCOUNTER — Other Ambulatory Visit: Payer: Self-pay | Admitting: Podiatry

## 2018-11-26 MED ORDER — OXYCODONE-ACETAMINOPHEN 10-325 MG PO TABS: 1.0000 | ORAL_TABLET | ORAL | 0 refills | Status: DC | PRN

## 2018-11-26 NOTE — Telephone Encounter (Signed)
Pt called states he took his last pain pill at 12:00pm today and would like a refill.

## 2018-11-26 NOTE — Telephone Encounter (Signed)
Dr. Amalia Hailey states he will refill the oxycodone for pt.

## 2018-11-26 NOTE — Progress Notes (Signed)
.  postop

## 2018-11-26 NOTE — Telephone Encounter (Signed)
I called pt and asked for him to describe his pain. Pt states he is having numbness and the pain is not in the calf but in the boot at the incision sites. I told pt to loosen the boot to see if helped with the numbness and call the pharmacy to check for his refill.

## 2018-11-27 ENCOUNTER — Telehealth: Payer: Self-pay | Admitting: *Deleted

## 2018-11-27 ENCOUNTER — Ambulatory Visit (INDEPENDENT_AMBULATORY_CARE_PROVIDER_SITE_OTHER): Payer: Self-pay | Admitting: Podiatry

## 2018-11-27 ENCOUNTER — Other Ambulatory Visit: Payer: Self-pay

## 2018-11-27 DIAGNOSIS — M722 Plantar fascial fibromatosis: Secondary | ICD-10-CM

## 2018-11-27 DIAGNOSIS — Z9889 Other specified postprocedural states: Secondary | ICD-10-CM

## 2018-11-27 DIAGNOSIS — M7731 Calcaneal spur, right foot: Secondary | ICD-10-CM

## 2018-11-27 NOTE — Telephone Encounter (Signed)
Dr. March Rummage ordered S/P PT within 90 day global period left foot heel spur resection, gastrocnemius recess and EPF, weight bearing as tolerated in surgical foot gear. Orders delivered to [facility] - In-office.

## 2018-11-28 ENCOUNTER — Other Ambulatory Visit: Payer: Self-pay | Admitting: Podiatry

## 2018-11-28 ENCOUNTER — Telehealth: Payer: Self-pay | Admitting: *Deleted

## 2018-11-28 DIAGNOSIS — M7731 Calcaneal spur, right foot: Secondary | ICD-10-CM

## 2018-11-28 DIAGNOSIS — Z9889 Other specified postprocedural states: Secondary | ICD-10-CM

## 2018-11-28 NOTE — Telephone Encounter (Signed)
[facility] - [name] states they do not accept Medicaid even within the 90 days global period.

## 2018-11-28 NOTE — Telephone Encounter (Signed)
Referral faxed to [facility]. 

## 2018-12-03 ENCOUNTER — Telehealth: Payer: Self-pay | Admitting: *Deleted

## 2018-12-03 ENCOUNTER — Other Ambulatory Visit: Payer: Self-pay

## 2018-12-03 ENCOUNTER — Other Ambulatory Visit: Payer: Self-pay | Admitting: Podiatry

## 2018-12-03 ENCOUNTER — Ambulatory Visit: Payer: Medicaid Other | Attending: Podiatry | Admitting: Physical Therapy

## 2018-12-03 ENCOUNTER — Encounter: Payer: Self-pay | Admitting: Physical Therapy

## 2018-12-03 DIAGNOSIS — R2689 Other abnormalities of gait and mobility: Secondary | ICD-10-CM | POA: Diagnosis present

## 2018-12-03 DIAGNOSIS — M25572 Pain in left ankle and joints of left foot: Secondary | ICD-10-CM

## 2018-12-03 DIAGNOSIS — M25672 Stiffness of left ankle, not elsewhere classified: Secondary | ICD-10-CM

## 2018-12-03 MED ORDER — OXYCODONE-ACETAMINOPHEN 10-325 MG PO TABS: 1.0000 | ORAL_TABLET | ORAL | 0 refills | Status: DC | PRN

## 2018-12-03 NOTE — Telephone Encounter (Signed)
Dr. March Rummage states in [place], he sent pt's pain medication and requested I inform pt. I informed pt and pt states he is dreading PT. I told pt that he would have an entrance interview at PT and he should tell them what his has been doing and what works and doesn't work for him, PT is also to get him back on both feet and also pain management. Pt states understanding.

## 2018-12-03 NOTE — Telephone Encounter (Signed)
Pt states he will have rehab today at 2:00pm and only has two pain pills and would like a refill, if needs pain medication after.

## 2018-12-03 NOTE — Progress Notes (Signed)
Rx sent to pharmacy   

## 2018-12-04 ENCOUNTER — Encounter: Payer: Self-pay | Admitting: Physical Therapy

## 2018-12-04 NOTE — Therapy (Signed)
[place], [place], [address] Phone: [phone]   Fax:  [phone]  Physical Therapy Evaluation  Patient Details  Name: [name] MRN: [id number] Date of Birth: [birth year] Referring Provider (PT): Dr Hardie Pulley    Encounter Date: 12/03/2018  PT End of Session - 12/04/18 0800    Visit Number  1    Number of Visits  4    Date for PT Re-Evaluation  01/01/19    Authorization Type  Medicaid eval    PT Start Time  1417    PT Stop Time  1458    PT Time Calculation (min)  41 min    Activity Tolerance  Patient tolerated treatment well    Behavior During Therapy  [facility] for tasks assessed/performed       Past Medical History:  Diagnosis Date  . Cardiomyopathy ([place]) 2009   per cardiac cath 12-10-2007 ef 35-40%;    last echo 02-01-2017n ef 55-60%  . COPD (chronic obstructive pulmonary disease) ([place])    previously seen pulmonologist-- dr Melvyn Novas--- GOLD 0-- hx multiple exacerbation's last one 06-09-2018 CAP with ARF (10-20-2018  per pt was followed by pcp and was clear;  pt was on symbicort bid and duoneb as needed per pt had not had it for past 3 months due to no insurance,  pt stated has productive smoker's cough but no sob or difficulty breathing)  . DDD (degenerative disc disease), cervical   . Heel spur, left   . History of nuclear stress test    epic 04-08-2015   low risk w/ small inferior wall scar of mild severity focus of reversible ischemia, ef 61%  . Hypertension    10-20-2018  per pt has not had medication , norvasc, in past 3 months due to no insurance  . Plantar fasciitis, left     Past Surgical History:  Procedure Laterality Date  . ACHILLES TENDON REPAIR Left 2015  . BICEPS TENDON REPAIR Right 2018  . CARDIAC CATHETERIZATION  12-10-2007  dr al little   no evidence cad, mild to moderate LVD, ef 35-40%  . GASTROC RECESSION EXTREMITY Left 10/22/2018   Procedure: GASTROC RECESSION  EXTREMITY;  Surgeon: [name], DPM;  Location[facility];  Service: Podiatry;  Laterality: Left;  . HEEL SPUR RESECTION Left 10/22/2018   Procedure: HEEL SPUR RESECTION;  Surgeon: [name], DPM;  Location: [place];  Service: Podiatry;  Laterality: Left;  . HIP SURGERY Left 2005 approx.  Marland Kitchen KNEE SURGERY Bilateral x7   last one 2010  approx.  Marland Kitchen PLANTAR FASCIA RELEASE Left 10/22/2018   Procedure: ENDOSCOPIC PLANTAR FASCIOTOMY;  Surgeon: [name], DPM;  Location: [place];  Service: Podiatry;  Laterality: Left;    There were no vitals filed for this visit.   Subjective Assessment - 12/03/18 1424    Subjective  Patient had a bone spur removal and an achillies lengthening on 10/22/2018. Since that point the patient has had significant pain and numbness in the left side of his foot and into his toes. He was put in a surgical shoe last week.    How long can you stand comfortably?  < 5 min before pain starts    How long can you walk comfortably?  difficulty walking    Diagnostic tests  Nothing    Patient Stated Goals  to have less pain    Currently in Pain?  Yes  Pain Score  7     Pain Location  Foot    Pain Orientation  Left    Pain Descriptors / Indicators  Dull    Pain Type  Chronic pain    Pain Onset  More than a month ago    Pain Frequency  Constant    Aggravating Factors   Standing and walking makes the pain worse    Pain Relieving Factors  rest    Multiple Pain Sites  No         [facility] PT Assessment - 12/04/18 0001      Assessment   Medical Diagnosis  Left bone spur removal and achillies lengthening     Referring Provider (PT)  Dr Hardie Pulley     Onset Date/Surgical Date  10/22/18    Hand Dominance  Right    Next MD Visit  Next week     Prior Therapy  No therapy       Precautions   Precautions  None      Restrictions   Weight Bearing Restrictions  No      Balance Screen   Has the patient  fallen in the past 6 months  No    Has the patient had a decrease in activity level because of a fear of falling?   No    Is the patient reluctant to leave their home because of a fear of falling?   No      Home Film/video editor residence    Additional Comments  No steps into the patients house       Prior Function   Level of Independence  Independent    Vocation  Unemployed    Leisure  play golf       Cognition   Overall Cognitive Status  Within Functional Limits for tasks assessed    Attention  Focused      Observation/Other Assessments   Observations  large areo of scartissue in the lateral head of the gastroc     Skin Integrity  hyper-sensativity to gastroc scar     Focus on Therapeutic Outcomes (FOTO)   Mediciad      Sensation   Additional Comments  significant numbness into the lateral calf and into the foot      Coordination   Gross Motor Movements are Fluid and Coordinated  Yes    Fine Motor Movements are Fluid and Coordinated  Yes      Posture/Postural Control   Posture Comments  rounded shoulders; bilateral toe out       AROM   AROM Assessment Site  Ankle    Right/Left Ankle  Left    Left Ankle Dorsiflexion  -3    Left Ankle Plantar Flexion  26    Left Ankle Inversion  24    Left Ankle Eversion  12      PROM   Overall PROM   Deficits    PROM Assessment Site  Ankle    Right/Left Ankle  Left    Left Ankle Dorsiflexion  0    Left Ankle Plantar Flexion  30    Left Ankle Inversion  26    Left Ankle Eversion  13      Strength   Strength Assessment Site  Ankle;Hip    Right/Left Hip  Right;Left    Right Hip Flexion  4+/5    Left Hip Flexion  4/5    Right/Left Ankle  Right;Left  Left Ankle Dorsiflexion  4/5    Left Ankle Plantar Flexion  3/5    Left Ankle Inversion  4/5    Left Ankle Eversion  4/5      Palpation   Palpation comment  tenderness to plapation and increased radicualr signs with palpation of scar tissue in lateral  gastroc       Ambulation/Gait   Gait Comments  decreased single leg stance time on the right; decreased hip flexion on the right                 Objective measurements completed on examination: See above findings.      [facility] Adult PT Treatment/Exercise - 12/04/18 0001      Exercises   Exercises  Ankle      Manual Therapy   Manual Therapy  Soft tissue mobilization    Soft tissue mobilization  gentle scar tissue mobilization starting at the outer edges of the scar tissue. Not moved towards the middle at this time until we see how the patient respods. No increase in symptoms today. Patient adivsed to follow similar pattern to therapist. Patient educated how to do self mobilization.       Ankle Exercises: Stretches   Other Stretch  light gastroc stretch with a towell       Ankle Exercises: Supine   Other Supine Ankle Exercises  ankle 4 way ankle mobility              PT Education - 12/04/18 0800    Education Details  HEP, symptom mangement    Person(s) Educated  Patient    Methods  Explanation;Demonstration;Tactile cues;Verbal cues    Comprehension  Verbalized understanding;Returned demonstration;Verbal cues required;Tactile cues required       PT Short Term Goals - 12/04/18 0836      PT SHORT TERM GOAL #1   Title  Patient will increase passive DF to 7 degrees    Baseline  passive to neutral    Time  4    Period  Weeks    Status  New    Target Date  01/01/19      PT SHORT TERM GOAL #2   Title  Patient wil increase gross L ankle strength to 4+/5    Baseline  PF 3/5 all other ankle motions 4/5    Time  4    Period  Weeks    Status  New    Target Date  01/01/19      PT SHORT TERM GOAL #3   Title  Patient will report a decrease in numbness and radicular pain laterally on the left by 25%    Baseline  numb and painful allthe time    Time  4    Period  Weeks    Status  New    Target Date  01/01/19                Plan - 12/03/18 1504     Clinical Impression Statement  Patient is a 58 year old male S/P bone spur removal and gastroc lengthening on the left. The patient's C/O is numbness in his lateral calf extending into his lateral foot. He has decreased strength and range of motion in his ankle. His DF is limited but is able to reach neutral with light passive ROM. He has an area of what seems to be scar tissue at the bottom of his lateral head of the gatroc. When pressed hie has increased pain into the  lateral cald and into his lateral two toes. He would benefit from skilled therapy to improve.    Personal Factors and Comorbidities  Comorbidity 1;Comorbidity 2;Comorbidity 3+    Comorbidities  right quad tendon rupture, right knee pain. left plantar facia release, left hip surgery    Examination-Activity Limitations  Squat;Stairs;Locomotion Level    Examination-Participation Restrictions  Community Activity;Laundry;Meal Prep;Shop    Stability/Clinical Decision Making  Evolving/Moderate complexity   numbness when he stands   Clinical Decision Making  Moderate    Rehab Potential  Good    PT Frequency  --   2W1 1W1 per Medicaid guidlines   PT Duration  3 weeks    PT Treatment/Interventions  ADLs/Self Care Home Management;Cryotherapy;Electrical Stimulation;Iontophoresis 4mg /ml Dexamethasone;Moist Heat;Ultrasound;DME Instruction;Stair training;Gait training;Functional mobility training;Therapeutic activities;Therapeutic exercise;Neuromuscular re-education;Patient/family education;Manual techniques;Passive range of motion;Dry needling;Taping    PT Next Visit Plan  begin light scar tissue mobilization. Be careful not to increase radicular symptoms. add light resistanceto ankle strengthening. Begin standing weight shifts. Work on increasing DF    PT Home Exercise Plan  ankle 4 way AROM light gastroc strtch ,, self scar tissue mobilization: patient strongly advised and educated not to increase his symptoms    Consulted and Agree with Plan of  Care  Patient       Patient will benefit from skilled therapeutic intervention in order to improve the following deficits and impairments:  Abnormal gait, Decreased activity tolerance, Decreased strength, Obesity, Decreased endurance, Decreased mobility, Decreased balance  Visit Diagnosis: Pain in left ankle and joints of left foot  Other abnormalities of gait and mobility  Stiffness of left ankle, not elsewhere classified     Problem List Patient Active Problem List   Diagnosis Date Noted  . Plantar fasciitis   . Heel spur, left   . Acquired equinus deformity of left foot   . Acute respiratory failure with hypoxia ([place]) 06/10/2018  . COPD with acute exacerbation ([place]) 06/10/2018  . Community acquired bilateral lower lobe pneumonia ([place]) 06/10/2018  . Achilles tendinitis, right leg 04/24/2018  . Acute respiratory distress 12/20/2017  . History of noncompliance with medical treatment 12/20/2017  . Acute sinusitis, unspecified 03/15/2017  . Rupture of right distal biceps tendon 03/01/2016  . Pain in right lower leg 01/30/2016  . Chest pain 04/26/2015  . Hypokalemia 04/26/2015  . COPD GOLD 0 / active smoker 04/26/2015  . Pain in the chest   . Fall 07/22/2014  . Multiple fractures of ribs of right side 07/22/2014  . Pneumothorax on right 07/22/2014  . Traumatic pneumohemothorax 07/21/2014  . Altered mental state 04/15/2013  . Acute encephalopathy 04/15/2013  . Difficulty speaking 04/15/2013  . Headache(784.0) 04/15/2013  . Cigarette smoker 04/15/2013  . HTN (hypertension) 04/15/2013    Carney Living PT DPT  12/04/2018, 10:13 AM  [facility] [address] [place], [place], [address] Phone: [phone]   Fax:  [phone]  Name: [name] MRN: [id number] Date of Birth: 03/18/1961

## 2018-12-04 NOTE — Patient Instructions (Signed)
Patient is already doing 4 way motion and light stretching. Patient advised to continue this at home.

## 2018-12-11 ENCOUNTER — Ambulatory Visit (INDEPENDENT_AMBULATORY_CARE_PROVIDER_SITE_OTHER): Payer: Self-pay | Admitting: Podiatry

## 2018-12-11 ENCOUNTER — Other Ambulatory Visit: Payer: Self-pay

## 2018-12-11 ENCOUNTER — Telehealth: Payer: Self-pay | Admitting: *Deleted

## 2018-12-11 VITALS — Temp 98.7°F

## 2018-12-11 DIAGNOSIS — Z9889 Other specified postprocedural states: Secondary | ICD-10-CM

## 2018-12-11 MED ORDER — METHYLPREDNISOLONE 4 MG PO TBPK: ORAL_TABLET | ORAL | 0 refills | Status: DC

## 2018-12-11 MED ORDER — OXYCODONE-ACETAMINOPHEN 10-325 MG PO TABS: 1.0000 | ORAL_TABLET | ORAL | 0 refills | Status: DC | PRN

## 2018-12-11 NOTE — Progress Notes (Signed)
Subjective:  Patient ID: [name], male    DOB: 06-Dec-1960,  MRN: [id number]  Chief Complaint  Patient presents with   Routine Post Op    pov#3 DOS 7.29.2020 Heel Spur Resection, Gastrocnemius Recess, EPF Left. Pt states very sore, still numb on left 5th digit and back of calf. Denies fever/nausea/vomiting/chills.   DOS: 10/22/2018 Procedure:             1) gastrocnemius recession left             2) Endoscopic plantar fasciotomy left             3) Inferior heel spur resection left  58 y.o. male returns for post-op check.  History as above. Sensation improving to the 4th toe area.  Review of Systems: Negative except as noted in the HPI. Denies N/V/F/Ch.  Past Medical History:  Diagnosis Date   Cardiomyopathy ([place]) 2009   per cardiac cath 12-10-2007 ef 35-40%;    last echo 02-01-2017n ef 55-60%   COPD (chronic obstructive pulmonary disease) ([place])    previously seen pulmonologist-- dr Melvyn Novas--- GOLD 0-- hx multiple exacerbation's last one 06-09-2018 CAP with ARF (10-20-2018  per pt was followed by pcp and was clear;  pt was on symbicort bid and duoneb as needed per pt had not had it for past 3 months due to no insurance,  pt stated has productive smoker's cough but no sob or difficulty breathing)   DDD (degenerative disc disease), cervical    Heel spur, left    History of nuclear stress test    epic 04-08-2015   low risk w/ small inferior wall scar of mild severity focus of reversible ischemia, ef 61%   Hypertension    10-20-2018  per pt has not had medication , norvasc, in past 3 months due to no insurance   Plantar fasciitis, left     Current Outpatient Medications:    albuterol (VENTOLIN HFA) 108 (90 Base) MCG/ACT inhaler, TWO PUFFS INHALED EVERY 4 TO 6 HOURS AS NEEDED FOR COUGH, WHEEZE, SHORTNESS OF BREATH, Disp: , Rfl:    oxyCODONE-acetaminophen (PERCOCET) 10-325 MG tablet, Take 1 tablet by mouth every 4 (four) hours as needed for pain., Disp: 20 tablet,  Rfl: 0   sildenafil (REVATIO) 20 MG tablet, TAKE 1 TO 2 TABLETS BY MOUTH ONE HOUR PRIOR TO SEXUAL ACTIVITY, Disp: , Rfl:    SYMBICORT 160-4.5 MCG/ACT inhaler, TWO PUFFS INHALED TWICE A DAY, Disp: , Rfl:    amLODipine (NORVASC) 5 MG tablet, Take 1 tablet (5 mg total) by mouth daily for 30 days. (Patient not taking: Reported on 10/20/2018), Disp: 30 tablet, Rfl: 0   methylPREDNISolone (MEDROL DOSEPAK) 4 MG TBPK tablet, 6 Day Taper Pack. Take as Directed., Disp: 21 tablet, Rfl: 0  Social History   Tobacco Use  Smoking Status Current Every Day Smoker   Packs/day: 1.00   Years: 28.00   Pack years: 28.00   Types: Cigarettes  Smokeless Tobacco Never Used    No Known Allergies Objective:   Vitals:   12/11/18 1603  Temp: 98.7 F (37.1 C)   There is no height or weight on file to calculate BMI. Constitutional Well developed. Well nourished.  Vascular Foot warm and well perfused. Capillary refill normal to all digits.  Edema noted left lower extremity.  Neurologic Normal speech. Oriented to person, place, and time. Epicritic sensation to light touch grossly present bilaterally except for sural nerve distribution. Sensation returning to 4th toe. Tinel's sign at  the calf shooting to the toes.  Dermatologic Skin well healed.  Orthopedic: Tenderness to palpation noted about the surgical site.    Radiographs: None Assessment:   1. Post-operative state    Plan:  Patient was evaluated and treated and all questions answered.  S/p foot surgery left -Rx Medrol pack -Refill pain medication -Continue PT. -Discussed importance of rest and icing -Continue surgical shoe transition to sneaker as tolerated.  Sural Neuritis -Would consider diagnostic/therapeutic injection at some point for sural neuritis, possible nerve entrapment. Sensation improving, likely nerve is scarred down. Continue PT for scar tissue massage.  Return in about 3 weeks (around 01/01/2019).

## 2018-12-11 NOTE — Progress Notes (Signed)
Subjective:  Patient ID: [name], male    DOB: 1960-04-08,  MRN: [id number]  Chief Complaint  Patient presents with  . Routine Post Op    Pt states soreness in left heel, denies fever/nausea/vomiting/chills and states no drainage. Pt states left 4th and 5th toes are numb since surgery.   DOS: 10/22/2018 Procedure:             1) gastrocnemius recession left             2) Endoscopic plantar fasciotomy left             3) Inferior heel spur resection left  58 y.o. male returns for post-op check.  History as above.  Review of Systems: Negative except as noted in the HPI. Denies N/V/F/Ch.  Past Medical History:  Diagnosis Date  . Cardiomyopathy ([place]) 2009   per cardiac cath 12-10-2007 ef 35-40%;    last echo 02-01-2017n ef 55-60%  . COPD (chronic obstructive pulmonary disease) ([place])    previously seen pulmonologist-- dr Melvyn Novas--- GOLD 0-- hx multiple exacerbation's last one 06-09-2018 CAP with ARF (10-20-2018  per pt was followed by pcp and was clear;  pt was on symbicort bid and duoneb as needed per pt had not had it for past 3 months due to no insurance,  pt stated has productive smoker's cough but no sob or difficulty breathing)  . DDD (degenerative disc disease), cervical   . Heel spur, left   . History of nuclear stress test    epic 04-08-2015   low risk w/ small inferior wall scar of mild severity focus of reversible ischemia, ef 61%  . Hypertension    10-20-2018  per pt has not had medication , norvasc, in past 3 months due to no insurance  . Plantar fasciitis, left     Current Outpatient Medications:  .  albuterol (VENTOLIN HFA) 108 (90 Base) MCG/ACT inhaler, TWO PUFFS INHALED EVERY 4 TO 6 HOURS AS NEEDED FOR COUGH, WHEEZE, SHORTNESS OF BREATH, Disp: , Rfl:  .  sildenafil (REVATIO) 20 MG tablet, TAKE 1 TO 2 TABLETS BY MOUTH ONE HOUR PRIOR TO SEXUAL ACTIVITY, Disp: , Rfl:  .  SYMBICORT 160-4.5 MCG/ACT inhaler, TWO PUFFS INHALED TWICE A DAY, Disp: , Rfl:  .   amLODipine (NORVASC) 5 MG tablet, Take 1 tablet (5 mg total) by mouth daily for 30 days. (Patient not taking: Reported on 10/20/2018), Disp: 30 tablet, Rfl: 0 .  oxyCODONE-acetaminophen (PERCOCET) 10-325 MG tablet, Take 1 tablet by mouth every 4 (four) hours as needed for pain., Disp: 20 tablet, Rfl: 0  Social History   Tobacco Use  Smoking Status Current Every Day Smoker  . Packs/day: 1.00  . Years: 28.00  . Pack years: 28.00  . Types: Cigarettes  Smokeless Tobacco Never Used    No Known Allergies Objective:   There were no vitals filed for this visit. There is no height or weight on file to calculate BMI. Constitutional Well developed. Well nourished.  Vascular Foot warm and well perfused. Capillary refill normal to all digits.  Edema noted left lower extremity.  Neurologic Normal speech. Oriented to person, place, and time. Epicritic sensation to light touch grossly present bilaterally except for sural nerve distribution  Dermatologic Skin well healed.  Orthopedic: Tenderness to palpation noted about the surgical site.    Radiographs: None Assessment:   1. Post-operative state    Plan:  Patient was evaluated and treated and all questions answered.  S/p foot surgery  left -Still having pain, soreness, numbness. -Advised to continue to rest the area. He is doing a lot of activity on it. -Transition out of boot as tolerated  Sural Neuritis -Would consider diagnostic/therapeutic injection at some point for sural neuritis, possible nerve entrapment.  Return in about 2 weeks (around 12/11/2018) for Post-op, Left.

## 2018-12-11 NOTE — Telephone Encounter (Signed)
Pt would like the pain medication refilled.

## 2018-12-16 ENCOUNTER — Ambulatory Visit: Payer: Medicaid Other | Admitting: Physical Therapy

## 2018-12-16 ENCOUNTER — Encounter: Payer: Self-pay | Admitting: Physical Therapy

## 2018-12-16 ENCOUNTER — Other Ambulatory Visit: Payer: Self-pay

## 2018-12-16 DIAGNOSIS — R2689 Other abnormalities of gait and mobility: Secondary | ICD-10-CM

## 2018-12-16 DIAGNOSIS — M25572 Pain in left ankle and joints of left foot: Secondary | ICD-10-CM

## 2018-12-16 DIAGNOSIS — M25672 Stiffness of left ankle, not elsewhere classified: Secondary | ICD-10-CM

## 2018-12-16 NOTE — Therapy (Signed)
[place], [place], [address] Phone: [phone]   Fax:  [phone]  Physical Therapy Treatment  Patient Details  Name: [name] MRN: [id number] Date of Birth: December 15, 1960 Referring Provider (PT): Dr Hardie Pulley    Encounter Date: 12/16/2018  PT End of Session - 12/16/18 1047    Visit Number  2    Number of Visits  4    Date for PT Re-Evaluation  01/01/19    Authorization Type  Medicaid eval    PT Start Time  0932    PT Stop Time  1011    PT Time Calculation (min)  39 min    Activity Tolerance  Patient tolerated treatment well    Behavior During Therapy  [facility] for tasks assessed/performed       Past Medical History:  Diagnosis Date  . Cardiomyopathy ([place]) 2009   per cardiac cath 12-10-2007 ef 35-40%;    last echo 02-01-2017n ef 55-60%  . COPD (chronic obstructive pulmonary disease) ([place])    previously seen pulmonologist-- dr Melvyn Novas--- GOLD 0-- hx multiple exacerbation's last one 06-09-2018 CAP with ARF (10-20-2018  per pt was followed by pcp and was clear;  pt was on symbicort bid and duoneb as needed per pt had not had it for past 3 months due to no insurance,  pt stated has productive smoker's cough but no sob or difficulty breathing)  . DDD (degenerative disc disease), cervical   . Heel spur, left   . History of nuclear stress test    epic 04-08-2015   low risk w/ small inferior wall scar of mild severity focus of reversible ischemia, ef 61%  . Hypertension    10-20-2018  per pt has not had medication , norvasc, in past 3 months due to no insurance  . Plantar fasciitis, left     Past Surgical History:  Procedure Laterality Date  . ACHILLES TENDON REPAIR Left 2015  . BICEPS TENDON REPAIR Right 2018  . CARDIAC CATHETERIZATION  12-10-2007  dr al little   no evidence cad, mild to moderate LVD, ef 35-40%  . GASTROC RECESSION EXTREMITY Left 10/22/2018   Procedure: GASTROC RECESSION  EXTREMITY;  Surgeon: [name], DPM;  Location: [facility];  Service: Podiatry;  Laterality: Left;  . HEEL SPUR RESECTION Left 10/22/2018   Procedure: HEEL SPUR RESECTION;  Surgeon: [name], DPM;  Location: [place];  Service: Podiatry;  Laterality: Left;  . HIP SURGERY Left 2005 approx.  Marland Kitchen KNEE SURGERY Bilateral x7   last one 2010  approx.  Marland Kitchen PLANTAR FASCIA RELEASE Left 10/22/2018   Procedure: ENDOSCOPIC PLANTAR FASCIOTOMY;  Surgeon: [name], DPM;  Location: [place];  Service: Podiatry;  Laterality: Left;    There were no vitals filed for this visit.  Subjective Assessment - 12/16/18 1041    Subjective  Patient reports he is getting some feeling back in his 4th toe. He has been working on his scar massage. His knee is hurting him more. He has been put on a dose pack of steroids.    How long can you stand comfortably?  < 5 min before pain starts    How long can you walk comfortably?  difficulty walking    Diagnostic tests  Nothing    Patient Stated Goals  to have less pain    Currently in Pain?  Yes    Pain Score  6  Pain Location  Foot    Pain Orientation  Left    Pain Descriptors / Indicators  Dull;Radiating    Pain Type  Chronic pain    Pain Onset  More than a month ago    Pain Frequency  Constant    Aggravating Factors   standing and walking    Pain Relieving Factors  rest    Effect of Pain on Daily Activities  difficulty perfroming ADL's    Multiple Pain Sites  No                       [facility] Adult PT Treatment/Exercise - 12/16/18 0001      Manual Therapy   Manual therapy comments  trigger point release to gastroc     Soft tissue mobilization  scar tissue mobilization working from the outside in. Only mild iincrease in radicular symptoms       Ankle Exercises: Stretches   Other Stretch  light gastroc stretch with a towell       Ankle Exercises: Supine   Other Supine Ankle  Exercises  quad set 2x10 left     Other Supine Ankle Exercises  ankle 4 way ankle mobility green x20 each way       Ankle Exercises: Aerobic   Nustep  5 min with cuing for pace              PT Education - 12/16/18 1047    Education Details  reviewed scar tissue release    Person(s) Educated  Patient    Methods  Explanation;Demonstration;Tactile cues;Verbal cues    Comprehension  Verbalized understanding;Returned demonstration;Verbal cues required;Tactile cues required       PT Short Term Goals - 12/16/18 1051      PT SHORT TERM GOAL #1   Title  Patient will increase passive DF to 7 degrees    Baseline  passive to neutral    Time  4    Period  Weeks    Status  On-going    Target Date  01/01/19      PT SHORT TERM GOAL #2   Title  Patient wil increase gross L ankle strength to 4+/5    Baseline  PF 3/5 all other ankle motions 4/5    Time  4    Period  Weeks    Status  On-going    Target Date  01/01/19      PT SHORT TERM GOAL #3   Title  Patient will report a decrease in numbness and radicular pain laterally on the left by 25%    Baseline  numb and painful allthe time    Time  4    Period  Weeks    Status  On-going    Target Date  01/01/19               Plan - 12/16/18 1048    Clinical Impression Statement  Patien reported minor pain and radicular symptoms with scar tissue mobilization. Per palpation the scar tissue appears inmproved form last visit. He is having more pain in his knee today. Therapy gave him a quad set to do for his knee. Therapy also gave him focused ankle strengthneing with a band.    Comorbidities  right quad tendon rupture, right knee pain. left plantar facia release, left hip surgery    Examination-Participation Restrictions  Community Activity;Laundry;Meal Prep;Shop    Stability/Clinical Decision Making  Evolving/Moderate complexity    Clinical Decision Making  Moderate    PT Duration  3 weeks    PT Treatment/Interventions  ADLs/Self  Care Home Management;Cryotherapy;Electrical Stimulation;Iontophoresis 4mg /ml Dexamethasone;Moist Heat;Ultrasound;DME Instruction;Stair training;Gait training;Functional mobility training;Therapeutic activities;Therapeutic exercise;Neuromuscular re-education;Patient/family education;Manual techniques;Passive range of motion;Dry needling;Taping    PT Next Visit Plan  assess tolerance to scar tissue mobilization. Add in light standing exercises if knee tolerates. If not add in baps board or seated heel raise and windshield    PT Home Exercise Plan  ankle 4 way AROM light gastroc strtch ,, self scar tissue mobilization: patient strongly advised and educated not to increase his symptoms    Consulted and Agree with Plan of Care  Patient       Patient will benefit from skilled therapeutic intervention in order to improve the following deficits and impairments:  Abnormal gait, Decreased activity tolerance, Decreased strength, Obesity, Decreased endurance, Decreased mobility, Decreased balance  Visit Diagnosis: Pain in left ankle and joints of left foot  Other abnormalities of gait and mobility  Stiffness of left ankle, not elsewhere classified     Problem List Patient Active Problem List   Diagnosis Date Noted  . Plantar fasciitis   . Heel spur, left   . Acquired equinus deformity of left foot   . Acute respiratory failure with hypoxia ([place]) 06/10/2018  . COPD with acute exacerbation ([place]) 06/10/2018  . Community acquired bilateral lower lobe pneumonia ([place]) 06/10/2018  . Achilles tendinitis, right leg 04/24/2018  . Acute respiratory distress 12/20/2017  . History of noncompliance with medical treatment 12/20/2017  . Acute sinusitis, unspecified 03/15/2017  . Rupture of right distal biceps tendon 03/01/2016  . Pain in right lower leg 01/30/2016  . Chest pain 04/26/2015  . Hypokalemia 04/26/2015  . COPD GOLD 0 / active smoker 04/26/2015  . Pain in the chest   . Fall 07/22/2014  .  Multiple fractures of ribs of right side 07/22/2014  . Pneumothorax on right 07/22/2014  . Traumatic pneumohemothorax 07/21/2014  . Altered mental state 04/15/2013  . Acute encephalopathy 04/15/2013  . Difficulty speaking 04/15/2013  . Headache(784.0) 04/15/2013  . Cigarette smoker 04/15/2013  . HTN (hypertension) 04/15/2013    Carney Living PT DPT  12/16/2018, 2:03 PM  [facility] [address] [place], [place], [address] Phone: [phone]   Fax:  [phone]  Name: [name] MRN: [id number] Date of Birth: [birth year]

## 2018-12-19 ENCOUNTER — Telehealth: Payer: Self-pay | Admitting: *Deleted

## 2018-12-19 MED ORDER — OXYCODONE-ACETAMINOPHEN 10-325 MG PO TABS: 1.0000 | ORAL_TABLET | ORAL | 0 refills | Status: DC | PRN

## 2018-12-19 NOTE — Telephone Encounter (Signed)
Pt requesting pain medication.  

## 2018-12-19 NOTE — Telephone Encounter (Signed)
Pt states he is still having pain, but can now feel the toe next to his little toe, but therapy is giving him a fit. I told pt I would inform Dr. March Rummage to check his pharmacy and follow the instructions for his medication.

## 2018-12-19 NOTE — Telephone Encounter (Signed)
Refilled please inform 

## 2018-12-29 ENCOUNTER — Other Ambulatory Visit: Payer: Self-pay | Admitting: Podiatry

## 2018-12-29 ENCOUNTER — Telehealth: Payer: Self-pay | Admitting: *Deleted

## 2018-12-29 MED ORDER — GABAPENTIN 300 MG PO CAPS: 300.0000 mg | ORAL_CAPSULE | Freq: Every day | ORAL | 0 refills | Status: DC

## 2018-12-29 MED ORDER — OXYCODONE-ACETAMINOPHEN 5-325 MG PO TABS: 1.0000 | ORAL_TABLET | ORAL | 0 refills | Status: DC | PRN

## 2018-12-29 NOTE — Telephone Encounter (Signed)
Left message informing pt of Dr. March Rummage orders for percocet and gabapentin, to follow the new prescription instructions.

## 2018-12-29 NOTE — Progress Notes (Signed)
Refill pain medication. Lower dose of percocet, add on gabapentin 300 qHS for nerve pain.

## 2018-12-29 NOTE — Telephone Encounter (Signed)
I called pt and he states he is getting a lot of sharp pain through his foot, with some numbness to the side, but he is not sleeping and he feels depressed, and needs something done. Pt is scheduled 01/01/2019 for follow up, and Dr. March Rummage had said something about a shot.

## 2018-12-29 NOTE — Telephone Encounter (Signed)
Pt called states he has a new phone number [phone], and he didn't know what he was going to do if he didn't get any sleep and he doesn't know how much longer this can go on.

## 2019-01-01 ENCOUNTER — Encounter: Payer: Medicaid Other | Admitting: Podiatry

## 2019-01-09 ENCOUNTER — Other Ambulatory Visit: Payer: Self-pay | Admitting: Podiatry

## 2019-01-09 ENCOUNTER — Encounter: Payer: Self-pay | Admitting: Podiatry

## 2019-01-09 ENCOUNTER — Ambulatory Visit (INDEPENDENT_AMBULATORY_CARE_PROVIDER_SITE_OTHER): Payer: Medicaid Other | Admitting: Podiatry

## 2019-01-09 ENCOUNTER — Other Ambulatory Visit: Payer: Self-pay

## 2019-01-09 DIAGNOSIS — M7731 Calcaneal spur, right foot: Secondary | ICD-10-CM

## 2019-01-09 DIAGNOSIS — Z9889 Other specified postprocedural states: Secondary | ICD-10-CM

## 2019-01-09 MED ORDER — OXYCODONE-ACETAMINOPHEN 5-325 MG PO TABS: 1.0000 | ORAL_TABLET | ORAL | 0 refills | Status: DC | PRN

## 2019-01-09 NOTE — Telephone Encounter (Signed)
Dr. March Rummage ordered Gabapentin 300mg  #30 on 12/29/2018 and would be refill around 01/29/2019.

## 2019-01-21 ENCOUNTER — Telehealth: Payer: Self-pay | Admitting: *Deleted

## 2019-01-21 NOTE — Telephone Encounter (Signed)
Pt states he thinks he has been up on his foot too much and the rain is bothering him and he would like pain medication, what ever Dr. Amalia Hailey thinks.

## 2019-01-23 ENCOUNTER — Ambulatory Visit (INDEPENDENT_AMBULATORY_CARE_PROVIDER_SITE_OTHER): Payer: Medicaid Other | Admitting: Podiatry

## 2019-01-23 ENCOUNTER — Other Ambulatory Visit: Payer: Self-pay

## 2019-01-23 DIAGNOSIS — Z9889 Other specified postprocedural states: Secondary | ICD-10-CM

## 2019-01-23 MED ORDER — GABAPENTIN 300 MG PO CAPS: 300.0000 mg | ORAL_CAPSULE | Freq: Three times a day (TID) | ORAL | 0 refills | Status: DC

## 2019-01-23 MED ORDER — OXYCODONE-ACETAMINOPHEN 5-325 MG PO TABS: 1.0000 | ORAL_TABLET | ORAL | 0 refills | Status: DC | PRN

## 2019-01-23 NOTE — Progress Notes (Signed)
Subjective:  Patient ID: [name], male    DOB: 07-Sep-1960,  MRN: [id number]  Chief Complaint  Patient presents with  . Foot Pain    Neuritis follow up left foot. Pt states left lateral foot has been painful and getting worse past few days. Pt states previous injection was beneficial but temporary.   DOS: 10/22/2018 Procedure:             1) gastrocnemius recession left             2) Endoscopic plantar fasciotomy left             3) Inferior heel spur resection left  58 y.o. male returns for post-op check.  History as above.   Review of Systems: Negative except as noted in the HPI. Denies N/V/F/Ch.  Past Medical History:  Diagnosis Date  . Cardiomyopathy ([place]) 2009   per cardiac cath 12-10-2007 ef 35-40%;    last echo 02-01-2017n ef 55-60%  . COPD (chronic obstructive pulmonary disease) ([place])    previously seen pulmonologist-- dr Melvyn Novas--- GOLD 0-- hx multiple exacerbation's last one 06-09-2018 CAP with ARF (10-20-2018  per pt was followed by pcp and was clear;  pt was on symbicort bid and duoneb as needed per pt had not had it for past 3 months due to no insurance,  pt stated has productive smoker's cough but no sob or difficulty breathing)  . DDD (degenerative disc disease), cervical   . Heel spur, left   . History of nuclear stress test    epic 04-08-2015   low risk w/ small inferior wall scar of mild severity focus of reversible ischemia, ef 61%  . Hypertension    10-20-2018  per pt has not had medication , norvasc, in past 3 months due to no insurance  . Plantar fasciitis, left     Current Outpatient Medications:  .  albuterol (VENTOLIN HFA) 108 (90 Base) MCG/ACT inhaler, TWO PUFFS INHALED EVERY 4 TO 6 HOURS AS NEEDED FOR COUGH, WHEEZE, SHORTNESS OF BREATH, Disp: , Rfl:  .  gabapentin (NEURONTIN) 300 MG capsule, TAKE 1 CAPSULE BY MOUTH EVERYDAY AT BEDTIME, Disp: 30 capsule, Rfl: 0 .  methylPREDNISolone (MEDROL DOSEPAK) 4 MG TBPK tablet, 6 Day Taper Pack. Take as  Directed., Disp: 21 tablet, Rfl: 0 .  oxyCODONE-acetaminophen (PERCOCET) 5-325 MG tablet, Take 1 tablet by mouth every 4 (four) hours as needed for severe pain., Disp: 20 tablet, Rfl: 0 .  sildenafil (REVATIO) 20 MG tablet, TAKE 1 TO 2 TABLETS BY MOUTH ONE HOUR PRIOR TO SEXUAL ACTIVITY, Disp: , Rfl:  .  SYMBICORT 160-4.5 MCG/ACT inhaler, TWO PUFFS INHALED TWICE A DAY, Disp: , Rfl:  .  amLODipine (NORVASC) 5 MG tablet, Take 1 tablet (5 mg total) by mouth daily for 30 days. (Patient not taking: Reported on 10/20/2018), Disp: 30 tablet, Rfl: 0 .  gabapentin (NEURONTIN) 300 MG capsule, Take 1 capsule (300 mg total) by mouth 3 (three) times daily., Disp: 90 capsule, Rfl: 0  Social History   Tobacco Use  Smoking Status Current Every Day Smoker  . Packs/day: 1.00  . Years: 28.00  . Pack years: 28.00  . Types: Cigarettes  Smokeless Tobacco Never Used    No Known Allergies Objective:   There were no vitals filed for this visit. There is no height or weight on file to calculate BMI. Constitutional Well developed. Well nourished.  Vascular Foot warm and well perfused. Capillary refill normal to all digits.  Edema noted  left lower extremity.  Neurologic Normal speech. Oriented to person, place, and time. Epicritic sensation to light touch grossly present bilaterally except for sural nerve distribution. Tinel's sign at the calf shooting to the toes.  Dermatologic Skin well healed.  Orthopedic: Minimal tenderness to palpation noted about the surgical site.    Radiographs: None Assessment:   1. Post-operative state    Plan:  Patient was evaluated and treated and all questions answered.  S/p foot surgery left -Refill pain medication.  -Continue WBAT in normal shoe. -His symptoms have improved from the surgery other than the nerve symptoms.  Sural Neuritis -Injections delivered to the posterior aspect incision and at the sural nerve at the ankle.  Patient states that the symptoms  improved significantly after the injections.  Also refill patient's gabapentin and educated the patient that we will titrate up to 3 times per day.  We will consider repeat injection in 3 weeks.  Return in about 3 weeks (around 02/13/2019) for Post-op.

## 2019-01-23 NOTE — Progress Notes (Signed)
Subjective:  Patient ID: [name], male    DOB: 1960-09-29,  MRN: [id number]  Chief Complaint  Patient presents with  . Routine Post Op    DOS 10/22/2018 HEEL SPUR RESECTION, GASTROCNEMIUS RECESS, AND EPF LT; pt stated, "My foot still feels painful; Pain 5-6/10 when resting; Pain 2-3/10 when resting; every once in awhile, gets sharp pains in foot; 5th toe feels numb"  . Medication Refill    Oxycodone-acetaminophen 5-325 mg; pt stated, "I would like to get a refil"   DOS: 10/22/2018 Procedure:             1) gastrocnemius recession left             2) Endoscopic plantar fasciotomy left             3) Inferior heel spur resection left  58 y.o. male returns for post-op check.  History as above.   Review of Systems: Negative except as noted in the HPI. Denies N/V/F/Ch.  Past Medical History:  Diagnosis Date  . Cardiomyopathy ([place]) 2009   per cardiac cath 12-10-2007 ef 35-40%;    last echo 02-01-2017n ef 55-60%  . COPD (chronic obstructive pulmonary disease) ([place])    previously seen pulmonologist-- dr Melvyn Novas--- GOLD 0-- hx multiple exacerbation's last one 06-09-2018 CAP with ARF (10-20-2018  per pt was followed by pcp and was clear;  pt was on symbicort bid and duoneb as needed per pt had not had it for past 3 months due to no insurance,  pt stated has productive smoker's cough but no sob or difficulty breathing)  . DDD (degenerative disc disease), cervical   . Heel spur, left   . History of nuclear stress test    epic 04-08-2015   low risk w/ small inferior wall scar of mild severity focus of reversible ischemia, ef 61%  . Hypertension    10-20-2018  per pt has not had medication , norvasc, in past 3 months due to no insurance  . Plantar fasciitis, left     Current Outpatient Medications:  .  albuterol (VENTOLIN HFA) 108 (90 Base) MCG/ACT inhaler, TWO PUFFS INHALED EVERY 4 TO 6 HOURS AS NEEDED FOR COUGH, WHEEZE, SHORTNESS OF BREATH, Disp: , Rfl:  .  methylPREDNISolone  (MEDROL DOSEPAK) 4 MG TBPK tablet, 6 Day Taper Pack. Take as Directed., Disp: 21 tablet, Rfl: 0 .  oxyCODONE-acetaminophen (PERCOCET) 5-325 MG tablet, Take 1 tablet by mouth every 4 (four) hours as needed for severe pain., Disp: 20 tablet, Rfl: 0 .  sildenafil (REVATIO) 20 MG tablet, TAKE 1 TO 2 TABLETS BY MOUTH ONE HOUR PRIOR TO SEXUAL ACTIVITY, Disp: , Rfl:  .  SYMBICORT 160-4.5 MCG/ACT inhaler, TWO PUFFS INHALED TWICE A DAY, Disp: , Rfl:  .  amLODipine (NORVASC) 5 MG tablet, Take 1 tablet (5 mg total) by mouth daily for 30 days. (Patient not taking: Reported on 10/20/2018), Disp: 30 tablet, Rfl: 0 .  gabapentin (NEURONTIN) 300 MG capsule, TAKE 1 CAPSULE BY MOUTH EVERYDAY AT BEDTIME, Disp: 30 capsule, Rfl: 0  Social History   Tobacco Use  Smoking Status Current Every Day Smoker  . Packs/day: 1.00  . Years: 28.00  . Pack years: 28.00  . Types: Cigarettes  Smokeless Tobacco Never Used    No Known Allergies Objective:   There were no vitals filed for this visit. There is no height or weight on file to calculate BMI. Constitutional Well developed. Well nourished.  Vascular Foot warm and well perfused. Capillary refill  normal to all digits.  Edema noted left lower extremity.  Neurologic Normal speech. Oriented to person, place, and time. Epicritic sensation to light touch grossly present bilaterally except for sural nerve distribution. Sensation returning to 4th toe. Tinel's sign at the calf shooting to the toes.  Dermatologic Skin well healed.  Orthopedic: Minimal tenderness to palpation noted about the surgical site.    Radiographs: None Assessment:   1. Post-operative state   2. Heel spur, right    Plan:  Patient was evaluated and treated and all questions answered.  S/p foot surgery left -Refill pain medication.  -Continue WBAT in normal shoe. -His symptoms have improved from the surgery other than the nerve symptoms.  Sural Neuritis -Therapeutic injection delivered  to the posterior incision. Patient verbalized significant improvement in symptoms in his toes. This could represent entrapment. Could consider repeat injection vs neurolysis if patients symptoms had improved but recurred.  Return in about 2 weeks (around 01/23/2019) for Neuritis f/u .

## 2019-02-03 ENCOUNTER — Encounter (HOSPITAL_COMMUNITY): Payer: Self-pay | Admitting: Emergency Medicine

## 2019-02-03 ENCOUNTER — Other Ambulatory Visit: Payer: Self-pay

## 2019-02-03 ENCOUNTER — Emergency Department (HOSPITAL_COMMUNITY)
Admission: EM | Admit: 2019-02-03 | Discharge: 2019-02-04 | Discharge status: Against Medical Advice | Payer: Medicaid Other | Attending: Emergency Medicine | Admitting: Emergency Medicine

## 2019-02-03 ENCOUNTER — Emergency Department (HOSPITAL_COMMUNITY): Payer: Medicaid Other

## 2019-02-03 DIAGNOSIS — Z5321 Procedure and treatment not carried out due to patient leaving prior to being seen by health care provider: Secondary | ICD-10-CM | POA: Insufficient documentation

## 2019-02-03 DIAGNOSIS — R0602 Shortness of breath: Secondary | ICD-10-CM | POA: Diagnosis present

## 2019-02-03 LAB — CBC
HCT: 48.9 % (ref 39.0–52.0)
Hemoglobin: 15.8 g/dL (ref 13.0–17.0)
MCH: 29.2 pg (ref 26.0–34.0)
MCHC: 32.3 g/dL (ref 30.0–36.0)
MCV: 90.2 fL (ref 80.0–100.0)
Platelets: 256 10*3/uL (ref 150–400)
RBC: 5.42 MIL/uL (ref 4.22–5.81)
RDW: 14.4 % (ref 11.5–15.5)
WBC: 7.7 10*3/uL (ref 4.0–10.5)
nRBC: 0 % (ref 0.0–0.2)

## 2019-02-03 LAB — BASIC METABOLIC PANEL
Anion gap: 11 (ref 5–15)
BUN: 11 mg/dL (ref 6–20)
CO2: 21 mmol/L — ABNORMAL LOW (ref 22–32)
Calcium: 9.6 mg/dL (ref 8.9–10.3)
Chloride: 106 mmol/L (ref 98–111)
Creatinine, Ser: 1.09 mg/dL (ref 0.61–1.24)
GFR calc Af Amer: >60 mL/min (ref >60)
GFR calc non Af Amer: >60 mL/min (ref >60)
Glucose, Bld: 96 mg/dL (ref 70–99)
Potassium: 4.1 mmol/L (ref 3.5–5.1)
Sodium: 138 mmol/L (ref 135–145)

## 2019-02-03 LAB — TROPONIN I (HIGH SENSITIVITY): Troponin I (High Sensitivity): 5 ng/L (ref <18)

## 2019-02-03 MED ORDER — ALBUTEROL SULFATE HFA 108 (90 BASE) MCG/ACT IN AERS
8.0000 | INHALATION_SPRAY | Freq: Once | RESPIRATORY_TRACT | Status: AC
  Administered 2019-02-03: 19:00:00 8 via RESPIRATORY_TRACT
  Filled 2019-02-03: qty 6.7

## 2019-02-03 NOTE — ED Triage Notes (Signed)
Pt here from home with c/o sob times 2 days,pt stills smokes using his inhaler but has not helped , pt also has been having some slight chest pain

## 2019-02-12 ENCOUNTER — Ambulatory Visit (INDEPENDENT_AMBULATORY_CARE_PROVIDER_SITE_OTHER): Payer: Medicaid Other | Admitting: Podiatry

## 2019-02-12 ENCOUNTER — Other Ambulatory Visit: Payer: Self-pay

## 2019-02-12 ENCOUNTER — Ambulatory Visit (INDEPENDENT_AMBULATORY_CARE_PROVIDER_SITE_OTHER): Payer: Medicaid Other

## 2019-02-12 DIAGNOSIS — M79672 Pain in left foot: Secondary | ICD-10-CM

## 2019-02-12 DIAGNOSIS — S91332A Puncture wound without foreign body, left foot, initial encounter: Secondary | ICD-10-CM

## 2019-02-12 DIAGNOSIS — S99922A Unspecified injury of left foot, initial encounter: Secondary | ICD-10-CM

## 2019-02-12 MED ORDER — OXYCODONE-ACETAMINOPHEN 5-325 MG PO TABS: 1.0000 | ORAL_TABLET | ORAL | 0 refills | Status: DC | PRN

## 2019-02-12 MED ORDER — CIPROFLOXACIN HCL 500 MG PO TABS: 500.0000 mg | ORAL_TABLET | Freq: Two times a day (BID) | ORAL | 0 refills | Status: DC

## 2019-02-13 ENCOUNTER — Ambulatory Visit: Payer: Medicaid Other | Admitting: Podiatry

## 2019-02-23 ENCOUNTER — Other Ambulatory Visit: Payer: Self-pay | Admitting: Podiatry

## 2019-02-27 ENCOUNTER — Other Ambulatory Visit: Payer: Self-pay

## 2019-02-27 ENCOUNTER — Ambulatory Visit: Payer: Medicaid Other | Admitting: Podiatry

## 2019-02-27 DIAGNOSIS — S91332D Puncture wound without foreign body, left foot, subsequent encounter: Secondary | ICD-10-CM

## 2019-02-27 NOTE — Progress Notes (Signed)
Subjective:  Patient ID: [name], male    DOB: 03-Jul-1960,  MRN: [id number]  Chief Complaint  Patient presents with  . Foot Injury    Pt states stepped on three prong plug last night, has severe pain in left foot.   DOS: 10/22/2018 Procedure:             1) gastrocnemius recession left             2) Endoscopic plantar fasciotomy left             3) Inferior heel spur resection left  58 y.o. male returns for post-op check.  History as above. States he was doing a lot better from the injections but now he stepped on a 3 prong plug on his left foot yesterday.  Review of Systems: Negative except as noted in the HPI. Denies N/V/F/Ch.  Past Medical History:  Diagnosis Date  . Cardiomyopathy ([place]) 2009   per cardiac cath 12-10-2007 ef 35-40%;    last echo 02-01-2017n ef 55-60%  . COPD (chronic obstructive pulmonary disease) ([place])    previously seen pulmonologist-- dr Melvyn Novas--- GOLD 0-- hx multiple exacerbation's last one 06-09-2018 CAP with ARF (10-20-2018  per pt was followed by pcp and was clear;  pt was on symbicort bid and duoneb as needed per pt had not had it for past 3 months due to no insurance,  pt stated has productive smoker's cough but no sob or difficulty breathing)  . DDD (degenerative disc disease), cervical   . Heel spur, left   . History of nuclear stress test    epic 04-08-2015   low risk w/ small inferior wall scar of mild severity focus of reversible ischemia, ef 61%  . Hypertension    10-20-2018  per pt has not had medication , norvasc, in past 3 months due to no insurance  . Plantar fasciitis, left     Current Outpatient Medications:  .  albuterol (VENTOLIN HFA) 108 (90 Base) MCG/ACT inhaler, TWO PUFFS INHALED EVERY 4 TO 6 HOURS AS NEEDED FOR COUGH, WHEEZE, SHORTNESS OF BREATH, Disp: , Rfl:  .  gabapentin (NEURONTIN) 300 MG capsule, TAKE 1 CAPSULE BY MOUTH EVERYDAY AT BEDTIME, Disp: 30 capsule, Rfl: 0 .  methylPREDNISolone (MEDROL DOSEPAK) 4 MG TBPK  tablet, 6 Day Taper Pack. Take as Directed., Disp: 21 tablet, Rfl: 0 .  oxyCODONE-acetaminophen (PERCOCET) 5-325 MG tablet, Take 1 tablet by mouth every 4 (four) hours as needed for severe pain., Disp: 20 tablet, Rfl: 0 .  sildenafil (REVATIO) 20 MG tablet, TAKE 1 TO 2 TABLETS BY MOUTH ONE HOUR PRIOR TO SEXUAL ACTIVITY, Disp: , Rfl:  .  SYMBICORT 160-4.5 MCG/ACT inhaler, TWO PUFFS INHALED TWICE A DAY, Disp: , Rfl:  .  amLODipine (NORVASC) 5 MG tablet, Take 1 tablet (5 mg total) by mouth daily for 30 days. (Patient not taking: Reported on 10/20/2018), Disp: 30 tablet, Rfl: 0 .  ciprofloxacin (CIPRO) 500 MG tablet, Take 1 tablet (500 mg total) by mouth 2 (two) times daily., Disp: 14 tablet, Rfl: 0 .  gabapentin (NEURONTIN) 300 MG capsule, TAKE 1 CAPSULE BY MOUTH THREE TIMES A DAY, Disp: 90 capsule, Rfl: 2  Social History   Tobacco Use  Smoking Status Current Every Day Smoker  . Packs/day: 1.00  . Years: 28.00  . Pack years: 28.00  . Types: Cigarettes  Smokeless Tobacco Never Used    No Known Allergies Objective:   There were no vitals filed for this visit.  There is no height or weight on file to calculate BMI. Constitutional Well developed. Well nourished.  Vascular Foot warm and well perfused. Capillary refill normal to all digits.  Edema noted left lower extremity.  Neurologic Normal speech. Oriented to person, place, and time. Epicritic sensation to light touch grossly present bilaterally except for sural nerve distribution. Tinel's sign at the calf shooting to the toes.  Dermatologic Surgical incisions well healed.   Orthopedic: Minimal tenderness to palpation noted about the surgical site.    Radiographs: None Assessment:   1. Injury of left foot, initial encounter   2. Puncture wound of left foot, initial encounter    Plan:  Patient was evaluated and treated and all questions answered.   S/p foot surgery left -Refill pain medication.  -Continue WBAT in normal  shoe. -His symptoms have improved from the surgery other than the nerve symptoms.  Sural Neuritis -Improving, no injection today given injury  Puncture Wound left foot -Debrided, no signs of infection. Rx ppx Cipro  Return in about 2 weeks (around 02/26/2019) for puncture wound f/u .

## 2019-03-02 ENCOUNTER — Other Ambulatory Visit: Payer: Self-pay | Admitting: Podiatry

## 2019-03-02 ENCOUNTER — Telehealth: Payer: Self-pay | Admitting: *Deleted

## 2019-03-02 MED ORDER — HYDROCODONE-ACETAMINOPHEN 5-325 MG PO TABS: 1.0000 | ORAL_TABLET | Freq: Four times a day (QID) | ORAL | 0 refills | Status: DC | PRN

## 2019-03-02 NOTE — Progress Notes (Signed)
Rx sent to pharmacy   

## 2019-03-02 NOTE — Telephone Encounter (Signed)
Pt request refill of pain medication, stating he had a really bad weekend.

## 2019-03-02 NOTE — Telephone Encounter (Signed)
Pt states he is not able to sleep and works but can't sleep states he has done everything like we asked, but he needs something for the pain.

## 2019-03-02 NOTE — Telephone Encounter (Signed)
I called pt and he states that Friday and Saturday, but Sunday it was terrible sharp pulsing pain and if he lays on the left side of the foot it really hurts, he has to lay on the other side to take the pressure off, but can't sleep.

## 2019-03-02 NOTE — Telephone Encounter (Signed)
Unable to speak to pt on [phone], recording states the number was invalid. Home phone [phone] line was disconnected.

## 2019-03-02 NOTE — Telephone Encounter (Signed)
Sent Rx 

## 2019-03-02 NOTE — Telephone Encounter (Signed)
Unable to leave a message on mobile [phone] the mailbox is full.

## 2019-03-16 IMAGING — CR DG CHEST 2V
2 series · 2 of 2 positions shown · non-contrast
Comparison: 12/20/2017 chest radiograph

CLINICAL DATA: 57 y/o  M; toothache and shortness of breath.

EXAM:
CHEST - 2 VIEW

[chest pa]
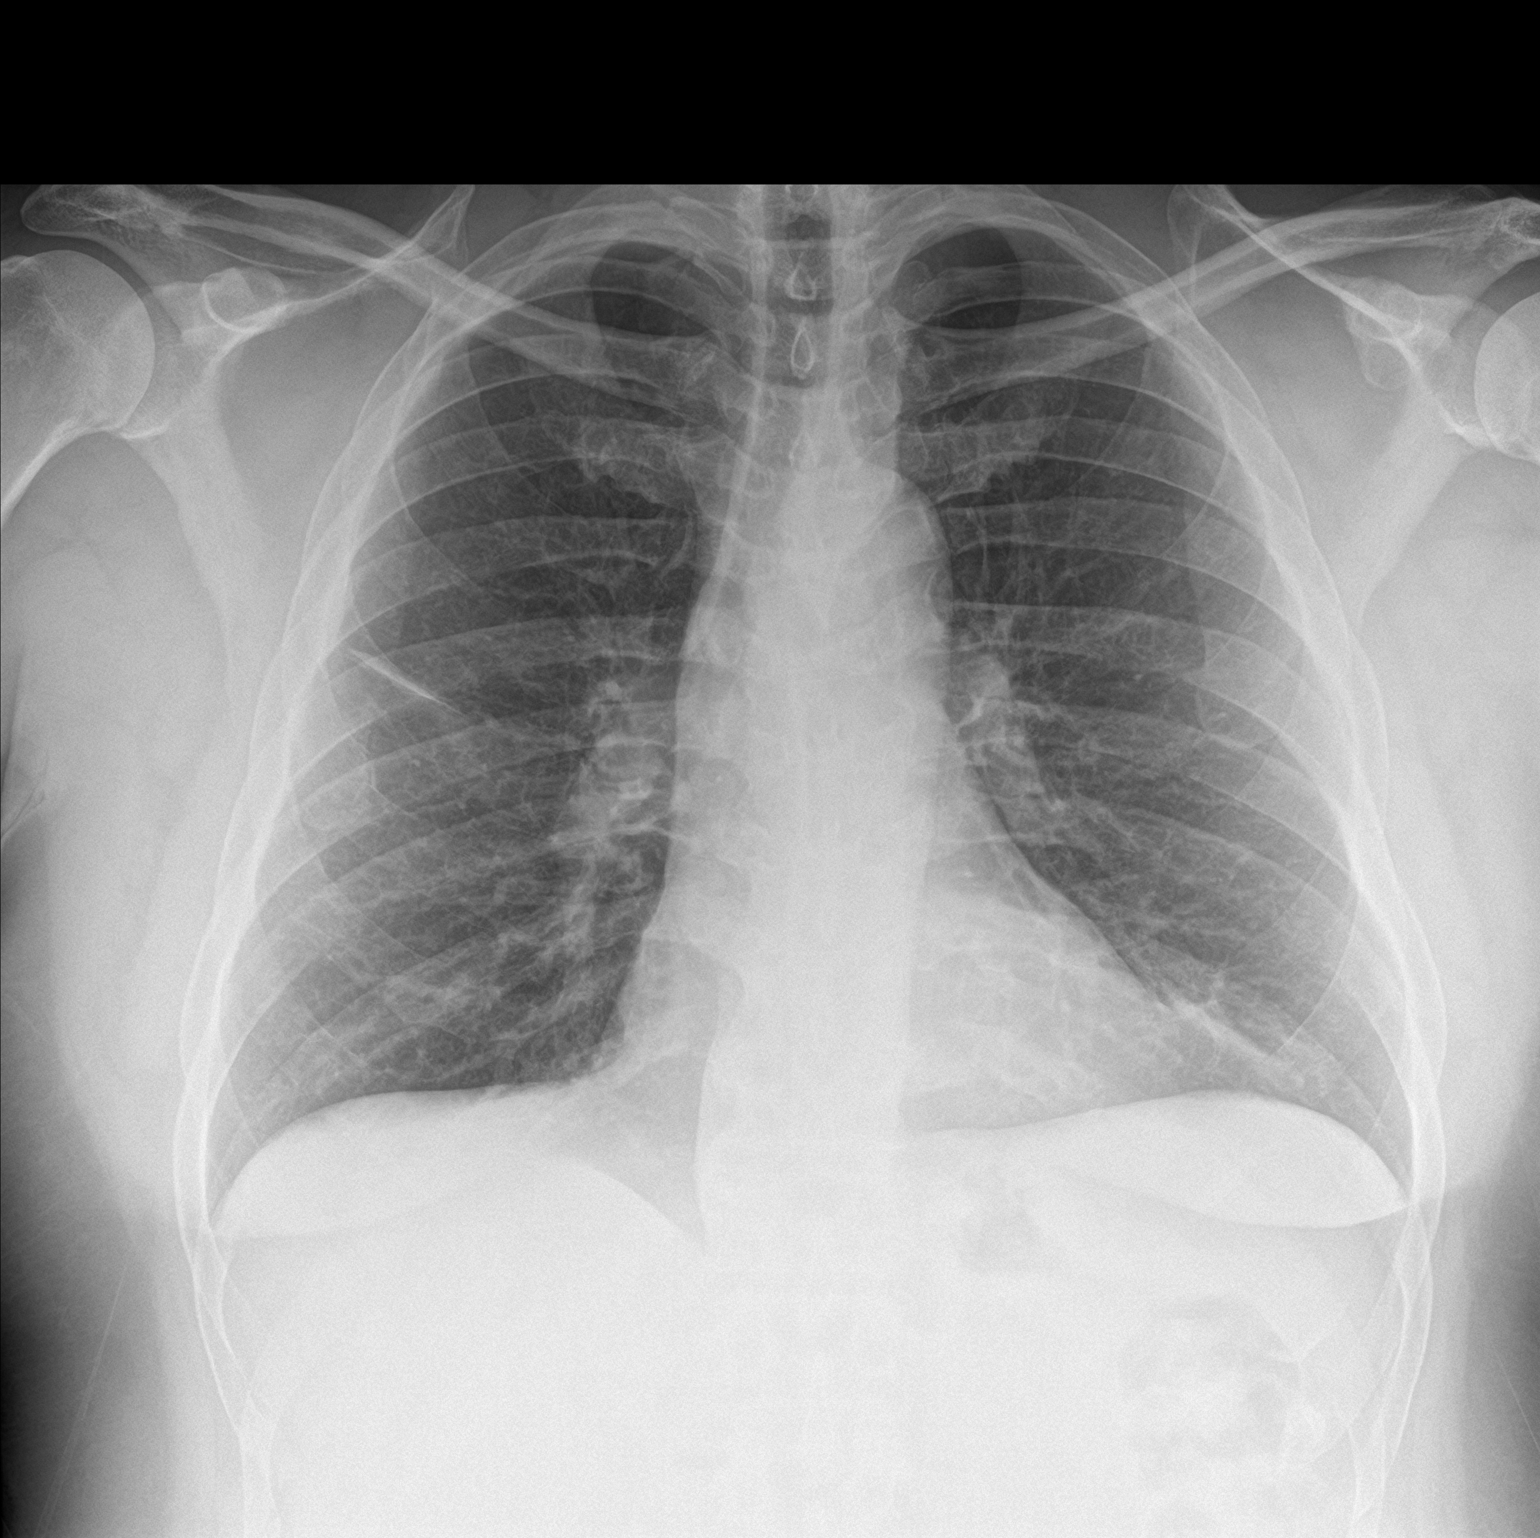

[chest lat]
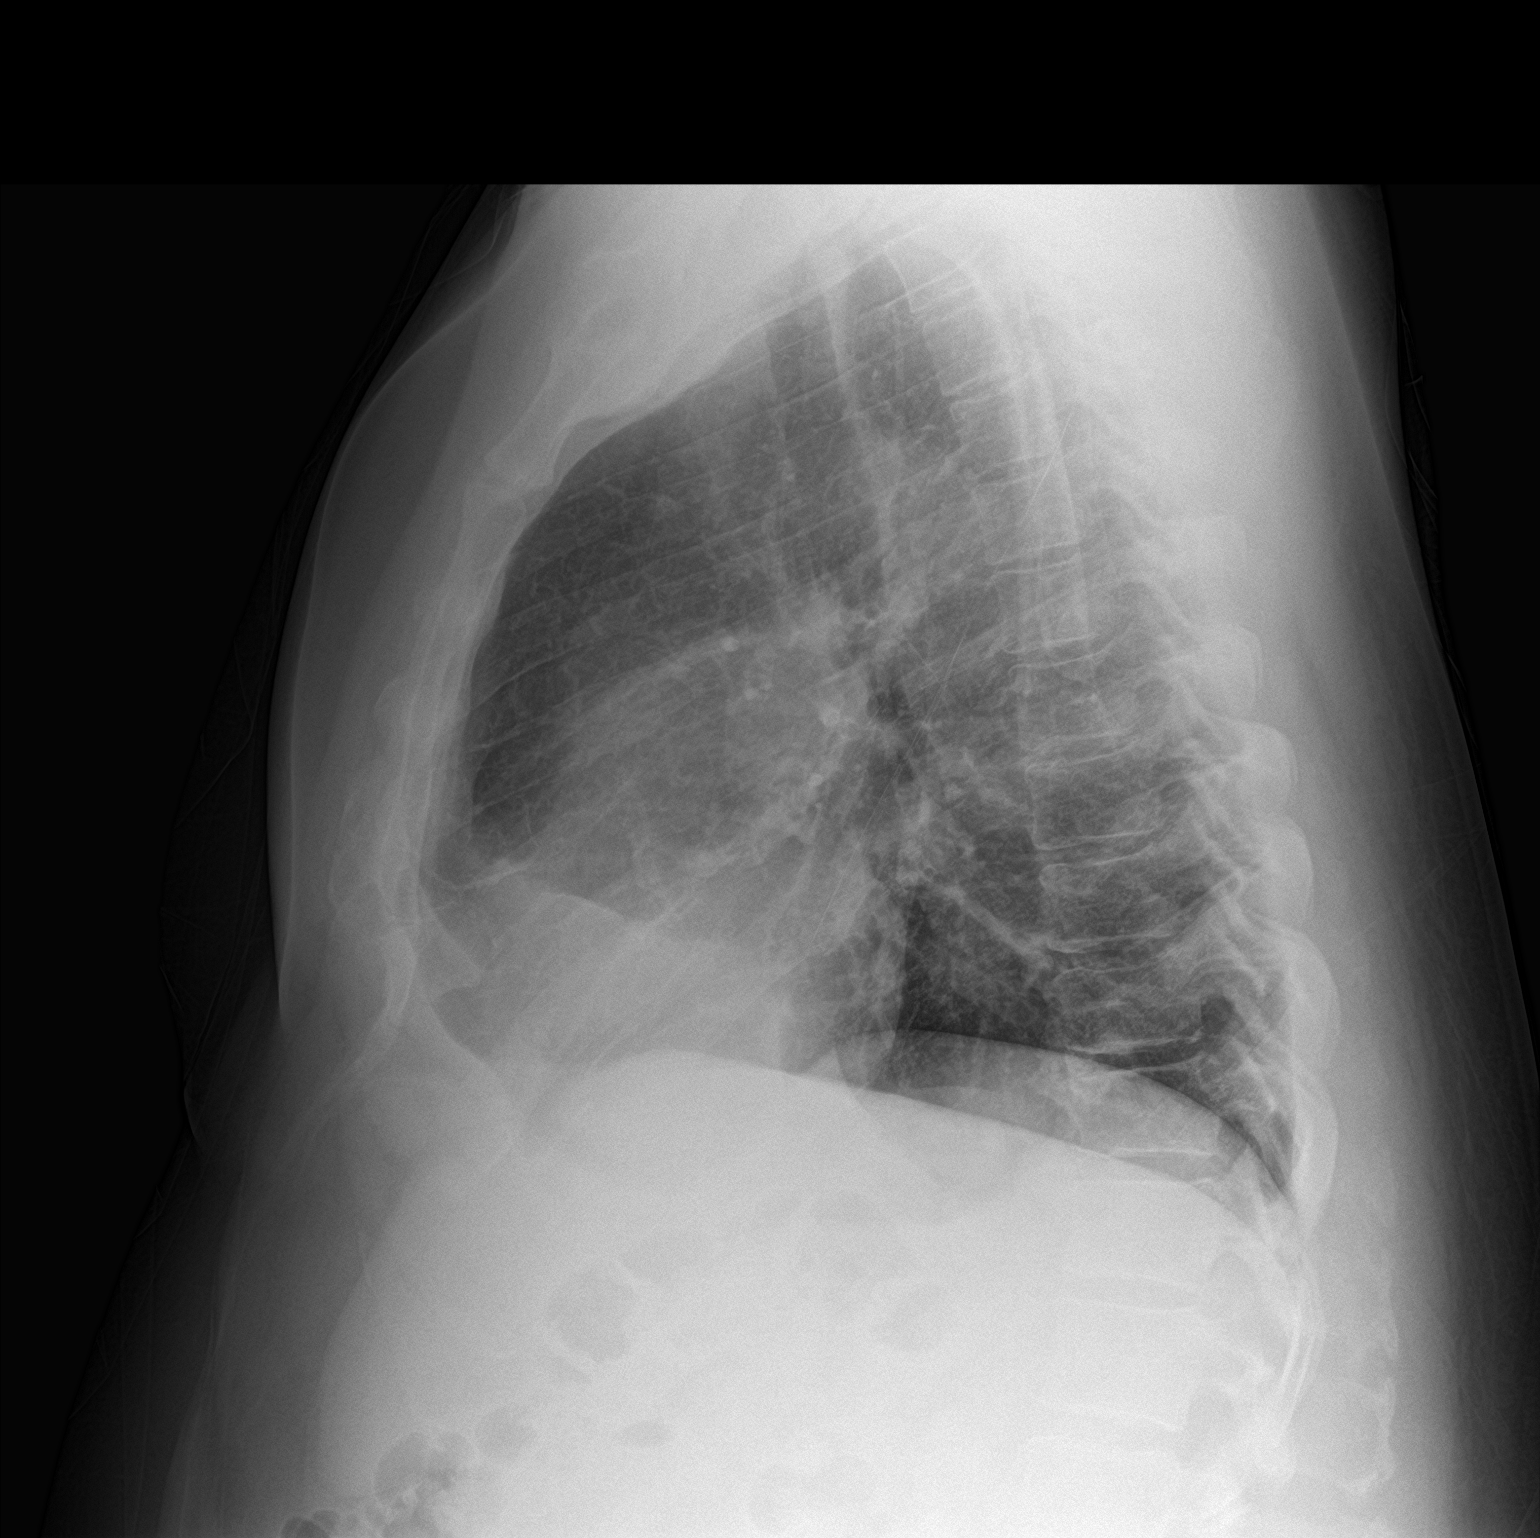

[2 of 2 positions shown; findings below may reference images not displayed]

FINDINGS: Stable heart size and mediastinal contours are within normal limits.
Linear opacities in right mid and left lower lung zones are
compatible with platelike atelectasis or scarring. No consolidation,
effusion, or pneumothorax. No acute osseous abnormality is evident.
IMPRESSION: No acute pulmonary process identified. Platelike atelectasis in
right mid and left lower lung zones.

## 2019-03-29 NOTE — Progress Notes (Signed)
Subjective:  Patient ID: [name], male    DOB: 08/20/1960,  MRN: [id number]  Chief Complaint  Patient presents with  . Foot Injury    Pt states foot is healing well from puncture wound in plantar foot. Denies drainage.  . Foot Problem    Pt states still having numbness in left foot.   DOS: 10/22/2018 Procedure:             1) gastrocnemius recession left             2) Endoscopic plantar fasciotomy left             3) Inferior heel spur resection left  59 y.o. male returns for post-op check.  History as above.  States that the puncture wound is doing very well still having the numbness in the foot  Review of Systems: Negative except as noted in the HPI. Denies N/V/F/Ch.  Past Medical History:  Diagnosis Date  . Cardiomyopathy ([place]) 2009   per cardiac cath 12-10-2007 ef 35-40%;    last echo 02-01-2017n ef 55-60%  . COPD (chronic obstructive pulmonary disease) ([place])    previously seen pulmonologist-- dr Melvyn Novas--- GOLD 0-- hx multiple exacerbation's last one 06-09-2018 CAP with ARF (10-20-2018  per pt was followed by pcp and was clear;  pt was on symbicort bid and duoneb as needed per pt had not had it for past 3 months due to no insurance,  pt stated has productive smoker's cough but no sob or difficulty breathing)  . DDD (degenerative disc disease), cervical   . Heel spur, left   . History of nuclear stress test    epic 04-08-2015   low risk w/ small inferior wall scar of mild severity focus of reversible ischemia, ef 61%  . Hypertension    10-20-2018  per pt has not had medication , norvasc, in past 3 months due to no insurance  . Plantar fasciitis, left     Current Outpatient Medications:  .  albuterol (VENTOLIN HFA) 108 (90 Base) MCG/ACT inhaler, TWO PUFFS INHALED EVERY 4 TO 6 HOURS AS NEEDED FOR COUGH, WHEEZE, SHORTNESS OF BREATH, Disp: , Rfl:  .  ciprofloxacin (CIPRO) 500 MG tablet, Take 1 tablet (500 mg total) by mouth 2 (two) times daily., Disp: 14 tablet, Rfl:  0 .  gabapentin (NEURONTIN) 300 MG capsule, TAKE 1 CAPSULE BY MOUTH EVERYDAY AT BEDTIME, Disp: 30 capsule, Rfl: 0 .  gabapentin (NEURONTIN) 300 MG capsule, TAKE 1 CAPSULE BY MOUTH THREE TIMES A DAY, Disp: 90 capsule, Rfl: 2 .  methylPREDNISolone (MEDROL DOSEPAK) 4 MG TBPK tablet, 6 Day Taper Pack. Take as Directed., Disp: 21 tablet, Rfl: 0 .  oxyCODONE-acetaminophen (PERCOCET) 5-325 MG tablet, Take 1 tablet by mouth every 4 (four) hours as needed for severe pain., Disp: 20 tablet, Rfl: 0 .  sildenafil (REVATIO) 20 MG tablet, TAKE 1 TO 2 TABLETS BY MOUTH ONE HOUR PRIOR TO SEXUAL ACTIVITY, Disp: , Rfl:  .  SYMBICORT 160-4.5 MCG/ACT inhaler, TWO PUFFS INHALED TWICE A DAY, Disp: , Rfl:  .  amLODipine (NORVASC) 5 MG tablet, Take 1 tablet (5 mg total) by mouth daily for 30 days. (Patient not taking: Reported on 10/20/2018), Disp: 30 tablet, Rfl: 0 .  HYDROcodone-acetaminophen (NORCO) 5-325 MG tablet, Take 1 tablet by mouth every 6 (six) hours as needed for moderate pain., Disp: 15 tablet, Rfl: 0  Social History   Tobacco Use  Smoking Status Current Every Day Smoker  . Packs/day: 1.00  .  Years: 28.00  . Pack years: 28.00  . Types: Cigarettes  Smokeless Tobacco Never Used    No Known Allergies Objective:   There were no vitals filed for this visit. There is no height or weight on file to calculate BMI. Constitutional Well developed. Well nourished.  Vascular Foot warm and well perfused. Capillary refill normal to all digits.  Edema noted left lower extremity.  Neurologic Normal speech. Oriented to person, place, and time. Epicritic sensation to light touch grossly present bilaterally except for sural nerve distribution. Tinel's sign at the calf shooting to the toes.  Dermatologic Surgical incisions well healed.  Puncture site healed  Orthopedic: Minimal tenderness to palpation noted about the surgical site.    Radiographs: None Assessment:   1. Puncture wound of left foot, subsequent  encounter    Plan:  Patient was evaluated and treated and all questions answered.   S/p foot surgery left -Refill pain medication.  -Continue WBAT in normal shoe. -His symptoms have improved from the surgery other than the nerve symptoms.  Sural Neuritis -Injections delivered to the sural nerve both at the incision site and at the distal aspect of the wound.  Covered under global  Puncture Wound left foot -Improving no residual wound  Return in about 4 weeks (around 03/27/2019) for Neuroma, Plantar fasciitis, Left.

## 2019-04-02 ENCOUNTER — Other Ambulatory Visit: Payer: Self-pay

## 2019-04-02 ENCOUNTER — Ambulatory Visit: Payer: Medicaid Other | Admitting: Podiatry

## 2019-04-02 DIAGNOSIS — M792 Neuralgia and neuritis, unspecified: Secondary | ICD-10-CM

## 2019-04-02 MED ORDER — ZOLPIDEM TARTRATE 5 MG PO TABS: 5.0000 mg | ORAL_TABLET | Freq: Every evening | ORAL | 0 refills | Status: DC | PRN

## 2019-04-02 MED ORDER — TRAMADOL HCL 50 MG PO TABS: 50.0000 mg | ORAL_TABLET | Freq: Three times a day (TID) | ORAL | 0 refills | Status: DC | PRN

## 2019-04-02 NOTE — Progress Notes (Signed)
  Subjective:  Patient ID: [name], male    DOB: 03-Jan-1961,  MRN: [id number]  Chief Complaint  Patient presents with  . Neuroma    4 wk f/u Neuroma, Plantar fasciitis, Left.    59 y.o. male presents with the above complaint. History confirmed with patient.  States that some mornings are much better he is having trouble sleeping at night however still having significant numbness.  Objective:  Physical Exam: warm, good capillary refill, no trophic changes or ulcerative lesions, normal DP and PT pulses Left Foot: normal exam, no swelling, tenderness, instability; ligaments intact, full range of motion of all ankle/foot joints; absent sensation distal sural nerve distribution with Tinel's over the incision to the sural nerve distribution Right Foot: normal exam, no swelling, tenderness, instability; ligaments intact, full range of motion of all ankle/foot joints   Assessment:   1. Neuritis      Plan:  Patient was evaluated and treated and all questions answered.  Neuritis -Injection delivered at the incision and at the distal sural nerve -Discussed patient that should pain persist would consider possible nerve decompression surgery versus attempt at sural nerve repair should pain persist.  We will discuss more next visit   Procedure: Nerve injection Location: Left sural nerve proximally Skin Prep: Alcohol. Injectate: 1 mL Marcaine half percent plain, 1 cc dexamehtasone, 0.5 cc Kenalog 10 Disposition: Patient tolerated procedure well. Injection site dressed with a band-aid.   Procedure: Nerve injection Location: Left sural nerve distally Skin Prep: Alcohol. Injectate : 1 mL Marcaine half percent plain, 1 cc dexamehtasone, 0.5 cc Kenalog 10 Disposition: Patient tolerated procedure well. Injection site dressed with a band-aid.   No follow-ups on file.   MDM

## 2019-04-22 ENCOUNTER — Ambulatory Visit: Payer: Medicaid Other | Attending: Internal Medicine

## 2019-04-22 DIAGNOSIS — Z20822 Contact with and (suspected) exposure to covid-19: Secondary | ICD-10-CM

## 2019-04-23 ENCOUNTER — Emergency Department (HOSPITAL_COMMUNITY): Payer: Medicaid Other

## 2019-04-23 ENCOUNTER — Other Ambulatory Visit: Payer: Self-pay

## 2019-04-23 ENCOUNTER — Inpatient Hospital Stay (HOSPITAL_COMMUNITY)
Admission: EM | Admit: 2019-04-23 | Discharge: 2019-04-25 | DRG: 190 | Disposition: A | Discharge status: Home / Self Care | Payer: Medicaid Other | Attending: Family Medicine | Admitting: Family Medicine

## 2019-04-23 ENCOUNTER — Encounter (HOSPITAL_COMMUNITY): Payer: Self-pay | Admitting: Internal Medicine

## 2019-04-23 ENCOUNTER — Ambulatory Visit: Payer: Medicaid Other | Admitting: Podiatry

## 2019-04-23 DIAGNOSIS — J9601 Acute respiratory failure with hypoxia: Secondary | ICD-10-CM | POA: Diagnosis not present

## 2019-04-23 DIAGNOSIS — M5030 Other cervical disc degeneration, unspecified cervical region: Secondary | ICD-10-CM | POA: Diagnosis present

## 2019-04-23 DIAGNOSIS — Z8249 Family history of ischemic heart disease and other diseases of the circulatory system: Secondary | ICD-10-CM

## 2019-04-23 DIAGNOSIS — Z720 Tobacco use: Secondary | ICD-10-CM

## 2019-04-23 DIAGNOSIS — J42 Unspecified chronic bronchitis: Secondary | ICD-10-CM

## 2019-04-23 DIAGNOSIS — Z20822 Contact with and (suspected) exposure to covid-19: Secondary | ICD-10-CM | POA: Diagnosis present

## 2019-04-23 DIAGNOSIS — Z7951 Long term (current) use of inhaled steroids: Secondary | ICD-10-CM

## 2019-04-23 DIAGNOSIS — R197 Diarrhea, unspecified: Secondary | ICD-10-CM | POA: Diagnosis present

## 2019-04-23 DIAGNOSIS — I10 Essential (primary) hypertension: Secondary | ICD-10-CM | POA: Diagnosis present

## 2019-04-23 DIAGNOSIS — G629 Polyneuropathy, unspecified: Secondary | ICD-10-CM | POA: Diagnosis present

## 2019-04-23 DIAGNOSIS — J449 Chronic obstructive pulmonary disease, unspecified: Secondary | ICD-10-CM | POA: Diagnosis present

## 2019-04-23 DIAGNOSIS — J441 Chronic obstructive pulmonary disease with (acute) exacerbation: Principal | ICD-10-CM | POA: Diagnosis present

## 2019-04-23 DIAGNOSIS — F17210 Nicotine dependence, cigarettes, uncomplicated: Secondary | ICD-10-CM | POA: Diagnosis present

## 2019-04-23 DIAGNOSIS — I428 Other cardiomyopathies: Secondary | ICD-10-CM | POA: Diagnosis present

## 2019-04-23 DIAGNOSIS — Z809 Family history of malignant neoplasm, unspecified: Secondary | ICD-10-CM

## 2019-04-23 DIAGNOSIS — Z23 Encounter for immunization: Secondary | ICD-10-CM

## 2019-04-23 LAB — BASIC METABOLIC PANEL
Anion gap: 5 (ref 5–15)
BUN: 9 mg/dL (ref 6–20)
CO2: 24 mmol/L (ref 22–32)
Calcium: 9.2 mg/dL (ref 8.9–10.3)
Chloride: 111 mmol/L (ref 98–111)
Creatinine, Ser: 0.95 mg/dL (ref 0.61–1.24)
GFR calc Af Amer: >60 mL/min (ref >60)
GFR calc non Af Amer: >60 mL/min (ref >60)
Glucose, Bld: 109 mg/dL — ABNORMAL HIGH (ref 70–99)
Potassium: 4.1 mmol/L (ref 3.5–5.1)
Sodium: 140 mmol/L (ref 135–145)

## 2019-04-23 LAB — MAGNESIUM: Magnesium: 2.2 mg/dL (ref 1.7–2.4)

## 2019-04-23 LAB — RESPIRATORY PANEL BY RT PCR (FLU A&B, COVID)
Influenza A by PCR: NEGATIVE
Influenza B by PCR: NEGATIVE
SARS Coronavirus 2 by RT PCR: NEGATIVE

## 2019-04-23 LAB — BLOOD GAS, ARTERIAL
Acid-base deficit: 1.9 mmol/L (ref 0.0–2.0)
Bicarbonate: 22.7 mmol/L (ref 20.0–28.0)
O2 Saturation: 95.9 %
Patient temperature: 98.6
pCO2 arterial: 40.5 mmHg (ref 32.0–48.0)
pH, Arterial: 7.368 (ref 7.350–7.450)
pO2, Arterial: 82.9 mmHg — ABNORMAL LOW (ref 83.0–108.0)

## 2019-04-23 LAB — CBC WITH DIFFERENTIAL/PLATELET
Abs Immature Granulocytes: 0.03 10*3/uL (ref 0.00–0.07)
Basophils Absolute: 0 10*3/uL (ref 0.0–0.1)
Basophils Relative: 0 %
Eosinophils Absolute: 0 10*3/uL (ref 0.0–0.5)
Eosinophils Relative: 0 %
HCT: 49.4 % (ref 39.0–52.0)
Hemoglobin: 16.1 g/dL (ref 13.0–17.0)
Immature Granulocytes: 1 %
Lymphocytes Relative: 12 %
Lymphs Abs: 0.8 10*3/uL (ref 0.7–4.0)
MCH: 29.9 pg (ref 26.0–34.0)
MCHC: 32.6 g/dL (ref 30.0–36.0)
MCV: 91.8 fL (ref 80.0–100.0)
Monocytes Absolute: 0.1 10*3/uL (ref 0.1–1.0)
Monocytes Relative: 1 %
Neutro Abs: 5.8 10*3/uL (ref 1.7–7.7)
Neutrophils Relative %: 86 %
Platelets: 237 10*3/uL (ref 150–400)
RBC: 5.38 MIL/uL (ref 4.22–5.81)
RDW: 14.1 % (ref 11.5–15.5)
WBC: 6.7 10*3/uL (ref 4.0–10.5)
nRBC: 0 % (ref 0.0–0.2)

## 2019-04-23 LAB — LACTIC ACID, PLASMA: Lactic Acid, Venous: 1.1 mmol/L (ref 0.5–1.9)

## 2019-04-23 LAB — NOVEL CORONAVIRUS, NAA: SARS-CoV-2, NAA: NOT DETECTED

## 2019-04-23 LAB — BRAIN NATRIURETIC PEPTIDE: B Natriuretic Peptide: 44.6 pg/mL (ref 0.0–100.0)

## 2019-04-23 MED ORDER — GABAPENTIN 300 MG PO CAPS
300.0000 mg | ORAL_CAPSULE | Freq: Three times a day (TID) | ORAL | Status: DC
  Administered 2019-04-23 – 2019-04-25 (×6): 300 mg via ORAL
  Filled 2019-04-23 (×6): qty 1

## 2019-04-23 MED ORDER — ALBUTEROL (5 MG/ML) CONTINUOUS INHALATION SOLN
10.0000 mg/h | INHALATION_SOLUTION | Freq: Once | RESPIRATORY_TRACT | Status: AC
  Administered 2019-04-23: 10 mg/h via RESPIRATORY_TRACT
  Filled 2019-04-23: qty 20

## 2019-04-23 MED ORDER — HYDROCODONE-ACETAMINOPHEN 5-325 MG PO TABS
1.0000 | ORAL_TABLET | Freq: Four times a day (QID) | ORAL | Status: DC | PRN
  Administered 2019-04-23: 1 via ORAL
  Filled 2019-04-23: qty 1

## 2019-04-23 MED ORDER — MORPHINE SULFATE (PF) 4 MG/ML IV SOLN
4.0000 mg | Freq: Once | INTRAVENOUS | Status: AC
  Administered 2019-04-23: 4 mg via INTRAVENOUS
  Filled 2019-04-23: qty 1

## 2019-04-23 MED ORDER — ALBUTEROL SULFATE HFA 108 (90 BASE) MCG/ACT IN AERS
4.0000 | INHALATION_SPRAY | Freq: Once | RESPIRATORY_TRACT | Status: AC
  Administered 2019-04-23: 4 via RESPIRATORY_TRACT
  Filled 2019-04-23: qty 6.7

## 2019-04-23 MED ORDER — ONDANSETRON HCL 4 MG/2ML IJ SOLN
4.0000 mg | Freq: Once | INTRAMUSCULAR | Status: AC
  Administered 2019-04-23: 4 mg via INTRAVENOUS
  Filled 2019-04-23: qty 2

## 2019-04-23 MED ORDER — MOMETASONE FURO-FORMOTEROL FUM 200-5 MCG/ACT IN AERO
2.0000 | INHALATION_SPRAY | Freq: Two times a day (BID) | RESPIRATORY_TRACT | Status: DC
  Administered 2019-04-23 – 2019-04-25 (×4): 2 via RESPIRATORY_TRACT
  Filled 2019-04-23: qty 8.8

## 2019-04-23 MED ORDER — AEROCHAMBER PLUS FLO-VU MEDIUM MISC
1.0000 | Freq: Once | Status: AC
  Administered 2019-04-23: 1
  Filled 2019-04-23: qty 1

## 2019-04-23 MED ORDER — GUAIFENESIN-DM 100-10 MG/5ML PO SYRP
10.0000 mL | ORAL_SOLUTION | Freq: Four times a day (QID) | ORAL | Status: DC
  Administered 2019-04-23 – 2019-04-25 (×7): 10 mL via ORAL
  Filled 2019-04-23 (×7): qty 10

## 2019-04-23 MED ORDER — AMLODIPINE BESYLATE 5 MG PO TABS
5.0000 mg | ORAL_TABLET | Freq: Every day | ORAL | Status: DC
  Administered 2019-04-23 – 2019-04-25 (×3): 5 mg via ORAL
  Filled 2019-04-23 (×3): qty 1

## 2019-04-23 MED ORDER — INFLUENZA VAC SPLIT QUAD 0.5 ML IM SUSY
0.5000 mL | PREFILLED_SYRINGE | INTRAMUSCULAR | Status: AC
  Administered 2019-04-25: 0.5 mL via INTRAMUSCULAR
  Filled 2019-04-23: qty 0.5

## 2019-04-23 MED ORDER — IPRATROPIUM-ALBUTEROL 0.5-2.5 (3) MG/3ML IN SOLN
3.0000 mL | Freq: Four times a day (QID) | RESPIRATORY_TRACT | Status: DC
  Administered 2019-04-23: 3 mL via RESPIRATORY_TRACT
  Filled 2019-04-23: qty 3

## 2019-04-23 MED ORDER — HYDROCODONE-ACETAMINOPHEN 5-325 MG PO TABS
2.0000 | ORAL_TABLET | Freq: Four times a day (QID) | ORAL | Status: DC | PRN
  Administered 2019-04-23 – 2019-04-25 (×6): 2 via ORAL
  Filled 2019-04-23 (×6): qty 2

## 2019-04-23 MED ORDER — METHYLPREDNISOLONE SODIUM SUCC 125 MG IJ SOLR
125.0000 mg | Freq: Once | INTRAMUSCULAR | Status: AC
  Administered 2019-04-23: 125 mg via INTRAVENOUS
  Filled 2019-04-23: qty 2

## 2019-04-23 MED ORDER — ENOXAPARIN SODIUM 60 MG/0.6ML ~~LOC~~ SOLN
60.0000 mg | SUBCUTANEOUS | Status: DC
  Administered 2019-04-23 – 2019-04-24 (×2): 60 mg via SUBCUTANEOUS
  Filled 2019-04-23 (×2): qty 0.6

## 2019-04-23 MED ORDER — METHYLPREDNISOLONE SODIUM SUCC 125 MG IJ SOLR
60.0000 mg | Freq: Two times a day (BID) | INTRAMUSCULAR | Status: DC
  Administered 2019-04-23 – 2019-04-25 (×5): 60 mg via INTRAVENOUS
  Filled 2019-04-23 (×5): qty 2

## 2019-04-23 MED ORDER — BENZONATATE 100 MG PO CAPS
200.0000 mg | ORAL_CAPSULE | Freq: Three times a day (TID) | ORAL | Status: DC | PRN
  Administered 2019-04-23 – 2019-04-25 (×4): 200 mg via ORAL
  Filled 2019-04-23 (×4): qty 2

## 2019-04-23 NOTE — ED Notes (Signed)
Transport called to take patient upstairs 

## 2019-04-23 NOTE — ED Provider Notes (Signed)
[facility] DEPT Provider Note   CSN: [id number] Arrival date & time: 04/23/19  1119     History Chief Complaint  Patient presents with   Shortness of Breath    [name] is a 59 y.o. male.  Pt presents to the ED today with sob.  Pt said he's had diarrhea and has lost his taste.  He has had a periodic fever.  He had a covid swab yesterday, but does not know the results.  He became more sob today and went to UC.  They called EMS.  Pt has not used any of his inhalers today.  Pt has a hx of COPD and has had several admissions for exacerbations.  He denies any known Covid exposures.  EMS put pt on 2L oxygen via [place] for low O2 sats, but an exact number is not documented.  Pt does smoke, but said he's not been able to smoke as much as usual.        Past Medical History:  Diagnosis Date   Cardiomyopathy ([place]) 2009   per cardiac cath 12-10-2007 ef 35-40%;    last echo 02-01-2017n ef 55-60%   COPD (chronic obstructive pulmonary disease) ([place])    previously seen pulmonologist-- dr Melvyn Novas--- GOLD 0-- hx multiple exacerbation's last one 06-09-2018 CAP with ARF (10-20-2018  per pt was followed by pcp and was clear;  pt was on symbicort bid and duoneb as needed per pt had not had it for past 3 months due to no insurance,  pt stated has productive smoker's cough but no sob or difficulty breathing)   DDD (degenerative disc disease), cervical    Heel spur, left    History of nuclear stress test    epic 04-08-2015   low risk w/ small inferior wall scar of mild severity focus of reversible ischemia, ef 61%   Hypertension    10-20-2018  per pt has not had medication , norvasc, in past 3 months due to no insurance   Plantar fasciitis, left     Patient Active Problem List   Diagnosis Date Noted   Plantar fasciitis    Heel spur, left    Acquired equinus deformity of left foot    Acute respiratory failure with hypoxia ([place]) 06/10/2018   COPD with  acute exacerbation ([place]) 06/10/2018   Community acquired bilateral lower lobe pneumonia 06/10/2018   Achilles tendinitis, right leg 04/24/2018   Acute respiratory distress 12/20/2017   History of noncompliance with medical treatment 12/20/2017   Acute sinusitis, unspecified 03/15/2017   Rupture of right distal biceps tendon 03/01/2016   Pain in right lower leg 01/30/2016   Chest pain 04/26/2015   Hypokalemia 04/26/2015   COPD GOLD 0 / active smoker 04/26/2015   Pain in the chest    Fall 07/22/2014   Multiple fractures of ribs of right side 07/22/2014   Pneumothorax on right 07/22/2014   Traumatic pneumohemothorax 07/21/2014   Altered mental state 04/15/2013   Acute encephalopathy 04/15/2013   Difficulty speaking 04/15/2013   Headache(784.0) 04/15/2013   Cigarette smoker 04/15/2013   HTN (hypertension) 04/15/2013    Past Surgical History:  Procedure Laterality Date   ACHILLES TENDON REPAIR Left 2015   BICEPS TENDON REPAIR Right 2018   CARDIAC CATHETERIZATION  12-10-2007  dr al little   no evidence cad, mild to moderate LVD, ef 35-40%   GASTROC RECESSION EXTREMITY Left 10/22/2018   Procedure: GASTROC RECESSION EXTREMITY;  Surgeon: [name], DPM;  Location: [facility]  [place];  Service: Podiatry;  Laterality: Left;   HEEL SPUR RESECTION Left 10/22/2018   Procedure: HEEL SPUR RESECTION;  Surgeon: [name], DPM;  Location: [facility];  Service: Podiatry;  Laterality: Left;   HIP SURGERY Left 2005 approx.   KNEE SURGERY Bilateral x7   last one 2010  approx.   PLANTAR FASCIA RELEASE Left 10/22/2018   Procedure: ENDOSCOPIC PLANTAR FASCIOTOMY;  Surgeon: [name], DPM;  Location: [facility];  Service: Podiatry;  Laterality: Left;       Family History  Problem Relation Age of Onset   CAD Father    Bone cancer Mother     Social History   Tobacco Use   Smoking status: Current Every Day  Smoker    Packs/day: 1.00    Years: 28.00    Pack years: 28.00    Types: Cigarettes   Smokeless tobacco: Never Used  Substance Use Topics   Alcohol use: Yes    Comment: on occasion   Drug use: No    Home Medications Prior to Admission medications   Medication Sig Start Date End Date Taking? Authorizing Provider  albuterol (VENTOLIN HFA) 108 (90 Base) MCG/ACT inhaler TWO PUFFS INHALED EVERY 4 TO 6 HOURS AS NEEDED FOR COUGH, WHEEZE, SHORTNESS OF BREATH 10/21/18   [provider]  amLODipine (NORVASC) 5 MG tablet Take 1 tablet (5 mg total) by mouth daily for 30 days. Patient not taking: Reported on 10/20/2018 06/14/18 07/14/18  Kayleen Memos, DO  ciprofloxacin (CIPRO) 500 MG tablet Take 1 tablet (500 mg total) by mouth 2 (two) times daily. 02/12/19   [name], DPM  gabapentin (NEURONTIN) 300 MG capsule TAKE 1 CAPSULE BY MOUTH EVERYDAY AT BEDTIME 01/09/19   [name], DPM  gabapentin (NEURONTIN) 300 MG capsule TAKE 1 CAPSULE BY MOUTH THREE TIMES A DAY 02/23/19   [name], DPM  HYDROcodone-acetaminophen (NORCO) 5-325 MG tablet Take 1 tablet by mouth every 6 (six) hours as needed for moderate pain. 03/02/19   [name], DPM  methylPREDNISolone (MEDROL DOSEPAK) 4 MG TBPK tablet 6 Day Taper Pack. Take as Directed. 12/11/18   [name], DPM  oxyCODONE-acetaminophen (PERCOCET) 5-325 MG tablet Take 1 tablet by mouth every 4 (four) hours as needed for severe pain. 02/12/19   [name], DPM  sildenafil (REVATIO) 20 MG tablet TAKE 1 TO 2 TABLETS BY MOUTH ONE HOUR PRIOR TO SEXUAL ACTIVITY 10/31/18   [provider]  SYMBICORT 160-4.5 MCG/ACT inhaler TWO PUFFS INHALED TWICE A DAY 10/21/18   [provider]  traMADol (ULTRAM) 50 MG tablet Take 1 tablet (50 mg total) by mouth every 8 (eight) hours as needed. 04/02/19   [name], DPM  zolpidem (AMBIEN) 5 MG tablet Take 1 tablet (5 mg total) by mouth at bedtime as needed for sleep. 04/02/19    [name], DPM    Allergies    Patient has no known allergies.  Review of Systems   Review of Systems  Constitutional: Positive for fever.  Respiratory: Positive for cough, shortness of breath and wheezing.   Gastrointestinal: Positive for diarrhea.  All other systems reviewed and are negative.   Physical Exam Updated Vital Signs BP (!) 164/88    Pulse 64    Temp 98.6 F (37 C) (Oral)    Resp 14    SpO2 100%   Physical Exam Vitals and nursing note reviewed.  Constitutional:  Appearance: He is well-developed.  HENT:     Head: Normocephalic and atraumatic.     Mouth/Throat:     Mouth: Mucous membranes are moist.     Pharynx: Oropharynx is clear.  Eyes:     Extraocular Movements: Extraocular movements intact.     Pupils: Pupils are equal, round, and reactive to light.  Cardiovascular:     Rate and Rhythm: Normal rate and regular rhythm.  Pulmonary:     Effort: Tachypnea present.     Breath sounds: Wheezing present.  Abdominal:     General: Bowel sounds are normal.     Palpations: Abdomen is soft.  Musculoskeletal:        General: Normal range of motion.     Cervical back: Normal range of motion and neck supple.  Skin:    General: Skin is warm.     Capillary Refill: Capillary refill takes less than 2 seconds.  Neurological:     General: No focal deficit present.     Mental Status: He is alert and oriented to person, place, and time.  Psychiatric:        Mood and Affect: Mood normal.        Behavior: Behavior normal.     ED Results / Procedures / Treatments   Labs (all labs ordered are listed, but only abnormal results are displayed) Labs Reviewed  BASIC METABOLIC PANEL - Abnormal; Notable for the following components:      Result Value   Glucose, Bld 109 (*)    All other components within normal limits  RESPIRATORY PANEL BY RT PCR (FLU A&B, COVID)  BRAIN NATRIURETIC PEPTIDE  MAGNESIUM  LACTIC ACID, PLASMA  LACTIC ACID, PLASMA  CBC WITH  DIFFERENTIAL/PLATELET    EKG EKG Interpretation  Date/Time:  Thursday April 23 2019 11:57:50 EST Ventricular Rate:  61 PR Interval:    QRS Duration: 86 QT Interval:  430 QTC Calculation: 434 R Axis:   44 Text Interpretation: Sinus rhythm No significant change since last tracing Confirmed by Isla Pence [phone]) on 04/23/2019 12:49:55 PM   Radiology DG Chest Port 1 View  Result Date: 04/23/2019 CLINICAL DATA:  Shortness of breath and COVID like symptoms EXAM: PORTABLE CHEST 1 VIEW COMPARISON:  02/03/2019 FINDINGS: Cardiac shadow is mildly enlarged but accentuated by the portable technique. Linear scarring is again noted in the right mid lung stable from the prior study. No focal infiltrate or sizable effusion is seen. No bony abnormality is noted. IMPRESSION: No acute abnormality seen. Stable scarring is noted in the right mid lung. Electronically Signed   By: Inez Catalina M.D.   On: 04/23/2019 12:30    Procedures Procedures (including critical care time)  Medications Ordered in ED Medications  morphine 4 MG/ML injection 4 mg (has no administration in time range)  ondansetron (ZOFRAN) injection 4 mg (has no administration in time range)  methylPREDNISolone sodium succinate (SOLU-MEDROL) 125 mg/2 mL injection 125 mg (125 mg Intravenous Given 04/23/19 1154)  albuterol (VENTOLIN HFA) 108 (90 Base) MCG/ACT inhaler 4 puff (4 puffs Inhalation Given 04/23/19 1154)  AeroChamber Plus Flo-Vu Medium MISC 1 each (1 each Other Given 04/23/19 1154)  albuterol (PROVENTIL,VENTOLIN) solution continuous neb (10 mg/hr Nebulization Given 04/23/19 1356)    ED Course  I have reviewed the triage vital signs and the nursing notes.  Pertinent labs & imaging results that were available during my care of the patient were reviewed by me and considered in my medical decision making (see chart for details).  MDM Rules/Calculators/A&P                     Covid is negative.  Pt remains still very sob  after albuterol inhaler and solumedrol.  Continuous neb ordered.  Pt's O2 sats are in the mid-90s on 2L oxygen.    Pt d/w Dr. Tawanna Solo ([facility]) who will admit.  CRITICAL CARE Performed by: Isla Pence   Total critical care time: 30  minutes  Critical care time was exclusive of separately billable procedures and treating other patients.  Critical care was necessary to treat or prevent imminent or life-threatening deterioration.  Critical care was time spent personally by me on the following activities: development of treatment plan with patient and/or surrogate as well as nursing, discussions with consultants, evaluation of patient's response to treatment, examination of patient, obtaining history from patient or surrogate, ordering and performing treatments and interventions, ordering and review of laboratory studies, ordering and review of radiographic studies, pulse oximetry and re-evaluation of patient's condition.  [name] was evaluated in Emergency Department on 04/23/2019 for the symptoms described in the history of present illness. He was evaluated in the context of the global COVID-19 pandemic, which necessitated consideration that the patient might be at risk for infection with the SARS-CoV-2 virus that causes COVID-19. Institutional protocols and algorithms that pertain to the evaluation of patients at risk for COVID-19 are in a state of rapid change based on information released by regulatory bodies including the CDC and federal and state organizations. These policies and algorithms were followed during the patient's care in the ED.   Final Clinical Impression(s) / ED Diagnoses Final diagnoses:  COPD exacerbation ([place])  COVID-19 virus not detected  Tobacco abuse    Rx / DC Orders ED Discharge Orders    None       Isla Pence, MD 04/23/19 1410

## 2019-04-23 NOTE — H&P (Signed)
History and Physical    [name] [id number] DOB: 1961-03-15 DOA: 04/23/2019  PCP: [name], MD   Patient coming from: Home    Chief Complaint: Shortness of breath  HPI: [name] is a 59 y.o. male with medical history significant of COPD, current smoker, peripheral neuropathy, hypertension who presents from home today to the emergency department complains of shortness of breath.  Patient started having severe shortness of breath about 4 to 5 days ago.  He also started wheezing.  Having dry cough.  Has been diagnosed with COPD, not on home oxygen.  He says he has significantly cut down his smoking and smokes 4 to 5 cigarettes a day.  He had several exacerbations in the past.  He has also noticed vague abdominal discomfort with watery diarrhea since more than a month and her primary care physician is recommending colonoscopy as an outpatient. Patient seen and examined at the bedside in the emergency department.  He was still wheezing during my evaluation.  He had to be put on 2 L of oxygen per minute because he was hypoxic on room air.  He denies any Covid exposures.  He denies any chest pain, nausea, vomiting, diarrhea, fever or chills.  ED Course: Chest x-ray did not show any pneumonia.  Started on bronchodilators, steroids.  Covid screening test came out to be negative.AbG is pending.  Review of Systems: As per HPI otherwise 10 point review of systems negative.    Past Medical History:  Diagnosis Date  . Cardiomyopathy ([place]) 2009   per cardiac cath 12-10-2007 ef 35-40%;    last echo 02-01-2017n ef 55-60%  . COPD (chronic obstructive pulmonary disease) ([place])    previously seen pulmonologist-- dr Melvyn Novas--- GOLD 0-- hx multiple exacerbation's last one 06-09-2018 CAP with ARF (10-20-2018  per pt was followed by pcp and was clear;  pt was on symbicort bid and duoneb as needed per pt had not had it for past 3 months due to no insurance,  pt stated has  productive smoker's cough but no sob or difficulty breathing)  . DDD (degenerative disc disease), cervical   . Heel spur, left   . History of nuclear stress test    epic 04-08-2015   low risk w/ small inferior wall scar of mild severity focus of reversible ischemia, ef 61%  . Hypertension    10-20-2018  per pt has not had medication , norvasc, in past 3 months due to no insurance  . Plantar fasciitis, left     Past Surgical History:  Procedure Laterality Date  . ACHILLES TENDON REPAIR Left 2015  . BICEPS TENDON REPAIR Right 2018  . CARDIAC CATHETERIZATION  12-10-2007  dr al little   no evidence cad, mild to moderate LVD, ef 35-40%  . GASTROC RECESSION EXTREMITY Left 10/22/2018   Procedure: GASTROC RECESSION EXTREMITY;  Surgeon: [name], DPM;  Location: [facility];  Service: Podiatry;  Laterality: Left;  . HEEL SPUR RESECTION Left 10/22/2018   Procedure: HEEL SPUR RESECTION;  Surgeon: [name], DPM;  Location: [place];  Service: Podiatry;  Laterality: Left;  . HIP SURGERY Left 2005 approx.  Marland Kitchen KNEE SURGERY Bilateral x7   last one 2010  approx.  Marland Kitchen PLANTAR FASCIA RELEASE Left 10/22/2018   Procedure: ENDOSCOPIC PLANTAR FASCIOTOMY;  Surgeon: [name], DPM;  Location: [place];  Service: Podiatry;  Laterality: Left;     reports that he has been smoking  cigarettes. He has a 28.00 pack-year smoking history. He has never used smokeless tobacco. He reports current alcohol use. He reports that he does not use drugs.  No Known Allergies  Family History  Problem Relation Age of Onset  . CAD Father   . Bone cancer Mother      Prior to Admission medications   Medication Sig Start Date End Date Taking? Authorizing Provider  albuterol (VENTOLIN HFA) 108 (90 Base) MCG/ACT inhaler TWO PUFFS INHALED EVERY 4 TO 6 HOURS AS NEEDED FOR COUGH, WHEEZE, SHORTNESS OF BREATH 10/21/18   [provider]  amLODipine (NORVASC) 5  MG tablet Take 1 tablet (5 mg total) by mouth daily for 30 days. Patient not taking: Reported on 10/20/2018 06/14/18 07/14/18  Kayleen Memos, DO  ciprofloxacin (CIPRO) 500 MG tablet Take 1 tablet (500 mg total) by mouth 2 (two) times daily. 02/12/19   [name], DPM  gabapentin (NEURONTIN) 300 MG capsule TAKE 1 CAPSULE BY MOUTH EVERYDAY AT BEDTIME 01/09/19   [name], DPM  gabapentin (NEURONTIN) 300 MG capsule TAKE 1 CAPSULE BY MOUTH THREE TIMES A DAY 02/23/19   [name], DPM  HYDROcodone-acetaminophen (NORCO) 5-325 MG tablet Take 1 tablet by mouth every 6 (six) hours as needed for moderate pain. 03/02/19   [name], DPM  methylPREDNISolone (MEDROL DOSEPAK) 4 MG TBPK tablet 6 Day Taper Pack. Take as Directed. 12/11/18   [name], DPM  oxyCODONE-acetaminophen (PERCOCET) 5-325 MG tablet Take 1 tablet by mouth every 4 (four) hours as needed for severe pain. 02/12/19   [name], DPM  sildenafil (REVATIO) 20 MG tablet TAKE 1 TO 2 TABLETS BY MOUTH ONE HOUR PRIOR TO SEXUAL ACTIVITY 10/31/18   [provider]  SYMBICORT 160-4.5 MCG/ACT inhaler TWO PUFFS INHALED TWICE A DAY 10/21/18   [provider]  traMADol (ULTRAM) 50 MG tablet Take 1 tablet (50 mg total) by mouth every 8 (eight) hours as needed. 04/02/19   [name], DPM  zolpidem (AMBIEN) 5 MG tablet Take 1 tablet (5 mg total) by mouth at bedtime as needed for sleep. 04/02/19   [name], DPM    Physical Exam: Vitals:   04/23/19 1153  BP: (!) 164/88  Pulse: 64  Resp: 14  Temp: 98.6 F (37 C)  TempSrc: Oral  SpO2: 100%    Constitutional: In mild to moderate respiratory distress Vitals:   04/23/19 1153  BP: (!) 164/88  Pulse: 64  Resp: 14  Temp: 98.6 F (37 C)  TempSrc: Oral  SpO2: 100%   ENMT: Mucous membranes are moist.  Neck: normal, supple, no masses, no thyromegaly Respiratory: Bilateral expiratory wheezes, normal respiratory effort. No accessory muscle use.    Cardiovascular: Regular rate and rhythm, no murmurs / rubs / gallops. No extremity edema. Abdomen: no tenderness, no masses palpated. No hepatosplenomegaly. Bowel sounds positive.  Musculoskeletal: no clubbing / cyanosis. No joint deformity upper and lower extremities. Good ROM, no contractures. Normal muscle tone.  Skin: no rashes, lesions, ulcers. No induration Neurologic: CN 2-12 grossly intact.  Strength 5/5 in all 4.  Psychiatric: Normal judgment and insight. Alert and oriented x 3. Normal mood.   Foley Catheter:None  Labs on Admission: I have personally reviewed following labs and imaging studies  CBC: No results for input(s): WBC, NEUTROABS, HGB, HCT, MCV, PLT in the last 168 hours. Basic Metabolic Panel: Recent Labs  Lab 04/23/19 1134  NA 140  K 4.1  CL 111  CO2 24  GLUCOSE 109*  BUN 9  CREATININE 0.95  CALCIUM 9.2  MG 2.2   GFR: CrCl cannot be calculated (Unknown ideal weight.). Liver Function Tests: No results for input(s): AST, ALT, ALKPHOS, BILITOT, PROT, ALBUMIN in the last 168 hours. No results for input(s): LIPASE, AMYLASE in the last 168 hours. No results for input(s): AMMONIA in the last 168 hours. Coagulation Profile: No results for input(s): INR, PROTIME in the last 168 hours. Cardiac Enzymes: No results for input(s): CKTOTAL, CKMB, CKMBINDEX, TROPONINI in the last 168 hours. BNP (last 3 results) No results for input(s): PROBNP in the last 8760 hours. HbA1C: No results for input(s): HGBA1C in the last 72 hours. CBG: No results for input(s): GLUCAP in the last 168 hours. Lipid Profile: No results for input(s): CHOL, HDL, LDLCALC, TRIG, CHOLHDL, LDLDIRECT in the last 72 hours. Thyroid Function Tests: No results for input(s): TSH, T4TOTAL, FREET4, T3FREE, THYROIDAB in the last 72 hours. Anemia Panel: No results for input(s): VITAMINB12, FOLATE, FERRITIN, TIBC, IRON, RETICCTPCT in the last 72 hours. Urine analysis:    Component Value Date/Time    COLORURINE COLORLESS (A) 11/05/2016 2314   APPEARANCEUR CLEAR 11/05/2016 2314   LABSPEC 1.000 (L) 11/05/2016 2314   PHURINE 7.0 11/05/2016 2314   GLUCOSEU NEGATIVE 11/05/2016 2314   HGBUR NEGATIVE 11/05/2016 2314   BILIRUBINUR NEGATIVE 11/05/2016 2314   KETONESUR NEGATIVE 11/05/2016 2314   PROTEINUR NEGATIVE 11/05/2016 2314   UROBILINOGEN 1.0 07/21/2014 1514   NITRITE NEGATIVE 11/05/2016 2314   LEUKOCYTESUR NEGATIVE 11/05/2016 2314    Radiological Exams on Admission: DG Chest Port 1 View  Result Date: 04/23/2019 CLINICAL DATA:  Shortness of breath and COVID like symptoms EXAM: PORTABLE CHEST 1 VIEW COMPARISON:  02/03/2019 FINDINGS: Cardiac shadow is mildly enlarged but accentuated by the portable technique. Linear scarring is again noted in the right mid lung stable from the prior study. No focal infiltrate or sizable effusion is seen. No bony abnormality is noted. IMPRESSION: No acute abnormality seen. Stable scarring is noted in the right mid lung. Electronically Signed   By: Inez Catalina M.D.   On: 04/23/2019 12:30     Assessment/Plan Principal Problem:   Acute respiratory failure with hypoxia (HCC) Active Problems:   Cigarette smoker   HTN (hypertension)   COPD with acute exacerbation (HCC)   Acute hypoxic respiratory failure: Secondary to COPD exacerbation.  Needed supplemental oxygen for maintenance of saturation.  Not on oxygen at home.  Will check ABG.  Currently on 2 L/min.  Covid screening test negative  COPD exacerbation: Has history of COPD and still smoking.    Uses inhalers at home.  Started on bronchodilators, nebulization treatment.  Chest x-ray did not show any pneumonia.  Complains of dry cough.  Smoker: Smokes 4 to 5 cigarettes a day.  Counseled for cessation.  History of peripheral neuropathy: On gabapentin  Hypertension: Reported that he was not taking any medicines recently.  Taking amlodipine in the past.  Hypertensive in the emergency department.  Resumed  amlodipine  Abdominal discomfort/diarrhea: Reports abdominal discomfort and diarrhea since 1 to 2 months.  Has been following with PCP and being planned for colonoscopy as an outpatient.  Will check GI pathogen panel.  Abdomen is soft and nontender on examination.   Severity of Illness: The appropriate patient status for this patient is OBSERVATION.      DVT prophylaxis: Lovenox Code Status: Full Family Communication: None present at the bedside Consults called: None     [name]  Victor Schimming MD [facility] Pager [phone]  If 7PM-7AM, please contact night-coverage [email or web address] Password [email or web address]  04/23/2019, 2:15 PM

## 2019-04-23 NOTE — ED Triage Notes (Signed)
Transported by [facility] from [facility]-- has been experiencing COVID symptoms x 4 days +fever, shob, loss of taste and smell. Hx of HTN   BP 160/130 72 HR 20RR 96% on 2 L of O2

## 2019-04-24 DIAGNOSIS — I428 Other cardiomyopathies: Secondary | ICD-10-CM | POA: Diagnosis present

## 2019-04-24 DIAGNOSIS — Z8249 Family history of ischemic heart disease and other diseases of the circulatory system: Secondary | ICD-10-CM | POA: Diagnosis not present

## 2019-04-24 DIAGNOSIS — M5030 Other cervical disc degeneration, unspecified cervical region: Secondary | ICD-10-CM | POA: Diagnosis present

## 2019-04-24 DIAGNOSIS — Z7951 Long term (current) use of inhaled steroids: Secondary | ICD-10-CM | POA: Diagnosis not present

## 2019-04-24 DIAGNOSIS — R0602 Shortness of breath: Secondary | ICD-10-CM | POA: Diagnosis present

## 2019-04-24 DIAGNOSIS — J9601 Acute respiratory failure with hypoxia: Secondary | ICD-10-CM | POA: Diagnosis present

## 2019-04-24 DIAGNOSIS — I10 Essential (primary) hypertension: Secondary | ICD-10-CM | POA: Diagnosis present

## 2019-04-24 DIAGNOSIS — Z23 Encounter for immunization: Secondary | ICD-10-CM | POA: Diagnosis not present

## 2019-04-24 DIAGNOSIS — G629 Polyneuropathy, unspecified: Secondary | ICD-10-CM | POA: Diagnosis present

## 2019-04-24 DIAGNOSIS — R197 Diarrhea, unspecified: Secondary | ICD-10-CM | POA: Diagnosis present

## 2019-04-24 DIAGNOSIS — Z809 Family history of malignant neoplasm, unspecified: Secondary | ICD-10-CM | POA: Diagnosis not present

## 2019-04-24 DIAGNOSIS — J449 Chronic obstructive pulmonary disease, unspecified: Secondary | ICD-10-CM | POA: Diagnosis present

## 2019-04-24 DIAGNOSIS — J441 Chronic obstructive pulmonary disease with (acute) exacerbation: Secondary | ICD-10-CM | POA: Diagnosis present

## 2019-04-24 DIAGNOSIS — F17210 Nicotine dependence, cigarettes, uncomplicated: Secondary | ICD-10-CM | POA: Diagnosis present

## 2019-04-24 DIAGNOSIS — Z20822 Contact with and (suspected) exposure to covid-19: Secondary | ICD-10-CM | POA: Diagnosis present

## 2019-04-24 LAB — BASIC METABOLIC PANEL
Anion gap: 7 (ref 5–15)
BUN: 15 mg/dL (ref 6–20)
CO2: 23 mmol/L (ref 22–32)
Calcium: 9.1 mg/dL (ref 8.9–10.3)
Chloride: 107 mmol/L (ref 98–111)
Creatinine, Ser: 0.83 mg/dL (ref 0.61–1.24)
GFR calc Af Amer: >60 mL/min (ref >60)
GFR calc non Af Amer: >60 mL/min (ref >60)
Glucose, Bld: 153 mg/dL — ABNORMAL HIGH (ref 70–99)
Potassium: 4.5 mmol/L (ref 3.5–5.1)
Sodium: 137 mmol/L (ref 135–145)

## 2019-04-24 LAB — CBC
HCT: 46.4 % (ref 39.0–52.0)
Hemoglobin: 14.8 g/dL (ref 13.0–17.0)
MCH: 29.5 pg (ref 26.0–34.0)
MCHC: 31.9 g/dL (ref 30.0–36.0)
MCV: 92.6 fL (ref 80.0–100.0)
Platelets: 225 10*3/uL (ref 150–400)
RBC: 5.01 MIL/uL (ref 4.22–5.81)
RDW: 14.1 % (ref 11.5–15.5)
WBC: 14.1 10*3/uL — ABNORMAL HIGH (ref 4.0–10.5)
nRBC: 0 % (ref 0.0–0.2)

## 2019-04-24 MED ORDER — IPRATROPIUM-ALBUTEROL 0.5-2.5 (3) MG/3ML IN SOLN
3.0000 mL | Freq: Three times a day (TID) | RESPIRATORY_TRACT | Status: DC
  Administered 2019-04-24 – 2019-04-25 (×4): 3 mL via RESPIRATORY_TRACT
  Filled 2019-04-24 (×4): qty 3

## 2019-04-24 MED ORDER — MORPHINE SULFATE (PF) 4 MG/ML IV SOLN
4.0000 mg | Freq: Once | INTRAVENOUS | Status: AC
  Administered 2019-04-24: 4 mg via INTRAVENOUS
  Filled 2019-04-24: qty 1

## 2019-04-24 MED ORDER — AZITHROMYCIN 250 MG PO TABS
500.0000 mg | ORAL_TABLET | Freq: Every day | ORAL | Status: DC
  Administered 2019-04-24 – 2019-04-25 (×2): 500 mg via ORAL
  Filled 2019-04-24 (×2): qty 2

## 2019-04-24 MED ORDER — ALBUTEROL SULFATE (2.5 MG/3ML) 0.083% IN NEBU: 2.5000 mg | INHALATION_SOLUTION | RESPIRATORY_TRACT | Status: DC | PRN

## 2019-04-24 NOTE — Progress Notes (Signed)
Hospitalist progress note   Patient from home, Patient going likely to home, Dispo not clear at this time needs clinical improvement  Victor DOUCETTE [id number] DOB: 12-19-1960 DOA: 04/23/2019  PCP: [name], MD   Narrative:  59 year old male COPD not on oxygen, HTN,?  Prior cardiomyopathy 2009-MI ruled out 04/27/2015 and EF improved 55-60% - stress test, admission 06/14/2018 community-acquired pneumonia-came to emergency room shortness of breath periodic fever diarrhea initial concern for Covid Covid negative-felt to be secondary to COPD exacerbation   Data Reviewed:  BUN/creatinine 15/0.8 WBC 14 up from 6.7 but on steroids Portable CXR 1/28 no acute abnormality scarring  Assessment & Plan:  COPD exacerbation Continue Solu-Medrol every 12 hours 60 mg, inhalers with AeroChamber albuterol, DuoNeb, Dulera Start azithromycin to clear sputum Still coughing quite significantly and has not ambulated so we will ask that he ambulates and monitor trends-we will need to see him in the hospital for 1 more day prior to making decisions about further therapy Leukocytosis Likely secondary to stress demargination from steroids-monitor trends Diarrhea Not characterized as being diarrhea today he states that he had 1 month of diarrhea went to PCP and was told he may need to get a colonoscopy which he has never had before Paradoxically his diarrhea is improved while being on steroids and since coming to the hospital this raises a question of possible microscopic colitis-we will monitor trends once we transition him to prednisone and see how he does HTN Continue amlodipine 5 monitor trends Prior nonischemic cardiomyopathy with improvement Negative stress test 2017 Monitor trends Peripheral neuropathy Unclear etiology continue gabapentin 300 daily   Subjective: Mainly coughing however not using oxygen Feels overall somewhat better than prior No chest pain No fever No  chills Consultants:   None as yet  Objective: Vitals:   04/23/19 1700 04/23/19 2019 04/23/19 2025 04/24/19 0508  BP:   137/77 136/89  Pulse:   68 66  Resp:   20 16  Temp:   97.9 F (36.6 C) 97.8 F (36.6 C)  TempSrc:   Oral Oral  SpO2:  95% 95% 93%  Weight: 118.8 kg     Height: 6\' 2"  (1.88 m)      No intake or output data in the 24 hours ending 04/24/19 0733 Filed Weights   04/23/19 1700  Weight: 118.8 kg    Examination: Awake alert coherent no distress EOMI NCAT thick neck no pallor no icterus Mallampati 3 S1-S2 no murmur some wheeze Abdomen soft no rebound no guarding Neurologically intact with cough for limbs equally Obese abdomen no rebound scar and right upper quadrant  Scheduled Meds: . amLODipine  5 mg Oral Daily  . enoxaparin (LOVENOX) injection  60 mg Subcutaneous Q24H  . gabapentin  300 mg Oral TID  . guaiFENesin-dextromethorphan  10 mL Oral Q6H  . influenza vac split quadrivalent PF  0.5 mL Intramuscular Tomorrow-1000  . ipratropium-albuterol  3 mL Nebulization TID  . methylPREDNISolone (SOLU-MEDROL) injection  60 mg Intravenous Q12H  . mometasone-formoterol  2 puff Inhalation BID   Continuous Infusions:   LOS: 0 days   Time spent: Raymond, MD [facility]  04/24/2019, 7:33 AM

## 2019-04-25 LAB — BASIC METABOLIC PANEL
Anion gap: 7 (ref 5–15)
BUN: 15 mg/dL (ref 6–20)
CO2: 22 mmol/L (ref 22–32)
Calcium: 9 mg/dL (ref 8.9–10.3)
Chloride: 110 mmol/L (ref 98–111)
Creatinine, Ser: 0.8 mg/dL (ref 0.61–1.24)
GFR calc Af Amer: >60 mL/min (ref >60)
GFR calc non Af Amer: >60 mL/min (ref >60)
Glucose, Bld: 129 mg/dL — ABNORMAL HIGH (ref 70–99)
Potassium: 4.6 mmol/L (ref 3.5–5.1)
Sodium: 139 mmol/L (ref 135–145)

## 2019-04-25 LAB — CBC WITH DIFFERENTIAL/PLATELET
Abs Immature Granulocytes: 0.11 10*3/uL — ABNORMAL HIGH (ref 0.00–0.07)
Basophils Absolute: 0 10*3/uL (ref 0.0–0.1)
Basophils Relative: 0 %
Eosinophils Absolute: 0 10*3/uL (ref 0.0–0.5)
Eosinophils Relative: 0 %
HCT: 46.3 % (ref 39.0–52.0)
Hemoglobin: 14.8 g/dL (ref 13.0–17.0)
Immature Granulocytes: 1 %
Lymphocytes Relative: 8 %
Lymphs Abs: 1.4 10*3/uL (ref 0.7–4.0)
MCH: 29.8 pg (ref 26.0–34.0)
MCHC: 32 g/dL (ref 30.0–36.0)
MCV: 93.2 fL (ref 80.0–100.0)
Monocytes Absolute: 1.1 10*3/uL — ABNORMAL HIGH (ref 0.1–1.0)
Monocytes Relative: 6 %
Neutro Abs: 14.6 10*3/uL — ABNORMAL HIGH (ref 1.7–7.7)
Neutrophils Relative %: 85 %
Platelets: 225 10*3/uL (ref 150–400)
RBC: 4.97 MIL/uL (ref 4.22–5.81)
RDW: 14.3 % (ref 11.5–15.5)
WBC: 17.2 10*3/uL — ABNORMAL HIGH (ref 4.0–10.5)
nRBC: 0 % (ref 0.0–0.2)

## 2019-04-25 MED ORDER — IPRATROPIUM-ALBUTEROL 0.5-2.5 (3) MG/3ML IN SOLN: 3.0000 mL | Freq: Three times a day (TID) | RESPIRATORY_TRACT | 1 refills | Status: AC

## 2019-04-25 MED ORDER — TRAMADOL HCL 50 MG PO TABS: 50.0000 mg | ORAL_TABLET | Freq: Three times a day (TID) | ORAL | 0 refills | Status: DC | PRN

## 2019-04-25 MED ORDER — BENZONATATE 200 MG PO CAPS: 200.0000 mg | ORAL_CAPSULE | Freq: Three times a day (TID) | ORAL | 0 refills | Status: DC | PRN

## 2019-04-25 MED ORDER — NICOTINE 14 MG/24HR TD PT24: 14.0000 mg | MEDICATED_PATCH | Freq: Every day | TRANSDERMAL | 0 refills | Status: DC

## 2019-04-25 MED ORDER — CEPASTAT 14.5 MG MT LOZG
1.0000 | LOZENGE | OROMUCOSAL | Status: DC | PRN
  Filled 2019-04-25: qty 9

## 2019-04-25 MED ORDER — ALBUTEROL SULFATE HFA 108 (90 BASE) MCG/ACT IN AERS: 2.0000 | INHALATION_SPRAY | RESPIRATORY_TRACT | 2 refills | Status: AC | PRN

## 2019-04-25 MED ORDER — PREDNISONE 20 MG PO TABS: 60.0000 mg | ORAL_TABLET | Freq: Every day | ORAL | 0 refills | Status: DC

## 2019-04-25 MED ORDER — PREDNISONE 50 MG PO TABS
60.0000 mg | ORAL_TABLET | Freq: Every day | ORAL | Status: DC
  Administered 2019-04-25: 60 mg via ORAL
  Filled 2019-04-25: qty 1

## 2019-04-25 MED ORDER — CHANTIX STARTING MONTH PAK 0.5 MG X 11 & 1 MG X 42 PO TABS: ORAL_TABLET | ORAL | 0 refills | Status: DC

## 2019-04-25 MED ORDER — MENTHOL 3 MG MT LOZG
1.0000 | LOZENGE | OROMUCOSAL | Status: DC | PRN
  Administered 2019-04-25: 3 mg via ORAL
  Filled 2019-04-25: qty 9

## 2019-04-25 MED ORDER — SYMBICORT 160-4.5 MCG/ACT IN AERO: 2.0000 | INHALATION_SPRAY | Freq: Two times a day (BID) | RESPIRATORY_TRACT | 12 refills | Status: DC

## 2019-04-25 MED ORDER — AZITHROMYCIN 250 MG PO TABS: ORAL_TABLET | ORAL | 0 refills | Status: DC

## 2019-04-25 NOTE — Progress Notes (Signed)
Patient ambulated in the hallway with continuous pulse ox saturation at 98 beginning. Dropped to 90 after 100 feet. No dizziness or SOB, tolerated well. Anxious to go home. Notified primary nurse.

## 2019-04-25 NOTE — Progress Notes (Signed)
Nsg Discharge Note  Admit Date:  04/23/2019 Discharge date: 04/25/2019   Ashaz W Reza to be D/C'd Home per MD order.  AVS completed.  Copy for chart, and copy for patient signed, and dated. Patient/caregiver able to verbalize understanding.  Discharge Medication: Allergies as of 04/25/2019   No Known Allergies     Medication List    STOP taking these medications   ciprofloxacin 500 MG tablet Commonly known as: Cipro   HYDROcodone-acetaminophen 5-325 MG tablet Commonly known as: Norco   methylPREDNISolone 4 MG Tbpk tablet Commonly known as: MEDROL DOSEPAK   oxyCODONE-acetaminophen 5-325 MG tablet Commonly known as: Percocet     TAKE these medications   albuterol 108 (90 Base) MCG/ACT inhaler Commonly known as: VENTOLIN HFA Inhale 2 puffs into the lungs every 4 (four) hours as needed for wheezing or shortness of breath.   amLODipine 5 MG tablet Commonly known as: NORVASC Take 1 tablet (5 mg total) by mouth daily for 30 days.   azithromycin 250 MG tablet Commonly known as: ZITHROMAX Complete treatment   benzonatate 200 MG capsule Commonly known as: TESSALON Take 1 capsule (200 mg total) by mouth 3 (three) times daily as needed for cough.   Chantix Starting Month Pak 0.5 MG X 11 & 1 MG X 42 tablet Generic drug: varenicline Take one 0.5 mg tablet by mouth once daily for 3 days, then increase to one 0.5 mg tablet twice daily for 4 days, then increase to one 1 mg tablet twice daily.   gabapentin 300 MG capsule Commonly known as: NEURONTIN TAKE 1 CAPSULE BY MOUTH THREE TIMES A DAY What changed:   See the new instructions.  Another medication with the same name was removed. Continue taking this medication, and follow the directions you see here.   ipratropium-albuterol 0.5-2.5 (3) MG/3ML Soln Commonly known as: DUONEB Take 3 mLs by nebulization 3 (three) times daily.   nicotine 14 mg/24hr patch Commonly known as: NICODERM CQ - dosed in mg/24 hours Place 1  patch (14 mg total) onto the skin daily.   predniSONE 20 MG tablet Commonly known as: DELTASONE Take 3 tablets (60 mg total) by mouth daily before breakfast. Start taking on: April 26, 2019   sildenafil 20 MG tablet Commonly known as: REVATIO Take 20-40 mg by mouth as needed (before sexual activity).   Symbicort 160-4.5 MCG/ACT inhaler Generic drug: budesonide-formoterol Inhale 2 puffs into the lungs 2 (two) times daily.   traMADol 50 MG tablet Commonly known as: ULTRAM Take 1 tablet (50 mg total) by mouth every 8 (eight) hours as needed for up to 3 days for moderate pain.   zolpidem 5 MG tablet Commonly known as: AMBIEN Take 1 tablet (5 mg total) by mouth at bedtime as needed for sleep.       Discharge Assessment: Vitals:   04/25/19 0707 04/25/19 1000  BP:  (!) 143/76  Pulse: 88 72  Resp: 16 16  Temp:  (!) 97.4 F (36.3 C)  SpO2: 96% 97%   Skin clean, dry and intact without evidence of skin break down, no evidence of skin tears noted. IV catheter discontinued intact. Site without signs and symptoms of complications - no redness or edema noted at insertion site, patient denies c/o pain - only slight tenderness at site.  Dressing with slight pressure applied.  D/c Instructions-Education: Discharge instructions given to patient/family with verbalized understanding. D/c education completed with patient/family including follow up instructions, medication list, d/c activities limitations if indicated, with other d/c  instructions as indicated by MD - patient able to verbalize understanding, all questions fully answered. Patient instructed to return to ED, call 911, or call MD for any changes in condition.  Patient ambulated to front of building to await transport via personal vehicle.   Eustace Pen, RN 04/25/2019 11:19 AM

## 2019-04-25 NOTE — Discharge Summary (Signed)
Physician Discharge Summary  [name] [id number] DOB: [birth year] DOA: 04/23/2019  PCP: [name], MD  Admit date: 04/23/2019 Discharge date: 04/25/2019  Time spent: 35  Recommendations for Outpatient Follow-up:  1. Patient needs continued cessation counseling-start Chantix nicotine patch this admission counseled to pick a stop date 2. Given burst of azithromycin prednisone on discharge 3. Needs Chem-12, CBC 1 week 4. Needs outpatient guided weight loss 5. Probably needs colonoscopy biopsy to confirm diagnosis of suspected microscopic colitis  Discharge Diagnoses:  Principal Problem:   Acute respiratory failure with hypoxia (HCC) Active Problems:   Cigarette smoker   HTN (hypertension)   COPD with acute exacerbation (HCC)   COPD (chronic obstructive pulmonary disease) ([place])   Discharge Condition: Improved  Diet recommendation: Regular  Filed Weights   04/23/19 1700  Weight: 118.8 kg    History of present illness:  59 year old male COPD not on oxygen, HTN,?  Prior cardiomyopathy 2009-MI ruled out 04/27/2015 and EF improved 55-60% - stress test, admission 06/14/2018 community-acquired pneumonia-came to emergency room shortness of breath periodic fever diarrhea initial concern for Covid Covid negative-felt to be secondary to COPD exacerbation   Data Reviewed:  BUN/creatinine 15/0.8 WBC 14 up from 6.7 but on steroids Portable CXR 1/28 no acute abnormality scarring  Assessment & Plan:  COPD exacerbation Initially used Solu-Medrol transition to prednisone 60, started azithromycin and did refillalbuterol, DuoNeb, Symbicort on discharge Leukocytosis Likely secondary to stress demargination from steroids-although was elevated on discharge no signs or symptoms of other issues Diarrhea  his diarrhea is improved while being on steroids and since coming to the hospital this raises a question of possible microscopic colitis-we will monitor trends once we  transition him to prednisone and see how he does HTN Continue amlodipine 5 monitor trends Prior nonischemic cardiomyopathy with improvement Negative stress test 2017 Monitor trends History of plantar fasciitis Limited prescription of tramadol given-he was supposed to have an appointment on 1/29 which he missed Continue continue gabapentin 300 daily     Discharge Exam: Vitals:   04/25/19 0509 04/25/19 0707  BP: 137/88   Pulse: 64 88  Resp: 16 16  Temp: 97.8 F (36.6 C)   SpO2: 96% 96%    General: Awake coherent no distress Cardiovascular: S1-S2 no murmur rub or gallop Respiratory: Clinically clear no added sound no rales no rhonchi no new issues Standing and was walking in the hallway Neurologically intact no focal deficit  Discharge Instructions   Discharge Instructions    Diet - low sodium heart healthy   Complete by: As directed    Discharge instructions   Complete by: As directed    Please fill all the meds Continue prednisoins and finish azithromycin We have refilled your inhalers as well as your meds for COPD Please quit smoking-you should start the Chantix and Nicotine pathc and find a stop date for smoking--I would encourage you to also work on your diet   Increase activity slowly   Complete by: As directed    MyChart COVID-19 home monitoring program   Complete by: Apr 25, 2019    Is the patient willing to use the [facility] for home monitoring?: Yes   Temperature monitoring   Complete by: Apr 25, 2019    After how many days would you like to receive a notification of this patient's flowsheet entries?: 1     Allergies as of 04/25/2019   No Known Allergies     Medication List    STOP taking  these medications   ciprofloxacin 500 MG tablet Commonly known as: Cipro   HYDROcodone-acetaminophen 5-325 MG tablet Commonly known as: Norco   methylPREDNISolone 4 MG Tbpk tablet Commonly known as: MEDROL DOSEPAK   oxyCODONE-acetaminophen 5-325 MG  tablet Commonly known as: Percocet     TAKE these medications   albuterol 108 (90 Base) MCG/ACT inhaler Commonly known as: VENTOLIN HFA Inhale 2 puffs into the lungs every 4 (four) hours as needed for wheezing or shortness of breath.   amLODipine 5 MG tablet Commonly known as: NORVASC Take 1 tablet (5 mg total) by mouth daily for 30 days.   azithromycin 250 MG tablet Commonly known as: ZITHROMAX Complete treatment   benzonatate 200 MG capsule Commonly known as: TESSALON Take 1 capsule (200 mg total) by mouth 3 (three) times daily as needed for cough.   Chantix Starting Month Pak 0.5 MG X 11 & 1 MG X 42 tablet Generic drug: varenicline Take one 0.5 mg tablet by mouth once daily for 3 days, then increase to one 0.5 mg tablet twice daily for 4 days, then increase to one 1 mg tablet twice daily.   gabapentin 300 MG capsule Commonly known as: NEURONTIN TAKE 1 CAPSULE BY MOUTH THREE TIMES A DAY What changed:   See the new instructions.  Another medication with the same name was removed. Continue taking this medication, and follow the directions you see here.   ipratropium-albuterol 0.5-2.5 (3) MG/3ML Soln Commonly known as: DUONEB Take 3 mLs by nebulization 3 (three) times daily.   nicotine 14 mg/24hr patch Commonly known as: NICODERM CQ - dosed in mg/24 hours Place 1 patch (14 mg total) onto the skin daily.   predniSONE 20 MG tablet Commonly known as: DELTASONE Take 3 tablets (60 mg total) by mouth daily before breakfast. Start taking on: April 26, 2019   sildenafil 20 MG tablet Commonly known as: REVATIO Take 20-40 mg by mouth as needed (before sexual activity).   Symbicort 160-4.5 MCG/ACT inhaler Generic drug: budesonide-formoterol Inhale 2 puffs into the lungs 2 (two) times daily.   traMADol 50 MG tablet Commonly known as: ULTRAM Take 1 tablet (50 mg total) by mouth every 8 (eight) hours as needed for up to 3 days for moderate pain.   zolpidem 5 MG  tablet Commonly known as: AMBIEN Take 1 tablet (5 mg total) by mouth at bedtime as needed for sleep.      No Known Allergies    The results of significant diagnostics from this hospitalization (including imaging, microbiology, ancillary and laboratory) are listed below for reference.    Significant Diagnostic Studies: DG Chest Port 1 View  Result Date: 04/23/2019 CLINICAL DATA:  Shortness of breath and COVID like symptoms EXAM: PORTABLE CHEST 1 VIEW COMPARISON:  02/03/2019 FINDINGS: Cardiac shadow is mildly enlarged but accentuated by the portable technique. Linear scarring is again noted in the right mid lung stable from the prior study. No focal infiltrate or sizable effusion is seen. No bony abnormality is noted. IMPRESSION: No acute abnormality seen. Stable scarring is noted in the right mid lung. Electronically Signed   By: Inez Catalina M.D.   On: 04/23/2019 12:30    Microbiology: Recent Results (from the past 240 hour(s))  Novel Coronavirus, NAA ([facility])     Status: None   Collection Time: 04/22/19  3:09 PM   Specimen: Nasopharyngeal(NP) swabs in vial transport medium   NASOPHARYNGE  TESTING  Result Value Ref Range Status   SARS-CoV-2, NAA Not Detected Not Detected  Final    Comment: This nucleic acid amplification test was developed and its performance characteristics determined by [employer], [employer] and [employer]. Nucleic acid amplification tests include RT-PCR and TMA. This test has not been FDA cleared or approved. This test has been authorized by FDA under an Emergency Use Authorization (EUA). This test is only authorized for the duration of time the declaration that circumstances exist justifying the authorization of the emergency use of in vitro diagnostic tests for detection of SARS-CoV-2 virus and/or diagnosis of COVID-19 infection under section 564(b)(1) of the Act, 21 U.S.C. [id number]) (1), unless the authorization is terminated or revoked sooner. When diagnostic testing is  negative, the possibility of a false negative result should be considered in the context of a patient's recent exposures and the presence of clinical signs and symptoms consistent with COVID-19. An individual without symptoms of COVID-19 and who is not shedding SARS-CoV-2 virus wo uld expect to have a negative (not detected) result in this assay.   Respiratory Panel by RT PCR (Flu A&B, Covid) - Nasopharyngeal Swab     Status: None   Collection Time: 04/23/19 11:34 AM   Specimen: Nasopharyngeal Swab  Result Value Ref Range Status   SARS Coronavirus 2 by RT PCR NEGATIVE NEGATIVE Final    Comment: (NOTE) SARS-CoV-2 target nucleic acids are NOT DETECTED. The SARS-CoV-2 RNA is generally detectable in upper respiratoy specimens during the acute phase of infection. The lowest concentration of SARS-CoV-2 viral copies this assay can detect is 131 copies/mL. A negative result does not preclude SARS-Cov-2 infection and should not be used as the sole basis for treatment or other patient management decisions. A negative result may occur with  improper specimen collection/handling, submission of specimen other than nasopharyngeal swab, presence of viral mutation(s) within the areas targeted by this assay, and inadequate number of viral copies (<131 copies/mL). A negative result must be combined with clinical observations, patient history, and epidemiological information. The expected result is Negative. Fact Sheet for Patients:  PinkCheek.be Fact Sheet for Healthcare Providers:  GravelBags.it This test is not yet ap proved or cleared by the [place] FDA and  has been authorized for detection and/or diagnosis of SARS-CoV-2 by FDA under an Emergency Use Authorization (EUA). This EUA will remain  in effect (meaning this test can be used) for the duration of the COVID-19 declaration under Section 564(b)(1) of the Act, 21 U.S.C. section  360bbb-3(b)(1), unless the authorization is terminated or revoked sooner.    Influenza A by PCR NEGATIVE NEGATIVE Final   Influenza B by PCR NEGATIVE NEGATIVE Final    Comment: (NOTE) The Xpert Xpress SARS-CoV-2/FLU/RSV assay is intended as an aid in  the diagnosis of influenza from Nasopharyngeal swab specimens and  should not be used as a sole basis for treatment. Nasal washings and  aspirates are unacceptable for Xpert Xpress SARS-CoV-2/FLU/RSV  testing. Fact Sheet for Patients: PinkCheek.be Fact Sheet for Healthcare Providers: GravelBags.it This test is not yet approved or cleared by the [place] FDA and  has been authorized for detection and/or diagnosis of SARS-CoV-2 by  FDA under an Emergency Use Authorization (EUA). This EUA will remain  in effect (meaning this test can be used) for the duration of the  Covid-19 declaration under Section 564(b)(1) of the Act, 21  U.S.C. section 360bbb-3(b)(1), unless the authorization is  terminated or revoked. Performed at [facility], [place] [address] [place],  [address]      Labs: Basic Metabolic Panel: Recent Labs  Lab 04/23/19  1134 04/24/19 0458 04/25/19 0457  NA 140 137 139  K 4.1 4.5 4.6  CL 111 107 110  CO2 24 23 22   GLUCOSE 109* 153* 129*  BUN 9 15 15   CREATININE 0.95 0.83 0.80  CALCIUM 9.2 9.1 9.0  MG 2.2  --   --    Liver Function Tests: No results for input(s): AST, ALT, ALKPHOS, BILITOT, PROT, ALBUMIN in the last 168 hours. No results for input(s): LIPASE, AMYLASE in the last 168 hours. No results for input(s): AMMONIA in the last 168 hours. CBC: Recent Labs  Lab 04/23/19 1704 04/24/19 0458 04/25/19 0457  WBC 6.7 14.1* 17.2*  NEUTROABS 5.8  --  14.6*  HGB 16.1 14.8 14.8  HCT 49.4 46.4 46.3  MCV 91.8 92.6 93.2  PLT 237 225 225   Cardiac Enzymes: No results for input(s): CKTOTAL, CKMB, CKMBINDEX, TROPONINI in the  last 168 hours. BNP: BNP (last 3 results) Recent Labs    06/09/18 2300 04/23/19 1134  BNP 15.5 44.6    ProBNP (last 3 results) No results for input(s): PROBNP in the last 8760 hours.  CBG: No results for input(s): GLUCAP in the last 168 hours.     Signed:  Nita Sells MD   [facility] 04/25/2019, 9:32 AM

## 2019-04-26 ENCOUNTER — Encounter (INDEPENDENT_AMBULATORY_CARE_PROVIDER_SITE_OTHER): Payer: Self-pay

## 2019-04-26 ENCOUNTER — Telehealth: Payer: Self-pay

## 2019-04-26 NOTE — Telephone Encounter (Signed)
Spoke with pt. About his questionnaire and he will contact his PCP in the morning.

## 2019-04-28 ENCOUNTER — Telehealth: Payer: Self-pay | Admitting: Podiatry

## 2019-04-28 ENCOUNTER — Telehealth: Payer: Self-pay | Admitting: *Deleted

## 2019-04-28 MED ORDER — TRAMADOL HCL 50 MG PO TABS: 50.0000 mg | ORAL_TABLET | Freq: Three times a day (TID) | ORAL | 0 refills | Status: AC | PRN

## 2019-04-28 MED ORDER — TRAMADOL HCL 50 MG PO TABS: 50.0000 mg | ORAL_TABLET | Freq: Three times a day (TID) | ORAL | 0 refills | Status: DC | PRN

## 2019-04-28 NOTE — Addendum Note (Signed)
Addended by: Hardie Pulley on: 04/28/2019 12:14 PM   Modules accepted: Orders

## 2019-04-28 NOTE — Telephone Encounter (Signed)
-----   Message from [name], DPM sent at 04/28/2019 12:13 PM EST ----- Done can you inform? ----- Message ----- From: Viviana Simpler, PMAC Sent: 04/28/2019  11:40 AM EST To: [name], DPM  Hey Dr March Rummage, patient would like a refill of the tramadol and was last filled by Dr Verlon Au on 04/25/2019.Lattie Haw

## 2019-04-28 NOTE — Telephone Encounter (Signed)
Pt called to r/s appt he was in hospital foe last appt 04/23/19 he was wanting a refill on tramadol

## 2019-04-28 NOTE — Telephone Encounter (Signed)
Left a message for the patient stating that Dr March Rummage called in the tramadol. [name]

## 2019-05-05 ENCOUNTER — Encounter: Payer: Self-pay | Admitting: Internal Medicine

## 2019-05-14 ENCOUNTER — Telehealth: Payer: Self-pay | Admitting: Podiatry

## 2019-05-14 ENCOUNTER — Ambulatory Visit: Payer: Medicaid Other | Admitting: Podiatry

## 2019-05-14 NOTE — Telephone Encounter (Signed)
This patient called today stating he is still experiencing pain following heel spur surgery and gastroc surgery on 7/29.  He says he has been treated by Dr.  March Rummage with medicine and injections following his surgery.  Patient says he is very frustrated that he feels it still painful now as before the surgery.  I told him to call the office tomorrow and schedule an appointment with Dr.  March Rummage.  Gardiner Barefoot DPM

## 2019-05-15 ENCOUNTER — Telehealth: Payer: Self-pay | Admitting: Podiatry

## 2019-05-15 NOTE — Telephone Encounter (Signed)
Pt called requesting a refill of Tramadol. Pt was scheduled for an appt yesterday but had to r/s until next week due to the office being closed. Pt states he is frustrated with the situation and would like a refill of the medication until he can come for his appt.

## 2019-05-15 NOTE — Telephone Encounter (Signed)
Hey can you please advise. [name]

## 2019-05-18 ENCOUNTER — Other Ambulatory Visit: Payer: Self-pay

## 2019-05-18 ENCOUNTER — Ambulatory Visit (AMBULATORY_SURGERY_CENTER): Payer: Self-pay | Admitting: *Deleted

## 2019-05-18 VITALS — Temp 97.3°F | Ht 74.0 in | Wt 270.0 lb

## 2019-05-18 DIAGNOSIS — R194 Change in bowel habit: Secondary | ICD-10-CM

## 2019-05-18 DIAGNOSIS — Z1211 Encounter for screening for malignant neoplasm of colon: Secondary | ICD-10-CM

## 2019-05-18 MED ORDER — SUPREP BOWEL PREP KIT 17.5-3.13-1.6 GM/177ML PO SOLN: 1.0000 | Freq: Once | ORAL | 0 refills | Status: AC

## 2019-05-18 NOTE — Progress Notes (Signed)
No egg or soy allergy known to patient  No issues with past sedation with any surgeries  or procedures, no intubation problems  No diet pills per patient No home 02 use per patient  No blood thinners per patient  Pt denies issues with constipation  No A fib or A flutter  EMMI video sent to pt's e mail   Pt has been having diarrhea x 1-1/2 months - was 5-6 times a day - now better- having increased gas and belching as well-   Due to the COVID-19 pandemic we are asking patients to follow these guidelines. Please only bring one care partner. Please be aware that your care partner may wait in the car in the parking lot or if they feel like they will be too hot to wait in the car, they may wait in the lobby on the 4th floor. All care partners are required to wear a mask the entire time (we do not have any that we can provide them), they need to practice social distancing, and we will do a Covid check for all patient's and care partners when you arrive. Also we will check their temperature and your temperature. If the care partner waits in their car they need to stay in the parking lot the entire time and we will call them on their cell phone when the patient is ready for discharge so they can bring the car to the front of the building. Also all patient's will need to wear a mask into building.

## 2019-05-21 ENCOUNTER — Other Ambulatory Visit: Payer: Self-pay

## 2019-05-21 ENCOUNTER — Ambulatory Visit: Payer: Medicaid Other | Admitting: Podiatry

## 2019-05-21 DIAGNOSIS — M792 Neuralgia and neuritis, unspecified: Secondary | ICD-10-CM

## 2019-05-21 DIAGNOSIS — M7989 Other specified soft tissue disorders: Secondary | ICD-10-CM | POA: Diagnosis not present

## 2019-05-21 MED ORDER — PREGABALIN 25 MG PO CAPS: 25.0000 mg | ORAL_CAPSULE | Freq: Two times a day (BID) | ORAL | 0 refills | Status: DC

## 2019-05-21 MED ORDER — HYDROCODONE-ACETAMINOPHEN 5-325 MG PO TABS: 1.0000 | ORAL_TABLET | Freq: Four times a day (QID) | ORAL | 0 refills | Status: DC | PRN

## 2019-05-21 NOTE — Progress Notes (Addendum)
  Subjective:  Patient ID: [name], male    DOB: 1961/01/12,  MRN: [id number]  Chief Complaint  Patient presents with  . Neuroma    Pt states injections do help but only for about 7-10 days. Pt states he has been limping due to the pain.    59 y.o. male presents with the above complaint. History confirmed with patient.  Missed an appointment due to COPD exacerbation. States the pain is getting to the point of limping now.  Objective:  Physical Exam: warm, good capillary refill, no trophic changes or ulcerative lesions, normal DP and PT pulses Left Foot: normal exam, no swelling, tenderness, instability; ligaments intact, full range of motion of all ankle/foot joints; absent sensation distal sural nerve distribution with Tinel's over the incision to the sural nerve distribution. 4 cm healed incision posterior leg Right Foot: normal exam, no swelling, tenderness, instability; ligaments intact, full range of motion of all ankle/foot joints   Assessment:   1. Neuritis    Plan:  Patient was evaluated and treated and all questions answered.  Neuritis -Repeat injections proximally and distally. -Since he does have a Tinel's sign proximally at the incision I do think it is worth trying to decompress the nerve. Will order Soft tissue ultrasound to evaluate for neuroma at this site. Plan for possible decompression after this time.  Procedure: Nerve Injection Location: Left sural nerve Skin Prep: Alcohol. Injectate: 0.5 cc 0.5% marcaine plain, 0.5 cc celestone  Disposition: Patient tolerated procedure well. Injection site dressed with a band-aid.  Procedure: Nerve Injection Location: Left sural nerve Skin Prep: Alcohol. Injectate: 0.5 cc 0.5% marcaine plain, 0.5 cc celestone Disposition: Patient tolerated procedure well. Injection site dressed with a band-aid.  Return in about 2 weeks (around 06/04/2019) for Nerve pain f/u.

## 2019-05-22 ENCOUNTER — Telehealth: Payer: Self-pay | Admitting: *Deleted

## 2019-05-22 DIAGNOSIS — M792 Neuralgia and neuritis, unspecified: Secondary | ICD-10-CM

## 2019-05-22 DIAGNOSIS — M7989 Other specified soft tissue disorders: Secondary | ICD-10-CM

## 2019-05-22 NOTE — Telephone Encounter (Signed)
Done

## 2019-05-22 NOTE — Telephone Encounter (Signed)
-----   Message from [name], DPM sent at 05/21/2019 11:40 AM EST ----- Can we order soft tissue Ultrasound but not have it scheduled for at least a week

## 2019-05-22 NOTE — Telephone Encounter (Signed)
[id number], FOR [place] [id number] LEFT FOOT EFFECTIVE: 05/22/2019, END: 11/18/2019. Faxed to [facility].

## 2019-05-27 ENCOUNTER — Ambulatory Visit (INDEPENDENT_AMBULATORY_CARE_PROVIDER_SITE_OTHER): Payer: Medicaid Other

## 2019-05-27 ENCOUNTER — Other Ambulatory Visit: Payer: Self-pay | Admitting: Internal Medicine

## 2019-05-27 DIAGNOSIS — Z1159 Encounter for screening for other viral diseases: Secondary | ICD-10-CM

## 2019-05-28 LAB — SARS CORONAVIRUS 2 (TAT 6-24 HRS): SARS Coronavirus 2: NEGATIVE

## 2019-06-01 ENCOUNTER — Encounter: Payer: Self-pay | Admitting: Internal Medicine

## 2019-06-01 ENCOUNTER — Other Ambulatory Visit: Payer: Self-pay

## 2019-06-01 ENCOUNTER — Ambulatory Visit (AMBULATORY_SURGERY_CENTER): Payer: Medicaid Other | Admitting: Internal Medicine

## 2019-06-01 VITALS — BP 145/79 | HR 50 | Temp 97.1°F | Resp 11 | Ht 74.0 in | Wt 270.0 lb

## 2019-06-01 DIAGNOSIS — R197 Diarrhea, unspecified: Secondary | ICD-10-CM

## 2019-06-01 DIAGNOSIS — D123 Benign neoplasm of transverse colon: Secondary | ICD-10-CM

## 2019-06-01 DIAGNOSIS — K635 Polyp of colon: Secondary | ICD-10-CM

## 2019-06-01 DIAGNOSIS — D125 Benign neoplasm of sigmoid colon: Secondary | ICD-10-CM

## 2019-06-01 DIAGNOSIS — Z1211 Encounter for screening for malignant neoplasm of colon: Secondary | ICD-10-CM

## 2019-06-01 MED ORDER — SODIUM CHLORIDE 0.9 % IV SOLN: 500.0000 mL | Freq: Once | INTRAVENOUS | Status: DC

## 2019-06-01 NOTE — Op Note (Signed)
[facility] Patient Name: [name] Procedure Date: 06/01/2019 11:50 AM MRN: [id number] Endoscopist: Docia Chuck. Henrene Pastor , MD Age: 59 Referring MD:  Date of Birth: 1961-03-26 Gender: Male Account #: [id number] Procedure:                Colonoscopy with cold snare polypectomy x2; with                            biopsies Indications:              Screening for colorectal malignant neoplasm,                            Incidental diarrhea noted for several months Medicines:                Monitored Anesthesia Care Procedure:                Pre-Anesthesia Assessment:                           - Prior to the procedure, a History and Physical                            was performed, and patient medications and                            allergies were reviewed. The patient's tolerance of                            previous anesthesia was also reviewed. The risks                            and benefits of the procedure and the sedation                            options and risks were discussed with the patient.                            All questions were answered, and informed consent                            was obtained. Prior Anticoagulants: The patient has                            taken no previous anticoagulant or antiplatelet                            agents. ASA Grade Assessment: II - A patient with                            mild systemic disease. After reviewing the risks                            and benefits, the patient was deemed in  satisfactory condition to undergo the procedure.                           After obtaining informed consent, the colonoscope                            was passed under direct vision. Throughout the                            procedure, the patient's blood pressure, pulse, and                            oxygen saturations were monitored continuously. The                            Colonoscope was  introduced through the anus and                            advanced to the the cecum, identified by                            appendiceal orifice and ileocecal valve. The                            terminal ileum, ileocecal valve, appendiceal                            orifice, and rectum were photographed. The quality                            of the bowel preparation was excellent. The                            colonoscopy was performed without difficulty. The                            patient tolerated the procedure well. The bowel                            preparation used was SUPREP via split dose                            instruction. Scope In: 11:59:47 AM Scope Out: 12:20:25 PM Scope Withdrawal Time: 0 hours 18 minutes 9 seconds  Total Procedure Duration: 0 hours 20 minutes 38 seconds  Findings:                 The terminal ileum appeared normal.                           Two polyps were found in the sigmoid colon and                            transverse colon. The polyps were 4 to 6 mm in  size. These polyps were removed with a cold snare.                            Resection and retrieval were complete.                           Multiple diverticula were found in the left colon.                           Internal hemorrhoids were found during retroflexion.                           The entire examined colon appeared normal on direct                            and retroflexion views. Biopsies for histology were                            taken with a cold forceps from the entire colon for                            evaluation of microscopic colitis. Complications:            No immediate complications. Estimated blood loss:                            None. Estimated Blood Loss:     Estimated blood loss: none. Impression:               - The examined portion of the ileum was normal.                           - Two 4 to 6 mm polyps in the sigmoid  colon and in                            the transverse colon, removed with a cold snare.                            Resected and retrieved.                           - Diverticulosis in the left colon.                           - Internal hemorrhoids.                           - The entire examined colon is normal on direct and                            retroflexion views. Recommendation:           - Repeat colonoscopy in 7-10 years for surveillance.                           -  Patient has a contact number available for                            emergencies. The signs and symptoms of potential                            delayed complications were discussed with the                            patient. Return to normal activities tomorrow.                            Written discharge instructions were provided to the                            patient.                           - Resume previous diet.                           - Continue present medications.                           - Await pathology results. Docia Chuck. Henrene Pastor, MD 06/01/2019 [id number] PM This report has been signed electronically.

## 2019-06-01 NOTE — Patient Instructions (Signed)
YOU HAD AN ENDOSCOPIC PROCEDURE TODAY AT THE [facility]:   Refer to the procedure report that was given to you for any specific questions about what was found during the examination.  If the procedure report does not answer your questions, please call your gastroenterologist to clarify.  If you requested that your care partner not be given the details of your procedure findings, then the procedure report has been included in a sealed envelope for you to review at your convenience later.  YOU SHOULD EXPECT: Some feelings of bloating in the abdomen. Passage of more gas than usual.  Walking can help get rid of the air that was put into your GI tract during the procedure and reduce the bloating. If you had a lower endoscopy (such as a colonoscopy or flexible sigmoidoscopy) you may notice spotting of blood in your stool or on the toilet paper. If you underwent a bowel prep for your procedure, you may not have a normal bowel movement for a few days.  Please Note:  You might notice some irritation and congestion in your nose or some drainage.  This is from the oxygen used during your procedure.  There is no need for concern and it should clear up in a day or so.  SYMPTOMS TO REPORT IMMEDIATELY:   Following lower endoscopy (colonoscopy or flexible sigmoidoscopy):  Excessive amounts of blood in the stool  Significant tenderness or worsening of abdominal pains  Swelling of the abdomen that is new, acute  Fever of 100F or higher   For urgent or emergent issues, a gastroenterologist can be reached at any hour by calling [phone]. Do not use MyChart messaging for urgent concerns.    DIET:  We do recommend a small meal at first, but then you may proceed to your regular diet.  Drink plenty of fluids but you should avoid alcoholic beverages for 24 hours.  MEDICATIONS: Continue present medications.  Please see handouts given to you by your recovery nurse.  ACTIVITY:  You should plan to  take it easy for the rest of today and you should NOT DRIVE or use heavy machinery until tomorrow (because of the sedation medicines used during the test).    FOLLOW UP: Our staff will call the number listed on your records 48-72 hours following your procedure to check on you and address any questions or concerns that you may have regarding the information given to you following your procedure. If we do not reach you, we will leave a message.  We will attempt to reach you two times.  During this call, we will ask if you have developed any symptoms of COVID 19. If you develop any symptoms (ie: fever, flu-like symptoms, shortness of breath, cough etc.) before then, please call [phone].  If you test positive for Covid 19 in the 2 weeks post procedure, please call and report this information to us.    If any biopsies were taken you will be contacted by phone or by letter within the next 1-3 weeks.  Please call us at [phone] if you have not heard about the biopsies in 3 weeks.   Thank you for allowing us to provide for your healthcare needs today.   SIGNATURES/CONFIDENTIALITY: You and/or your care partner have signed paperwork which will be entered into your electronic medical record.  These signatures attest to the fact that that the information above on your After Visit Summary has been reviewed and is understood.  Full responsibility of the   confidentiality of this discharge information lies with you and/or your care-partner. 

## 2019-06-01 NOTE — Progress Notes (Signed)
Called to room to assist during endoscopic procedure.  Patient ID and intended procedure confirmed with present staff. Received instructions for my participation in the procedure from the performing physician.  

## 2019-06-01 NOTE — Progress Notes (Signed)
Pt's states no medical or surgical changes since previsit or office visit. 

## 2019-06-01 NOTE — Progress Notes (Signed)
PT taken to PACU. Monitors in place. VSS. Report given to RN. 

## 2019-06-03 ENCOUNTER — Telehealth: Payer: Self-pay

## 2019-06-03 ENCOUNTER — Encounter: Payer: Self-pay | Admitting: Internal Medicine

## 2019-06-03 NOTE — Telephone Encounter (Signed)
  Follow up Call-  Call back number 06/01/2019  Post procedure Call Back phone  # [phone]  Permission to leave phone message Yes  Some recent data might be hidden     Patient questions:  Do you have a fever, pain , or abdominal swelling? No. Pain Score  0 *  Have you tolerated food without any problems? Yes.    Have you been able to return to your normal activities? Yes.    Do you have any questions about your discharge instructions: Diet   No. Medications  No. Follow up visit  No.  Do you have questions or concerns about your Care? No.  Actions: * If pain score is 4 or above: 1. No action needed, pain <4.Have you developed a fever since your procedure? no  2.   Have you had an respiratory symptoms (SOB or cough) since your procedure? no  3.   Have you tested positive for COVID 19 since your procedure no  4.   Have you had any family members/close contacts diagnosed with the COVID 19 since your procedure?  no   If yes to any of these questions please route to Joylene John, RN and Alphonsa Gin, Therapist, sports.

## 2019-06-03 NOTE — Telephone Encounter (Signed)
First attempt follow up call to pt, mailbox full.,

## 2019-06-04 ENCOUNTER — Ambulatory Visit (INDEPENDENT_AMBULATORY_CARE_PROVIDER_SITE_OTHER): Payer: Medicaid Other | Admitting: Podiatry

## 2019-06-04 ENCOUNTER — Other Ambulatory Visit: Payer: Self-pay

## 2019-06-04 DIAGNOSIS — M216X1 Other acquired deformities of right foot: Secondary | ICD-10-CM

## 2019-06-04 DIAGNOSIS — M7989 Other specified soft tissue disorders: Secondary | ICD-10-CM

## 2019-06-04 DIAGNOSIS — M792 Neuralgia and neuritis, unspecified: Secondary | ICD-10-CM

## 2019-06-04 MED ORDER — HYDROCODONE-ACETAMINOPHEN 5-325 MG PO TABS: 1.0000 | ORAL_TABLET | Freq: Four times a day (QID) | ORAL | 0 refills | Status: DC | PRN

## 2019-06-04 NOTE — Progress Notes (Signed)
  Subjective:  Patient ID: [name], male    DOB: 12/04/1960,  MRN: [id number]  Chief Complaint  Patient presents with  . Foot Pain    Pt states his feet are exactly the same. Injections provide temporary relief.    59 y.o. male presents with the above complaint. History confirmed with patient.  Did not get US performed. Injections helped for 5 days. Objective:  Physical Exam: warm, good capillary refill, no trophic changes or ulcerative lesions, normal DP and PT pulses Left Foot: normal exam, no swelling, tenderness, instability; ligaments intact, full range of motion of all ankle/foot joints; absent sensation distal sural nerve distribution with Tinel's over the incision to the sural nerve distribution. 4 cm healed incision posterior leg Right Foot: normal exam, no swelling, tenderness, instability; ligaments intact, full range of motion of all ankle/foot joints   Assessment:   1. Neuritis   2. Soft tissue mass    Plan:  Patient was evaluated and treated and all questions answered.  Neuritis -Pending [place]. Hold off injection today. -Will review [place] next visit and further discuss. Pending on [place] may need MRI.  Return in about 2 weeks (around 06/18/2019) for Ultrasound review.

## 2019-06-04 NOTE — Telephone Encounter (Signed)
Dr. March Rummage asked if pt had soft tissue US. I reviewed orders and appt scheduling and pt did not schedule although orders were faxed to [place] with the [facility]. I gave copy of [place] orders with [phone] to schedule.

## 2019-06-09 ENCOUNTER — Telehealth: Payer: Self-pay | Admitting: *Deleted

## 2019-06-09 NOTE — Telephone Encounter (Signed)
Pt states he lost the phone number to [place].

## 2019-06-09 NOTE — Telephone Encounter (Signed)
I spoke with pt and he states [facility] called yesterday and he is scheduled.

## 2019-06-11 ENCOUNTER — Other Ambulatory Visit: Payer: Medicaid Other

## 2019-06-12 ENCOUNTER — Ambulatory Visit
Admission: RE | Admit: 2019-06-12 | Discharge: 2019-06-12 | Disposition: A | Discharge status: Home / Self Care | Payer: Medicaid Other | Source: Ambulatory Visit | Attending: Podiatry | Admitting: Podiatry

## 2019-06-12 DIAGNOSIS — M7989 Other specified soft tissue disorders: Secondary | ICD-10-CM

## 2019-06-12 DIAGNOSIS — M792 Neuralgia and neuritis, unspecified: Secondary | ICD-10-CM

## 2019-06-19 ENCOUNTER — Ambulatory Visit: Payer: Self-pay | Admitting: Podiatry

## 2019-06-19 ENCOUNTER — Ambulatory Visit: Payer: Medicaid Other | Admitting: Podiatry

## 2019-06-19 ENCOUNTER — Other Ambulatory Visit: Payer: Self-pay

## 2019-06-19 ENCOUNTER — Encounter: Payer: Self-pay | Admitting: Podiatry

## 2019-06-19 VITALS — Temp 97.8°F

## 2019-06-19 DIAGNOSIS — F17210 Nicotine dependence, cigarettes, uncomplicated: Secondary | ICD-10-CM

## 2019-06-19 DIAGNOSIS — M7989 Other specified soft tissue disorders: Secondary | ICD-10-CM | POA: Diagnosis not present

## 2019-06-19 DIAGNOSIS — G5782 Other specified mononeuropathies of left lower limb: Secondary | ICD-10-CM

## 2019-06-19 DIAGNOSIS — D3613 Benign neoplasm of peripheral nerves and autonomic nervous system of lower limb, including hip: Secondary | ICD-10-CM

## 2019-06-19 DIAGNOSIS — J441 Chronic obstructive pulmonary disease with (acute) exacerbation: Secondary | ICD-10-CM

## 2019-06-19 DIAGNOSIS — M792 Neuralgia and neuritis, unspecified: Secondary | ICD-10-CM

## 2019-06-19 NOTE — Patient Instructions (Signed)
Pre-Operative Instructions  Congratulations, you have decided to take an important step towards improving your quality of life.  You can be assured that the doctors and staff at [facility] will be with you every step of the way.  Here are some important things you should know:  1. Plan to be at the surgery center/hospital at least 1 (one) hour prior to your scheduled time, unless otherwise directed by the surgical center/hospital staff.  You must have a responsible adult accompany you, remain during the surgery and drive you home.  Make sure you have directions to the surgical center/hospital to ensure you arrive on time. 2. If you are having surgery at [facility] or [facility], you will need a copy of your medical history and physical form from your family physician within one month prior to the date of surgery. We will give you a form for your primary physician to complete.  3. We make every effort to accommodate the date you request for surgery.  However, there are times where surgery dates or times have to be moved.  We will contact you as soon as possible if a change in schedule is required.   4. No aspirin/ibuprofen for one week before surgery.  If you are on aspirin, any non-steroidal anti-inflammatory medications (Mobic, Aleve, Ibuprofen) should not be taken seven (7) days prior to your surgery.  You make take Tylenol for pain prior to surgery.  5. Medications - If you are taking daily heart and blood pressure medications, seizure, reflux, allergy, asthma, anxiety, pain or diabetes medications, make sure you notify the surgery center/hospital before the day of surgery so they can tell you which medications you should take or avoid the day of surgery. 6. No food or drink after midnight the night before surgery unless directed otherwise by surgical center/hospital staff. 7. No alcoholic beverages 24-hours prior to surgery.  No smoking 24-hours prior or 24-hours after  surgery. 8. Wear loose pants or shorts. They should be loose enough to fit over bandages, boots, and casts. 9. Don't wear slip-on shoes. Sneakers are preferred. 10. Bring your boot with you to the surgery center/hospital.  Also bring crutches or a walker if your physician has prescribed it for you.  If you do not have this equipment, it will be provided for you after surgery. 11. If you have not been contacted by the surgery center/hospital by the day before your surgery, call to confirm the date and time of your surgery. 12. Leave-time from work may vary depending on the type of surgery you have.  Appropriate arrangements should be made prior to surgery with your employer. 13. Prescriptions will be provided immediately following surgery by your doctor.  Fill these as soon as possible after surgery and take the medication as directed. Pain medications will not be refilled on weekends and must be approved by the doctor. 14. Remove nail polish on the operative foot and avoid getting pedicures prior to surgery. 15. Wash the night before surgery.  The night before surgery wash the foot and leg well with water and the antibacterial soap provided. Be sure to pay special attention to beneath the toenails and in between the toes.  Wash for at least three (3) minutes. Rinse thoroughly with water and dry well with a towel.  Perform this wash unless told not to do so by your physician.  Enclosed: 1 Ice pack (please put in freezer the night before surgery)   1 Hibiclens skin cleaner     Pre-op instructions  If you have any questions regarding the instructions, please do not hesitate to call our office.  [place]: [address], [place], [place] [address] -- [phone]  [place]: [address] [place], [place] [address] -- [phone]  [place]: [address], [place], [place] [address] -- [phone]   Website: [email or web address] 

## 2019-06-22 ENCOUNTER — Other Ambulatory Visit: Payer: Self-pay | Admitting: Podiatry

## 2019-06-22 MED ORDER — PREGABALIN 25 MG PO CAPS: 25.0000 mg | ORAL_CAPSULE | Freq: Two times a day (BID) | ORAL | 0 refills | Status: DC

## 2019-06-22 MED ORDER — HYDROCODONE-ACETAMINOPHEN 5-325 MG PO TABS: 1.0000 | ORAL_TABLET | Freq: Four times a day (QID) | ORAL | 0 refills | Status: DC | PRN

## 2019-06-22 NOTE — Addendum Note (Signed)
Addended by: Hardie Pulley on: 06/22/2019 11:13 AM   Modules accepted: Orders

## 2019-06-22 NOTE — Telephone Encounter (Signed)
Pt. Called and said his refill was never called in and its been a rough couple of days.

## 2019-06-24 ENCOUNTER — Other Ambulatory Visit: Payer: Self-pay

## 2019-06-24 ENCOUNTER — Encounter (HOSPITAL_BASED_OUTPATIENT_CLINIC_OR_DEPARTMENT_OTHER): Payer: Self-pay | Admitting: Podiatry

## 2019-06-24 NOTE — Progress Notes (Addendum)
Spoke with Janett Billow zanetto pa ok to proceed  Spoke w/ via phone for pre-op interview---patient Lab needs dos----I stat 8,              COVID test ------04-29-2019 1205 Arrive at -------1000 am 07-01-2019 NPO after ------midnight Medications to take morning of surgery -----albuterol inhaler prn/bring inhaler, duoneb, symbicort, amlodipine, hydrocodone Diabetic medication -----n/a Patient Special Instructions -----none Pre-Op special Istructions -----none Patient verbalized understanding of instructions that were given at this phone interview. Patient denies shortness of breath, chest pain, fever, cough a this phone interview.  Anesthesia : copd exacerbation 04-23-2019, has cardiomypathy no cardiologist Chart to Janett Billow zanetto pa for review  PCP: dr Lillia Corporal Cardiologist :none Chest x-ray :04-23-2019 EKG :04-23-2019 Echo :04-27-2015 epic ( ef 55-60% ) Cardiac Cath : 12-10-2007 (per history note ef 35 to 40 %) [facility] dr Melvyn Novas 03-08-2017 epic note says follow up in 4 weeks Sleep Study/ CPAP : ? Has sleep apnea, says never had sleep study, but was told he had osa Fasting Blood Sugar :      / Checks Blood Sugar -- times a day:  n/a Blood Thinner/ Instructions /Last Dose:n/a ASA / Instructions/ Last Dose : n/a  Patient denies shortness of breath, chest pain, fever, and cough at this phone interview.

## 2019-06-27 ENCOUNTER — Inpatient Hospital Stay (HOSPITAL_COMMUNITY): Admission: RE | Admit: 2019-06-27 | Payer: Medicaid Other | Source: Ambulatory Visit

## 2019-06-29 ENCOUNTER — Other Ambulatory Visit (HOSPITAL_COMMUNITY)
Admission: RE | Admit: 2019-06-29 | Discharge: 2019-06-29 | Disposition: A | Discharge status: Home / Self Care | Payer: Medicaid Other | Source: Ambulatory Visit | Attending: Podiatry | Admitting: Podiatry

## 2019-06-29 DIAGNOSIS — Z01812 Encounter for preprocedural laboratory examination: Secondary | ICD-10-CM | POA: Diagnosis present

## 2019-06-29 DIAGNOSIS — Z20822 Contact with and (suspected) exposure to covid-19: Secondary | ICD-10-CM | POA: Insufficient documentation

## 2019-06-29 LAB — SARS CORONAVIRUS 2 (TAT 6-24 HRS): SARS Coronavirus 2: NEGATIVE

## 2019-06-30 NOTE — Anesthesia Preprocedure Evaluation (Addendum)
Anesthesia Evaluation  Patient identified by MRN, date of birth, ID band Patient awake    Reviewed: Allergy & Precautions, NPO status , Patient's Chart, lab work & pertinent test results  History of Anesthesia Complications Negative for: history of anesthetic complications  Airway Mallampati: II  TM Distance: >3 FB Neck ROM: Full    Dental  (+) Missing,    Pulmonary sleep apnea , COPD, Current Smoker,    Pulmonary exam normal        Cardiovascular hypertension, Normal cardiovascular exam  TTE 2017: EF 55-60%, mild RAE   Neuro/Psych negative neurological ROS  negative psych ROS   GI/Hepatic negative GI ROS, Neg liver ROS,   Endo/Other  negative endocrine ROS  Renal/GU negative Renal ROS  negative genitourinary   Musculoskeletal  (+) Arthritis ,   Abdominal   Peds  Hematology negative hematology ROS (+)   Anesthesia Other Findings Day of surgery medications reviewed with patient.  Reproductive/Obstetrics negative OB ROS                            Anesthesia Physical Anesthesia Plan  ASA: II  Anesthesia Plan: General   Post-op Pain Management:    Induction: Intravenous  PONV Risk Score and Plan: 1 and Treatment may vary due to age or medical condition, Ondansetron, Midazolam and Dexamethasone  Airway Management Planned: Oral ETT  Additional Equipment: None  Intra-op Plan:   Post-operative Plan: Extubation in OR  Informed Consent: I have reviewed the patients History and Physical, chart, labs and discussed the procedure including the risks, benefits and alternatives for the proposed anesthesia with the patient or authorized representative who has indicated his/her understanding and acceptance.     Dental advisory given  Plan Discussed with: CRNA  Anesthesia Plan Comments:        Anesthesia Quick Evaluation

## 2019-07-01 ENCOUNTER — Encounter (HOSPITAL_BASED_OUTPATIENT_CLINIC_OR_DEPARTMENT_OTHER)
Admission: RE | Disposition: A | Discharge status: Home / Self Care | Payer: Self-pay | Source: Home / Self Care | Attending: Podiatry

## 2019-07-01 ENCOUNTER — Ambulatory Visit (HOSPITAL_BASED_OUTPATIENT_CLINIC_OR_DEPARTMENT_OTHER): Payer: Medicaid Other | Admitting: Physician Assistant

## 2019-07-01 ENCOUNTER — Ambulatory Visit (HOSPITAL_BASED_OUTPATIENT_CLINIC_OR_DEPARTMENT_OTHER)
Admission: RE | Admit: 2019-07-01 | Discharge: 2019-07-01 | Disposition: A | Discharge status: Home / Self Care | Payer: Medicaid Other | Attending: Podiatry | Admitting: Podiatry

## 2019-07-01 ENCOUNTER — Encounter (HOSPITAL_BASED_OUTPATIENT_CLINIC_OR_DEPARTMENT_OTHER): Payer: Self-pay | Admitting: Podiatry

## 2019-07-01 ENCOUNTER — Other Ambulatory Visit: Payer: Self-pay

## 2019-07-01 DIAGNOSIS — D3613 Benign neoplasm of peripheral nerves and autonomic nervous system of lower limb, including hip: Secondary | ICD-10-CM | POA: Diagnosis present

## 2019-07-01 DIAGNOSIS — I10 Essential (primary) hypertension: Secondary | ICD-10-CM | POA: Diagnosis not present

## 2019-07-01 DIAGNOSIS — J449 Chronic obstructive pulmonary disease, unspecified: Secondary | ICD-10-CM | POA: Diagnosis not present

## 2019-07-01 DIAGNOSIS — F17210 Nicotine dependence, cigarettes, uncomplicated: Secondary | ICD-10-CM | POA: Diagnosis not present

## 2019-07-01 DIAGNOSIS — M1990 Unspecified osteoarthritis, unspecified site: Secondary | ICD-10-CM | POA: Insufficient documentation

## 2019-07-01 DIAGNOSIS — G5782 Other specified mononeuropathies of left lower limb: Secondary | ICD-10-CM

## 2019-07-01 DIAGNOSIS — G4730 Sleep apnea, unspecified: Secondary | ICD-10-CM | POA: Insufficient documentation

## 2019-07-01 DIAGNOSIS — G5762 Lesion of plantar nerve, left lower limb: Secondary | ICD-10-CM

## 2019-07-01 HISTORY — PX: NERVE REPAIR: SHX2083

## 2019-07-01 LAB — POCT I-STAT, CHEM 8
BUN: 5 mg/dL — ABNORMAL LOW (ref 6–20)
Calcium, Ion: 1.27 mmol/L (ref 1.15–1.40)
Chloride: 104 mmol/L (ref 98–111)
Creatinine, Ser: 0.8 mg/dL (ref 0.61–1.24)
Glucose, Bld: 98 mg/dL (ref 70–99)
HCT: 48 % (ref 39.0–52.0)
Hemoglobin: 16.3 g/dL (ref 13.0–17.0)
Potassium: 4.1 mmol/L (ref 3.5–5.1)
Sodium: 139 mmol/L (ref 135–145)
TCO2: 24 mmol/L (ref 22–32)

## 2019-07-01 SURGERY — REPAIR, NERVE
Anesthesia: General | Site: Leg Lower | Laterality: Left

## 2019-07-01 MED ORDER — GABAPENTIN 300 MG PO CAPS
ORAL_CAPSULE | ORAL | Status: AC
  Filled 2019-07-01: qty 1

## 2019-07-01 MED ORDER — ROCURONIUM BROMIDE 10 MG/ML (PF) SYRINGE
PREFILLED_SYRINGE | INTRAVENOUS | Status: AC
  Filled 2019-07-01: qty 10

## 2019-07-01 MED ORDER — PREGABALIN 50 MG PO CAPS: 50.0000 mg | ORAL_CAPSULE | Freq: Two times a day (BID) | ORAL | 0 refills | Status: DC

## 2019-07-01 MED ORDER — ACETAMINOPHEN 500 MG PO TABS
1000.0000 mg | ORAL_TABLET | Freq: Once | ORAL | Status: AC
  Administered 2019-07-01: 1000 mg via ORAL
  Filled 2019-07-01: qty 2

## 2019-07-01 MED ORDER — EPHEDRINE 5 MG/ML INJ
INTRAVENOUS | Status: AC
  Filled 2019-07-01: qty 10

## 2019-07-01 MED ORDER — DEXAMETHASONE SODIUM PHOSPHATE 10 MG/ML IJ SOLN
INTRAMUSCULAR | Status: DC | PRN
  Administered 2019-07-01: 4 mg

## 2019-07-01 MED ORDER — ROCURONIUM BROMIDE 10 MG/ML (PF) SYRINGE
PREFILLED_SYRINGE | INTRAVENOUS | Status: DC | PRN
  Administered 2019-07-01: 60 mg via INTRAVENOUS
  Administered 2019-07-01: 10 mg via INTRAVENOUS

## 2019-07-01 MED ORDER — PROPOFOL 10 MG/ML IV BOLUS
INTRAVENOUS | Status: DC | PRN
  Administered 2019-07-01: 20 mg via INTRAVENOUS
  Administered 2019-07-01: 30 mg via INTRAVENOUS
  Administered 2019-07-01: 220 mg via INTRAVENOUS

## 2019-07-01 MED ORDER — CEPHALEXIN 500 MG PO CAPS: 500.0000 mg | ORAL_CAPSULE | Freq: Two times a day (BID) | ORAL | 0 refills | Status: DC

## 2019-07-01 MED ORDER — MIDAZOLAM HCL 2 MG/2ML IJ SOLN
INTRAMUSCULAR | Status: DC | PRN
  Administered 2019-07-01: 2 mg via INTRAVENOUS

## 2019-07-01 MED ORDER — PHENYLEPHRINE 40 MCG/ML (10ML) SYRINGE FOR IV PUSH (FOR BLOOD PRESSURE SUPPORT)
PREFILLED_SYRINGE | INTRAVENOUS | Status: AC
  Filled 2019-07-01: qty 10

## 2019-07-01 MED ORDER — ARTIFICIAL TEARS OPHTHALMIC OINT
TOPICAL_OINTMENT | OPHTHALMIC | Status: AC
  Filled 2019-07-01: qty 3.5

## 2019-07-01 MED ORDER — BUPIVACAINE HCL (PF) 0.5 % IJ SOLN
INTRAMUSCULAR | Status: DC | PRN
  Administered 2019-07-01: 1 mL
  Administered 2019-07-01: 10 mL

## 2019-07-01 MED ORDER — PROMETHAZINE HCL 25 MG/ML IJ SOLN
6.2500 mg | INTRAMUSCULAR | Status: DC | PRN
  Filled 2019-07-01: qty 1

## 2019-07-01 MED ORDER — CEFAZOLIN SODIUM-DEXTROSE 2-4 GM/100ML-% IV SOLN
2.0000 g | INTRAVENOUS | Status: AC
  Administered 2019-07-01: 3 g via INTRAVENOUS
  Filled 2019-07-01: qty 100

## 2019-07-01 MED ORDER — OXYCODONE HCL 5 MG/5ML PO SOLN
5.0000 mg | Freq: Once | ORAL | Status: DC | PRN
  Filled 2019-07-01: qty 5

## 2019-07-01 MED ORDER — CELECOXIB 400 MG PO CAPS
400.0000 mg | ORAL_CAPSULE | Freq: Once | ORAL | Status: AC
  Administered 2019-07-01: 400 mg via ORAL
  Filled 2019-07-01: qty 1

## 2019-07-01 MED ORDER — DEXAMETHASONE SODIUM PHOSPHATE 10 MG/ML IJ SOLN
INTRAMUSCULAR | Status: AC
  Filled 2019-07-01: qty 1

## 2019-07-01 MED ORDER — CEFAZOLIN SODIUM-DEXTROSE 2-4 GM/100ML-% IV SOLN
INTRAVENOUS | Status: AC
  Filled 2019-07-01: qty 100

## 2019-07-01 MED ORDER — CEFAZOLIN SODIUM-DEXTROSE 1-4 GM/50ML-% IV SOLN
INTRAVENOUS | Status: AC
  Filled 2019-07-01: qty 50

## 2019-07-01 MED ORDER — LIDOCAINE 2% (20 MG/ML) 5 ML SYRINGE
INTRAMUSCULAR | Status: DC | PRN
  Administered 2019-07-01: 100 mg via INTRAVENOUS

## 2019-07-01 MED ORDER — OXYCODONE-ACETAMINOPHEN 10-325 MG PO TABS: 1.0000 | ORAL_TABLET | ORAL | 0 refills | Status: DC | PRN

## 2019-07-01 MED ORDER — CELECOXIB 200 MG PO CAPS
ORAL_CAPSULE | ORAL | Status: AC
  Filled 2019-07-01: qty 2

## 2019-07-01 MED ORDER — FENTANYL CITRATE (PF) 100 MCG/2ML IJ SOLN
25.0000 ug | INTRAMUSCULAR | Status: DC | PRN
  Filled 2019-07-01: qty 1

## 2019-07-01 MED ORDER — LACTATED RINGERS IV SOLN
INTRAVENOUS | Status: DC
  Administered 2019-07-01: 50 mL/h via INTRAVENOUS
  Filled 2019-07-01: qty 1000

## 2019-07-01 MED ORDER — SUGAMMADEX SODIUM 200 MG/2ML IV SOLN
INTRAVENOUS | Status: DC | PRN
  Administered 2019-07-01: 300 mg via INTRAVENOUS

## 2019-07-01 MED ORDER — ONDANSETRON HCL 4 MG/2ML IJ SOLN
INTRAMUSCULAR | Status: DC | PRN
  Administered 2019-07-01: 4 mg via INTRAVENOUS

## 2019-07-01 MED ORDER — MIDAZOLAM HCL 2 MG/2ML IJ SOLN
INTRAMUSCULAR | Status: AC
  Filled 2019-07-01: qty 2

## 2019-07-01 MED ORDER — FENTANYL CITRATE (PF) 100 MCG/2ML IJ SOLN
INTRAMUSCULAR | Status: AC
  Filled 2019-07-01: qty 2

## 2019-07-01 MED ORDER — PROPOFOL 10 MG/ML IV BOLUS
INTRAVENOUS | Status: AC
  Filled 2019-07-01: qty 40

## 2019-07-01 MED ORDER — EPHEDRINE SULFATE-NACL 50-0.9 MG/10ML-% IV SOSY
PREFILLED_SYRINGE | INTRAVENOUS | Status: DC | PRN
  Administered 2019-07-01 (×2): 15 mg via INTRAVENOUS

## 2019-07-01 MED ORDER — GABAPENTIN 300 MG PO CAPS
300.0000 mg | ORAL_CAPSULE | Freq: Once | ORAL | Status: AC
  Administered 2019-07-01: 300 mg via ORAL
  Filled 2019-07-01: qty 1

## 2019-07-01 MED ORDER — OXYCODONE HCL 5 MG PO TABS
5.0000 mg | ORAL_TABLET | Freq: Once | ORAL | Status: DC | PRN
  Filled 2019-07-01: qty 1

## 2019-07-01 MED ORDER — ACETAMINOPHEN 500 MG PO TABS
ORAL_TABLET | ORAL | Status: AC
  Filled 2019-07-01: qty 2

## 2019-07-01 MED ORDER — FENTANYL CITRATE (PF) 100 MCG/2ML IJ SOLN
INTRAMUSCULAR | Status: DC | PRN
  Administered 2019-07-01: 50 ug via INTRAVENOUS
  Administered 2019-07-01 (×2): 25 ug via INTRAVENOUS
  Administered 2019-07-01: 50 ug via INTRAVENOUS

## 2019-07-01 MED ORDER — ONDANSETRON HCL 4 MG/2ML IJ SOLN
INTRAMUSCULAR | Status: AC
  Filled 2019-07-01: qty 2

## 2019-07-01 MED ORDER — DEXAMETHASONE SODIUM PHOSPHATE 10 MG/ML IJ SOLN
INTRAMUSCULAR | Status: DC | PRN
  Administered 2019-07-01: 4 mg via INTRAVENOUS

## 2019-07-01 SURGICAL SUPPLY — 71 items
ADH SKN CLS APL DERMABOND .7 (GAUZE/BANDAGES/DRESSINGS) ×1
APL PRP STRL LF DISP 70% ISPRP (MISCELLANEOUS) ×1
BANDAGE ESMARK 6X9 LF (GAUZE/BANDAGES/DRESSINGS) ×1 IMPLANT
BLADE HEX COATED 2.75 (ELECTRODE) ×3 IMPLANT
BLADE SURG 15 STRL LF DISP TIS (BLADE) ×2 IMPLANT
BLADE SURG 15 STRL SS (BLADE) ×6
BNDG CMPR 9X6 STRL LF SNTH (GAUZE/BANDAGES/DRESSINGS) ×1
BNDG ELASTIC 3X5.8 VLCR STR LF (GAUZE/BANDAGES/DRESSINGS) ×3 IMPLANT
BNDG ELASTIC 4X5.8 VLCR STR LF (GAUZE/BANDAGES/DRESSINGS) ×2 IMPLANT
BNDG ESMARK 6X9 LF (GAUZE/BANDAGES/DRESSINGS) ×3
BNDG GAUZE ELAST 4 BULKY (GAUZE/BANDAGES/DRESSINGS) ×3 IMPLANT
CHLORAPREP W/TINT 26 (MISCELLANEOUS) ×3 IMPLANT
COVER BACK TABLE 60X90IN (DRAPES) ×3 IMPLANT
COVER WAND RF STERILE (DRAPES) ×3 IMPLANT
CUFF TOURN SGL QUICK 18X4 (TOURNIQUET CUFF) ×2 IMPLANT
CUFF TOURN SGL QUICK 24 (TOURNIQUET CUFF)
CUFF TRNQT CYL 24X4X16.5-23 (TOURNIQUET CUFF) IMPLANT
DERMABOND ADVANCED (GAUZE/BANDAGES/DRESSINGS) ×2
DERMABOND ADVANCED .7 DNX12 (GAUZE/BANDAGES/DRESSINGS) IMPLANT
DRAPE 3/4 80X56 (DRAPES) ×2 IMPLANT
DRAPE EXTREMITY T 121X128X90 (DISPOSABLE) ×3 IMPLANT
DRAPE U-SHAPE 47X51 STRL (DRAPES) ×3 IMPLANT
DRSG EMULSION OIL 3X3 NADH (GAUZE/BANDAGES/DRESSINGS) ×3 IMPLANT
DRSG PAD ABDOMINAL 8X10 ST (GAUZE/BANDAGES/DRESSINGS) IMPLANT
ELECT REM PT RETURN 9FT ADLT (ELECTROSURGICAL) ×3
ELECTRODE REM PT RTRN 9FT ADLT (ELECTROSURGICAL) ×1 IMPLANT
GAUZE 4X4 16PLY RFD (DISPOSABLE) ×2 IMPLANT
GAUZE SPONGE 4X4 12PLY STRL (GAUZE/BANDAGES/DRESSINGS) ×3 IMPLANT
GAUZE SPONGE 4X4 12PLY STRL LF (GAUZE/BANDAGES/DRESSINGS) ×3 IMPLANT
GLOVE BIO SURGEON STRL SZ7.5 (GLOVE) ×3 IMPLANT
GLOVE BIOGEL PI IND STRL 8 (GLOVE) ×1 IMPLANT
GLOVE BIOGEL PI INDICATOR 8 (GLOVE) ×2
GOWN STRL REUS W/ TWL XL LVL3 (GOWN DISPOSABLE) ×1 IMPLANT
GOWN STRL REUS W/TWL XL LVL3 (GOWN DISPOSABLE) ×3
HANDPIECE INTERPULSE COAX TIP (DISPOSABLE) ×3
IV NS IRRIG 3000ML ARTHROMATIC (IV SOLUTION) ×2 IMPLANT
LOOP VESSEL MAXI BLUE (MISCELLANEOUS) ×2 IMPLANT
NDL HYPO 25X1 1.5 SAFETY (NEEDLE) IMPLANT
NEEDLE HYPO 25X1 1.5 SAFETY (NEEDLE) ×3 IMPLANT
NERVE PROTECTOR NEURAWRAP 7MM (Tissue) ×3 IMPLANT
NS IRRIG 1000ML POUR BTL (IV SOLUTION) IMPLANT
NS IRRIG 500ML POUR BTL (IV SOLUTION) ×2 IMPLANT
PACK BASIN DAY SURGERY FS (CUSTOM PROCEDURE TRAY) ×3 IMPLANT
PAD CAST 4YDX4 CTTN HI CHSV (CAST SUPPLIES) IMPLANT
PADDING CAST ABS 4INX4YD NS (CAST SUPPLIES) ×2
PADDING CAST ABS COTTON 4X4 ST (CAST SUPPLIES) ×1 IMPLANT
PADDING CAST COTTON 4X4 STRL (CAST SUPPLIES) ×3
PENCIL SMOKE EVACUATOR (MISCELLANEOUS) ×3 IMPLANT
PROTECTOR NERVE NEURAWRAP 10MM (Tissue) ×2 IMPLANT
PROTECTOR NERVE NEURAWRAP 7MM (Tissue) IMPLANT
SET HNDPC FAN SPRY TIP SCT (DISPOSABLE) IMPLANT
SLEEVE SCD COMPRESS KNEE MED (MISCELLANEOUS) ×3 IMPLANT
STOCKINETTE 6  STRL (DRAPES) ×3
STOCKINETTE 6 STRL (DRAPES) ×1 IMPLANT
SUCTION FRAZIER HANDLE 10FR (MISCELLANEOUS) ×3
SUCTION TUBE FRAZIER 10FR DISP (MISCELLANEOUS) IMPLANT
SUT ETHIBOND 2-0 V-5 NDL (SUTURE) ×1 IMPLANT
SUT ETHIBOND 2-0 V-5 NEEDLE (SUTURE) ×3 IMPLANT
SUT ETHILON 3 0 PS 1 (SUTURE) ×3 IMPLANT
SUT MERSILENE 4 0 P 3 (SUTURE) IMPLANT
SUT MON AB 5-0 PS2 18 (SUTURE) ×2 IMPLANT
SUT SILK 3 0 SH 30 (SUTURE) ×3 IMPLANT
SUT VIC AB 0 CT1 36 (SUTURE) IMPLANT
SUT VIC AB 2-0 SH 27 (SUTURE)
SUT VIC AB 2-0 SH 27XBRD (SUTURE) IMPLANT
SUT VIC AB 3-0 FS2 27 (SUTURE) ×3 IMPLANT
SUT VICRYL 4-0 PS2 18IN ABS (SUTURE) IMPLANT
SYR BULB 3OZ (MISCELLANEOUS) ×3 IMPLANT
SYR CONTROL 10ML LL (SYRINGE) ×2 IMPLANT
TOWEL OR 17X26 10 PK STRL BLUE (TOWEL DISPOSABLE) ×3 IMPLANT
UNDERPAD 30X36 HEAVY ABSORB (UNDERPADS AND DIAPERS) ×3 IMPLANT

## 2019-07-01 NOTE — Discharge Instructions (Signed)
Post-Surgery Instructions  1. If you are recuperating from surgery anywhere other than home, please be sure to leave [place] a number where you can be reached. 2. Go directly home and rest. 3. The keep operated foot (or feet) elevated six inches above the hip when sitting or lying down. 4. Support the elevated foot and leg with pillows under the calf. DO NOT PLACE PILLOWS UNDER THE KNEE. 5. DO NOT REMOVE or get your bandages wet. This will increase your chances of getting an infection. 6. Wear your surgical shoe at all times when you are up. 7. A limited amount of pain and swelling may occur. The skin may take on a bruised appearance. This is no cause for alarm. 8. For slight pain and swelling, apply an ice pack directly over the bandage for 15 minutes every hour. Continue icing until seen in the office. DO NOT apply any form of heat to the area. 9. Have prescription(s) filled immediately and take as directed. 10. Drink lots of liquids, water, and juice. 11. CALL THE OFFICE IMMEDIATELY IF: a. Bleeding continues b. Pain increases and/or does not respond to medication c. Bandage or cast appears too tight d. Any liquids (water, coffee, etc.) have spilled on your bandages. e. Tripping, falling, or stubbing the surgical foot f. If your temperature rises above 101 g. If you have ANY questions at all 12. If you are weight-bearing, follow your physician's instructions. You are expected to be: x weight-bearing ? non-weight bearing  14. Your next appointment is on your discharge paperwork  If you need to reach the nurse for any reason, please call: [place]: [phone] [place]: [phone] [place]: [phone]   Post Anesthesia Home Care Instructions  Activity: Get plenty of rest for the remainder of the day. A responsible adult should stay with you for 24 hours following the procedure.  For the next 24 hours, DO NOT: -Drive a car -Paediatric nurse -Drink  alcoholic beverages -Take any medication unless instructed by your physician -Make any legal decisions or sign important papers.  Meals: Start with liquid foods such as gelatin or soup. Progress to regular foods as tolerated. Avoid greasy, spicy, heavy foods. If nausea and/or vomiting occur, drink only clear liquids until the nausea and/or vomiting subsides. Call your physician if vomiting continues.  Special Instructions/Symptoms: Your throat may feel dry or sore from the anesthesia or the breathing tube placed in your throat during surgery. If this causes discomfort, gargle with warm salt water. The discomfort should disappear within 24 hours.  If you had a scopolamine patch placed behind your ear for the management of post- operative nausea and/or vomiting:  1. The medication in the patch is effective for 72 hours, after which it should be removed.  Wrap patch in a tissue and discard in the trash. Wash hands thoroughly with soap and water. 2. You may remove the patch earlier than 72 hours if you experience unpleasant side effects which may include dry mouth, dizziness or visual disturbances. 3. Avoid touching the patch. Wash your hands with soap and water after contact with the patch.

## 2019-07-01 NOTE — Progress Notes (Signed)
Orthopedic Tech Progress Note Patient Details:  Miloh Spilde 1960-05-03 [id number] OR RN took cam walker to [facility] [address].  Patient ID: Bernestine Amass, male   DOB: [birth year], 59 y.o.   MRN: [id number]   Staci Righter 07/01/2019, 1:04 PM

## 2019-07-01 NOTE — Progress Notes (Signed)
Orthopedic Tech Progress Note Patient Details:  [name] February 15, 1961 [id number]  Ortho Devices Type of Ortho Device: CAM walker Ortho Device/Splint Interventions: Ordered       Staci Righter 07/01/2019, 1:03 PM

## 2019-07-01 NOTE — Anesthesia Procedure Notes (Signed)
Procedure Name: Intubation Date/Time: 07/01/2019 11:57 AM Performed by: Mechele Claude, CRNA Pre-anesthesia Checklist: Patient identified, Emergency Drugs available, Suction available and Patient being monitored Patient Re-evaluated:Patient Re-evaluated prior to induction Oxygen Delivery Method: Circle system utilized Preoxygenation: Pre-oxygenation with 100% oxygen Induction Type: IV induction Ventilation: Mask ventilation without difficulty Laryngoscope Size: Mac and 4 Grade View: Grade II Tube type: Oral Tube size: 7.5 mm Number of attempts: 1 Airway Equipment and Method: Stylet and Oral airway Placement Confirmation: ETT inserted through vocal cords under direct vision,  positive ETCO2 and breath sounds checked- equal and bilateral Secured at: 23 cm Tube secured with: Tape Dental Injury: Teeth and Oropharynx as per pre-operative assessment

## 2019-07-01 NOTE — Transfer of Care (Signed)
  Immediate Anesthesia Transfer of Care Note  Patient: [name]  Procedure(s) Performed: Procedure(s) (LRB): Left leg repair/decompression of sural neuroma (Left)  Patient Location: PACU  Anesthesia Type: General  Level of Consciousness: awake, alert  and oriented  Airway & Oxygen Therapy: Patient Spontanous Breathing and Patient connected to face mask oxygen  Post-op Assessment: Report given to PACU RN and Post -op Vital signs reviewed and stable  Post vital signs: Reviewed and stable  Complications: No apparent anesthesia complications  Last Vitals:  Vitals Value Taken Time  BP    Temp    Pulse    Resp    SpO2      Last Pain:  Vitals:   07/01/19 1048  TempSrc: Oral  PainSc: 7       Patients Stated Pain Goal: 2 (07/01/19 1048)

## 2019-07-01 NOTE — Anesthesia Postprocedure Evaluation (Signed)
Anesthesia Post Note  Patient: [name]  Procedure(s) Performed: Left leg repair/decompression of sural neuroma (Left Leg Lower)     Patient location during evaluation: PACU Anesthesia Type: General Level of consciousness: awake and alert and oriented Pain management: pain level controlled Vital Signs Assessment: post-procedure vital signs reviewed and stable Respiratory status: spontaneous breathing, nonlabored ventilation and respiratory function stable Cardiovascular status: blood pressure returned to baseline Postop Assessment: no apparent nausea or vomiting Anesthetic complications: no    Last Vitals:  Vitals:   07/01/19 1400 07/01/19 1415  BP: (!) 158/92 (!) 163/92  Pulse: 79 74  Resp: 17 11  Temp:    SpO2: 100% 100%    Last Pain:  Vitals:   07/01/19 1357  TempSrc:   PainSc: 0-No pain                 Brennan Bailey

## 2019-07-01 NOTE — H&P (Signed)
  Subjective:  Patient ID: Bernestine Amass, male    DOB: 10/23/1960,  MRN: [id number]  Seen in pre-op. Still having pain and inability to sleep. Understands plan for surgery. Objective:   Vitals:   07/01/19 1048  BP: (!) 175/95  Pulse: (!) 18  Resp: (!) 69  Temp: 97.6 F (36.4 C)   General AA&O x3. Normal mood and affect.  Vascular Dorsalis pedis and posterior tibial pulses 2/4 bilat. Brisk capillary refill to all digits. Pedal hair present.  Neurologic Epicritic sensation grossly intact except for sural nerve distribution.  Dermatologic Healed incision  Orthopedic: MMT 5/5 in dorsiflexion, plantarflexion, inversion, and eversion. Normal joint ROM without pain or crepitus.    Assessment & Plan:  Patient was evaluated and treated and all questions answered.  Left Sural Neuroma -Consent form reviewed. All questions answered -Discussed goals of surgery as to decrease pain and possibly restore sensation however no guarantees made. -Proceed to OR for repair/decompression of sural neuroma  [name], DPM  Accessible via secure chat for questions or concerns.

## 2019-07-01 NOTE — Op Note (Signed)
Patient Name: [name] DOB: 06-Feb-1961  MRN: [id number]   Date of Service: 07/01/2019  Surgeon: Dr. Hardie Pulley, DPM Assistants: None Pre-operative Diagnosis:  Sural neuroma Post-operative Diagnosis:  Same Procedures:  1) Decompression of sural neuroma Pathology/Specimens: * No specimens in log * Anesthesia: General Hemostasis:  Total Tourniquet Time Documented: Calf (Left) - 74 minutes Total: Calf (Left) - 74 minutes  Estimated Blood Loss: 20 mL Materials:  Implant Name Type Inv. Item Serial No. Manufacturer Lot No. LRB No. Used Action  Integra NeuraWrap Nerve Protector    [employer] [id number] Left 1 Implanted  Integra NeuraWrap Nerve Protector    [employer] [id number] Left 1 Implanted   Medications: None Complications: none  Indications for Procedure:  This is a 59 y.o. male with neck pain to the sural nerve distribution status post gastrocnemius recession.  Most recently he had an ultrasound performed that shows sural neuroma however it did appear to be intact.  We discussed patient he would benefit from sural nerve decompression possible neuroma repair.  We discussed goal of surgery were to be to decrease pain and possibly restore function though no guarantees were given for either.  Risk and benefits were discussed and patient elected to proceed   Procedure in Detail: Patient was identified in pre-operative holding area. Formal consent was signed and the riight lower extremity was marked. Patient was brought back to the operating room. Anesthesia was induced. The extremity was prepped and draped in the usual sterile fashion. Timeout was taken to confirm patient name, laterality, and procedure prior to incision.   Attention was then directed to the left leg.  An incision was made over the area the previous incision.  The incision was gently bluntly explored.  There did appear to be a significant of scar tissue in this area.  There was a nerve tissue  that had scar tissue proximally and distally that was likely the sural nerve.  Each side was gently bluntly dissected in order to remove adhesions and allow for mobilization of the nerve.  The wound was gently pulse irrigated.  Integra nerve wrap that had been soaking in saline was split and sutured around each part of the nerve bundles with 5-0 Monocryl.  The skin and subtenons tissue were closed with 4-0 Vicryl and 5-0 Monocryl.  Dermabond was applied to the incision.   The foot was then dressed with Adaptic 4 x 4 Kerlix Ace bandage. Patient tolerated the procedure well.   Disposition: Following a period of post-operative monitoring, patient will be transferred home.  There did appear to be scarred areas of likely several nerve that I hope will improve with decompression and gentle nerve wrapping to prevent further adhesions.

## 2019-07-09 ENCOUNTER — Other Ambulatory Visit: Payer: Self-pay

## 2019-07-09 ENCOUNTER — Ambulatory Visit (INDEPENDENT_AMBULATORY_CARE_PROVIDER_SITE_OTHER): Payer: Medicaid Other | Admitting: Podiatry

## 2019-07-09 DIAGNOSIS — G5782 Other specified mononeuropathies of left lower limb: Secondary | ICD-10-CM

## 2019-07-09 DIAGNOSIS — M792 Neuralgia and neuritis, unspecified: Secondary | ICD-10-CM

## 2019-07-09 MED ORDER — PREGABALIN 50 MG PO CAPS: 50.0000 mg | ORAL_CAPSULE | Freq: Two times a day (BID) | ORAL | 0 refills | Status: DC

## 2019-07-09 MED ORDER — OXYCODONE-ACETAMINOPHEN 10-325 MG PO TABS: 1.0000 | ORAL_TABLET | ORAL | 0 refills | Status: DC | PRN

## 2019-07-09 NOTE — Progress Notes (Signed)
  Subjective:  Patient ID: [name], male    DOB: [birth year],  MRN: [id number]  Chief Complaint  Patient presents with  . Routine Post Op    POV#1 DOS 07/01/2019 LT LEG REPAIR/DECOMPRESSION OF SURAL NEUROMA. Pt states he has not been wearing his boot, it is too painful for him. Denies fever/chills/nausea/vomiting.   DOS: 07/01/19 Procedure: Decompression sural neuroma   59 y.o. male presents with the above complaint. History confirmed with patient.   Objective:  Physical Exam: tenderness at the surgical site, local edema noted and calf supple, nontender. Sensation intact at the 4th/5th toe Incision: healing well, no significant drainage, no dehiscence, no significant erythema  Assessment:   1. Sural neuritis, left   2. Neuritis    Plan:  Patient was evaluated and treated and all questions answered.  Post-operative State -Dressing applied consisting of sterile gauze, kerlix and ACE bandage -WBAT in Surgical shoe  -Discussed I do think his nerve systems will regenerate with time. The nerve is intact distally so this likely will regenerate along its course.  No follow-ups on file.

## 2019-07-17 ENCOUNTER — Ambulatory Visit (INDEPENDENT_AMBULATORY_CARE_PROVIDER_SITE_OTHER): Payer: Medicaid Other | Admitting: Podiatry

## 2019-07-17 ENCOUNTER — Other Ambulatory Visit: Payer: Self-pay

## 2019-07-17 DIAGNOSIS — G5782 Other specified mononeuropathies of left lower limb: Secondary | ICD-10-CM

## 2019-07-17 DIAGNOSIS — Z9889 Other specified postprocedural states: Secondary | ICD-10-CM

## 2019-07-17 DIAGNOSIS — M792 Neuralgia and neuritis, unspecified: Secondary | ICD-10-CM

## 2019-07-17 MED ORDER — OXYCODONE-ACETAMINOPHEN 10-325 MG PO TABS: 1.0000 | ORAL_TABLET | ORAL | 0 refills | Status: DC | PRN

## 2019-07-20 ENCOUNTER — Telehealth: Payer: Self-pay | Admitting: *Deleted

## 2019-07-20 DIAGNOSIS — M792 Neuralgia and neuritis, unspecified: Secondary | ICD-10-CM

## 2019-07-20 DIAGNOSIS — G5782 Other specified mononeuropathies of left lower limb: Secondary | ICD-10-CM

## 2019-07-20 DIAGNOSIS — M7989 Other specified soft tissue disorders: Secondary | ICD-10-CM

## 2019-07-20 NOTE — Telephone Encounter (Signed)
Dr. March Rummage ordered [facility] for evaluation and treatment of left leg neuroma.

## 2019-07-21 ENCOUNTER — Other Ambulatory Visit: Payer: Self-pay

## 2019-07-21 ENCOUNTER — Ambulatory Visit: Payer: Medicaid Other | Attending: Podiatry | Admitting: Physical Therapy

## 2019-07-21 ENCOUNTER — Encounter: Payer: Self-pay | Admitting: Physical Therapy

## 2019-07-21 DIAGNOSIS — M25672 Stiffness of left ankle, not elsewhere classified: Secondary | ICD-10-CM | POA: Insufficient documentation

## 2019-07-21 DIAGNOSIS — R2689 Other abnormalities of gait and mobility: Secondary | ICD-10-CM | POA: Diagnosis present

## 2019-07-21 DIAGNOSIS — M25572 Pain in left ankle and joints of left foot: Secondary | ICD-10-CM | POA: Insufficient documentation

## 2019-07-21 NOTE — Therapy (Signed)
[place], [place], [address] Phone: [phone]   Fax:  [phone]  Physical Therapy Evaluation  Patient Details  Name: [name] MRN: [id number] Date of Birth: [birth year] Referring Provider (PT): [name] DPM   Encounter Date: 07/21/2019  PT End of Session - 07/21/19 0952    Visit Number  1    Number of Visits  4    Date for PT Re-Evaluation  08/11/19    Authorization Type  Medicaid eval  1st auth sent in 07-21-19    PT Start Time  0932    PT Stop Time  1015    PT Time Calculation (min)  43 min    Activity Tolerance  Patient tolerated treatment well    Behavior During Therapy  [facility] for tasks assessed/performed       Past Medical History:  Diagnosis Date  . Cardiomyopathy ([place]) 2009   per cardiac cath 12-10-2007 ef 35-40%;    last echo 02-01-2017n ef 55-60%  . COPD (chronic obstructive pulmonary disease) ([place])    previously seen pulmonologist-- dr Melvyn Novas--- GOLD 0-- hx multiple exacerbation's last one 06-09-2018 CAP with ARF (10-20-2018  per pt was followed by pcp and was clear;  pt was on symbicort bid and duoneb as needed per pt had not had it for past 3 months due to no insurance,  pt stated has productive smoker's cough but no sob or difficulty breathing)  . DDD (degenerative disc disease), cervical    ALL OVER  . Diarrhea   . Heel spur, left   . History of nuclear stress test    epic 04-08-2015   low risk w/ small inferior wall scar of mild severity focus of reversible ischemia, ef 61%  . Hypertension    10-20-2018  per pt has not had medication , norvasc, in past 3 months due to no insurance  . Plantar fasciitis, left   . Sleep apnea    never got machine, no sleep study done per pt    Past Surgical History:  Procedure Laterality Date  . ACHILLES TENDON REPAIR Left 2015  . BICEPS TENDON REPAIR Right 2018  . CARDIAC CATHETERIZATION  12-10-2007  dr al little   no evidence  cad, mild to moderate LVD, ef 35-40%  . GASTROC RECESSION EXTREMITY Left 10/22/2018   Procedure: GASTROC RECESSION EXTREMITY;  Surgeon: [name], DPM;  Location: [facility];  Service: Podiatry;  Laterality: Left;  . HEEL SPUR RESECTION Left 10/22/2018   Procedure: HEEL SPUR RESECTION;  Surgeon: [name], DPM;  Location: [place];  Service: Podiatry;  Laterality: Left;  . HIP SURGERY Left 2005 approx.  Marland Kitchen KNEE SURGERY Bilateral x7   last one 2010  approx.  Marland Kitchen NERVE REPAIR Left 07/01/2019   Procedure: Left leg repair/decompression of sural neuroma;  Surgeon: [name], DPM;  Location: [facility];  Service: Podiatry;  Laterality: Left;  PRONE  . PLANTAR FASCIA RELEASE Left 10/22/2018   Procedure: ENDOSCOPIC PLANTAR FASCIOTOMY;  Surgeon: [name], DPM;  Location: [facility];  Service: Podiatry;  Laterality: Left;    There were no vitals filed for this visit.   Subjective Assessment - 07/21/19 0938    Subjective  I have had plantar fasiciitis for 7 years about.  I had  2 surgery (plantar fasciitis gastric recession July 2020, last surgery  and then most recently  07-01-19 nerve repair ( [place])  I have numbness everywhere except my great toe and medial side of foot.  I only feel numbness everywhere else    Pertinent History  COPD, DDD, Sleep apnea    Limitations  Standing;Walking    How long can you sit comfortably?  unlimited    How long can you stand comfortably?  15 minutes    How long can you walk comfortably?  10-15 minutes    Diagnostic tests  MRI, x ray    Patient Stated Goals  to have less pain and to start feeling in foot and I want PT to help break up scar tissue    Currently in Pain?  Yes    Pain Score  5     Pain Location  Heel    Pain Orientation  Left    Pain Descriptors / Indicators  Throbbing    Pain Type  Chronic pain;Surgical pain    Pain Onset  More than a month ago    Pain Frequency   Constant    Aggravating Factors   standing and walking on it    Multiple Pain Sites  Yes    Pain Score  5    Pain Location  Calf    Pain Orientation  Left    Pain Descriptors / Indicators  Sore    Pain Type  Chronic pain;Surgical pain         [facility] PT Assessment - 07/21/19 0001      Assessment   Medical Diagnosis  LT sural neuritis. LT foot pain and numbness    Referring Provider (PT)  [name] DPM    Onset Date/Surgical Date  06/30/19   approximiate   Hand Dominance  Right    Prior Therapy  in the fall for plantar fasccitis surgery 11-2018      Precautions   Precautions  None      Restrictions   Weight Bearing Restrictions  Yes    LLE Weight Bearing  Weight bearing as tolerated      Balance Screen   Has the patient fallen in the past 6 months  No   no falls but feels unsteady   Has the patient had a decrease in activity level because of a fear of falling?   Yes    Is the patient reluctant to leave their home because of a fear of falling?   No      Home Environment   Living Environment  Private residence    Living Arrangements  Alone    Type of [place] Access  Stairs to enter;Level entry    Entrance Stairs-Number of Steps  2    Entrance Stairs-Rails  Left    Home Layout  One level      Prior Function   Level of Independence  Independent      Cognition   Overall Cognitive Status  Within Functional Limits for tasks assessed      Observation/Other Assessments   Focus on Therapeutic Outcomes (FOTO)   MCD NA      Sensation   Light Touch  Impaired by gross assessment    Additional Comments  Pt with numbness over sural nerve branches . only feel great toe and medial arch      AROM   Overall AROM   Deficits    Right Ankle Dorsiflexion  14    Right Ankle Plantar Flexion  50    Right Ankle Inversion  35  Right Ankle Eversion  22    Left Ankle Dorsiflexion  6    Left Ankle Plantar Flexion  40    Left Ankle Inversion  22    Left Ankle  Eversion  10      PROM   Left Ankle Dorsiflexion  10    Left Ankle Plantar Flexion  54    Left Ankle Inversion  27    Left Ankle Eversion  12      Strength   Right Hip Flexion  5/5    Left Hip Flexion  4+/5    Left Ankle Dorsiflexion  3-/5    Left Ankle Plantar Flexion  3-/5   11 x  heel raise poor form   Left Ankle Inversion  3-/5    Left Ankle Eversion  3-/5      Palpation   Palpation comment  Pt with scar tissue and incision on LT lateral gastroc TTP with swelling lateral edge of incision.  Pt with calf tightness       Ambulation/Gait   Ambulation Distance (Feet)  150 Feet    Assistive device  None    Gait Pattern  Decreased stride length;Decreased dorsiflexion - left;Antalgic    Ambulation Surface  Level                Objective measurements completed on examination: See above findings.      [facility] Adult PT Treatment/Exercise - 07/21/19 0001      Ankle Exercises: Stretches   Other Stretch  DF stretch of LT 3 x 30 sec with towel and shown with strap    Other Stretch  IN/EV stretching 3 x each 20 sec             PT Education - 07/21/19 1030    Education Details  POC Explanation of findings intial HEP    Person(s) Educated  Patient    Methods  Explanation;Demonstration;Tactile cues;Verbal cues;Handout    Comprehension  Verbalized understanding;Returned demonstration       PT Short Term Goals - 07/21/19 1014      PT SHORT TERM GOAL #1   Title  Pt will increase AROM to 10 degrees to improve gait and comfort    Baseline  DF LT 6 degrees    Time  3    Period  Weeks    Status  New    Target Date  08/11/19      PT SHORT TERM GOAL #2   Title  Patient wil increase gross L ankle strength to 4-/5    Baseline  3-/5 LT AROM ankle motions    Time  3    Period  Weeks    Status  New    Target Date  08/11/19      PT SHORT TERM GOAL #3   Title  Patient will report a decrease in numbness and radicular pain laterally on the left ankle by 25%    Baseline   numb and painful 5/10 at minimum and constant    Time  3    Period  Weeks    Status  New    Target Date  08/11/19        PT Long Term Goals - 07/21/19 1621      PT LONG TERM GOAL #1   Title  TBD at ERO if necesssary to continue RX after goal evaluation             Plan - 07/21/19 1612    Clinical  Impression Statement  59 yo male presents to clinic wiht hx the gastroc recession / plantar fasciotomy and then subsequent  sural neuroma neurolysis for removal of neuroma.  Mr Luellen presents with LT numbness over lateral foot with feeling only over Great toe and medical arch.  Pt with concerns about not feeling his foot and tightness in his LT calf muscles.  DF LT 6 and PROM 10, and calf tightness impedes his abilityt to walk. 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   Pt will benefit from skilled PT to address impairments and strength his LE    Comorbidities  right quad tendon rupture, right knee pain. left plantar facia release, left hip surgery    Examination-Participation Restrictions  Community Activity;Laundry;Meal Prep;Shop    Stability/Clinical Decision Making  Evolving/Moderate complexity    Clinical Decision Making  Moderate    Rehab Potential  Good    PT Frequency  2x / week    PT Duration  8 weeks    PT Treatment/Interventions  ADLs/Self Care Home Management;Cryotherapy;Electrical Stimulation;Iontophoresis 4mg /ml Dexamethasone;Moist Heat;Ultrasound;DME Instruction;Stair training;Gait training;Functional mobility training;Therapeutic activities;Therapeutic exercise;Neuromuscular re-education;Patient/family education;Manual techniques;Passive range of motion;Dry needling;Taping    PT Next Visit Plan  TPDN for for calf tightness, progress HEP as tolerated, scar tissue massage    PT Home Exercise Plan  DF stretch with towel and EV /IN stretching     Consulted and Agree with Plan of Care  Patient       Patient will benefit from skilled therapeutic intervention in order to improve the following deficits and impairments:  Abnormal gait, Decreased activity tolerance, Decreased strength, Obesity, Decreased endurance, Decreased mobility, Decreased balance, Pain, Impaired sensation, Increased fascial restricitons  Visit Diagnosis: Pain in left ankle and joints of left foot  Other abnormalities of gait and mobility  Stiffness of left ankle, not elsewhere classified     Problem List Patient Active Problem List   Diagnosis Date Noted  . COPD (chronic obstructive pulmonary disease) ([place]) 04/24/2019  . Plantar fasciitis   . Heel spur, left   . Acquired equinus deformity of left foot   . Acute respiratory failure with hypoxia ([place]) 06/10/2018  . COPD with acute exacerbation ([place]) 06/10/2018  . Community acquired bilateral lower lobe pneumonia 06/10/2018  . Achilles tendinitis, right leg 04/24/2018  . Acute respiratory distress 12/20/2017  . History of noncompliance with medical treatment 12/20/2017  . Acute sinusitis, unspecified 03/15/2017  . Rupture of right distal biceps tendon 03/01/2016  . Pain in right lower leg 01/30/2016  . Chest pain 04/26/2015  . Hypokalemia 04/26/2015  . COPD GOLD 0 / active smoker 04/26/2015  . Pain in the chest   . Fall 07/22/2014  . Multiple fractures of ribs of right side 07/22/2014  . Pneumothorax on right 07/22/2014  . Traumatic pneumohemothorax 07/21/2014  . Altered mental state 04/15/2013  . Acute encephalopathy 04/15/2013  . Difficulty speaking 04/15/2013  . Headache(784.0) 04/15/2013  . Cigarette smoker 04/15/2013  . HTN (hypertension) 04/15/2013   Voncille Lo, PT Certified Exercise Expert for the Aging Adult  07/21/19 4:42 PM Phone: [phone] Fax: [place] [facility] [address] [place], [place], [address] Phone:  [phone]   Fax:  [phone]  Name: Orbin Beitel MRN: [id number] Date of Birth: [birth year]

## 2019-07-21 NOTE — Patient Instructions (Signed)
Gastroc / Heel Cord Stretch - Seated With Towel   Sit on floor, towel around ball of foot. Gently pull foot in toward body, stretching heel cord and calf. Hold for 20-30 ___ seconds. Repeat on involved leg. Repeat _3__ times. Do __3_ times per day.  Copyright  VHI. All rights reserved.   Ankle Inversion / Eversion, Sitting   Sit and grasp bottom of one foot. Bend ankle by moving foot inward and outward. Hold each position _10__ seconds. Repeat __5_ times per session. Do _2-3__ sessions per day.  Voncille Lo, PT Certified Exercise Expert for the Aging Adult  07/21/19 10:13 AM Phone: [phone] Fax: [phone]

## 2019-07-24 ENCOUNTER — Telehealth: Payer: Self-pay | Admitting: *Deleted

## 2019-07-24 MED ORDER — OXYCODONE-ACETAMINOPHEN 10-325 MG PO TABS: 1.0000 | ORAL_TABLET | ORAL | 0 refills | Status: DC | PRN

## 2019-07-24 NOTE — Telephone Encounter (Signed)
Patient is requesting a refill of Oxycodone-Acetaminophen(Percocet) 10-325 mg-4 times daily prn,only has 1 pill remaining for Neuroma of leg, "currently taking PT and It's killing me"

## 2019-07-24 NOTE — Addendum Note (Signed)
Addended by: Boneta Lucks on: 07/24/2019 03:35 PM   Modules accepted: Orders

## 2019-07-27 MED ORDER — OXYCODONE-ACETAMINOPHEN 10-325 MG PO TABS: 1.0000 | ORAL_TABLET | ORAL | 0 refills | Status: DC | PRN

## 2019-07-27 NOTE — Addendum Note (Signed)
Addended by: Hardie Pulley on: 07/27/2019 12:29 PM   Modules accepted: Orders

## 2019-07-27 NOTE — Telephone Encounter (Signed)
Pt called states the woman he spoke with stated she was going to send his request to the doctor on call but he did not get any pain medication and has not slept all weekend and the leg is killing him.

## 2019-07-27 NOTE — Telephone Encounter (Signed)
Left message informing pt Dr. March Rummage had called in the pain medication today at 12:29pm and I was sorry the message had not been sent by the person taking the message on Friday, and asked if he needed a refill of the Lyrica, I could call that in for him.

## 2019-07-31 ENCOUNTER — Ambulatory Visit (INDEPENDENT_AMBULATORY_CARE_PROVIDER_SITE_OTHER): Payer: Medicaid Other | Admitting: Podiatry

## 2019-07-31 DIAGNOSIS — Z5329 Procedure and treatment not carried out because of patient's decision for other reasons: Secondary | ICD-10-CM

## 2019-07-31 NOTE — Progress Notes (Signed)
No show for appt. 

## 2019-08-04 ENCOUNTER — Encounter: Payer: Self-pay | Admitting: Physical Therapy

## 2019-08-04 ENCOUNTER — Ambulatory Visit: Payer: Medicaid Other | Attending: Podiatry | Admitting: Physical Therapy

## 2019-08-04 ENCOUNTER — Other Ambulatory Visit: Payer: Self-pay

## 2019-08-04 DIAGNOSIS — M25572 Pain in left ankle and joints of left foot: Secondary | ICD-10-CM | POA: Insufficient documentation

## 2019-08-04 DIAGNOSIS — M25672 Stiffness of left ankle, not elsewhere classified: Secondary | ICD-10-CM | POA: Diagnosis present

## 2019-08-04 DIAGNOSIS — R2689 Other abnormalities of gait and mobility: Secondary | ICD-10-CM | POA: Insufficient documentation

## 2019-08-04 NOTE — Therapy (Addendum)
[place], [place], [address] Phone: [phone]   Fax:  [phone]  Physical Therapy Treatment/Discharge Note  Patient Details  Name: [name] MRN: [id number] Date of Birth: 09-20-1960 Referring Provider (PT): [name] DPM   Encounter Date: 08/04/2019  PT End of Session - 08/04/19 1030    Visit Number  2    Number of Visits  4    Date for PT Re-Evaluation  08/11/19    Authorization Type  Medicaid eval  1st auth sent in 07-21-19    PT Start Time  1030   late and went to bathroom   PT Stop Time  1110    PT Time Calculation (min)  40 min    Activity Tolerance  Patient tolerated treatment well    Behavior During Therapy  [facility] for tasks assessed/performed       Past Medical History:  Diagnosis Date  . Cardiomyopathy ([place]) 2009   per cardiac cath 12-10-2007 ef 35-40%;    last echo 02-01-2017n ef 55-60%  . COPD (chronic obstructive pulmonary disease) ([place])    previously seen pulmonologist-- dr Melvyn Novas--- GOLD 0-- hx multiple exacerbation's last one 06-09-2018 CAP with ARF (10-20-2018  per pt was followed by pcp and was clear;  pt was on symbicort bid and duoneb as needed per pt had not had it for past 3 months due to no insurance,  pt stated has productive smoker's cough but no sob or difficulty breathing)  . DDD (degenerative disc disease), cervical    ALL OVER  . Diarrhea   . Heel spur, left   . History of nuclear stress test    epic 04-08-2015   low risk w/ small inferior wall scar of mild severity focus of reversible ischemia, ef 61%  . Hypertension    10-20-2018  per pt has not had medication , norvasc, in past 3 months due to no insurance  . Plantar fasciitis, left   . Sleep apnea    never got machine, no sleep study done per pt    Past Surgical History:  Procedure Laterality Date  . ACHILLES TENDON REPAIR Left 2015  . BICEPS TENDON REPAIR Right 2018  . CARDIAC CATHETERIZATION   12-10-2007  dr al little   no evidence cad, mild to moderate LVD, ef 35-40%  . GASTROC RECESSION EXTREMITY Left 10/22/2018   Procedure: GASTROC RECESSION EXTREMITY;  Surgeon: [name], DPM;  Location: [facility];  Service: Podiatry;  Laterality: Left;  . HEEL SPUR RESECTION Left 10/22/2018   Procedure: HEEL SPUR RESECTION;  Surgeon: [name], DPM;  Location: [place];  Service: Podiatry;  Laterality: Left;  . HIP SURGERY Left 2005 approx.  Marland Kitchen KNEE SURGERY Bilateral x7   last one 2010  approx.  Marland Kitchen NERVE REPAIR Left 07/01/2019   Procedure: Left leg repair/decompression of sural neuroma;  Surgeon: [name], DPM;  Location: [facility];  Service: Podiatry;  Laterality: Left;  PRONE  . PLANTAR FASCIA RELEASE Left 10/22/2018   Procedure: ENDOSCOPIC PLANTAR FASCIOTOMY;  Surgeon: [name], DPM;  Location: [place];  Service: Podiatry;  Laterality: Left;    There were no vitals filed for this visit.  Subjective Assessment - 08/04/19 1031    Subjective  I am about a 4/10 in pain  no difference since last time    Pertinent History  COPD, DDD, Sleep apnea    Limitations  Standing;Walking  Patient Stated Goals  to have less pain and to start feeling in foot and I want PT to help break up scar tissue    Currently in Pain?  Yes    Pain Score  4     Pain Location  Foot    Pain Orientation  Left    Pain Descriptors / Indicators  Throbbing    Pain Type  Chronic pain    Pain Onset  More than a month ago    Pain Score  5    Pain Location  Calf    Pain Orientation  Left    Pain Descriptors / Indicators  Sore         [facility] PT Assessment - 08/04/19 0001      AROM   Left Ankle Dorsiflexion  9                   [facility] Adult PT Treatment/Exercise - 08/04/19 0001      Modalities   Modalities  Moist Heat      Moist Heat Therapy   Number Minutes Moist Heat  15 Minutes    Moist Heat Location   Ankle;Other (comment)      Manual Therapy   Manual Therapy  Soft tissue mobilization;Taping   gastroc LT   Manual therapy comments  skilled palpation with TPDN    Soft tissue mobilization  gastroc, soleus   scartissue massage   Kinesiotex  Create Space      Kinesiotix   Create Space  fat pad taping LT       Ankle Exercises: Standing   Other Standing Ankle Exercises  bil heel raise x 20 singe limb LT x 10 x 2      Ankle Exercises: Stretches   Other Stretch  DF stretch of LT 3 x 30 sec with towel and shown with strap    Other Stretch  IN/EV stretching 3 x each 20 sec       Trigger Point Dry Needling - 08/04/19 0001    Consent Given?  Yes    Education Handout Provided  Yes    Muscles Treated Lower Quadrant  Quadratus plantae;Gastrocnemius;Soleus   LT only   Other Dry Needling  scar tissue TPDN LT incision    Quadatus plantae response  Twitch response elicited;Palpable increased muscle length    Gastrocnemius Response  Palpable increased muscle length    Soleus Response  Palpable increased muscle length             PT Short Term Goals - 07/21/19 1014      PT SHORT TERM GOAL #1   Title  Pt will increase AROM to 10 degrees to improve gait and comfort    Baseline  DF LT 6 degrees    Time  3    Period  Weeks    Status  New    [place] Date  08/11/19      PT SHORT TERM GOAL #2   Title  Patient wil increase gross L ankle strength to 4-/5    Baseline  3-/5 LT AROM ankle motions    Time  3    Period  Weeks    Status  New    Target Date  08/11/19      PT SHORT TERM GOAL #3   Title  Patient will report a decrease in numbness and radicular pain laterally on the left ankle by 25%    Baseline  numb and painful 5/10 at minimum and  constant    Time  3    Period  Weeks    Status  New    Target Date  08/11/19        PT Long Term Goals - 07/21/19 1621      PT LONG TERM GOAL #1   Title  TBD at ERO if necesssary to continue RX after goal evaluation             Plan - 08/04/19 1103    Clinical Impression Statement  Pt enters clinic with 6/10 pain in LT foot heel and 4/10 in LT gastroc.  Pt sutures not removed yet but healing well.  Pt consents to TPDN for LT Gastroc soleus and plantar fasciitis.  Pt is closely monitored throughout session followed by exercises and stretching.  Pt then was taped using fat pad taping technique. Pt has schedule appt with MD tomorrow and has two more appt before re evaluation and reauthoriaztion for MCD  DF LT improved to 9 degrees.on LT    Comorbidities  right quad tendon rupture, right knee pain. left plantar facia release, left hip surgery    Examination-Participation Restrictions  Community Activity;Laundry;Meal Prep;Shop    PT Treatment/Interventions  ADLs/Self Care Home Management;Cryotherapy;Electrical Stimulation;Iontophoresis 18m/ml Dexamethasone;Moist Heat;Ultrasound;DME Instruction;Stair training;Gait training;Functional mobility training;Therapeutic activities;Therapeutic exercise;Neuromuscular re-education;Patient/family education;Manual techniques;Passive range of motion;Dry needling;Taping       Patient will benefit from skilled therapeutic intervention in order to improve the following deficits and impairments:  Abnormal gait, Decreased activity tolerance, Decreased strength, Obesity, Decreased endurance, Decreased mobility, Decreased balance, Pain, Impaired sensation, Increased fascial restricitons  Visit Diagnosis: Pain in left ankle and joints of left foot  Other abnormalities of gait and mobility  Stiffness of left ankle, not elsewhere classified     Problem List Patient Active Problem List   Diagnosis Date Noted  . COPD [place] obstructive pulmonary disease) ([place] 04/24/[place]  . Plantar fasciitis   . Heel spur, left   . Acquired equinus deformity of left foot   . [place] respiratory [place] 06/10/2018  . COPD with acute exacerbation ([place] 06/10/2018  . Community  acquired bilateral lower lobe pneumonia 06/10/2018  . Achilles tendinitis, right leg 04/24/2018  . Acute respiratory distress 12/20/2017  . History of noncompliance with medical treatment 12/20/2017  . Acute sinusitis, unspecified 03/15/2017  . Rupture of right distal biceps tendon 03/01/2016  . Pain in right lower leg 01/30/2016  . Chest pain 04/26/2015  . Hypokalemia 04/26/2015  . COPD GOLD 0 / active smoker 04/26/2015  . Pain in the chest   . Fall 07/22/2014  . Multiple fractures of ribs of right side 07/22/2014  . Pneumothorax on right 07/22/2014  . Traumatic pneumohemothorax 07/21/2014  . Altered mental state 04/15/2013  . Acute encephalopathy 04/15/2013  . Difficulty speaking 04/15/2013  . Headache(784.0) 04/15/2013  . Cigarette smoker 04/15/2013  . HTN (hypertension) 04/15/2013   LVoncille Lo PT Certified Exercise Expert for the Aging Adult  08/04/19 1:05 PM Phone: [phone]: 3[facility]GOak [address] [place] [address]Phone: [phone]  Fax:  [phone] Name: DGabriella GuileMRN: [id number] of Birth: 1May 09, 1962 PHYSICAL THERAPY DISCHARGE SUMMARY  Visits from Start of Care: 2  Current functional level related to goals / functional outcomes: unknown   Remaining deficits: Unknown  [facility]  Education / Equipment: Initial HEP Plan:  Patient goals were not met. Patient is being discharged due to not returning since the last visit.  ?????    Voncille Lo, PT Certified Exercise Expert for the Aging Adult  11/12/19 2:03 PM Phone: [phone] Fax: [phone]

## 2019-08-04 NOTE — Patient Instructions (Signed)
Trigger Point Dry Needling  . What is Trigger Point Dry Needling (DN)? o DN is a physical therapy technique used to treat muscle pain and dysfunction. Specifically, DN helps deactivate muscle trigger points (muscle knots).  o A thin filiform needle is used to penetrate the skin and stimulate the underlying trigger point. The goal is for a local twitch response (LTR) to occur and for the trigger point to relax. No medication of any kind is injected during the procedure.   . What Does Trigger Point Dry Needling Feel Like?  o The procedure feels different for each individual patient. Some patients report that they do not actually feel the needle enter the skin and overall the process is not painful. Very mild bleeding may occur. However, many patients feel a deep cramping in the muscle in which the needle was inserted. This is the local twitch response.   Marland Kitchen How Will I feel after the treatment? o Soreness is normal, and the onset of soreness may not occur for a few hours. Typically this soreness does not last longer than two days.  o Bruising is uncommon, however; ice can be used to decrease any possible bruising.  o In rare cases feeling tired or nauseous after the treatment is normal. In addition, your symptoms may get worse before they get better, this period will typically not last longer than 24 hours.   . What Can I do After My Treatment? o Increase your hydration by drinking more water for the next 24 hours. o You may place ice or heat on the areas treated that have become sore, however, do not use heat on inflamed or bruised areas. Heat often brings more relief post needling. o You can continue your regular activities, but vigorous activity is not recommended initially after the treatment for 24 hours. o DN is best combined with other physical therapy such as strengthening, stretching, and other therapies.    Voncille Lo, PT Certified Exercise Expert for the Aging Adult  08/04/19  10:33 AM Phone: [phone] Fax: [phone]

## 2019-08-05 ENCOUNTER — Ambulatory Visit (INDEPENDENT_AMBULATORY_CARE_PROVIDER_SITE_OTHER): Payer: Medicaid Other | Admitting: Podiatrist

## 2019-08-05 ENCOUNTER — Encounter: Payer: Self-pay | Admitting: Podiatrist

## 2019-08-05 VITALS — Temp 97.1°F

## 2019-08-05 DIAGNOSIS — Z9889 Other specified postprocedural states: Secondary | ICD-10-CM

## 2019-08-05 DIAGNOSIS — G5782 Other specified mononeuropathies of left lower limb: Secondary | ICD-10-CM

## 2019-08-05 MED ORDER — OXYCODONE-ACETAMINOPHEN 10-325 MG PO TABS: 1.0000 | ORAL_TABLET | ORAL | 0 refills | Status: DC | PRN

## 2019-08-05 NOTE — Progress Notes (Signed)
    Chief Complaint  Patient presents with  . Routine Post Op    L. Pt stated, "A lot of pain - I had to return to work. 8/10. PT taped up my leg yesterday. They did something that seemed like acupuncture - it was painful".     Subjective: Patient presents today1 month status post foot surgery of the left sural nerve (patient states DOS 07/01/19).   Patient denies nausea, vomiting, fevers, chills or night sweats.  Relates he still in a lot of pain.  States he has no feeling on the back of the leg and pain on the bottom of the heel.  Has been going to physical therapy.  He presents today with a taping going up the back of his leg covering the incision site.  Objective:  vascular status is intact with palpable pedal pulses DP and PT bilateral at 2+ out of 4. Neurological sensation is absent on the posterior leg left with subjective pain plantarly.  Excellent appearance of the incision site is noted.  Sutures have been removed.  Mild swelling and scar tissue is present on the lateral aspect of the incision.    Assessment: Status post left sural nerve decompression  Plan: Recommended continue with physical therapy.  I refilled his pain medication and recommended he follow-up with Dr. March Rummage in 1 week for further treatment.  Also mention the possibility of a TENS unit being beneficial and he will speak with his physical therapist about this.

## 2019-08-05 NOTE — Patient Instructions (Signed)
Ask your physical therapist about if a TENS unit would be helpful at all-  You can find them at [employer] (usually)  Or [facility] has lots to choose from.

## 2019-08-06 ENCOUNTER — Telehealth: Payer: Self-pay | Admitting: Physical Therapy

## 2019-08-06 ENCOUNTER — Ambulatory Visit: Payer: Medicaid Other | Admitting: Physical Therapy

## 2019-08-06 NOTE — Telephone Encounter (Signed)
Attempted to call regarding missed appointment today but there was no answer and mailbox was full unable to leave message.   Kristoffer Leamon PT, DPT, LAT, ATC  08/06/19  11:47 AM

## 2019-08-10 ENCOUNTER — Ambulatory Visit: Payer: Medicaid Other | Admitting: Physical Therapy

## 2019-08-11 ENCOUNTER — Telehealth: Payer: Self-pay | Admitting: Physical Therapy

## 2019-08-11 NOTE — Telephone Encounter (Signed)
Pt did not show today for appt.  Pt has only attended eval and 1 visit and has no showed for 2 visit.  No other appt are scheduled.   Pt called to contact but mailbox is full and unable to leave message.  Voncille Lo, PT Certified Exercise Expert for the Aging Adult  08/11/19 12:12 PM Phone: [phone] Fax: [phone]

## 2019-08-13 ENCOUNTER — Telehealth: Payer: Self-pay | Admitting: *Deleted

## 2019-08-13 DIAGNOSIS — G5782 Other specified mononeuropathies of left lower limb: Secondary | ICD-10-CM

## 2019-08-13 DIAGNOSIS — Z9889 Other specified postprocedural states: Secondary | ICD-10-CM

## 2019-08-13 NOTE — Telephone Encounter (Signed)
Pt states he is totally out to the pain medication and has made it last as long as possible but is taking PT and that continues to cause pain.

## 2019-08-17 MED ORDER — PREGABALIN 50 MG PO CAPS: 50.0000 mg | ORAL_CAPSULE | Freq: Two times a day (BID) | ORAL | 0 refills | Status: DC

## 2019-08-17 MED ORDER — OXYCODONE-ACETAMINOPHEN 10-325 MG PO TABS: 1.0000 | ORAL_TABLET | ORAL | 0 refills | Status: DC | PRN

## 2019-08-20 ENCOUNTER — Ambulatory Visit (INDEPENDENT_AMBULATORY_CARE_PROVIDER_SITE_OTHER): Payer: Medicaid Other | Admitting: Podiatry

## 2019-08-20 DIAGNOSIS — Z5329 Procedure and treatment not carried out because of patient's decision for other reasons: Secondary | ICD-10-CM

## 2019-08-20 NOTE — Progress Notes (Signed)
No show for post-op appointment. 

## 2019-08-21 ENCOUNTER — Emergency Department (HOSPITAL_COMMUNITY)
Admission: EM | Admit: 2019-08-21 | Discharge: 2019-08-21 | Disposition: A | Discharge status: Home / Self Care | Payer: Medicaid Other | Attending: Emergency Medicine | Admitting: Emergency Medicine

## 2019-08-21 ENCOUNTER — Encounter (HOSPITAL_COMMUNITY): Payer: Self-pay | Admitting: Emergency Medicine

## 2019-08-21 ENCOUNTER — Other Ambulatory Visit: Payer: Self-pay

## 2019-08-21 DIAGNOSIS — J449 Chronic obstructive pulmonary disease, unspecified: Secondary | ICD-10-CM | POA: Insufficient documentation

## 2019-08-21 DIAGNOSIS — S61227A Laceration with foreign body of left little finger without damage to nail, initial encounter: Secondary | ICD-10-CM

## 2019-08-21 DIAGNOSIS — Y929 Unspecified place or not applicable: Secondary | ICD-10-CM | POA: Diagnosis not present

## 2019-08-21 DIAGNOSIS — Y939 Activity, unspecified: Secondary | ICD-10-CM | POA: Insufficient documentation

## 2019-08-21 DIAGNOSIS — I10 Essential (primary) hypertension: Secondary | ICD-10-CM | POA: Diagnosis not present

## 2019-08-21 DIAGNOSIS — W458XXA Other foreign body or object entering through skin, initial encounter: Secondary | ICD-10-CM | POA: Insufficient documentation

## 2019-08-21 DIAGNOSIS — F17210 Nicotine dependence, cigarettes, uncomplicated: Secondary | ICD-10-CM | POA: Insufficient documentation

## 2019-08-21 DIAGNOSIS — Y999 Unspecified external cause status: Secondary | ICD-10-CM | POA: Insufficient documentation

## 2019-08-21 DIAGNOSIS — Z79899 Other long term (current) drug therapy: Secondary | ICD-10-CM | POA: Insufficient documentation

## 2019-08-21 MED ORDER — TETANUS-DIPHTH-ACELL PERTUSSIS 5-2.5-18.5 LF-MCG/0.5 IM SUSP: 0.5000 mL | Freq: Once | INTRAMUSCULAR | Status: DC

## 2019-08-21 MED ORDER — LIDOCAINE-EPINEPHRINE 2 %-1:100000 IJ SOLN
20.0000 mL | Freq: Once | INTRAMUSCULAR | Status: AC
  Administered 2019-08-21: 20 mL via INTRADERMAL
  Filled 2019-08-21: qty 1

## 2019-08-21 NOTE — ED Triage Notes (Signed)
Pt cut left hand on rusty muffler.

## 2019-08-21 NOTE — ED Notes (Signed)
Pt left prior to updated VS and discharge instructions. Staff tried to find pt, unable to find him.

## 2019-08-21 NOTE — ED Provider Notes (Signed)
[facility] DEPT Provider Note   CSN: [id number] Arrival date & time: 08/21/19  1402     History Chief Complaint  Patient presents with  . Extremity Laceration    [name] is a 59 y.o. male     Laceration Location:  Hand Hand laceration location:  L hand Length:  2 Depth:  Cutaneous Quality: straight   Bleeding: controlled   Time since incident:  3 hours Laceration mechanism:  Metal edge Pain details:    Quality:  Aching   Severity:  Moderate   Timing:  Constant   Progression:  Improving Foreign body present:  No foreign bodies Relieved by:  None tried Worsened by:  Movement and pressure Tetanus status:  Up to date Associated symptoms: no fever, no focal weakness, no numbness, no rash, no redness, no swelling and no streaking        Past Medical History:  Diagnosis Date  . Cardiomyopathy ([place]) 2009   per cardiac cath 12-10-2007 ef 35-40%;    last echo 02-01-2017n ef 55-60%  . COPD (chronic obstructive pulmonary disease) ([place])    previously seen pulmonologist-- dr Melvyn Novas--- GOLD 0-- hx multiple exacerbation's last one 06-09-2018 CAP with ARF (10-20-2018  per pt was followed by pcp and was clear;  pt was on symbicort bid and duoneb as needed per pt had not had it for past 3 months due to no insurance,  pt stated has productive smoker's cough but no sob or difficulty breathing)  . DDD (degenerative disc disease), cervical    ALL OVER  . Diarrhea   . Heel spur, left   . History of nuclear stress test    epic 04-08-2015   low risk w/ small inferior wall scar of mild severity focus of reversible ischemia, ef 61%  . Hypertension    10-20-2018  per pt has not had medication , norvasc, in past 3 months due to no insurance  . Plantar fasciitis, left   . Sleep apnea    never got machine, no sleep study done per pt    Patient Active Problem List   Diagnosis Date Noted  . COPD (chronic obstructive pulmonary disease) ([place])  04/24/2019  . Plantar fasciitis   . Heel spur, left   . Acquired equinus deformity of left foot   . Acute respiratory failure with hypoxia ([place]) 06/10/2018  . COPD with acute exacerbation ([place]) 06/10/2018  . Community acquired bilateral lower lobe pneumonia 06/10/2018  . Achilles tendinitis, right leg 04/24/2018  . Acute respiratory distress 12/20/2017  . History of noncompliance with medical treatment 12/20/2017  . Acute sinusitis, unspecified 03/15/2017  . Rupture of right distal biceps tendon 03/01/2016  . Pain in right lower leg 01/30/2016  . Chest pain 04/26/2015  . Hypokalemia 04/26/2015  . COPD GOLD 0 / active smoker 04/26/2015  . Pain in the chest   . Fall 07/22/2014  . Multiple fractures of ribs of right side 07/22/2014  . Pneumothorax on right 07/22/2014  . Traumatic pneumohemothorax 07/21/2014  . Altered mental state 04/15/2013  . Acute encephalopathy 04/15/2013  . Difficulty speaking 04/15/2013  . Headache(784.0) 04/15/2013  . Cigarette smoker 04/15/2013  . HTN (hypertension) 04/15/2013    Past Surgical History:  Procedure Laterality Date  . ACHILLES TENDON REPAIR Left 2015  . BICEPS TENDON REPAIR Right 2018  . CARDIAC CATHETERIZATION  12-10-2007  dr al little   no evidence cad, mild to moderate LVD, ef 35-40%  . GASTROC RECESSION EXTREMITY Left 10/22/2018  Procedure: GASTROC RECESSION EXTREMITY;  Surgeon: [name], DPM;  Location[facility];  Service: Podiatry;  Laterality: Left;  . HEEL SPUR RESECTION Left 10/22/2018   Procedure: HEEL SPUR RESECTION;  Surgeon: [name], DPM;  Location: [facility];  Service: Podiatry;  Laterality: Left;  . HIP SURGERY Left 2005 approx.  Marland Kitchen KNEE SURGERY Bilateral x7   last one 2010  approx.  Marland Kitchen NERVE REPAIR Left 07/01/2019   Procedure: Left leg repair/decompression of sural neuroma;  Surgeon: [name], DPM;  Location: [facility];  Service: Podiatry;  Laterality:  Left;  PRONE  . PLANTAR FASCIA RELEASE Left 10/22/2018   Procedure: ENDOSCOPIC PLANTAR FASCIOTOMY;  Surgeon: [name], DPM;  Location: [facility];  Service: Podiatry;  Laterality: Left;       Family History  Problem Relation Age of Onset  . CAD Father   . Cancer Father   . Bone cancer Mother   . Colon cancer Neg Hx   . Colon polyps Neg Hx   . Esophageal cancer Neg Hx   . Rectal cancer Neg Hx   . Stomach cancer Neg Hx     Social History   Tobacco Use  . Smoking status: Current Every Day Smoker    Packs/day: 0.75    Years: 28.00    Pack years: 21.00    Types: Cigarettes  . Smokeless tobacco: Never Used  Substance Use Topics  . Alcohol use: Yes    Comment: on occasion  . Drug use: Never    Home Medications Prior to Admission medications   Medication Sig Start Date End Date Taking? Authorizing Provider  albuterol (VENTOLIN HFA) 108 (90 Base) MCG/ACT inhaler Inhale 2 puffs into the lungs every 4 (four) hours as needed for wheezing or shortness of breath. 04/25/19   Nita Sells, MD  amLODipine (NORVASC) 5 MG tablet Take 1 tablet (5 mg total) by mouth daily for 30 days. 06/14/18 05/18/19  Kayleen Memos, DO  ipratropium-albuterol (DUONEB) 0.5-2.5 (3) MG/3ML SOLN Take 3 mLs by nebulization 3 (three) times daily. Patient taking differently: Take 3 mLs by nebulization daily.  04/25/19   Nita Sells, MD  oxyCODONE-acetaminophen (PERCOCET) 10-325 MG tablet Take 1 tablet by mouth every 4 (four) hours as needed for pain. 08/17/19   [name], DPM  pregabalin (LYRICA) 50 MG capsule Take 1 capsule (50 mg total) by mouth 2 (two) times daily. 08/17/19   [name], DPM  sildenafil (REVATIO) 20 MG tablet Take 20-40 mg by mouth as needed (before sexual activity).  10/31/18   [provider]  SYMBICORT 160-4.5 MCG/ACT inhaler Inhale 2 puffs into the lungs 2 (two) times daily. 04/25/19   Nita Sells, MD    triamterene-hydrochlorothiazide (DYAZIDE) 37.5-25 MG capsule Take 1 capsule by mouth daily. 04/22/19   [provider]  zolpidem (AMBIEN) 5 MG tablet Take 1 tablet (5 mg total) by mouth at bedtime as needed for sleep. 04/02/19   [name], DPM    Allergies    Patient has no known allergies.  Review of Systems   Review of Systems  Constitutional: Negative for fever.  Skin: Negative for rash and wound.  Neurological: Negative for focal weakness.    Physical Exam Updated Vital Signs BP (!) 148/96   Pulse 65   Temp 98.1 F (36.7 C)   SpO2 97%   Physical Exam Vitals and nursing note reviewed.  Constitutional:  General: He is not in acute distress.    Appearance: He is well-developed. He is not diaphoretic.  HENT:     Head: Normocephalic and atraumatic.  Eyes:     General: No scleral icterus.    Conjunctiva/sclera: Conjunctivae normal.  Cardiovascular:     Rate and Rhythm: Normal rate and regular rhythm.     Heart sounds: Normal heart sounds.  Pulmonary:     Effort: Pulmonary effort is normal. No respiratory distress.     Breath sounds: Normal breath sounds.  Abdominal:     Palpations: Abdomen is soft.     Tenderness: There is no abdominal tenderness.  Musculoskeletal:     Cervical back: Normal range of motion and neck supple.     Comments: 2 cm laceration over the proximal left pinky at the webbing.  Full strength and range of motion of the joint, no numbness or tingling.  No obvious tendon lacerations.  Skin:    General: Skin is warm and dry.  Neurological:     Mental Status: He is alert.  Psychiatric:        Behavior: Behavior normal.     ED Results / Procedures / Treatments   Labs (all labs ordered are listed, but only abnormal results are displayed) Labs Reviewed - No data to display  EKG None  Radiology No results found.  Procedures .Marland KitchenLaceration Repair  Date/Time: 08/21/2019 5:29 PM Performed by: Margarita Mail, PA-C Authorized  by: Margarita Mail, PA-C   Consent:    Consent obtained:  Verbal   Consent given by:  Patient   Risks discussed:  Infection, need for additional repair, pain, poor cosmetic result and poor wound healing   Alternatives discussed:  No treatment and delayed treatment Universal protocol:    Procedure explained and questions answered to patient or proxy's satisfaction: yes     Relevant documents present and verified: yes     Test results available and properly labeled: yes     Imaging studies available: yes     Required blood products, implants, devices, and special equipment available: yes     Site/side marked: yes     Immediately prior to procedure, a time out was called: yes     Patient identity confirmed:  Verbally with patient Anesthesia (see MAR for exact dosages):    Anesthesia method:  Local infiltration   Local anesthetic:  Lidocaine 1% WITH epi Laceration details:    Location:  Hand   Hand location:  L hand, dorsum   Length (cm):  2 Repair type:    Repair type:  Simple Pre-procedure details:    Preparation:  Patient was prepped and draped in usual sterile fashion Exploration:    Wound exploration: wound explored through full range of motion     Contaminated: no   Treatment:    Area cleansed with:  Betadine   Amount of cleaning:  Standard   Irrigation solution:  Sterile saline   Irrigation method:  Pressure wash Skin repair:    Repair method:  Sutures   Suture size:  4-0   Suture material:  Nylon   Suture technique:  Running locked   Number of sutures:  4 Approximation:    Approximation:  Close   (including critical care time)  Medications Ordered in ED Medications  lidocaine-EPINEPHrine (XYLOCAINE W/EPI) 2 %-1:100000 (with pres) injection 20 mL (20 mLs Intradermal Given 08/21/19 1636)    ED Course  I have reviewed the triage vital signs and the nursing notes.  Pertinent  labs & imaging results that were available during my care of the patient were reviewed by  me and considered in my medical decision making (see chart for details).    MDM Rules/Calculators/A&P                      Ruddy Strauser is a 59 y.o. male who presents to ED for laceration of l hand. Wound thoroughly cleaned in ED today. Wound explored and bottom of wound seen in a bloodless field. Laceration repaired as dictated above. Patient counseled on home wound care. Follow up with PCP/urgent care or return to ER for suture removal in7 days. Patient was urged to return to the Emergency Department for worsening pain, swelling, expanding erythema especially if it streaks away from the affected area, fever, or for any additional concerns. Patient verbalized understanding. All questions answered.  Final Clinical Impression(s) / ED Diagnoses Final diagnoses:  Laceration of left little finger with foreign body without damage to nail, initial encounter    Rx / DC Orders ED Discharge Orders    None       Margarita Mail, PA-C 08/21/19 1734    Lajean Saver, MD 08/22/19 [phone]

## 2019-08-21 NOTE — Discharge Instructions (Addendum)

## 2019-09-01 ENCOUNTER — Telehealth: Payer: Self-pay | Admitting: *Deleted

## 2019-09-01 NOTE — Telephone Encounter (Signed)
Pt is requesting pain medication refill. 

## 2019-09-02 ENCOUNTER — Telehealth: Payer: Self-pay | Admitting: Podiatry

## 2019-09-02 MED ORDER — OXYCODONE-ACETAMINOPHEN 10-325 MG PO TABS: 1.0000 | ORAL_TABLET | ORAL | 0 refills | Status: DC | PRN

## 2019-09-02 MED ORDER — PREGABALIN 50 MG PO CAPS: 50.0000 mg | ORAL_CAPSULE | Freq: Two times a day (BID) | ORAL | 0 refills | Status: DC

## 2019-09-02 NOTE — Telephone Encounter (Signed)
Called pt to let him know that medication was sent to pharmacy

## 2019-09-14 ENCOUNTER — Telehealth: Payer: Self-pay | Admitting: *Deleted

## 2019-09-14 MED ORDER — OXYCODONE-ACETAMINOPHEN 10-325 MG PO TABS: 1.0000 | ORAL_TABLET | ORAL | 0 refills | Status: DC | PRN

## 2019-09-14 MED ORDER — PREGABALIN 50 MG PO CAPS: 50.0000 mg | ORAL_CAPSULE | Freq: Two times a day (BID) | ORAL | 0 refills | Status: DC

## 2019-09-14 NOTE — Addendum Note (Signed)
Addended by: Hardie Pulley on: 09/14/2019 10:48 AM   Modules accepted: Orders

## 2019-09-14 NOTE — Telephone Encounter (Signed)
Pt called for the refill of pain medication.

## 2019-09-21 ENCOUNTER — Telehealth: Payer: Self-pay | Admitting: Podiatry

## 2019-09-21 MED ORDER — OXYCODONE-ACETAMINOPHEN 10-325 MG PO TABS: 1.0000 | ORAL_TABLET | ORAL | 0 refills | Status: DC | PRN

## 2019-09-21 NOTE — Telephone Encounter (Signed)
Pt requesting medication refill

## 2019-09-21 NOTE — Addendum Note (Signed)
Addended by: Hardie Pulley on: 09/21/2019 11:43 AM   Modules accepted: Orders

## 2019-09-26 NOTE — Progress Notes (Signed)
  Subjective:  Patient ID: [name], male    DOB: 01-08-1961,  MRN: [id number]  Chief Complaint  Patient presents with  . Neuritis    foot is still painful, [place] results    59 y.o. male presents with the above complaint.  Here to discuss results of ultrasound still having a lot of pain states that the pills are helping. Objective:  Physical Exam: warm, good capillary refill, no trophic changes or ulcerative lesions, normal DP and PT pulses Left Foot: normal exam, no swelling, tenderness, instability; ligaments intact, full range of motion of all ankle/foot joints; absent sensation distal sural nerve distribution with Tinel's over the incision to the sural nerve distribution. 4 cm healed incision posterior leg Right Foot: normal exam, no swelling, tenderness, instability; ligaments intact, full range of motion of all ankle/foot joints   Assessment:   1. Neuritis   2. Soft tissue mass   3. Neuroma of left leg   4. Cigarette smoker   5. COPD with acute exacerbation ([place])    Plan:  Patient was evaluated and treated and all questions answered.  Neuritis -Ultrasound reviewed with patient.  Sural neuroma measuring 8.5 x 6.8 x 3.4.  I spoke with the performing radiologist who did not believe there was transection of the nerve.  Results reviewed with patient.  Discussed repair versus decompression of the sural neuroma.  No guarantees given. Risk vs benefits of surgery discussed with patient.  All questions answered.  Consent form reviewed and signed by patient. -Patient has failed all conservative therapy and wishes to proceed with surgical intervention. All risks, benefits, and alternatives discussed with patient. No guarantees given. Consent reviewed and signed by patient. -Planned procedures: Decompression versus repair sural neuroma -Risks: Tobacco use, COPD    No follow-ups on file.

## 2019-10-02 ENCOUNTER — Other Ambulatory Visit: Payer: Self-pay

## 2019-10-02 ENCOUNTER — Ambulatory Visit (INDEPENDENT_AMBULATORY_CARE_PROVIDER_SITE_OTHER): Payer: Medicaid Other

## 2019-10-02 ENCOUNTER — Ambulatory Visit: Payer: Medicaid Other | Admitting: Podiatry

## 2019-10-02 DIAGNOSIS — I10 Essential (primary) hypertension: Secondary | ICD-10-CM

## 2019-10-02 DIAGNOSIS — G5782 Other specified mononeuropathies of left lower limb: Secondary | ICD-10-CM | POA: Diagnosis not present

## 2019-10-02 DIAGNOSIS — M79672 Pain in left foot: Secondary | ICD-10-CM

## 2019-10-02 DIAGNOSIS — Q828 Other specified congenital malformations of skin: Secondary | ICD-10-CM

## 2019-10-02 DIAGNOSIS — M722 Plantar fascial fibromatosis: Secondary | ICD-10-CM

## 2019-10-02 DIAGNOSIS — M216X9 Other acquired deformities of unspecified foot: Secondary | ICD-10-CM

## 2019-10-02 DIAGNOSIS — J449 Chronic obstructive pulmonary disease, unspecified: Secondary | ICD-10-CM

## 2019-10-02 DIAGNOSIS — F17210 Nicotine dependence, cigarettes, uncomplicated: Secondary | ICD-10-CM

## 2019-10-02 DIAGNOSIS — M216X2 Other acquired deformities of left foot: Secondary | ICD-10-CM | POA: Diagnosis not present

## 2019-10-02 MED ORDER — OXYCODONE-ACETAMINOPHEN 10-325 MG PO TABS: 1.0000 | ORAL_TABLET | ORAL | 0 refills | Status: DC | PRN

## 2019-10-02 NOTE — Progress Notes (Signed)
  Subjective:  Patient ID: [name], male    DOB: 09-Jul-1960,  MRN: [id number]  Chief Complaint  Patient presents with  . Foot Pain    Pt states left foot lateral foot pain is ongoing, unresolved.   DOS: 07/01/19 Procedure: Decompression sural neuroma   59 y.o. male presents with the above complaint. History confirmed with patient.  No other depressive pressure I discontinued pain.  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  He has increased incision in the foot and only has a loss of sensation in the right lateral aspect of the leg  Objective:  Physical Exam: tenderness at the surgical site, local edema noted and calf supple, nontender. Sensation intact in the sural distribution in the foot but absent from the ankle to mid calf at the lateral leg.  Tinel's sign at the posterior incision. Incision: healing well, no significant drainage, no dehiscence, no significant erythema  Radiographs left heel: No acute fractures heel spur formation noted Assessment:   1. Pain in left foot   2. Plantar fasciitis   3. Equinus deformity of foot   4. Porokeratosis   5. Sural neuritis, left   6. Essential hypertension   7. Cigarette smoker   8. COPD GOLD 0 / active smoker    Plan:  Patient was evaluated and treated and all questions answered.  Plantar fasciitis, benign skin lesion, sural neuritis -His nerve symptoms continue to improve.  We discussed continued these are likely continue to improve with time.  These do not appear to be his pressing issue today. -He is now getting frustrated with the pain in his heel.  I discussed that we can proceed with endoscopic plantar fascial release.  He also has a painful lesion on the plantar arch of the foot from when he stepped on a plug.  We can excise this lesion. -Plantar fascial brace dispensed. -Pain medication refilled -Patient has failed all conservative therapy and wishes to proceed with surgical  intervention. All risks, benefits, and alternatives discussed with patient. No guarantees given. Consent reviewed and signed by patient. -Planned procedures: EPF, excision of skin lesion plantar foot, possible heel spur resection. -Risk factors: COPD, Tobacco use, hypertension  Return for after surgery.

## 2019-10-02 NOTE — H&P (View-Only) (Signed)
  Subjective:  Patient ID: [name], male    DOB: 08-08-1960,  MRN: [id number]  Chief Complaint  Patient presents with  . Foot Pain    Pt states left foot lateral foot pain is ongoing, unresolved.   DOS: 07/01/19 Procedure: Decompression sural neuroma   59 y.o. male presents with the above complaint. History confirmed with patient.  No other depressive pressure I discontinued pain.  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  He has increased incision in the foot and only has a loss of sensation in the right lateral aspect of the leg  Objective:  Physical Exam: tenderness at the surgical site, local edema noted and calf supple, nontender. Sensation intact in the sural distribution in the foot but absent from the ankle to mid calf at the lateral leg.  Tinel's sign at the posterior incision. Incision: healing well, no significant drainage, no dehiscence, no significant erythema  Radiographs left heel: No acute fractures heel spur formation noted Assessment:   1. Pain in left foot   2. Plantar fasciitis   3. Equinus deformity of foot   4. Porokeratosis   5. Sural neuritis, left   6. Essential hypertension   7. Cigarette smoker   8. COPD GOLD 0 / active smoker    Plan:  Patient was evaluated and treated and all questions answered.  Plantar fasciitis, benign skin lesion, sural neuritis -His nerve symptoms continue to improve.  We discussed continued these are likely continue to improve with time.  These do not appear to be his pressing issue today. -He is now getting frustrated with the pain in his heel.  I discussed that we can proceed with endoscopic plantar fascial release.  He also has a painful lesion on the plantar arch of the foot from when he stepped on a plug.  We can excise this lesion. -Plantar fascial brace dispensed. -Pain medication refilled -Patient has failed all conservative therapy and wishes to proceed with surgical  intervention. All risks, benefits, and alternatives discussed with patient. No guarantees given. Consent reviewed and signed by patient. -Planned procedures: EPF, excision of skin lesion plantar foot, possible heel spur resection. -Risk factors: COPD, Tobacco use, hypertension  Return for after surgery.

## 2019-10-04 NOTE — Progress Notes (Signed)
  Subjective:  Patient ID: [name], male    DOB: 1960-08-03,  MRN: [id number]  Chief Complaint  Patient presents with  . Routine Post Op    POV#2 DOS 07/01/2019 LT LEG REPAIR/DECOMPRESSION OF SURAL NEUROMA. Pt states "I can feel my toes more but I am still having pain in my heel"   DOS: 07/01/19 Procedure: Decompression sural neuroma   59 y.o. male presents with the above complaint. History confirmed with patient.   Objective:  Physical Exam: tenderness at the surgical site, local edema noted and calf supple, nontender. Sensation intact at the 4th/5th toe Incision: healing well, no significant drainage, no dehiscence, no significant erythema  Assessment:   1. Sural neuritis, left   2. Neuritis   3. Post-operative state    Plan:  Patient was evaluated and treated and all questions answered.  Post-operative State Staples removed. -Dressing applied consisting of sterile gauze, kerlix and ACE bandage -WBAT in Surgical shoe  -Discussed I do think his nerve systems will regenerate with time. The nerve is intact distally so this likely will regenerate along its course.  -Pain medication refilled.  Return in about 2 weeks (around 07/31/2019).

## 2019-10-07 ENCOUNTER — Other Ambulatory Visit: Payer: Self-pay | Admitting: Podiatry

## 2019-10-07 DIAGNOSIS — M216X2 Other acquired deformities of left foot: Secondary | ICD-10-CM

## 2019-10-09 ENCOUNTER — Telehealth: Payer: Self-pay | Admitting: Podiatry

## 2019-10-09 NOTE — Telephone Encounter (Signed)
Pt called to request a pain med refill

## 2019-10-12 ENCOUNTER — Telehealth: Payer: Self-pay | Admitting: Podiatry

## 2019-10-12 MED ORDER — OXYCODONE-ACETAMINOPHEN 10-325 MG PO TABS: 1.0000 | ORAL_TABLET | ORAL | 0 refills | Status: DC | PRN

## 2019-10-12 NOTE — Addendum Note (Signed)
Addended by: Hardie Pulley on: 10/12/2019 11:19 AM   Modules accepted: Orders

## 2019-10-12 NOTE — Telephone Encounter (Signed)
Pt requesting pain medication.  

## 2019-10-13 ENCOUNTER — Encounter (HOSPITAL_BASED_OUTPATIENT_CLINIC_OR_DEPARTMENT_OTHER): Payer: Self-pay | Admitting: Podiatry

## 2019-10-14 ENCOUNTER — Other Ambulatory Visit: Payer: Self-pay

## 2019-10-14 ENCOUNTER — Encounter (HOSPITAL_BASED_OUTPATIENT_CLINIC_OR_DEPARTMENT_OTHER): Payer: Self-pay | Admitting: Podiatry

## 2019-10-14 NOTE — Progress Notes (Signed)
   10/14/19 1137  [place]  Have you ever been diagnosed with sleep apnea through a sleep study? No  Do you snore loudly (loud enough to be heard through closed doors)?  1  Has anyone observed you stop breathing during your sleep? 1  Do you have, or are you being treated for high blood pressure? 1  BMI more than 35 kg/m2? 0  Age > 71 (1-yes) 1  Male Gender (Yes=1) 1  Obstructive Sleep Apnea Score 5  Score 5 or greater  Results sent to PCP

## 2019-10-14 NOTE — Progress Notes (Addendum)
ADDENDUM:   Received call from Dr Vanessa Kick, stated that she heard from pt's pcp office and stated that her doing pt's H&P this evening and will fax to Dr March Rummage office and Lollie Marrow will fax to [facility] am morning of surgery.  ADDENDUM:  Chart reviewed by anesthesia, Konrad Felix PA, meets surgery center guidelines if pt stable and no changes since last surgery ok to proceed.  Pt was stable and had no health changes since last surgery in 04/ 2021. Have not received pt's pcp H&P yet from Dr March Rummage. Called and spoke w/ Lollie Marrow, [place] scheduler, stated she had not received yet from pt's pcp she will call pt's pcp office and check on status.   Spoke w/ via phone for pre-op interview--- PT Lab needs dos---- [place]              Lab results------ current ekg/ cxr in epic/ chart COVID test ------ 10-17-2019 @ 0935  Arrive at ------- 0700 NPO after MN NO Solid Food.  Clear liquids from MN until--- 0600 then nothing by mouth Medications to take morning of surgery ----- Norvasc, Lyrica w/ sips of water and do Symbicort inhaler Diabetic medication ----- n/a  Patient Special Instructions ----- pt to do nebulizer night before and morning of surgery.  Asked to bring rescue inhaler  Pre-Op special Istructions ----- Have not received pt's pcp H&P from Dr March Rummage office.  Pt stated he has seen his pcp and gave the h&p form.  Patient verbalized understanding of instructions that were given at this phone interview. Patient denies shortness of breath, chest pain, fever, cough a this phone interview.   Anesthesia Review: hx HTN:  COPD (last excerbation 01/ 2021 per pt);  Cardiomyopathy (dx 2009 30-35%, last echo 2017 ef 55-60%).  Pt stated has productive smoker's cough, denies any sob or difficulty breathing going up stairs or normal activities.  Pt stated has not used rescue inhaler in along time but has used nebulizer a week ago.  Pt instructed to use nebulizer night before surgery and moring of surgery.  Chart to be  reviewed by Konrad Felix PA.    PCP:  Dr Lillia Corporal Pulmonology:  Previously seen by Dr Melvyn Novas (lov 03-08-2017 epic) Cardiologist :  none Chest x-ray : 04-23-2019 epic EKG : 04-23-2019 epic Echo : 04-27-2015 epic Cardiac Cath :  12-10-2007 epic Activity level:  See above Sleep Study/ CPAP :   NO (pt scored 5 on stop-bang screen, routed to pt's pcp in epic) Fasting Blood Sugar :      / Checks Blood Sugar -- times a day:   N/A Blood Thinner/ Instructions /Last Dose: NO ASA / Instructions/ Last Dose :  NO

## 2019-10-17 ENCOUNTER — Inpatient Hospital Stay (HOSPITAL_COMMUNITY): Admission: RE | Admit: 2019-10-17 | Payer: Medicaid Other | Source: Ambulatory Visit

## 2019-10-19 ENCOUNTER — Other Ambulatory Visit (HOSPITAL_COMMUNITY)
Admission: RE | Admit: 2019-10-19 | Discharge: 2019-10-19 | Disposition: A | Discharge status: Home / Self Care | Payer: Medicaid Other | Source: Ambulatory Visit | Attending: Podiatry | Admitting: Podiatry

## 2019-10-19 ENCOUNTER — Telehealth: Payer: Self-pay | Admitting: *Deleted

## 2019-10-19 DIAGNOSIS — Z01812 Encounter for preprocedural laboratory examination: Secondary | ICD-10-CM | POA: Insufficient documentation

## 2019-10-19 DIAGNOSIS — Z20822 Contact with and (suspected) exposure to covid-19: Secondary | ICD-10-CM | POA: Insufficient documentation

## 2019-10-19 LAB — SARS CORONAVIRUS 2 (TAT 6-24 HRS): SARS Coronavirus 2: NEGATIVE

## 2019-10-19 MED ORDER — OXYCODONE-ACETAMINOPHEN 10-325 MG PO TABS: 1.0000 | ORAL_TABLET | ORAL | 0 refills | Status: DC | PRN

## 2019-10-19 NOTE — Telephone Encounter (Signed)
Pt called states he has taken his last pain pill and needs a refill.

## 2019-10-19 NOTE — Telephone Encounter (Signed)
I informed pt of Dr. Eleanora Neighbor Secure Chat message that he had refill the pain medication and added additional medication to cover his schedule surgery of this week. Pt states understanding.

## 2019-10-20 NOTE — Progress Notes (Signed)
Left voice mail message with shelley durant need h and p for surgery 10-21-19

## 2019-10-21 ENCOUNTER — Ambulatory Visit (HOSPITAL_BASED_OUTPATIENT_CLINIC_OR_DEPARTMENT_OTHER): Payer: Medicaid Other | Admitting: Anesthesiology

## 2019-10-21 ENCOUNTER — Encounter (HOSPITAL_BASED_OUTPATIENT_CLINIC_OR_DEPARTMENT_OTHER): Payer: Self-pay | Admitting: Podiatry

## 2019-10-21 ENCOUNTER — Encounter (HOSPITAL_BASED_OUTPATIENT_CLINIC_OR_DEPARTMENT_OTHER)
Admission: RE | Disposition: A | Discharge status: Home / Self Care | Payer: Self-pay | Source: Home / Self Care | Attending: Podiatry

## 2019-10-21 ENCOUNTER — Other Ambulatory Visit: Payer: Self-pay

## 2019-10-21 ENCOUNTER — Ambulatory Visit (HOSPITAL_BASED_OUTPATIENT_CLINIC_OR_DEPARTMENT_OTHER)
Admission: RE | Admit: 2019-10-21 | Discharge: 2019-10-21 | Disposition: A | Discharge status: Home / Self Care | Payer: Medicaid Other | Attending: Podiatry | Admitting: Podiatry

## 2019-10-21 DIAGNOSIS — D492 Neoplasm of unspecified behavior of bone, soft tissue, and skin: Secondary | ICD-10-CM | POA: Diagnosis not present

## 2019-10-21 DIAGNOSIS — Q828 Other specified congenital malformations of skin: Secondary | ICD-10-CM | POA: Insufficient documentation

## 2019-10-21 DIAGNOSIS — M722 Plantar fascial fibromatosis: Secondary | ICD-10-CM | POA: Diagnosis not present

## 2019-10-21 DIAGNOSIS — F17210 Nicotine dependence, cigarettes, uncomplicated: Secondary | ICD-10-CM | POA: Diagnosis not present

## 2019-10-21 DIAGNOSIS — L989 Disorder of the skin and subcutaneous tissue, unspecified: Secondary | ICD-10-CM | POA: Diagnosis not present

## 2019-10-21 DIAGNOSIS — I10 Essential (primary) hypertension: Secondary | ICD-10-CM | POA: Diagnosis not present

## 2019-10-21 DIAGNOSIS — M216X9 Other acquired deformities of unspecified foot: Secondary | ICD-10-CM | POA: Insufficient documentation

## 2019-10-21 DIAGNOSIS — L720 Epidermal cyst: Secondary | ICD-10-CM | POA: Diagnosis not present

## 2019-10-21 DIAGNOSIS — M7732 Calcaneal spur, left foot: Secondary | ICD-10-CM | POA: Diagnosis not present

## 2019-10-21 DIAGNOSIS — M216X2 Other acquired deformities of left foot: Secondary | ICD-10-CM | POA: Diagnosis not present

## 2019-10-21 DIAGNOSIS — J449 Chronic obstructive pulmonary disease, unspecified: Secondary | ICD-10-CM | POA: Diagnosis not present

## 2019-10-21 HISTORY — DX: Other specified personal risk factors, not elsewhere classified: Z91.89

## 2019-10-21 HISTORY — PX: PLANTAR FASCIA RELEASE: SHX2239

## 2019-10-21 HISTORY — PX: HEEL SPUR RESECTION: SHX6410

## 2019-10-21 LAB — POCT I-STAT, CHEM 8
BUN: 12 mg/dL (ref 6–20)
Calcium, Ion: 1.3 mmol/L (ref 1.15–1.40)
Chloride: 107 mmol/L (ref 98–111)
Creatinine, Ser: 1 mg/dL (ref 0.61–1.24)
Glucose, Bld: 113 mg/dL — ABNORMAL HIGH (ref 70–99)
HCT: 49 % (ref 39.0–52.0)
Hemoglobin: 16.7 g/dL (ref 13.0–17.0)
Potassium: 3.7 mmol/L (ref 3.5–5.1)
Sodium: 143 mmol/L (ref 135–145)
TCO2: 21 mmol/L — ABNORMAL LOW (ref 22–32)

## 2019-10-21 SURGERY — RELEASE, FASCIA, PLANTAR
Anesthesia: Monitor Anesthesia Care | Laterality: Left

## 2019-10-21 MED ORDER — LIDOCAINE 2% (20 MG/ML) 5 ML SYRINGE
INTRAMUSCULAR | Status: AC
  Filled 2019-10-21: qty 5

## 2019-10-21 MED ORDER — DEXAMETHASONE SODIUM PHOSPHATE 4 MG/ML IJ SOLN
INTRAMUSCULAR | Status: DC | PRN
  Administered 2019-10-21: 5 mg via INTRAVENOUS

## 2019-10-21 MED ORDER — LIDOCAINE HCL (CARDIAC) PF 100 MG/5ML IV SOSY
PREFILLED_SYRINGE | INTRAVENOUS | Status: DC | PRN
  Administered 2019-10-21: 60 mg via INTRAVENOUS

## 2019-10-21 MED ORDER — FENTANYL CITRATE (PF) 100 MCG/2ML IJ SOLN
INTRAMUSCULAR | Status: DC | PRN
  Administered 2019-10-21 (×4): 25 ug via INTRAVENOUS

## 2019-10-21 MED ORDER — HYDROMORPHONE HCL 2 MG PO TABS
2.0000 mg | ORAL_TABLET | Freq: Once | ORAL | Status: AC | PRN
  Administered 2019-10-21: 2 mg via ORAL

## 2019-10-21 MED ORDER — PROPOFOL 500 MG/50ML IV EMUL
INTRAVENOUS | Status: DC | PRN
  Administered 2019-10-21: 75 ug/kg/min via INTRAVENOUS

## 2019-10-21 MED ORDER — ONDANSETRON HCL 4 MG/2ML IJ SOLN
INTRAMUSCULAR | Status: AC
  Filled 2019-10-21: qty 2

## 2019-10-21 MED ORDER — LACTATED RINGERS IV SOLN: INTRAVENOUS | Status: DC

## 2019-10-21 MED ORDER — KETOROLAC TROMETHAMINE 30 MG/ML IJ SOLN
INTRAMUSCULAR | Status: AC
  Filled 2019-10-21: qty 1

## 2019-10-21 MED ORDER — GLYCOPYRROLATE PF 0.2 MG/ML IJ SOSY
PREFILLED_SYRINGE | INTRAMUSCULAR | Status: AC
  Filled 2019-10-21: qty 1

## 2019-10-21 MED ORDER — HYDROMORPHONE HCL 2 MG PO TABS
ORAL_TABLET | ORAL | Status: AC
  Filled 2019-10-21: qty 1

## 2019-10-21 MED ORDER — FENTANYL CITRATE (PF) 100 MCG/2ML IJ SOLN
INTRAMUSCULAR | Status: AC
  Filled 2019-10-21: qty 2

## 2019-10-21 MED ORDER — 0.9 % SODIUM CHLORIDE (POUR BTL) OPTIME
TOPICAL | Status: DC | PRN
  Administered 2019-10-21: 500 mL

## 2019-10-21 MED ORDER — HYDROMORPHONE HCL 2 MG PO TABS: 2.0000 mg | ORAL_TABLET | ORAL | 0 refills | Status: DC | PRN

## 2019-10-21 MED ORDER — MIDAZOLAM HCL 5 MG/5ML IJ SOLN
INTRAMUSCULAR | Status: DC | PRN
  Administered 2019-10-21: 1 mg via INTRAVENOUS
  Administered 2019-10-21: 2 mg via INTRAVENOUS
  Administered 2019-10-21: 1 mg via INTRAVENOUS

## 2019-10-21 MED ORDER — HYDROMORPHONE HCL 1 MG/ML IJ SOLN
0.2500 mg | INTRAMUSCULAR | Status: DC | PRN
  Administered 2019-10-21 (×4): 0.5 mg via INTRAVENOUS

## 2019-10-21 MED ORDER — DEXTROSE 5 % IV SOLN
3.0000 g | INTRAVENOUS | Status: AC
  Administered 2019-10-21: 3 g via INTRAVENOUS
  Filled 2019-10-21: qty 3
  Filled 2019-10-21: qty 3000

## 2019-10-21 MED ORDER — MIDAZOLAM HCL 2 MG/2ML IJ SOLN
INTRAMUSCULAR | Status: AC
  Filled 2019-10-21: qty 2

## 2019-10-21 MED ORDER — GLYCOPYRROLATE 0.2 MG/ML IJ SOLN
INTRAMUSCULAR | Status: DC | PRN
  Administered 2019-10-21: .2 mg via INTRAVENOUS

## 2019-10-21 MED ORDER — PROPOFOL 500 MG/50ML IV EMUL
INTRAVENOUS | Status: AC
  Filled 2019-10-21: qty 50

## 2019-10-21 MED ORDER — HYDROMORPHONE HCL 1 MG/ML IJ SOLN
INTRAMUSCULAR | Status: AC
  Filled 2019-10-21: qty 1

## 2019-10-21 MED ORDER — CEPHALEXIN 500 MG PO CAPS
500.0000 mg | ORAL_CAPSULE | Freq: Two times a day (BID) | ORAL | 0 refills | Status: DC
Start: 2019-10-21 — End: 2020-01-27

## 2019-10-21 MED ORDER — BUPIVACAINE HCL (PF) 0.5 % IJ SOLN
INTRAMUSCULAR | Status: DC | PRN
  Administered 2019-10-21: 20 mL

## 2019-10-21 MED ORDER — ONDANSETRON HCL 4 MG/2ML IJ SOLN
INTRAMUSCULAR | Status: DC | PRN
  Administered 2019-10-21: 4 mg via INTRAVENOUS

## 2019-10-21 MED ORDER — KETOROLAC TROMETHAMINE 30 MG/ML IJ SOLN
INTRAMUSCULAR | Status: DC | PRN
  Administered 2019-10-21: 30 mg via INTRAVENOUS

## 2019-10-21 MED ORDER — PROPOFOL 10 MG/ML IV BOLUS
INTRAVENOUS | Status: DC | PRN
  Administered 2019-10-21: 30 mg via INTRAVENOUS

## 2019-10-21 SURGICAL SUPPLY — 56 items
APL PRP STRL LF DISP 70% ISPRP (MISCELLANEOUS) ×1
APL SKNCLS STERI-STRIP NONHPOA (GAUZE/BANDAGES/DRESSINGS) ×1
APL SWBSTK 6 STRL LF DISP (MISCELLANEOUS) ×6
APPLICATOR COTTON TIP 6 STRL (MISCELLANEOUS) IMPLANT
APPLICATOR COTTON TIP 6IN STRL (MISCELLANEOUS) ×18
BANDAGE ESMARK 6X9 LF (GAUZE/BANDAGES/DRESSINGS) IMPLANT
BENZOIN TINCTURE PRP APPL 2/3 (GAUZE/BANDAGES/DRESSINGS) ×3 IMPLANT
BLADE CLIPPER SENSICLIP SURGIC (BLADE) IMPLANT
BLADE SURG 15 STRL LF DISP TIS (BLADE) ×1 IMPLANT
BLADE SURG 15 STRL SS (BLADE) ×3
BLADE TRIANGLE EPF/EGR ENDO (BLADE) ×2 IMPLANT
BNDG CMPR 9X4 STRL LF SNTH (GAUZE/BANDAGES/DRESSINGS)
BNDG CMPR 9X6 STRL LF SNTH (GAUZE/BANDAGES/DRESSINGS)
BNDG ELASTIC 4X5.8 VLCR STR LF (GAUZE/BANDAGES/DRESSINGS) ×3 IMPLANT
BNDG ESMARK 4X9 LF (GAUZE/BANDAGES/DRESSINGS) IMPLANT
BNDG ESMARK 6X9 LF (GAUZE/BANDAGES/DRESSINGS)
BNDG GAUZE ELAST 4 BULKY (GAUZE/BANDAGES/DRESSINGS) ×3 IMPLANT
CHLORAPREP W/TINT 26 (MISCELLANEOUS) ×3 IMPLANT
CLOSURE WOUND 1/2 X4 (GAUZE/BANDAGES/DRESSINGS) ×1
COVER WAND RF STERILE (DRAPES) ×3 IMPLANT
DECANTER SPIKE VIAL GLASS SM (MISCELLANEOUS) IMPLANT
DRAPE EXTREMITY T 121X128X90 (DISPOSABLE) ×3 IMPLANT
DRAPE U-SHAPE 47X51 STRL (DRAPES) ×3 IMPLANT
DRSG EMULSION OIL 3X3 NADH (GAUZE/BANDAGES/DRESSINGS) ×3 IMPLANT
DRSG PAD ABDOMINAL 8X10 ST (GAUZE/BANDAGES/DRESSINGS) ×6 IMPLANT
ELECT REM PT RETURN 9FT ADLT (ELECTROSURGICAL) ×3
ELECTRODE REM PT RTRN 9FT ADLT (ELECTROSURGICAL) ×1 IMPLANT
GAUZE SPONGE 4X4 12PLY STRL (GAUZE/BANDAGES/DRESSINGS) IMPLANT
GLOVE BIO SURGEON STRL SZ7.5 (GLOVE) ×2 IMPLANT
GLOVE BIOGEL PI IND STRL 8 (GLOVE) ×1 IMPLANT
GLOVE BIOGEL PI INDICATOR 8 (GLOVE) ×4
GLOVE ECLIPSE 7.5 STRL STRAW (GLOVE) ×6 IMPLANT
GOWN STRL REUS W/ TWL LRG LVL3 (GOWN DISPOSABLE) ×1 IMPLANT
GOWN STRL REUS W/ TWL XL LVL3 (GOWN DISPOSABLE) ×2 IMPLANT
GOWN STRL REUS W/TWL LRG LVL3 (GOWN DISPOSABLE) ×3
GOWN STRL REUS W/TWL XL LVL3 (GOWN DISPOSABLE) ×6
HOLDER KNEE FOAM BLUE (MISCELLANEOUS) IMPLANT
KIT TURNOVER CYSTO (KITS) ×3 IMPLANT
NEEDLE HYPO 22GX1.5 SAFETY (NEEDLE) IMPLANT
NS IRRIG 1000ML POUR BTL (IV SOLUTION) ×3 IMPLANT
PACK ARTHROSCOPY DSU (CUSTOM PROCEDURE TRAY) ×3 IMPLANT
PACK BASIN DAY SURGERY FS (CUSTOM PROCEDURE TRAY) ×3 IMPLANT
PAD ARMBOARD 7.5X6 YLW CONV (MISCELLANEOUS) ×6 IMPLANT
PAD CAST 4YDX4 CTTN HI CHSV (CAST SUPPLIES) ×1 IMPLANT
PADDING CAST COTTON 4X4 STRL (CAST SUPPLIES) ×3
PUNCH BIOPSY DERMAL 4MM (INSTRUMENTS) IMPLANT
RASP SM TEAR CROSS CUT (RASP) ×2 IMPLANT
SPONGE LAP 4X18 RFD (DISPOSABLE) ×3 IMPLANT
SPONGE SURGIFOAM ABS GEL 12-7 (HEMOSTASIS) ×2 IMPLANT
STRIP CLOSURE SKIN 1/2X4 (GAUZE/BANDAGES/DRESSINGS) ×2 IMPLANT
SUCTION FRAZIER HANDLE 10FR (MISCELLANEOUS) ×3
SUCTION TUBE FRAZIER 10FR DISP (MISCELLANEOUS) IMPLANT
SUT ETHILON 4 0 PS 2 18 (SUTURE) ×4 IMPLANT
SUT VIC AB 3-0 FS2 27 (SUTURE) ×3 IMPLANT
SYR CONTROL 10ML LL (SYRINGE) IMPLANT
WATER STERILE IRR 1000ML POUR (IV SOLUTION) ×3 IMPLANT

## 2019-10-21 NOTE — Transfer of Care (Addendum)
Immediate Anesthesia Transfer of Care Note  Patient: [name]  Procedure(s) Performed: Procedure(s) (LRB): ENDOSCOPIC PLANTAR FASCIA RELEASE; EXCISION SKIN LESION (Left) HEEL SPUR RESECTION (Left)  Patient Location: PACU  Anesthesia Type: MAC   Level of Consciousness: awake, sedated, patient cooperative and responds to stimulation  Airway & Oxygen Therapy: Patient Spontanous Breathing and Patient connected to face mask oxygen   Post-op Assessment: Report given to PACU RN, Post -op Vital signs reviewed and stable and Patient moving all extremities  Post vital signs: Reviewed and stable  Complications: No apparent anesthesia complications

## 2019-10-21 NOTE — Anesthesia Preprocedure Evaluation (Addendum)
Anesthesia Evaluation  Patient identified by MRN, date of birth, ID band  Reviewed: Allergy & Precautions, NPO status , Patient's Chart, lab work & pertinent test results  Airway Mallampati: II  TM Distance: >3 FB     Dental   Pulmonary pneumonia, COPD, Current Smoker and Patient abstained from smoking.,    breath sounds clear to auscultation       Cardiovascular hypertension,  Rhythm:Regular Rate:Normal  History noted. CG   Neuro/Psych  Headaches,    GI/Hepatic negative GI ROS, Neg liver ROS,   Endo/Other  negative endocrine ROS  Renal/GU negative Renal ROS     Musculoskeletal  (+) Arthritis ,   Abdominal   Peds  Hematology   Anesthesia Other Findings   Reproductive/Obstetrics                            Anesthesia Physical Anesthesia Plan  ASA: III  Anesthesia Plan: MAC   Post-op Pain Management:    Induction: Intravenous  PONV Risk Score and Plan: 2 and Ondansetron, Dexamethasone and Midazolam  Airway Management Planned: Simple Face Mask and Nasal Cannula  Additional Equipment:   Intra-op Plan:   Post-operative Plan:   Informed Consent: I have reviewed the patients History and Physical, chart, labs and discussed the procedure including the risks, benefits and alternatives for the proposed anesthesia with the patient or authorized representative who has indicated his/her understanding and acceptance.     Dental advisory given  Plan Discussed with: CRNA and Anesthesiologist  Anesthesia Plan Comments:        Anesthesia Quick Evaluation

## 2019-10-21 NOTE — Discharge Instructions (Signed)
  After Surgery Instructions   1) If you are recuperating from surgery anywhere other than home, please be sure to leave [place] the number where you can be reached.  2) Go directly home and rest.  3) Keep the operated foot(feet) elevated six inches above the hip when sitting or lying down. This will help control swelling and pain.  4) Support the elevated foot and leg with pillows. DO NOT PLACE PILLOWS UNDER THE KNEE.  5) DO NOT REMOVE or get your bandages WET, unless you were given different instructions by your doctor to do so. This increases the risk of infection.  6) Wear your surgical shoe or surgical boot at all times when you are up on your feet.  7) A limited amount of pain and swelling may occur. The skin may take on a bruised appearance. DO NOT BE ALARMED, THIS IS NORMAL.  8) For slight pain and swelling, apply an ice pack directly over the bandages for 15 minutes only out of each hour of the day. Continue until seen in the office for your first post op visit. DO NOT APPLY ANY FORM OF HEAT TO THE AREA.  9) Have prescriptions filled immediately and take as directed.  10) Drink lots of liquids, water and juice to stay hydrated.  11) CALL IMMEDIATELY IF:  *Bleeding continues until the following day of surgery  *Pain increases and/or does not respond to medication  *Bandages or cast appears to tight  *If your bandage gets wet  *Trip, fall or stump your surgical foot  *If your temperature goes above 101  *If you have ANY questions at all  12) You are expected to be weightbearing after your surgery.   If you need to reach the nurse for any reason, please call: [place]: [phone] [place]: [phone] [place]: [phone]  Post Anesthesia Home Care Instructions  Activity: Get plenty of rest for the remainder of the day. A responsible individual must stay with you for 24 hours following the procedure.  For the next 24 hours, DO NOT: -Drive a  car -Paediatric nurse -Drink alcoholic beverages -Take any medication unless instructed by your physician -Make any legal decisions or sign important papers.  Meals: Start with liquid foods such as gelatin or soup. Progress to regular foods as tolerated. Avoid greasy, spicy, heavy foods. If nausea and/or vomiting occur, drink only clear liquids until the nausea and/or vomiting subsides. Call your physician if vomiting continues.  Special Instructions/Symptoms: Your throat may feel dry or sore from the anesthesia or the breathing tube placed in your throat during surgery. If this causes discomfort, gargle with warm salt water. The discomfort should disappear within 24 hours.  No ibuprofen, Advil, Aleve, Motrin, or naproxen until after 6:30 pm today if needed.

## 2019-10-21 NOTE — Progress Notes (Signed)
H and P received by fax for 10-21-2019 surgery and faxed to [place] [phone] to be placed on pre op chart. Vickie delp received  h and p by fax

## 2019-10-21 NOTE — Interval H&P Note (Signed)
History and Physical Interval Note:  10/21/2019 7:27 AM  [name]  has presented today for surgery, with the diagnosis of LEFT FOOT PLANTAR FASCIITIS; HEAL SPUR; SKIN LESION.  The various methods of treatment have been discussed with the patient and family. After consideration of risks, benefits and other options for treatment, the patient has consented to  Procedure(s): ENDOSCOPIC PLANTAR FASCIA RELEASE; EXCISION SKIN LESION (Left) HEEL SPUR RESECTION (Left) as a surgical intervention.  The patient's history has been reviewed, patient examined, no change in status, stable for surgery.  I have reviewed the patient's chart and labs.  Questions were answered to the patient's satisfaction.     [name]

## 2019-10-21 NOTE — Op Note (Signed)
  Patient Name: [name] DOB: December 09, 1960  MRN: [id number]   Date of Surgery: 10/21/2019  Surgeon: Dr. Hardie Pulley, DPM Assistants: None  Pre-operative Diagnosis:  Plantar Fasciitis Left Heel Spur Left Puncture Wound Left Arch Post-operative Diagnosis:  Same Procedures:  1) Endoscopic Plantar Fasciotomy  2) Heel Spur Resection  3) Excision of Benign skin lesion Pathology/Specimens: ID Type Source Tests Collected by Time Destination  1 : skin lesion left foot Tissue PATH Soft tissue SURGICAL PATHOLOGY [name], DPM 10/21/2019 0928    Anesthesia: MAC/local Hemostasis:  Total Tourniquet Time Documented: Calf (Left) - 71 minutes Total: Calf (Left) - 71 minutes  Estimated Blood Loss: 10 mL Materials: * No implants in log * Medications: none Complications: none  Indications for Procedure:  This is a 59 y.o. male with chronic left heel pain and a left foot puncture lesion. He presents today for elective correction. All risks, benefits, and alternatives of surgery were discussed. No guarantees were given.   Procedure in Detail: Patient was identified in pre-operative holding area. Formal consent was signed and the left lower extremity was marked. Patient was brought back to the operating room. Anesthesia was induced. The extremity was prepped and draped in the usual sterile fashion. Timeout was taken to confirm patient name, laterality, and procedure prior to incision.   Attention was then directed to the left foot. There was a puncture lesion at the medial arch. This was sharply excised with biopsy tool and 15 blade to ensure no hyperkeratotic skin. There was a chronic sinus tract evident in the subq layer. This was collected and sent for pathology. The wound was curettaged. The resultant lesion measured 1.2 x0.4. The wound was irrigated and closed with 4-0 nylon  Attention was then directed to the plantar heel. A small incision was made at the medial aspect of the  calcaneus about the area of the plantar fascia.  An elevator was used to release a soft tissue plane inferior to the plantar fascia a trocar and cannula was placed inferior to the plantar fascia.  A 4 mm endoscope was then placed through the cannula and the plantar fascia was visualized.  Under direct visualization the medial one third of the plantar fascia was incised with a hook blade.  After ensuring that the fascia was completely incised the trocar and cannula was removed.  Under direct fluoroscopic visualization the calcaneal heel spur was then thoroughly reduced with a power rasp.  Fluoroscopic images confirmed reduction. A rongeur was used to remove additional pieces of the residual spur. There was a large part of the spur that was distal and likely attached to the distal part of the plantar fascia and was left intact.  The wound was irrigated. Gelfoam was packed in the resected area to prevent hematoma formation. The wound was closed with 3-0 monocryl and 4-0 nylon.  The foot was then dressed with xeroform, 4x4, kerlix, and ACE bandage. Patient tolerated the procedure well.  Disposition: Following a period of post-operative monitoring, patient will be transferred home. He will be weightbearing as tolerated to the right heel in a boot.

## 2019-10-21 NOTE — Anesthesia Postprocedure Evaluation (Signed)
Anesthesia Post Note  Patient: [name]  Procedure(s) Performed: ENDOSCOPIC PLANTAR FASCIA RELEASE; EXCISION SKIN LESION (Left ) HEEL SPUR RESECTION (Left )     Patient location during evaluation: PACU Anesthesia Type: MAC Level of consciousness: awake Pain management: pain level controlled Vital Signs Assessment: post-procedure vital signs reviewed and stable Respiratory status: spontaneous breathing Cardiovascular status: stable Postop Assessment: no apparent nausea or vomiting Anesthetic complications: no   No complications documented.  Last Vitals:  Vitals:   10/21/19 1041 10/21/19 1045  BP: (!) 130/94 (!) 141/89  Pulse: 73 69  Resp: 17 16  Temp: (!) 36.4 C   SpO2: 91% 97%    Last Pain:  Vitals:   10/21/19 1051  TempSrc:   PainSc: 8                  Mekala Winger

## 2019-10-21 NOTE — Anesthesia Procedure Notes (Signed)
Procedure Name: MAC Date/Time: 10/21/2019 9:00 AM Performed by: Justice Rocher, CRNA Pre-anesthesia Checklist: Patient identified, Emergency Drugs available, Suction available, Patient being monitored and Timeout performed Patient Re-evaluated:Patient Re-evaluated prior to induction Oxygen Delivery Method: Simple face mask Preoxygenation: Pre-oxygenation with 100% oxygen Induction Type: IV induction Placement Confirmation: positive ETCO2,  CO2 detector and breath sounds checked- equal and bilateral

## 2019-10-22 LAB — SURGICAL PATHOLOGY

## 2019-10-23 ENCOUNTER — Telehealth: Payer: Self-pay | Admitting: *Deleted

## 2019-10-23 NOTE — Telephone Encounter (Signed)
Pt states he is in severe pain and the dilaudid is not not helping, and he took the dressing off and there was a lot of blood.

## 2019-10-23 NOTE — Telephone Encounter (Signed)
If it's dried it's ok to leave until next apt he can just remove the ace bandage layer and replace it with a new Ace bandage without touching the white layer. If it continues he needs to call back  If he could come in today before 5 we could change it before the weekend.

## 2019-10-23 NOTE — Telephone Encounter (Signed)
Ok thanks 

## 2019-10-23 NOTE — Telephone Encounter (Signed)
I spoke with pt and he states he took a bath yesterday and there was a lot of blood on the dressing. I asked pt if it was the size of a 50cent piece and he said it was the size of two. I asked pt if the blood was dried and he said he didn't check. I told pt that if it was dried that it was okay. I told pt that he could take 800mg  of ibuprofen 3 times a day in between the doses of the dilaudid one tablet every 4 hours. Pt states he is taking dilaudid as instructed and the ibuprofen. I told pt to not be on his foot or have it down more than 15 minutes/hour. I told pt if he had the boot off then he should elevate it for 15 minutes and after 15 minutes place the foot level with his hip and replace the ace, sock and boot. Pt states he slept without the boot last night. I told him to sleep in the boot.

## 2019-10-26 ENCOUNTER — Encounter (HOSPITAL_BASED_OUTPATIENT_CLINIC_OR_DEPARTMENT_OTHER): Payer: Self-pay | Admitting: Podiatry

## 2019-10-28 ENCOUNTER — Other Ambulatory Visit: Payer: Self-pay | Admitting: Podiatry

## 2019-10-28 ENCOUNTER — Telehealth: Payer: Self-pay | Admitting: Podiatry

## 2019-10-28 MED ORDER — HYDROMORPHONE HCL 2 MG PO TABS: 2.0000 mg | ORAL_TABLET | ORAL | 0 refills | Status: DC | PRN

## 2019-10-28 NOTE — Telephone Encounter (Signed)
Sent dilaudid

## 2019-10-28 NOTE — Telephone Encounter (Signed)
I spoke with pt and he states the pain has eased off, but at times is a sharp 7, and even when he rest or sleeps if he moves it a certain way out of the comfort zone then he is awake and at 7.

## 2019-10-28 NOTE — Telephone Encounter (Signed)
Pt called requesting pain medication refill

## 2019-10-30 ENCOUNTER — Other Ambulatory Visit: Payer: Self-pay

## 2019-10-30 ENCOUNTER — Ambulatory Visit (INDEPENDENT_AMBULATORY_CARE_PROVIDER_SITE_OTHER): Payer: Medicaid Other | Admitting: Podiatry

## 2019-10-30 DIAGNOSIS — M216X9 Other acquired deformities of unspecified foot: Secondary | ICD-10-CM

## 2019-10-30 DIAGNOSIS — M722 Plantar fascial fibromatosis: Secondary | ICD-10-CM

## 2019-10-30 DIAGNOSIS — M79672 Pain in left foot: Secondary | ICD-10-CM

## 2019-10-30 DIAGNOSIS — Z9889 Other specified postprocedural states: Secondary | ICD-10-CM

## 2019-10-30 MED ORDER — KETOROLAC TROMETHAMINE 10 MG PO TABS: 10.0000 mg | ORAL_TABLET | Freq: Four times a day (QID) | ORAL | 0 refills | Status: DC | PRN

## 2019-10-30 MED ORDER — OXYCODONE-ACETAMINOPHEN 10-325 MG PO TABS: 1.0000 | ORAL_TABLET | Freq: Four times a day (QID) | ORAL | 0 refills | Status: AC | PRN

## 2019-11-01 ENCOUNTER — Encounter: Payer: Self-pay | Admitting: Podiatry

## 2019-11-01 NOTE — Progress Notes (Signed)
Subjective:  Patient ID: [name], male    DOB: 1960-08-15,  MRN: [id number]  Chief Complaint  Patient presents with  . Routine Post Op    #1 DOS 10/21/2019 LT FOOT EPF, POSSIBLE HEEL SPUR RESECTION, EXCISION OF SKIN LESION PLANTAR FOOT/DR PRICE PT     59 y.o. male returns for post-op check.  Patient states is in a lot of pain.  Patient states that he has not had any pain like this before in the past.  Patient has been taking pain medication has run out.  Patient has been wearing his shoes and using the crutches.  He states the sutures are intact bandage is clean dry intact.  No other acute issues.  Review of Systems: Negative except as noted in the HPI. Denies N/V/F/Ch.  Past Medical History:  Diagnosis Date  . At risk for sleep apnea    10-14-2019  ---   STOP--BANG SCORE = 5   (screening routed  to pt's pcp in epic)  . Cardiomyopathy ([place]) 2009   per cardiac cath 12-10-2007 ef 35-40%;    last echo 04-27-2015 in ef 55-60%  . COPD (chronic obstructive pulmonary disease) ([place])    previously seen pulmonologist-- dr Melvyn Novas--- GOLD 0-- hx multiple exacerbation's last one 04-23-2019 w/ ARF hypoxia (10-14-2019  per pt followed by pcp;  pt was on symbicort bid and duoneb and rescue inhaler as needed  ,  pt stated has productive smoker's cough but no sob or difficulty breathing;  last used nebulizer one week ago and not used rescue inhaler in along time)  . DDD (degenerative disc disease), cervical    ALL OVER  . History of nuclear stress test    epic 04-08-2015   low risk w/ small inferior wall scar of mild severity focus of reversible ischemia, ef 61%  . Hypertension    followed by pcp  . Plantar fasciitis, left     Current Outpatient Medications:  .  albuterol (VENTOLIN HFA) 108 (90 Base) MCG/ACT inhaler, Inhale 2 puffs into the lungs every 4 (four) hours as needed for wheezing or shortness of breath., Disp: 18 g, Rfl: 2 .  cephALEXin (KEFLEX) 500 MG capsule, Take 1 capsule (500  mg total) by mouth 2 (two) times daily., Disp: 14 capsule, Rfl: 0 .  HYDROmorphone (DILAUDID) 2 MG tablet, Take 1 tablet (2 mg total) by mouth every 4 (four) hours as needed for severe pain., Disp: 15 tablet, Rfl: 0 .  ipratropium-albuterol (DUONEB) 0.5-2.5 (3) MG/3ML SOLN, Take 3 mLs by nebulization 3 (three) times daily. (Patient taking differently: Take 3 mLs by nebulization every 4 (four) hours as needed. ), Disp: 360 mL, Rfl: 1 .  oxyCODONE-acetaminophen (PERCOCET) 10-325 MG tablet, Take 1 tablet by mouth every 4 (four) hours as needed for pain., Disp: 40 tablet, Rfl: 0 .  pregabalin (LYRICA) 50 MG capsule, Take 1 capsule (50 mg total) by mouth 2 (two) times daily., Disp: 28 capsule, Rfl: 0 .  sildenafil (REVATIO) 20 MG tablet, Take 20-40 mg by mouth as needed (before sexual activity). , Disp: , Rfl:  .  SYMBICORT 160-4.5 MCG/ACT inhaler, Inhale 2 puffs into the lungs 2 (two) times daily., Disp: 1 Inhaler, Rfl: 12 .  triamterene-hydrochlorothiazide (DYAZIDE) 37.5-25 MG capsule, Take 1 capsule by mouth daily. , Disp: , Rfl:  .  zolpidem (AMBIEN) 5 MG tablet, Take 1 tablet (5 mg total) by mouth at bedtime as needed for sleep., Disp: 30 tablet, Rfl: 0 .  amLODipine (NORVASC) 5 MG  tablet, Take 1 tablet (5 mg total) by mouth daily for 30 days., Disp: 30 tablet, Rfl: 0 .  ketorolac (TORADOL) 10 MG tablet, Take 1 tablet (10 mg total) by mouth every 6 (six) hours as needed., Disp: 20 tablet, Rfl: 0 .  oxyCODONE-acetaminophen (PERCOCET) 10-325 MG tablet, Take 1 tablet by mouth every 6 (six) hours as needed for up to 8 days for pain., Disp: 30 tablet, Rfl: 0  Social History   Tobacco Use  Smoking Status Current Every Day Smoker  . Packs/day: 0.75  . Years: 28.00  . Pack years: 21.00  . Types: Cigarettes  Smokeless Tobacco Never Used    No Known Allergies Objective:  There were no vitals filed for this visit. There is no height or weight on file to calculate BMI. Constitutional Well  developed. Well nourished.  Vascular Foot warm and well perfused. Capillary refill normal to all digits.   Neurologic Normal speech. Oriented to person, place, and time. Epicritic sensation to light touch grossly present bilaterally.  Dermatologic Skin healing well without signs of infection. Skin edges well coapted without signs of infection.  Orthopedic: Tenderness to palpation noted about the surgical site.   Radiographs: None Assessment:   1. Pain in left foot   2. Plantar fasciitis   3. Equinus deformity of foot   4. Status post foot surgery    Plan:  Patient was evaluated and treated and all questions answered.  S/p foot surgery left -Progressing as expected post-operatively. -XR: None -WB Status: Nonweightbearing in left lower extremity secondary to pain with crutches -Sutures: Intact no clinical signs of infection or dehiscence noted. -Medications: Percocet and Toradol for pain control -Foot redressed.  No follow-ups on file.

## 2019-11-06 ENCOUNTER — Other Ambulatory Visit: Payer: Self-pay

## 2019-11-06 ENCOUNTER — Ambulatory Visit (INDEPENDENT_AMBULATORY_CARE_PROVIDER_SITE_OTHER): Payer: Medicaid Other | Admitting: Podiatry

## 2019-11-06 DIAGNOSIS — M722 Plantar fascial fibromatosis: Secondary | ICD-10-CM

## 2019-11-06 DIAGNOSIS — Z9889 Other specified postprocedural states: Secondary | ICD-10-CM

## 2019-11-06 DIAGNOSIS — M79672 Pain in left foot: Secondary | ICD-10-CM

## 2019-11-06 MED ORDER — OXYCODONE-ACETAMINOPHEN 10-325 MG PO TABS: 1.0000 | ORAL_TABLET | ORAL | 0 refills | Status: DC | PRN

## 2019-11-09 NOTE — Progress Notes (Signed)
  Subjective:  Patient ID: [name], male    DOB: 1960-11-27,  MRN: [id number]  Chief Complaint  Patient presents with  . Routine Post Op    POV #2 DOS 10/21/2019 LT FOOT EPF, POSSIBLE HEEL SPUR RESECTION, EXCISION OF SKIN LESION PLANTAR FOOT. Pt stated, "Still painful, but not as painful as it was during my last appt. 6/10 when I alternate Percocet and Toradol. I'm trying to stay off of my feet, but it's difficult. I've kept my foot dry. The [medial] side keeps draining something that soaks through my ace wrap -we've changed it twice in the past week. I sleep on that side. I wear my boot during the day and when I sleep". No chills/fever/N&V.    DOS: 10/21/19 Procedure: EPF, heel spur resection left  59 y.o. male presents with the above complaint. History confirmed with patient.   Objective:  Physical Exam: tenderness at the surgical site, local edema noted and calf supple, nontender. Incision: slow healing, slight clear drainage present medially, well healed laterally.  Assessment:   1. Plantar fasciitis   2. Pain in left foot   3. Post-operative state     Plan:  Patient was evaluated and treated and all questions answered.  Post-operative State -Wound with some drainage, gently cleansed. -Dressing applied consisting of silvadene, kerlix, ACE bandage and coban -WBAT in CAM boot  Return in about 1 week (around 11/13/2019) for Post-Op (No XRs).

## 2019-11-17 ENCOUNTER — Ambulatory Visit (INDEPENDENT_AMBULATORY_CARE_PROVIDER_SITE_OTHER): Payer: Medicaid Other | Admitting: Podiatry

## 2019-11-17 ENCOUNTER — Other Ambulatory Visit: Payer: Self-pay

## 2019-11-17 DIAGNOSIS — M722 Plantar fascial fibromatosis: Secondary | ICD-10-CM

## 2019-11-17 DIAGNOSIS — Z9889 Other specified postprocedural states: Secondary | ICD-10-CM

## 2019-11-17 MED ORDER — HYDROMORPHONE HCL 2 MG PO TABS: 2.0000 mg | ORAL_TABLET | ORAL | 0 refills | Status: DC | PRN

## 2019-11-17 NOTE — Progress Notes (Signed)
  Subjective:  Patient ID: [name], male    DOB: 1960-06-18,  MRN: [id number]  Chief Complaint  Patient presents with  . Routine Post Op    POV#3 DOS 7.28.2021 Pt states still experiencing soreness but has had some minor improvement. Denies fever/nausea/vomiting/chills.    DOS: 10/21/19 Procedure: EPF, heel spur resection left, excision of skin lesion   59 y.o. male presents with the above complaint. History confirmed with patient. Doing better, slowly. Wearing CAM boot.  Objective:  Physical Exam: tenderness at the surgical site, local edema noted and calf supple, nontender. Incision: healing well, slight clear drainage present, dehiscence present 0.3 cm diameter medially without warmth erythema signs of infection.  Assessment:   1. Plantar fasciitis   2. Status post foot surgery     Plan:  Patient was evaluated and treated and all questions answered.  Post-operative State -Sutures removed -Ok to start showering at this time. Advised they cannot soak. -Dressing applied consisting of silvadene and band-aid -WBAT in CAM boot  -Refer back to PT  No follow-ups on file.

## 2019-11-18 ENCOUNTER — Telehealth: Payer: Self-pay | Admitting: Podiatrist

## 2019-11-18 MED ORDER — OXYCODONE-ACETAMINOPHEN 10-325 MG PO TABS: 1.0000 | ORAL_TABLET | ORAL | 0 refills | Status: DC | PRN

## 2019-11-18 NOTE — Telephone Encounter (Signed)
I refilled his Oxycodone. He can stop taking the dilaudid

## 2019-11-18 NOTE — Telephone Encounter (Signed)
Refilled oxycodone.

## 2019-11-18 NOTE — Telephone Encounter (Signed)
Dr. March Rummage called in oxycodone/ acetaminophen 10/325 to [facility] in [place].  A prior authorization was faxed to our office.  I filled out the prior authorization through covermymeds and faxed back.   I called and informed patient of this and he will call his pharmacy to see if/when the new medication is approved.  He relates the dilaudid 2mg  doesn't help and I told him he could take up to 2 pills (4mg ) every 6 hours if he would like to try to see if that would be beneficial.  He would prefer to take the oxycodone as he has taken it in the past and did OK with it.    He will call back if he has any trouble getting the new medication.  -done-

## 2019-11-18 NOTE — Telephone Encounter (Signed)
Patient called wanting a different pain medication than was prescribed.  States he can't take what was prescribed and wants Oxy(something).  How would you like me to reply?  Thanks!

## 2019-11-18 NOTE — Telephone Encounter (Signed)
Thank you for taking care of all this

## 2019-11-20 ENCOUNTER — Encounter: Payer: Medicaid Other | Admitting: Podiatry

## 2019-12-01 ENCOUNTER — Ambulatory Visit (INDEPENDENT_AMBULATORY_CARE_PROVIDER_SITE_OTHER): Payer: Medicare Other | Admitting: Podiatry

## 2019-12-01 ENCOUNTER — Other Ambulatory Visit: Payer: Self-pay

## 2019-12-01 DIAGNOSIS — Z9889 Other specified postprocedural states: Secondary | ICD-10-CM

## 2019-12-01 DIAGNOSIS — M722 Plantar fascial fibromatosis: Secondary | ICD-10-CM

## 2019-12-01 MED ORDER — OXYCODONE-ACETAMINOPHEN 10-325 MG PO TABS: 1.0000 | ORAL_TABLET | ORAL | 0 refills | Status: DC | PRN

## 2019-12-11 MED ORDER — OXYCODONE-ACETAMINOPHEN 10-325 MG PO TABS: 1.0000 | ORAL_TABLET | ORAL | 0 refills | Status: DC | PRN

## 2019-12-11 NOTE — Telephone Encounter (Signed)
Pt needs pain medication refill.

## 2019-12-14 ENCOUNTER — Telehealth: Payer: Self-pay

## 2019-12-14 NOTE — Telephone Encounter (Signed)
Pt called for a refill on pain medication.

## 2019-12-15 MED ORDER — OXYCODONE-ACETAMINOPHEN 10-325 MG PO TABS: 1.0000 | ORAL_TABLET | ORAL | 0 refills | Status: DC | PRN

## 2019-12-18 ENCOUNTER — Other Ambulatory Visit: Payer: Self-pay | Admitting: Podiatry

## 2019-12-18 MED ORDER — OXYCODONE-ACETAMINOPHEN 10-325 MG PO TABS: 1.0000 | ORAL_TABLET | ORAL | 0 refills | Status: DC | PRN

## 2019-12-18 NOTE — Progress Notes (Signed)
Refill sent to pharmacy.   

## 2019-12-24 NOTE — Progress Notes (Signed)
  Subjective:  Patient ID: [name], male    DOB: [birth year],  MRN: [id number]  Chief Complaint  Patient presents with  . Foot Pain    callouses in the center of foot painful to the touch, chronic pain , denies fever/chills and nausea and vomitting     DOS: 10/21/19 Procedure: EPF, heel spur resection left, excision of skin lesion   59 y.o. male presents with the above complaint. History confirmed with patient.  States that he was getting better having more pain in the cast today.  Request refill of pain medication  Objective:  Physical Exam: tenderness at the surgical site, local edema noted and calf supple, nontender. Incision: healing well, slight clear drainage present, dehiscence present 0.3 cm diameter medially without warmth erythema signs of infection.  Assessment:   1. Plantar fasciitis   2. Status post foot surgery     Plan:  Patient was evaluated and treated and all questions answered.  Post-operative State -Continue PT. -Transition to surgical shoe with heel cushion -Pain medication refilled  Return in about 1 month (around 12/31/2019) for Post-Op (No XRs).

## 2019-12-29 ENCOUNTER — Other Ambulatory Visit: Payer: Self-pay | Admitting: Podiatry

## 2019-12-29 ENCOUNTER — Ambulatory Visit (INDEPENDENT_AMBULATORY_CARE_PROVIDER_SITE_OTHER): Payer: Medicare Other

## 2019-12-29 ENCOUNTER — Other Ambulatory Visit: Payer: Self-pay

## 2019-12-29 ENCOUNTER — Ambulatory Visit (INDEPENDENT_AMBULATORY_CARE_PROVIDER_SITE_OTHER): Payer: Medicare Other | Admitting: Podiatry

## 2019-12-29 DIAGNOSIS — M79671 Pain in right foot: Secondary | ICD-10-CM | POA: Diagnosis not present

## 2019-12-29 DIAGNOSIS — G5782 Other specified mononeuropathies of left lower limb: Secondary | ICD-10-CM | POA: Diagnosis not present

## 2019-12-29 DIAGNOSIS — M722 Plantar fascial fibromatosis: Secondary | ICD-10-CM

## 2019-12-29 DIAGNOSIS — M795 Residual foreign body in soft tissue: Secondary | ICD-10-CM | POA: Diagnosis not present

## 2019-12-29 MED ORDER — OXYCODONE-ACETAMINOPHEN 10-325 MG PO TABS
1.0000 | ORAL_TABLET | ORAL | 0 refills | Status: DC | PRN
Start: 2019-12-29 — End: 2020-01-08

## 2019-12-29 NOTE — Progress Notes (Signed)
  Subjective:  Patient ID: [name], male    DOB: 1960-09-27,  MRN: [id number]  Chief Complaint  Patient presents with  . Foreign Body    Right 1st stepped on glass 3 weeks ago, painful to touch.  . Routine Post Op    POV 7.28.2021 Pt states still having extreme pain.    DOS: 10/21/19 Procedure: EPF, heel spur resection left, excision of skin lesion   59 y.o. male presents with the above complaint. History confirmed with patient.  Still in a lot of pain with the numbness to the outside of the foot.  Healed is doing a little better.  New complaint of stepping on a piece of glass causing pain to the right great toe Objective:  Physical Exam: Only mild tenderness remaining at the plantar heel.  Sensation intact to the proximal sural nerve distribution of the leg and at the fourth and fifth toes with central area of decreased sensation.  Tinel's sign over the incision with distal paresthesia Right great toe puncture site with evident glass upon debridement  incision: Well-healed.  Assessment:   1. Pain in right foot   2. Plantar fasciitis   3. Sural neuritis, left   4. Residual foreign body in soft tissue    Plan:  Patient was evaluated and treated and all questions answered.  Post-operative State -Continue PT. -Transition to surgical shoe with heel cushion -Pain medication refilled  Sural neuritis -He is still experiencing significant symptomatic neuritis although it has made a significant improvement since the decompression.  Would consider possible repeat nerve decompression if pain persist.  Injection today   Procedure: Neuroma Injection Location: Sural neuroma at the previous incision Skin Prep: Alcohol. Injectate: 0.5 cc 0.5% marcaine plain, 0.5 cc celestone Disposition: Patient tolerated procedure well. Injection site dressed with a band-aid.  Foreign body right hallux -After sterile skin prep with alcohol the area was gently debrided with a 15 blade and 312  blade.  There was a piece of glass that was noted in the wound bed which was removed with forceps.  Area was cleansed and Neosporin and Band-Aid applied  No follow-ups on file.

## 2020-01-07 ENCOUNTER — Telehealth: Payer: Self-pay

## 2020-01-07 NOTE — Telephone Encounter (Signed)
Patient called, requesting a refill of his pain medicine - he will run out on Saturday Please call to advise [phone]

## 2020-01-08 MED ORDER — OXYCODONE-ACETAMINOPHEN 10-325 MG PO TABS
1.0000 | ORAL_TABLET | ORAL | 0 refills | Status: DC | PRN
Start: 2020-01-08 — End: 2020-01-08

## 2020-01-08 MED ORDER — OXYCODONE-ACETAMINOPHEN 10-325 MG PO TABS
1.0000 | ORAL_TABLET | ORAL | 0 refills | Status: DC | PRN
Start: 2020-01-08 — End: 2020-01-25

## 2020-01-08 NOTE — Telephone Encounter (Signed)
Sent!

## 2020-01-22 ENCOUNTER — Ambulatory Visit (INDEPENDENT_AMBULATORY_CARE_PROVIDER_SITE_OTHER): Payer: Medicare Other | Admitting: Podiatry

## 2020-01-22 ENCOUNTER — Other Ambulatory Visit: Payer: Self-pay

## 2020-01-22 DIAGNOSIS — M7651 Patellar tendinitis, right knee: Secondary | ICD-10-CM | POA: Diagnosis not present

## 2020-01-22 DIAGNOSIS — G5782 Other specified mononeuropathies of left lower limb: Secondary | ICD-10-CM | POA: Diagnosis not present

## 2020-01-22 DIAGNOSIS — M25561 Pain in right knee: Secondary | ICD-10-CM

## 2020-01-22 NOTE — Progress Notes (Signed)
  Subjective:  Patient ID: [name], male    DOB: 09-Feb-1961,  MRN: [id number]  Chief Complaint  Patient presents with  . Routine Post Op    3 week f/u POV  DOS 10/21/2019 LT FOOT EPF, POSSIBLE HEEL SPUR RESECTION, EXCISION OF SKIN LESION PLANTAR FOOT    DOS: 10/21/19 Procedure: EPF, heel spur resection left, excision of skin lesion   59 y.o. male presents with the above complaint. History confirmed with patient. States the injection did help and he is limping less because of it.  New pain to the right knee area denies injury. States it is causing some numbness to the top of the foot.  Objective:  Physical Exam: No tenderness remaining at the plantar heel.  Sensation intact to the proximal sural nerve distribution of the leg and at the fourth and fifth toes with central area of decreased sensation.  Tinel's sign over the incision with distal paresthesia   POP right proximal tibia at the tibial tuberosity, edema Assessment:   1. Sural neuritis, left   2. Acute pain of right knee   3. Patellar tendonitis of right knee    Plan:  Patient was evaluated and treated and all questions answered.  Sural neuritis -Some improvement with injection. Will repeat today  Procedure: Neuroma Injection Location: left sural nerve Skin Prep: Alcohol. Injectate: 0.5 cc 0.5% marcaine plain, 1cc celestone Disposition: Patient tolerated procedure well. Injection site dressed with a band-aid.  Right knee pain, poss patellar tendonitis? -Refer to orthopedics  No follow-ups on file.

## 2020-01-25 ENCOUNTER — Other Ambulatory Visit: Payer: Self-pay | Admitting: Podiatry

## 2020-01-25 ENCOUNTER — Telehealth: Payer: Self-pay | Admitting: Podiatry

## 2020-01-25 MED ORDER — OXYCODONE-ACETAMINOPHEN 10-325 MG PO TABS
1.0000 | ORAL_TABLET | ORAL | 0 refills | Status: DC | PRN
Start: 2020-01-25 — End: 2020-02-08

## 2020-01-25 NOTE — Telephone Encounter (Signed)
Pt would like a refill on oxyCODONE-acetaminophen (PERCOCET) 10-325 mg. Please advise.

## 2020-01-25 NOTE — Progress Notes (Signed)
Refill of Oxycodone sent

## 2020-01-27 ENCOUNTER — Ambulatory Visit (INDEPENDENT_AMBULATORY_CARE_PROVIDER_SITE_OTHER): Payer: Medicare Other | Admitting: Orthopaedic Surgery

## 2020-01-27 ENCOUNTER — Encounter: Payer: Self-pay | Admitting: Orthopaedic Surgery

## 2020-01-27 ENCOUNTER — Ambulatory Visit (INDEPENDENT_AMBULATORY_CARE_PROVIDER_SITE_OTHER): Payer: Medicare Other

## 2020-01-27 ENCOUNTER — Other Ambulatory Visit: Payer: Self-pay

## 2020-01-27 DIAGNOSIS — M7651 Patellar tendinitis, right knee: Secondary | ICD-10-CM

## 2020-01-27 DIAGNOSIS — M25561 Pain in right knee: Secondary | ICD-10-CM

## 2020-01-27 DIAGNOSIS — G8929 Other chronic pain: Secondary | ICD-10-CM

## 2020-01-27 MED ORDER — METHYLPREDNISOLONE 4 MG PO TABS: ORAL_TABLET | ORAL | 0 refills | Status: DC

## 2020-01-27 NOTE — Progress Notes (Signed)
Office Visit Note   Patient: [name]           Date of Birth: 1960-12-24           MRN: [id number] Visit Date: 01/27/2020              Requested by: [name], DPM [address],  [place] [address] PCP: [name], MD   Assessment & Plan: Visit Diagnoses:  1. Chronic pain of right knee   2. Patellar tendinitis of right knee     Plan: Recommend placing him on a Medrol Dosepak no NSAIDs while on the Medrol Dosepak.  Also recommend applying Voltaren gel 4 g 4 times daily to the tibial tubercle.  We will also send him to physical therapy for work modalities tissue massage home exercise program.  He will follow up with [place] as needed.  Or if his condition does not improve.  Questions encouraged and answered  Follow-Up Instructions: Return if symptoms worsen or fail to improve.   Orders:  Orders Placed This Encounter  Procedures  . XR Knee 1-2 Views Right   Meds ordered this encounter  Medications  . methylPREDNISolone (MEDROL) 4 MG tablet    Sig: Take as directed    Dispense:  21 tablet    Refill:  0      Procedures: No procedures performed   Clinical Data: No additional findings.   Subjective: Chief Complaint  Patient presents with  . Right Knee - Pain    HPI  [name] is a 59 year old male were seen for the first time for right knee "patellar tendinitis".  He states he has been having problems with this for years.  No known injury.  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  He feels pain is getting worse.  Has tried Advil or Aleve with no relief.  He reports multiple knee arthroscopies on both knees in the past.  Review of Systems  Constitutional: Negative for chills and fever.  Musculoskeletal: Positive for arthralgias.     Objective: Vital Signs: There were no vitals taken for this visit.  Physical Exam Constitutional:      Appearance: He is not ill-appearing or diaphoretic.    Pulmonary:     Effort: Pulmonary effort is normal.  Neurological:     Mental Status: He is alert and oriented to person, place, and time.  Psychiatric:        Mood and Affect: Mood normal.     Ortho Exam Bilateral knees: No abnormal warmth erythema or effusion.  There is prominence of the right tibial tubercle and tenderness.  He is able do a straight leg raise.  Good range of motion of both knees no instability valgus varus stressing of either knee.  McMurray's is negative on the right. Specialty Comments:  No specialty comments available.  Imaging: XR Knee 1-2 Views Right  Result Date: 01/27/2020 Right knee 2 views: No acute fractures mild patella arthritic changes.  Medial lateral joint lines well-maintained.  No evidence of Osgood Slaughter injury.  No acute fractures.    PMFS History: Patient Active Problem List   Diagnosis Date Noted  . Benign skin lesion   . COPD (chronic obstructive pulmonary disease) ([place]) 04/24/2019  . Plantar fasciitis   . Heel spur, left   . Acquired equinus deformity of left foot   . Acute respiratory failure with hypoxia ([place]) 06/10/2018  . COPD with acute exacerbation (  [place]) 06/10/2018  . Community acquired bilateral lower lobe pneumonia 06/10/2018  . Achilles tendinitis, right leg 04/24/2018  . Acute respiratory distress 12/20/2017  . History of noncompliance with medical treatment 12/20/2017  . Acute sinusitis, unspecified 03/15/2017  . Rupture of right distal biceps tendon 03/01/2016  . Pain in right lower leg 01/30/2016  . Chest pain 04/26/2015  . Hypokalemia 04/26/2015  . COPD GOLD 0 / active smoker 04/26/2015  . Pain in the chest   . Fall 07/22/2014  . Multiple fractures of ribs of right side 07/22/2014  . Pneumothorax on right 07/22/2014  . Traumatic pneumohemothorax 07/21/2014  . Altered mental state 04/15/2013  . Acute encephalopathy 04/15/2013  . Difficulty speaking 04/15/2013  . Headache(784.0) 04/15/2013  . Cigarette  smoker 04/15/2013  . HTN (hypertension) 04/15/2013   Past Medical History:  Diagnosis Date  . At risk for sleep apnea    10-14-2019  ---   STOP--BANG SCORE = 5   (screening routed  to pt's pcp in epic)  . Cardiomyopathy ([place]) 2009   per cardiac cath 12-10-2007 ef 35-40%;    last echo 04-27-2015 in ef 55-60%  . COPD (chronic obstructive pulmonary disease) ([place])    previously seen pulmonologist-- dr Melvyn Novas--- GOLD 0-- hx multiple exacerbation's last one 04-23-2019 w/ ARF hypoxia (10-14-2019  per pt followed by pcp;  pt was on symbicort bid and duoneb and rescue inhaler as needed  ,  pt stated has productive smoker's cough but no sob or difficulty breathing;  last used nebulizer one week ago and not used rescue inhaler in along time)  . DDD (degenerative disc disease), cervical    ALL OVER  . History of nuclear stress test    epic 04-08-2015   low risk w/ small inferior wall scar of mild severity focus of reversible ischemia, ef 61%  . Hypertension    followed by pcp  . Plantar fasciitis, left     Family History  Problem Relation Age of Onset  . CAD Father   . Cancer Father   . Bone cancer Mother   . Colon cancer Neg Hx   . Colon polyps Neg Hx   . Esophageal cancer Neg Hx   . Rectal cancer Neg Hx   . Stomach cancer Neg Hx     Past Surgical History:  Procedure Laterality Date  . ACHILLES TENDON REPAIR Left 2015  . BICEPS TENDON REPAIR Right 2018  . CARDIAC CATHETERIZATION  12-10-2007  dr al little   no evidence cad, mild to moderate LVD, ef 35-40%  . GASTROC RECESSION EXTREMITY Left 10/22/2018   Procedure: GASTROC RECESSION EXTREMITY;  Surgeon: [name], DPM;  Location: [facility];  Service: Podiatry;  Laterality: Left;  . HEEL SPUR RESECTION Left 10/22/2018   Procedure: HEEL SPUR RESECTION;  Surgeon: [name], DPM;  Location: [facility];  Service: Podiatry;  Laterality: Left;  . HEEL SPUR RESECTION Left 10/21/2019   Procedure: HEEL SPUR  RESECTION;  Surgeon: [name], DPM;  Location: [facility];  Service: Podiatry;  Laterality: Left;  . HIP SURGERY Left 2005 approx.  Marland Kitchen KNEE SURGERY Bilateral x7   last one 2010  approx.  Marland Kitchen NERVE REPAIR Left 07/01/2019   Procedure: Left leg repair/decompression of sural neuroma;  Surgeon: [name], DPM;  Location: [facility];  Service: Podiatry;  Laterality: Left;  PRONE  . PLANTAR FASCIA RELEASE Left 10/22/2018   Procedure: ENDOSCOPIC PLANTAR FASCIOTOMY;  Surgeon: [name], DPM;  Location: [facility];  Service: Podiatry;  Laterality: Left;  . PLANTAR FASCIA RELEASE Left 10/21/2019   Procedure: ENDOSCOPIC PLANTAR FASCIA RELEASE; EXCISION SKIN LESION;  Surgeon: [name], DPM;  Location: [facility];  Service: Podiatry;  Laterality: Left;   Social History   Occupational History  . Not on file  Tobacco Use  . Smoking status: Current Every Day Smoker    Packs/day: 0.75    Years: 28.00    Pack years: 21.00    Types: Cigarettes  . Smokeless tobacco: Never Used  Vaping Use  . Vaping Use: Never used  Substance and Sexual Activity  . Alcohol use: Yes    Comment: occasional  . Drug use: Never  . Sexual activity: Not on file

## 2020-02-08 ENCOUNTER — Other Ambulatory Visit: Payer: Self-pay | Admitting: Podiatry

## 2020-02-08 MED ORDER — OXYCODONE-ACETAMINOPHEN 10-325 MG PO TABS: 1.0000 | ORAL_TABLET | ORAL | 0 refills | Status: DC | PRN

## 2020-02-08 NOTE — Telephone Encounter (Signed)
Pt called twice this morning stating he still hasn't received a refill on oxycodone-acetaminophen. Please advise.

## 2020-02-08 NOTE — Addendum Note (Signed)
Addended by: Hardie Pulley on: 02/08/2020 02:22 PM   Modules accepted: Orders

## 2020-02-22 ENCOUNTER — Telehealth: Payer: Self-pay | Admitting: Podiatry

## 2020-02-22 NOTE — Telephone Encounter (Signed)
Patient called for a refill on oxycodone-acetaminophen.  He is having severe pain

## 2020-02-23 ENCOUNTER — Telehealth: Payer: Self-pay | Admitting: *Deleted

## 2020-02-23 ENCOUNTER — Other Ambulatory Visit: Payer: Self-pay | Admitting: *Deleted

## 2020-02-23 MED ORDER — OXYCODONE-ACETAMINOPHEN 10-325 MG PO TABS
1.0000 | ORAL_TABLET | ORAL | 0 refills | Status: DC | PRN
Start: 2020-02-23 — End: 2020-03-03

## 2020-02-23 NOTE — Telephone Encounter (Signed)
Patient is requesting f/u appointment  on his left foot(post surgery) severe pain.

## 2020-02-23 NOTE — Telephone Encounter (Signed)
Patient is having severe pain in his left post surgery foot,stated that he has called several times for the past 3 days,no response.Please send a refill for Oxycodone -acetaminophen(10-325mg ).  Spoke with Dr. March Rummage and he said that he will send prescription to pharmacy of choice.   Returned call back to patient to inform,verbally understood.

## 2020-03-03 ENCOUNTER — Telehealth: Payer: Self-pay

## 2020-03-03 MED ORDER — OXYCODONE-ACETAMINOPHEN 10-325 MG PO TABS: 1.0000 | ORAL_TABLET | ORAL | 0 refills | Status: DC | PRN

## 2020-03-03 NOTE — Telephone Encounter (Signed)
Pt would like a refill on pain medication. Please advise  

## 2020-03-15 ENCOUNTER — Telehealth: Payer: Self-pay

## 2020-03-15 MED ORDER — OXYCODONE-ACETAMINOPHEN 10-325 MG PO TABS
1.0000 | ORAL_TABLET | ORAL | 0 refills | Status: DC | PRN
Start: 2020-03-15 — End: 2020-03-22

## 2020-03-15 NOTE — Addendum Note (Signed)
Addended by: Boneta Lucks on: 03/15/2020 03:34 PM   Modules accepted: Orders

## 2020-03-15 NOTE — Telephone Encounter (Signed)
Pt would like a refill on his pain meds before the holidays. Please advise

## 2020-03-15 NOTE — Telephone Encounter (Signed)
done

## 2020-03-22 ENCOUNTER — Telehealth: Payer: Self-pay

## 2020-03-22 MED ORDER — OXYCODONE-ACETAMINOPHEN 10-325 MG PO TABS
1.0000 | ORAL_TABLET | ORAL | 0 refills | Status: DC | PRN
Start: 2020-03-22 — End: 2020-04-01

## 2020-03-22 MED ORDER — PREGABALIN 50 MG PO CAPS
50.0000 mg | ORAL_CAPSULE | Freq: Two times a day (BID) | ORAL | 0 refills | Status: DC
Start: 2020-03-22 — End: 2020-07-05

## 2020-03-22 NOTE — Telephone Encounter (Signed)
Patient called, asking for refills of his medications. He is leaving for [place] tomorrow afternoon for his birthday and needs the refills before he goes out of town. Thanks

## 2020-03-31 ENCOUNTER — Telehealth: Payer: Self-pay

## 2020-03-31 NOTE — Telephone Encounter (Signed)
Pt would like a refill on pain medication. Please advise  

## 2020-04-01 ENCOUNTER — Telehealth: Payer: Self-pay | Admitting: Podiatry

## 2020-04-01 MED ORDER — OXYCODONE-ACETAMINOPHEN 10-325 MG PO TABS
1.0000 | ORAL_TABLET | ORAL | 0 refills | Status: DC | PRN
Start: 2020-04-01 — End: 2020-04-07

## 2020-04-01 NOTE — Telephone Encounter (Signed)
Pt has called about a refill on his pain meds.

## 2020-04-01 NOTE — Addendum Note (Signed)
Addended by: Hardie Pulley on: 04/01/2020 05:50 PM   Modules accepted: Orders

## 2020-04-05 NOTE — Telephone Encounter (Signed)
Sent 04/01/20

## 2020-04-07 ENCOUNTER — Telehealth: Payer: Self-pay | Admitting: *Deleted

## 2020-04-07 MED ORDER — OXYCODONE-ACETAMINOPHEN 10-325 MG PO TABS
1.0000 | ORAL_TABLET | ORAL | 0 refills | Status: DC | PRN
Start: 2020-04-07 — End: 2020-04-14

## 2020-04-07 NOTE — Telephone Encounter (Signed)
Returned call to patient , no answer but  left VM that prescription has been sent to pharmacy on file.

## 2020-04-07 NOTE — Telephone Encounter (Signed)
Patient is requesting a refill of the Oxycodone-acetaminophen 10-325mg . He will take his last dosage at 12:00 tomorrow . Please send prescription to pharmacy of on file.

## 2020-04-12 ENCOUNTER — Ambulatory Visit (INDEPENDENT_AMBULATORY_CARE_PROVIDER_SITE_OTHER): Payer: Medicare Other | Admitting: Podiatry

## 2020-04-12 ENCOUNTER — Other Ambulatory Visit: Payer: Self-pay

## 2020-04-12 DIAGNOSIS — G5782 Other specified mononeuropathies of left lower limb: Secondary | ICD-10-CM | POA: Diagnosis not present

## 2020-04-12 MED ORDER — BETAMETHASONE SOD PHOS & ACET 6 (3-3) MG/ML IJ SUSP
6.0000 mg | Freq: Once | INTRAMUSCULAR | Status: AC
  Administered 2020-04-12: 6 mg

## 2020-04-12 NOTE — Progress Notes (Signed)
  Subjective:  Patient ID: [name], male    DOB: 07-13-1960,  MRN: [id number]  Chief Complaint  Patient presents with  . Numbness    Pt states gradual return of sensation to left lower extremity with some residual numbness, especially in foot.  . Foot Pain    Pt states pain is roughly the same; worsens over the day with activity.    DOS: 10/21/19 Procedure: EPF, heel spur resection left, excision of skin lesion   60 y.o. male presents with the above complaint. History confirmed with patient. States the numbness continues to improve, albeit slowly.  Objective:  Physical Exam: No tenderness remaining at the plantar heel.  Sensation intact to the proximal sural nerve distribution of the leg and at the fourth and fifth toes with central area of decreased sensation.  Tinel's sign over the incision with distal paresthesia. Palpable mass at the posterior medial calf.  Assessment:   1. Sural neuritis, left    Plan:  Patient was evaluated and treated and all questions answered.  Sural neuritis -Continues to improve with injections. Repeat injection today -Should palpable mass remain in the future consider MRI.  Procedure: Neuroma Injection Location: left sural nerve Skin Prep: Alcohol. Injectate: 0.5 cc 0.5% marcaine plain, 1cc celestone Disposition: Patient tolerated procedure well. Injection site dressed with a band-aid.  Return in about 6 weeks (around 05/24/2020) for Nerve pain f/u.

## 2020-04-14 ENCOUNTER — Telehealth: Payer: Self-pay | Admitting: Podiatry

## 2020-04-14 MED ORDER — OXYCODONE-ACETAMINOPHEN 10-325 MG PO TABS: 1.0000 | ORAL_TABLET | ORAL | 0 refills | Status: DC | PRN

## 2020-04-14 NOTE — Addendum Note (Signed)
Addended by: Hardie Pulley on: 04/14/2020 11:10 AM   Modules accepted: Orders

## 2020-04-14 NOTE — Telephone Encounter (Signed)
Pt lvm on general mailbox, he would like his pain medication refilled.

## 2020-04-18 NOTE — Telephone Encounter (Signed)
Informed patient

## 2020-04-28 ENCOUNTER — Telehealth: Payer: Self-pay | Admitting: Podiatry

## 2020-04-28 MED ORDER — OXYCODONE-ACETAMINOPHEN 10-325 MG PO TABS
1.0000 | ORAL_TABLET | ORAL | 0 refills | Status: DC | PRN
Start: 2020-04-28 — End: 2020-05-13

## 2020-04-28 NOTE — Telephone Encounter (Signed)
Pt requested refill on oxycodone 10-325. Please advise.

## 2020-04-28 NOTE — Addendum Note (Signed)
Addended by: Hardie Pulley on: 04/28/2020 04:03 PM   Modules accepted: Orders

## 2020-04-29 ENCOUNTER — Telehealth: Payer: Self-pay | Admitting: Podiatry

## 2020-04-29 NOTE — Telephone Encounter (Signed)
Left voice message to inform his refill has been sent

## 2020-05-12 ENCOUNTER — Telehealth: Payer: Self-pay

## 2020-05-12 NOTE — Telephone Encounter (Signed)
Pain medication refill 

## 2020-05-13 MED ORDER — OXYCODONE-ACETAMINOPHEN 10-325 MG PO TABS: 1.0000 | ORAL_TABLET | ORAL | 0 refills | Status: DC | PRN

## 2020-05-13 NOTE — Telephone Encounter (Signed)
Patient was called and informed.

## 2020-05-24 ENCOUNTER — Ambulatory Visit (INDEPENDENT_AMBULATORY_CARE_PROVIDER_SITE_OTHER): Payer: Medicare Other | Admitting: Podiatry

## 2020-05-24 ENCOUNTER — Telehealth: Payer: Self-pay | Admitting: Podiatry

## 2020-05-24 DIAGNOSIS — Z5329 Procedure and treatment not carried out because of patient's decision for other reasons: Secondary | ICD-10-CM

## 2020-05-24 MED ORDER — OXYCODONE-ACETAMINOPHEN 10-325 MG PO TABS: 1.0000 | ORAL_TABLET | ORAL | 0 refills | Status: DC | PRN

## 2020-05-24 NOTE — H&P (View-Only) (Signed)
No show for appt. 

## 2020-05-24 NOTE — Telephone Encounter (Signed)
Patient has requested refill on pain meds, Please Advise 

## 2020-05-24 NOTE — Progress Notes (Signed)
No show for appt. 

## 2020-05-24 NOTE — Addendum Note (Signed)
Addended by: Hardie Pulley on: 05/24/2020 12:17 PM   Modules accepted: Orders

## 2020-05-24 NOTE — Addendum Note (Signed)
Addended by: Hardie Pulley on: 05/24/2020 11:06 AM   Modules accepted: Orders

## 2020-05-25 ENCOUNTER — Telehealth: Payer: Self-pay | Admitting: Podiatry

## 2020-05-25 NOTE — Telephone Encounter (Signed)
Patient calling to inform Dr. March Rummage that when he went to pick up his Rx for oxyCODONE-acetaminophen (PERCOCET) 10-325 MG, he was told by the pharmacist that the medication refill required  prior authorization before he could pick it up. Please advise.

## 2020-05-26 ENCOUNTER — Telehealth: Payer: Self-pay | Admitting: Podiatry

## 2020-05-26 NOTE — Telephone Encounter (Signed)
Patient called inquiring about meds, the transmission for prescription failed, Also patient stated the pharmacy has sent in requests as well, Please Advise

## 2020-05-26 NOTE — Telephone Encounter (Signed)
Can we attempt a prior auth?

## 2020-05-27 ENCOUNTER — Ambulatory Visit: Payer: Self-pay | Admitting: Podiatry

## 2020-05-27 ENCOUNTER — Other Ambulatory Visit: Payer: Self-pay

## 2020-05-27 ENCOUNTER — Ambulatory Visit (INDEPENDENT_AMBULATORY_CARE_PROVIDER_SITE_OTHER): Payer: Medicare Other | Admitting: Podiatry

## 2020-05-27 DIAGNOSIS — M792 Neuralgia and neuritis, unspecified: Secondary | ICD-10-CM | POA: Diagnosis not present

## 2020-05-27 DIAGNOSIS — G5782 Other specified mononeuropathies of left lower limb: Secondary | ICD-10-CM | POA: Diagnosis not present

## 2020-05-27 DIAGNOSIS — M79605 Pain in left leg: Secondary | ICD-10-CM

## 2020-05-27 MED ORDER — OXYCODONE-ACETAMINOPHEN 10-325 MG PO TABS: 1.0000 | ORAL_TABLET | ORAL | 0 refills | Status: DC | PRN

## 2020-05-31 ENCOUNTER — Ambulatory Visit: Payer: Medicare Other | Admitting: Podiatry

## 2020-06-06 ENCOUNTER — Telehealth: Payer: Self-pay | Admitting: *Deleted

## 2020-06-06 MED ORDER — OXYCODONE-ACETAMINOPHEN 10-325 MG PO TABS: 1.0000 | ORAL_TABLET | ORAL | 0 refills | Status: DC | PRN

## 2020-06-06 NOTE — Telephone Encounter (Signed)
Patient is requesting a refill of the oxycodone -ace 10-325 mg pain medicine, only one more day remaining. Please advise.

## 2020-06-08 NOTE — Telephone Encounter (Signed)
Called and informed patient that medication request had been sent to pharmacy on file.

## 2020-06-09 ENCOUNTER — Encounter (HOSPITAL_BASED_OUTPATIENT_CLINIC_OR_DEPARTMENT_OTHER): Payer: Self-pay | Admitting: Podiatry

## 2020-06-10 ENCOUNTER — Other Ambulatory Visit: Payer: Self-pay

## 2020-06-10 ENCOUNTER — Encounter (HOSPITAL_BASED_OUTPATIENT_CLINIC_OR_DEPARTMENT_OTHER): Payer: Self-pay | Admitting: Podiatry

## 2020-06-10 NOTE — Progress Notes (Addendum)
Spoke w/ via phone for pre-op interview--- PT Lab needs dos----  Istat and EKG             Lab results------ no COVID test ------ 06-11-2020 @ 0915 Arrive at ------- 0530 on 06-15-2020 NPO after MN NO Solid Food.  Clear liquids from MN until--- 0430  Med rec completed Medications to take morning of surgery ----- Symbicort inhaler and nebulizer Diabetic medication ----- n/a  Patient instructed to bring photo id and insurance card day of surgery Patient aware to have Driver (ride ) / caregiver    for 24 hours after surgery-- daughter, [name]  Patient Special Instructions ----- pt to do nebulizer night before surgery and morning of surgery, asked to bring rescue inhaler dos  Pre-Op special Istructions ----- have not received pt's pcp H&P yet.  Pt stated he has pcp appointment Monday 06-13-2020.  Patient verbalized understanding of instructions that were given at this phone interview. Patient denies shortness of breath, chest pain, fever, cough at this phone interview.   Anesthesia :  HTN;  COPD (per pt last exacerbation several months ago);  Cardiomyopathy (dx 2009 30-35%, last echo 2017 ef 55-60%;  Pt states has productive smoker's cough, denies any sob or difficulty breathing going up stairs or normal activities. Pt stated has not used rescue inhaler in a long time and last used nebulizer one week ago. Pt has been instructed to do nebulizer HS before and AM of surgery.  PCP:  Dr Ritta Slot (pt stated has appt 06-13-2020 for H&P) Cardiologist :  None Pulmonology:  Previously seen by Dr Melvyn Novas (lov 03-08-2017 epic) Chest x-ray :  04-23-2019 epic EKG : 04-23-2019 epic Echo : 04-27-2015 epic Stress test:  ETT 2009 , epic Cardiac Cath :  12-10-2007 epic Activity level:  See above Sleep Study/ CPAP :  NO/ Stop-Bang score 5) Fasting Blood Sugar :      / Checks Blood Sugar -- times a day:  N/A Blood Thinner/ Instructions Maryjane Hurter Dose: NO ASA / Instructions/ Last Dose : NO

## 2020-06-11 ENCOUNTER — Inpatient Hospital Stay (HOSPITAL_COMMUNITY): Admission: RE | Admit: 2020-06-11 | Payer: Medicare Other | Source: Ambulatory Visit

## 2020-06-13 NOTE — Progress Notes (Addendum)
H & P / medical clearance note dated 06-13-2020 donna fox odom fnp on chart for 06-15-2020 surgery  SPOKE WITH BEVERLY HARRELSON RN PT WENT TO LABUER OFFICE 06-13-2020 FOR PCR COVID TEST, OK TO USE FOR 06-15-2020 SURGERY, RESULTS ON CHART NEGATIVE

## 2020-06-14 NOTE — Anesthesia Preprocedure Evaluation (Addendum)
Anesthesia Evaluation  Patient identified by MRN, date of birth, ID band Patient awake    Reviewed: Allergy & Precautions, NPO status , Patient's Chart, lab work & pertinent test results  Airway Mallampati: II  TM Distance: >3 FB Neck ROM: Full    Dental  (+) Teeth Intact,    Pulmonary COPD,  COPD inhaler, Current Smoker,    Pulmonary exam normal        Cardiovascular hypertension, Pt. on medications +CHF   Rhythm:Regular Rate:Normal     Neuro/Psych  Headaches, negative psych ROS   GI/Hepatic negative GI ROS, Neg liver ROS,   Endo/Other  negative endocrine ROS  Renal/GU negative Renal ROS  negative genitourinary   Musculoskeletal  (+) Arthritis , Osteoarthritis,  Sural neuroma    Abdominal (+)  Abdomen: soft. Bowel sounds: normal.  Peds  Hematology negative hematology ROS (+)   Anesthesia Other Findings   Reproductive/Obstetrics                          Anesthesia Physical Anesthesia Plan  ASA: II  Anesthesia Plan: General   Post-op Pain Management:    Induction: Intravenous  PONV Risk Score and Plan: 1 and Ondansetron, Dexamethasone, Midazolam and Treatment may vary due to age or medical condition  Airway Management Planned: Mask and Oral ETT  Additional Equipment: None  Intra-op Plan:   Post-operative Plan: Extubation in OR  Informed Consent: I have reviewed the patients History and Physical, chart, labs and discussed the procedure including the risks, benefits and alternatives for the proposed anesthesia with the patient or authorized representative who has indicated his/her understanding and acceptance.     Dental advisory given  Plan Discussed with: CRNA  Anesthesia Plan Comments: ( ECHO 2017: - Left ventricle: The cavity size was normal. Wall thickness was  normal. Systolic function was normal. The estimated ejection  fraction was in the range of 55% to 60%.  Wall motion was normal;  there were no regional wall motion abnormalities. Left  ventricular diastolic function parameters were normal.  - Right atrium: The atrium was mildly dilated.  Lab Results      Component                Value               Date                      WBC                      17.2 (H)            04/25/2019                HGB                      15.6                06/15/2020                HCT                      46.0                06/15/2020                MCV  93.2                04/25/2019                PLT                      225                 04/25/2019           Lab Results      Component                Value               Date                      NA                       139                 06/15/2020                K                        3.8                 06/15/2020                CO2                      22                  04/25/2019                GLUCOSE                  132 (H)             06/15/2020                BUN                      10                  06/15/2020                CREATININE               0.90                06/15/2020                CALCIUM                  9.0                 04/25/2019                GFRNONAA                 >60                 04/25/2019                GFRAA                    >60  04/25/2019          )       Anesthesia Quick Evaluation

## 2020-06-15 ENCOUNTER — Ambulatory Visit (HOSPITAL_BASED_OUTPATIENT_CLINIC_OR_DEPARTMENT_OTHER): Payer: Medicare Other | Admitting: Certified Registered Nurse Anesthetist

## 2020-06-15 ENCOUNTER — Encounter (HOSPITAL_BASED_OUTPATIENT_CLINIC_OR_DEPARTMENT_OTHER)
Admission: RE | Disposition: A | Discharge status: Home / Self Care | Payer: Self-pay | Source: Home / Self Care | Attending: Podiatry

## 2020-06-15 ENCOUNTER — Encounter (HOSPITAL_BASED_OUTPATIENT_CLINIC_OR_DEPARTMENT_OTHER): Payer: Self-pay | Admitting: Podiatry

## 2020-06-15 ENCOUNTER — Ambulatory Visit (HOSPITAL_BASED_OUTPATIENT_CLINIC_OR_DEPARTMENT_OTHER)
Admission: RE | Admit: 2020-06-15 | Discharge: 2020-06-15 | Disposition: A | Discharge status: Home / Self Care | Payer: Medicare Other | Attending: Podiatry | Admitting: Podiatry

## 2020-06-15 ENCOUNTER — Encounter: Payer: Self-pay | Admitting: Podiatry

## 2020-06-15 ENCOUNTER — Other Ambulatory Visit: Payer: Self-pay

## 2020-06-15 DIAGNOSIS — F17210 Nicotine dependence, cigarettes, uncomplicated: Secondary | ICD-10-CM | POA: Diagnosis not present

## 2020-06-15 DIAGNOSIS — G5782 Other specified mononeuropathies of left lower limb: Secondary | ICD-10-CM | POA: Diagnosis not present

## 2020-06-15 DIAGNOSIS — M792 Neuralgia and neuritis, unspecified: Secondary | ICD-10-CM | POA: Diagnosis not present

## 2020-06-15 DIAGNOSIS — D3613 Benign neoplasm of peripheral nerves and autonomic nervous system of lower limb, including hip: Secondary | ICD-10-CM | POA: Insufficient documentation

## 2020-06-15 DIAGNOSIS — Z79899 Other long term (current) drug therapy: Secondary | ICD-10-CM | POA: Diagnosis not present

## 2020-06-15 HISTORY — DX: Benign neoplasm of peripheral nerves and autonomic nervous system of lower limb, including hip: D36.13

## 2020-06-15 HISTORY — PX: NERVE REPAIR: SHX2083

## 2020-06-15 LAB — POCT I-STAT, CHEM 8
BUN: 10 mg/dL (ref 6–20)
Calcium, Ion: 1.15 mmol/L (ref 1.15–1.40)
Chloride: 106 mmol/L (ref 98–111)
Creatinine, Ser: 0.9 mg/dL (ref 0.61–1.24)
Glucose, Bld: 132 mg/dL — ABNORMAL HIGH (ref 70–99)
HCT: 46 % (ref 39.0–52.0)
Hemoglobin: 15.6 g/dL (ref 13.0–17.0)
Potassium: 3.8 mmol/L (ref 3.5–5.1)
Sodium: 139 mmol/L (ref 135–145)
TCO2: 22 mmol/L (ref 22–32)

## 2020-06-15 SURGERY — REPAIR, NERVE
Anesthesia: General | Laterality: Left

## 2020-06-15 MED ORDER — HYDROMORPHONE HCL 1 MG/ML IJ SOLN: 0.2500 mg | INTRAMUSCULAR | Status: DC | PRN

## 2020-06-15 MED ORDER — PROPOFOL 10 MG/ML IV BOLUS
INTRAVENOUS | Status: AC
  Filled 2020-06-15: qty 20

## 2020-06-15 MED ORDER — ONDANSETRON HCL 4 MG/2ML IJ SOLN
INTRAMUSCULAR | Status: DC | PRN
  Administered 2020-06-15: 4 mg via INTRAVENOUS

## 2020-06-15 MED ORDER — BUPIVACAINE HCL (PF) 0.5 % IJ SOLN
INTRAMUSCULAR | Status: DC | PRN
  Administered 2020-06-15: 9 mL

## 2020-06-15 MED ORDER — OXYCODONE HCL 5 MG/5ML PO SOLN: 5.0000 mg | Freq: Once | ORAL | Status: AC | PRN

## 2020-06-15 MED ORDER — ACETAMINOPHEN 10 MG/ML IV SOLN: 1000.0000 mg | Freq: Once | INTRAVENOUS | Status: DC | PRN

## 2020-06-15 MED ORDER — KETOROLAC TROMETHAMINE 30 MG/ML IJ SOLN
INTRAMUSCULAR | Status: AC
  Filled 2020-06-15: qty 2

## 2020-06-15 MED ORDER — DEXAMETHASONE SODIUM PHOSPHATE 4 MG/ML IJ SOLN
INTRAMUSCULAR | Status: DC | PRN
  Administered 2020-06-15: 10 mg via INTRAVENOUS

## 2020-06-15 MED ORDER — CEFAZOLIN SODIUM-DEXTROSE 2-4 GM/100ML-% IV SOLN: 2.0000 g | INTRAVENOUS | Status: DC

## 2020-06-15 MED ORDER — KETOROLAC TROMETHAMINE 30 MG/ML IJ SOLN
INTRAMUSCULAR | Status: AC
  Filled 2020-06-15: qty 1

## 2020-06-15 MED ORDER — OXYCODONE HCL 5 MG PO TABS
ORAL_TABLET | ORAL | Status: AC
  Filled 2020-06-15: qty 1

## 2020-06-15 MED ORDER — EPHEDRINE 5 MG/ML INJ
INTRAVENOUS | Status: AC
  Filled 2020-06-15: qty 10

## 2020-06-15 MED ORDER — SUGAMMADEX SODIUM 200 MG/2ML IV SOLN
INTRAVENOUS | Status: DC | PRN
  Administered 2020-06-15: 200 mg via INTRAVENOUS

## 2020-06-15 MED ORDER — CEFAZOLIN SODIUM-DEXTROSE 1-4 GM/50ML-% IV SOLN
INTRAVENOUS | Status: AC
  Filled 2020-06-15: qty 50

## 2020-06-15 MED ORDER — CEFAZOLIN SODIUM-DEXTROSE 2-4 GM/100ML-% IV SOLN
INTRAVENOUS | Status: AC
  Filled 2020-06-15: qty 100

## 2020-06-15 MED ORDER — LACTATED RINGERS IV SOLN
INTRAVENOUS | Status: DC
  Administered 2020-06-15: 1000 mL via INTRAVENOUS

## 2020-06-15 MED ORDER — ROCURONIUM BROMIDE 10 MG/ML (PF) SYRINGE
PREFILLED_SYRINGE | INTRAVENOUS | Status: DC | PRN
  Administered 2020-06-15: 60 mg via INTRAVENOUS

## 2020-06-15 MED ORDER — MIDAZOLAM HCL 5 MG/5ML IJ SOLN
INTRAMUSCULAR | Status: DC | PRN
  Administered 2020-06-15: 2 mg via INTRAVENOUS

## 2020-06-15 MED ORDER — FENTANYL CITRATE (PF) 250 MCG/5ML IJ SOLN
INTRAMUSCULAR | Status: AC
  Filled 2020-06-15: qty 5

## 2020-06-15 MED ORDER — LIDOCAINE 2% (20 MG/ML) 5 ML SYRINGE
INTRAMUSCULAR | Status: DC | PRN
  Administered 2020-06-15: 100 mg via INTRAVENOUS

## 2020-06-15 MED ORDER — PROMETHAZINE HCL 25 MG/ML IJ SOLN: 6.2500 mg | INTRAMUSCULAR | Status: DC | PRN

## 2020-06-15 MED ORDER — KETOROLAC TROMETHAMINE 30 MG/ML IJ SOLN
INTRAMUSCULAR | Status: DC | PRN
  Administered 2020-06-15: 30 mg via INTRAVENOUS

## 2020-06-15 MED ORDER — DEXTROSE 5 % IV SOLN
3.0000 g | Freq: Once | INTRAVENOUS | Status: AC
  Administered 2020-06-15: 3 g via INTRAVENOUS
  Filled 2020-06-15: qty 3

## 2020-06-15 MED ORDER — OXYCODONE HCL 5 MG PO TABS
5.0000 mg | ORAL_TABLET | Freq: Once | ORAL | Status: AC | PRN
  Administered 2020-06-15: 5 mg via ORAL

## 2020-06-15 MED ORDER — MIDAZOLAM HCL 2 MG/2ML IJ SOLN
INTRAMUSCULAR | Status: AC
  Filled 2020-06-15: qty 2

## 2020-06-15 MED ORDER — ROCURONIUM BROMIDE 10 MG/ML (PF) SYRINGE
PREFILLED_SYRINGE | INTRAVENOUS | Status: AC
  Filled 2020-06-15: qty 10

## 2020-06-15 MED ORDER — DEXAMETHASONE SODIUM PHOSPHATE 10 MG/ML IJ SOLN
INTRAMUSCULAR | Status: AC
  Filled 2020-06-15: qty 1

## 2020-06-15 MED ORDER — PROPOFOL 10 MG/ML IV BOLUS
INTRAVENOUS | Status: DC | PRN
  Administered 2020-06-15: 260 mg via INTRAVENOUS

## 2020-06-15 MED ORDER — FENTANYL CITRATE (PF) 100 MCG/2ML IJ SOLN
INTRAMUSCULAR | Status: DC | PRN
  Administered 2020-06-15: 50 ug via INTRAVENOUS
  Administered 2020-06-15: 100 ug via INTRAVENOUS

## 2020-06-15 MED ORDER — ONDANSETRON HCL 4 MG/2ML IJ SOLN
INTRAMUSCULAR | Status: AC
  Filled 2020-06-15: qty 2

## 2020-06-15 MED ORDER — ARTIFICIAL TEARS OPHTHALMIC OINT
TOPICAL_OINTMENT | OPHTHALMIC | Status: AC
  Filled 2020-06-15: qty 3.5

## 2020-06-15 MED ORDER — DEXAMETHASONE SODIUM PHOSPHATE 10 MG/ML IJ SOLN
INTRAMUSCULAR | Status: DC | PRN
  Administered 2020-06-15: 5 mg via INTRAVENOUS

## 2020-06-15 MED ORDER — EPHEDRINE SULFATE-NACL 50-0.9 MG/10ML-% IV SOSY
PREFILLED_SYRINGE | INTRAVENOUS | Status: DC | PRN
  Administered 2020-06-15: 10 mg via INTRAVENOUS

## 2020-06-15 MED ORDER — LIDOCAINE 2% (20 MG/ML) 5 ML SYRINGE
INTRAMUSCULAR | Status: AC
  Filled 2020-06-15: qty 5

## 2020-06-15 MED ORDER — OXYCODONE HCL 15 MG PO TABS: 15.0000 mg | ORAL_TABLET | Freq: Three times a day (TID) | ORAL | 0 refills | Status: DC | PRN

## 2020-06-15 MED ORDER — CEPHALEXIN 500 MG PO CAPS: 500.0000 mg | ORAL_CAPSULE | Freq: Two times a day (BID) | ORAL | 0 refills | Status: DC

## 2020-06-15 SURGICAL SUPPLY — 65 items
ADH SKN CLS APL DERMABOND .7 (GAUZE/BANDAGES/DRESSINGS) ×1
APL PRP STRL LF DISP 70% ISPRP (MISCELLANEOUS) ×1
BANDAGE ESMARK 6X9 LF (GAUZE/BANDAGES/DRESSINGS) ×1 IMPLANT
BLADE HEX COATED 2.75 (ELECTRODE) ×2 IMPLANT
BLADE SURG 15 STRL LF DISP TIS (BLADE) ×2 IMPLANT
BLADE SURG 15 STRL SS (BLADE) ×4
BNDG CMPR 9X6 STRL LF SNTH (GAUZE/BANDAGES/DRESSINGS) ×1
BNDG ELASTIC 3X5.8 VLCR STR LF (GAUZE/BANDAGES/DRESSINGS) ×2 IMPLANT
BNDG ELASTIC 4X5.8 VLCR STR LF (GAUZE/BANDAGES/DRESSINGS) ×1 IMPLANT
BNDG ESMARK 6X9 LF (GAUZE/BANDAGES/DRESSINGS) ×2
BNDG GAUZE ELAST 4 BULKY (GAUZE/BANDAGES/DRESSINGS) ×2 IMPLANT
CHLORAPREP W/TINT 26 (MISCELLANEOUS) ×2 IMPLANT
COVER BACK TABLE 60X90IN (DRAPES) ×2 IMPLANT
COVER WAND RF STERILE (DRAPES) ×2 IMPLANT
CUFF TOURN SGL QUICK 18X4 (TOURNIQUET CUFF) IMPLANT
CUFF TOURN SGL QUICK 24 (TOURNIQUET CUFF)
CUFF TRNQT CYL 24X4X16.5-23 (TOURNIQUET CUFF) IMPLANT
DERMABOND ADVANCED (GAUZE/BANDAGES/DRESSINGS) ×1
DERMABOND ADVANCED .7 DNX12 (GAUZE/BANDAGES/DRESSINGS) ×1 IMPLANT
DRAPE EXTREMITY T 121X128X90 (DISPOSABLE) ×2 IMPLANT
DRAPE U-SHAPE 47X51 STRL (DRAPES) ×2 IMPLANT
DRSG EMULSION OIL 3X3 NADH (GAUZE/BANDAGES/DRESSINGS) ×2 IMPLANT
DRSG PAD ABDOMINAL 8X10 ST (GAUZE/BANDAGES/DRESSINGS) IMPLANT
ELECT REM PT RETURN 9FT ADLT (ELECTROSURGICAL) ×2
ELECTRODE REM PT RTRN 9FT ADLT (ELECTROSURGICAL) ×1 IMPLANT
GAUZE SPONGE 4X4 12PLY STRL (GAUZE/BANDAGES/DRESSINGS) ×3 IMPLANT
GLOVE SRG 8 PF TXTR STRL LF DI (GLOVE) ×1 IMPLANT
GLOVE SURG ENC MOIS LTX SZ7.5 (GLOVE) ×2 IMPLANT
GLOVE SURG UNDER POLY LF SZ8 (GLOVE) ×2
GOWN STRL REUS W/TWL XL LVL3 (GOWN DISPOSABLE) ×2 IMPLANT
KIT TURNOVER CYSTO (KITS) ×2 IMPLANT
NDL HYPO 25X1 1.5 SAFETY (NEEDLE) IMPLANT
NEEDLE HYPO 25X1 1.5 SAFETY (NEEDLE) ×2 IMPLANT
NS IRRIG 1000ML POUR BTL (IV SOLUTION) IMPLANT
PACK BASIN DAY SURGERY FS (CUSTOM PROCEDURE TRAY) ×2 IMPLANT
PAD CAST 4YDX4 CTTN HI CHSV (CAST SUPPLIES) IMPLANT
PADDING CAST ABS 4INX4YD NS (CAST SUPPLIES) ×1
PADDING CAST ABS COTTON 4X4 ST (CAST SUPPLIES) ×1 IMPLANT
PADDING CAST COTTON 4X4 STRL (CAST SUPPLIES) ×2
PENCIL SMOKE EVACUATOR (MISCELLANEOUS) ×2 IMPLANT
SLEEVE SCD COMPRESS KNEE MED (STOCKING) ×2 IMPLANT
STAPLER VISISTAT (STAPLE) ×1 IMPLANT
STOCKINETTE 6  STRL (DRAPES) ×2
STOCKINETTE 6 STRL (DRAPES) ×1 IMPLANT
SUCTION FRAZIER HANDLE 10FR (MISCELLANEOUS) ×2
SUCTION TUBE FRAZIER 10FR DISP (MISCELLANEOUS) IMPLANT
SUT ETHIBOND 2-0 V-5 NDL (SUTURE) ×1 IMPLANT
SUT ETHIBOND 2-0 V-5 NEEDLE (SUTURE) ×2 IMPLANT
SUT ETHILON 3 0 PS 1 (SUTURE) ×2 IMPLANT
SUT ETHILON 4 0 PS 2 18 (SUTURE) ×1 IMPLANT
SUT MERSILENE 4 0 P 3 (SUTURE) IMPLANT
SUT MON AB 5-0 PS2 18 (SUTURE) ×1 IMPLANT
SUT PROLENE 6 0 C 1 30 (SUTURE) ×1 IMPLANT
SUT SILK 3 0 SH 30 (SUTURE) ×2 IMPLANT
SUT VIC AB 0 CT1 36 (SUTURE) IMPLANT
SUT VIC AB 2-0 SH 27 (SUTURE)
SUT VIC AB 2-0 SH 27XBRD (SUTURE) IMPLANT
SUT VIC AB 3-0 FS2 27 (SUTURE) ×1 IMPLANT
SUT VIC AB 3-0 SH 27 (SUTURE) ×2
SUT VIC AB 3-0 SH 27X BRD (SUTURE) IMPLANT
SUT VIC AB 4-0 PS2 18 (SUTURE) ×1 IMPLANT
SYR BULB EAR ULCER 3OZ GRN STR (SYRINGE) ×2 IMPLANT
SYR CONTROL 10ML LL (SYRINGE) IMPLANT
TOWEL OR 17X26 10 PK STRL BLUE (TOWEL DISPOSABLE) ×2 IMPLANT
UNDERPAD 30X36 HEAVY ABSORB (UNDERPADS AND DIAPERS) ×2 IMPLANT

## 2020-06-15 NOTE — H&P (Signed)
Anesthesia H&P Update: History and Physical Exam reviewed; patient is OK for planned anesthetic and procedure. ? ?

## 2020-06-15 NOTE — Anesthesia Postprocedure Evaluation (Signed)
Anesthesia Post Note  Patient: [name]  Procedure(s) Performed: Decompression of sural neuroma LEFT CALF TRANSPOSITION INTO MUSCLE  (Left )     Patient location during evaluation: PACU Anesthesia Type: General Level of consciousness: awake and alert Pain management: pain level controlled Vital Signs Assessment: post-procedure vital signs reviewed and stable Respiratory status: spontaneous breathing, nonlabored ventilation, respiratory function stable and patient connected to nasal cannula oxygen Cardiovascular status: blood pressure returned to baseline and stable Postop Assessment: no apparent nausea or vomiting Anesthetic complications: no   No complications documented.  Last Vitals:  Vitals:   06/15/20 0945 06/15/20 1020  BP: (!) 145/87 (!) 149/97  Pulse: 67 67  Resp: 15 16  Temp:  36.7 C  SpO2: 93% 97%    Last Pain:  Vitals:   06/15/20 1150  TempSrc:   PainSc: 7                  Tim Corriher P Joceline Hinchcliff

## 2020-06-15 NOTE — Brief Op Note (Signed)
06/15/2020  9:00 AM  PATIENT:  [name]  60 y.o. male  PRE-OPERATIVE DIAGNOSIS:  Neuroma leg  POST-OPERATIVE DIAGNOSIS:  Neuroma leg  PROCEDURE:  Procedure(s) with comments: Decompression of sural neuroma (Left) - PRONE  SURGEON:  Surgeon(s) and Role:    * Zillah Alexie, Christian Mate, DPM - Primary    * McDonald, Stephan Minister, DPM  PHYSICIAN ASSISTANT:   ASSISTANTS: none   ANESTHESIA:   general  EBL:  35ml   BLOOD ADMINISTERED:none  DRAINS: none   LOCAL MEDICATIONS USED:  MARCAINE    and Amount: 10 ml  SPECIMEN:   ID Type Source Tests Collected by Time Destination  1 : LEFT LEG MASS Tissue PATH Soft tissue SURGICAL PATHOLOGY [name], DPM 06/15/2020 0829       DISPOSITION OF SPECIMEN:  PATHOLOGY  COUNTS:  YES  TOURNIQUET:   Total Tourniquet Time Documented: Thigh (Left) - 47 minutes Total: Thigh (Left) - 47 minutes   DICTATION: .Viviann Spare Dictation  PLAN OF CARE: Discharge to home after PACU  PATIENT DISPOSITION:  PACU - hemodynamically stable.   Delay start of Pharmacological VTE agent (>24hrs) due to surgical blood loss or risk of bleeding: not applicable

## 2020-06-15 NOTE — Op Note (Incomplete)
  Patient Name: [name] DOB: 08-Jan-1961  MRN: [id number]   Date of Surgery: 06/15/2020  Surgeon: Dr. Hardie Pulley, DPM Assistants: None  Pre-operative Diagnosis:  Sural neuroma left leg Post-operative Diagnosis:  Same Procedures:  1) Left leg decompression of neuroma  2) Transposition of nerve into muscle Pathology/Specimens: ID Type Source Tests Collected by Time Destination  1 : LEFT LEG MASS Tissue PATH Soft tissue SURGICAL PATHOLOGY [name], DPM 06/15/2020 0829    Anesthesia: General Hemostasis:  Total Tourniquet Time Documented: Thigh (Left) - 47 minutes Total: Thigh (Left) - 47 minutes  Estimated Blood Loss: {None/Minimal: 21241} Materials: * No implants in log * Medications: 10 ml marcaine 0.5% plain, 10 mg decadron Complications: none  Indications for Procedure:  This is a 60 y.o. male with a chronic left leg neuroma. ***   Procedure in Detail: Patient was identified in pre-operative holding area. Formal consent was signed and the *** lower extremity was marked. Patient was brought back to the operating room. Anesthesia was induced. The extremity was prepped and draped in the usual sterile fashion. Timeout was taken to confirm patient name, laterality, and procedure prior to incision.   Attention was then directed to the ***  The foot was then dressed with ***. Patient tolerated the procedure well.     Dr. Sherryle Lis was scrubbed and present for the entire procedure, and assisted in retraction, positioning, suturing, and acted as a Paramedic for the procedure.  Disposition: Following a period of post-operative monitoring, patient will be transferred home.

## 2020-06-15 NOTE — Discharge Instructions (Signed)
  After Surgery Instructions   1) If you are recuperating from surgery anywhere other than home, please be sure to leave [place] the number where you can be reached.  2) Go directly home and rest.  3) Keep the operated foot(feet) elevated six inches above the hip when sitting or lying down. This will help control swelling and pain.  4) Support the elevated foot and leg with pillows. DO NOT PLACE PILLOWS UNDER THE KNEE.  5) DO NOT REMOVE or get your bandages WET, unless you were given different instructions by your doctor to do so. This increases the risk of infection.  6) Wear your surgical shoe or surgical boot at all times when you are up on your feet.  7) A limited amount of pain and swelling may occur. The skin may take on a bruised appearance. DO NOT BE ALARMED, THIS IS NORMAL.  8) For slight pain and swelling, apply an ice pack directly over the bandages for 15 minutes only out of each hour of the day. Continue until seen in the office for your first post op visit. DO NOT APPLY ANY FORM OF HEAT TO THE AREA.  9) Have prescriptions filled immediately and take as directed.  10) Drink lots of liquids, water and juice to stay hydrated.  11) CALL IMMEDIATELY IF:  *Bleeding continues until the following day of surgery  *Pain increases and/or does not respond to medication  *Bandages or cast appears to tight  *If your bandage gets wet  *Trip, fall or stump your surgical foot  *If your temperature goes above 101  *If you have ANY questions at all  12) You are expected to be weightbearing after your surgery.   If you need to reach the nurse for any reason, please call: [place]: [phone] [place]: [phone] [place]: [phone]   Post Anesthesia Home Care Instructions  Activity: Get plenty of rest for the remainder of the day. A responsible individual must stay with you for 24 hours following the procedure.  For the next 24 hours, DO NOT: -Drive a  car -Paediatric nurse -Drink alcoholic beverages -Take any medication unless instructed by your physician -Make any legal decisions or sign important papers.  Meals: Start with liquid foods such as gelatin or soup. Progress to regular foods as tolerated. Avoid greasy, spicy, heavy foods. If nausea and/or vomiting occur, drink only clear liquids until the nausea and/or vomiting subsides. Call your physician if vomiting continues.  Special Instructions/Symptoms: Your throat may feel dry or sore from the anesthesia or the breathing tube placed in your throat during surgery. If this causes discomfort, gargle with warm salt water. The discomfort should disappear within 24 hours.  If you had a scopolamine patch placed behind your ear for the management of post- operative nausea and/or vomiting:  1. The medication in the patch is effective for 72 hours, after which it should be removed.  Wrap patch in a tissue and discard in the trash. Wash hands thoroughly with soap and water. 2. You may remove the patch earlier than 72 hours if you experience unpleasant side effects which may include dry mouth, dizziness or visual disturbances. 3. Avoid touching the patch. Wash your hands with soap and water after contact with the patch.

## 2020-06-15 NOTE — Anesthesia Procedure Notes (Signed)
Procedure Name: Intubation Date/Time: 06/15/2020 7:40 AM Performed by: Rogers Blocker, CRNA Pre-anesthesia Checklist: Patient identified, Emergency Drugs available, Suction available and Patient being monitored Patient Re-evaluated:Patient Re-evaluated prior to induction Oxygen Delivery Method: Circle System Utilized Preoxygenation: Pre-oxygenation with 100% oxygen Induction Type: IV induction Ventilation: Mask ventilation without difficulty Laryngoscope Size: Mac and 4 Grade View: Grade I Tube type: Oral Tube size: 7.5 mm Number of attempts: 1 Airway Equipment and Method: Stylet Placement Confirmation: ETT inserted through vocal cords under direct vision,  positive ETCO2 and breath sounds checked- equal and bilateral Secured at: 22 cm Tube secured with: Tape Dental Injury: Teeth and Oropharynx as per pre-operative assessment

## 2020-06-15 NOTE — Progress Notes (Signed)
Multiple phone calls have been made since 10:05a for a ride for the patient. Patient currently still waiting on a ride home.

## 2020-06-15 NOTE — Interval H&P Note (Signed)
History and Physical Interval Note:  06/15/2020 7:29 AM  Sem Urquiza  has presented today for surgery, with the diagnosis of Neuroma leg.  The various methods of treatment have been discussed with the patient and family. After consideration of risks, benefits and other options for treatment, the patient has consented to  Procedure(s) with comments: Decompression of sural neuroma (Left) - PRONE as a surgical intervention.  The patient's history has been reviewed, patient examined, no change in status, stable for surgery.  I have reviewed the patient's chart and labs.  Questions were answered to the patient's satisfaction.     [name]

## 2020-06-15 NOTE — Transfer of Care (Signed)
Immediate Anesthesia Transfer of Care Note  Patient: [name]  Procedure(s) Performed: Decompression of sural neuroma (Left )  Patient Location: PACU  Anesthesia Type:General  Level of Consciousness: awake, alert , oriented and patient cooperative  Airway & Oxygen Therapy: Patient Spontanous Breathing and Patient connected to face mask oxygen  Post-op Assessment: Report given to RN and Post -op Vital signs reviewed and stable  Post vital signs: Reviewed and stable  Last Vitals:  Vitals Value Taken Time  BP 148/91 06/15/20 0909  Temp    Pulse 81 06/15/20 0912  Resp 20 06/15/20 0912  SpO2 100 % 06/15/20 0912  Vitals shown include unvalidated device data.  Last Pain:  Vitals:   06/15/20 0617  TempSrc: Oral  PainSc: 8       Patients Stated Pain Goal: 4 (67/01/10 0349)  Complications: No complications documented.

## 2020-06-16 ENCOUNTER — Encounter (HOSPITAL_BASED_OUTPATIENT_CLINIC_OR_DEPARTMENT_OTHER): Payer: Self-pay | Admitting: Podiatry

## 2020-06-16 LAB — SURGICAL PATHOLOGY

## 2020-06-21 ENCOUNTER — Other Ambulatory Visit: Payer: Self-pay

## 2020-06-21 ENCOUNTER — Ambulatory Visit (INDEPENDENT_AMBULATORY_CARE_PROVIDER_SITE_OTHER): Payer: Medicare Other | Admitting: Podiatry

## 2020-06-21 DIAGNOSIS — G5782 Other specified mononeuropathies of left lower limb: Secondary | ICD-10-CM

## 2020-06-21 DIAGNOSIS — M792 Neuralgia and neuritis, unspecified: Secondary | ICD-10-CM

## 2020-06-21 DIAGNOSIS — Z9889 Other specified postprocedural states: Secondary | ICD-10-CM

## 2020-06-21 MED ORDER — OXYCODONE HCL 15 MG PO TABS: 15.0000 mg | ORAL_TABLET | Freq: Three times a day (TID) | ORAL | 0 refills | Status: DC | PRN

## 2020-06-21 NOTE — Progress Notes (Signed)
  Subjective:  Patient ID: [name], male    DOB: [birth year],  MRN: [id number]  Chief Complaint  Patient presents with  . Routine Post Op    POV #1 DOS 06/15/2020 LEFT POSTERIOR LEG DECOMPRESSION OF NERVE. No NV Fever or chills. Pt states some pain but no other questions or concerns.     DOS: 06/15/20 Procedure: Decompression/excision of neuroma left leg  60 y.o. male presents with the above complaint. History confirmed with patient. States the leg is hurting he thinks that the sensation to his toes is improved compared to preop  Objective:  Physical Exam: tenderness at the surgical site, local edema noted and calf supple, nontender. Incision: healing well, no significant drainage, no dehiscence, no significant erythema Assessment:   1. Sural neuritis, left   2. Neuralgia   3. Post-operative state     Plan:  Patient was evaluated and treated and all questions answered.  Post-operative State -Ok to start showering at this time. Advised they cannot soak. -Dressing applied consisting of povidone and Mepilex border foam -WBAT in Surgical shoe -Pain medication refilled  Return in about 2 weeks (around 07/05/2020) for Post-Op (No XRs).

## 2020-06-23 NOTE — Progress Notes (Signed)
  Subjective:  Patient ID: [name], male    DOB: Jul 26, 1960,  MRN: [id number]  Chief Complaint  Patient presents with  . Follow-up    STILL HAVING PAIN- was in ER last Thursday due to a fall futher evaluation     DOS: 10/21/19 Procedure: EPF, heel spur resection left, excision of skin lesion   60 y.o. male presents with the above complaint. History confirmed with patient.  Endorses hematoma to the right thigh from fall but no new injury to the left foot.  States that the pain is unchanged the injection did help for a little bit of time. Objective:  Physical Exam: No tenderness remaining at the plantar heel.  Sensation intact to the proximal sural nerve distribution of the leg and at the fourth and fifth toes with central area of decreased sensation.  Tinel's sign over the incision with distal paresthesia. Palpable mass at the posterior medial calf.  Assessment:   1. Sural neuritis, left   2. Neuralgia   3. Pain in left leg    Plan:  Patient was evaluated and treated and all questions answered.  Sural neuritis -He is having worsening pain at the area will continue Tinel's sign.  At this point I think decompression of the nerve is warranted.  Discussed risk benefits and alternatives of surgery. -Patient has failed all conservative therapy and wishes to proceed with surgical intervention. All risks, benefits, and alternatives discussed with patient. No guarantees given. Consent reviewed and signed by patient. -Planned procedures: Decompression sural neuroma left -ASA 3 - Patient with moderate systemic disease with functional limitations -Post-op anticoagulation: chemoprophylaxis not indicated  No follow-ups on file.

## 2020-07-05 ENCOUNTER — Ambulatory Visit (INDEPENDENT_AMBULATORY_CARE_PROVIDER_SITE_OTHER): Payer: Medicare Other | Admitting: Podiatry

## 2020-07-05 ENCOUNTER — Other Ambulatory Visit: Payer: Self-pay

## 2020-07-05 DIAGNOSIS — M792 Neuralgia and neuritis, unspecified: Secondary | ICD-10-CM

## 2020-07-05 DIAGNOSIS — G5782 Other specified mononeuropathies of left lower limb: Secondary | ICD-10-CM

## 2020-07-05 DIAGNOSIS — M79605 Pain in left leg: Secondary | ICD-10-CM

## 2020-07-05 MED ORDER — PREGABALIN 50 MG PO CAPS: 50.0000 mg | ORAL_CAPSULE | Freq: Two times a day (BID) | ORAL | 0 refills | Status: DC

## 2020-07-05 MED ORDER — OXYCODONE HCL 15 MG PO TABS: 15.0000 mg | ORAL_TABLET | Freq: Three times a day (TID) | ORAL | 0 refills | Status: DC | PRN

## 2020-07-18 NOTE — Op Note (Signed)
  Patient Name: [name] DOB: Dec 29, 1960  MRN: [id number]   Date of Surgery: 06/15/2020  Surgeon: Dr. Hardie Pulley, DPM Assistants: Dr. Lanae Crumbly  Pre-operative Diagnosis:  Neuroma left leg Post-operative Diagnosis:  same Procedures:  1) Excision of Neuroma  2) Transition of nerve into muscle. 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   Procedure in Detail: Patient was identified in pre-operative holding area. Formal consent was signed and the left lower extremity was marked. Patient was brought back to the operating room. Anesthesia was induced. He was placed into the prone position. The extremity was prepped and draped in the usual sterile fashion. Timeout was taken to confirm patient name, laterality, and procedure prior to incision.   Attention was then directed to the left leg.  There was an area of palpable scar tissue overlying the incision.  Incision was reopened and dissection was carried down to level of the scar tissue.  The scar tissue appeared to be inflamed nerve with the previous nerve wrap.  The nerve was bluntly freed from the surrounding scar tissue and the nerve wrap was sharply excised.  Attention was then directed to the nerve. The nerve appeared to be degenerated at both the proximal and distal aspects and the neuroma measured approximately 2 cm in length.  Given the appearance of his determined that simple decompression of the nerve would not be advisable.  A hemostat was placed on both sides of  the nerve and the irritated nerve tissue was excised.  Primary repair post excision of the neuroma was not feasible and each stump of the nerve was transposed into the muscle belly and sutured with absorbable suture.  The incision was then copiously irrigated.  The wound was then closed in layers with 3-0 Vicryl, 4-0 nylon and skin staples. The foot was then dressed with xeroform, 4x4, kerlix. ACE. Patient tolerated the procedure well.  Dr. Sherryle Lis was scrubbed and present for the entire procedure, and acted as a Physicist, medical. He assisted in retraction, tissue handling, and suturing.  Disposition: Following a period of post-operative monitoring, patient will be transferred home.

## 2020-07-19 ENCOUNTER — Telehealth: Payer: Self-pay | Admitting: *Deleted

## 2020-07-19 ENCOUNTER — Telehealth: Payer: Self-pay | Admitting: Podiatry

## 2020-07-19 MED ORDER — OXYCODONE HCL 15 MG PO TABS: 15.0000 mg | ORAL_TABLET | Freq: Three times a day (TID) | ORAL | 0 refills | Status: DC | PRN

## 2020-07-19 NOTE — Telephone Encounter (Signed)
Patient is calling to request a refill on pain medicine(oxycodone-15mg ,only 2 pills remaining).Please advise.

## 2020-07-19 NOTE — Addendum Note (Signed)
Addended by: Hardie Pulley on: 07/19/2020 02:04 PM   Modules accepted: Orders

## 2020-07-19 NOTE — Telephone Encounter (Signed)
Patient calling again to request a refill on pain medication.

## 2020-07-28 NOTE — Progress Notes (Signed)
  Subjective:  Patient ID: [name], male    DOB: [birth year],  MRN: [id number]  Chief Complaint  Patient presents with  . Routine Post Op    Post-Op (No XRs).POV #2 DOS 06/15/2020 LEFT POSTERIOR LEG DECOMPRESSION OF NERVE    DOS: 06/15/20 Procedure: Decompression/excision of neuroma left leg  60 y.o. male presents with the above complaint. History confirmed with patient.  Objective:  Physical Exam: tenderness at the surgical site, local edema noted and calf supple, nontender. Incision: healing well, no significant drainage, no dehiscence, no significant erythema  Assessment:   1. Sural neuritis, left   2. Neuralgia   3. Pain in left leg     Plan:  Patient was evaluated and treated and all questions answered.  Post-operative State -Sutures removed -Staples removed -Ok to start showering at this time. Advised they cannot soak.  -Nerve symptoms will take a long time to resolve however he is already having increased sensation so I am hopeful about his progress.  He only has local soreness to the lateral sural nerve distribution but restore sensation to the toes.  Return in about 4 weeks (around 08/02/2020) for Post-Op (No XRs).

## 2020-08-02 ENCOUNTER — Other Ambulatory Visit: Payer: Self-pay

## 2020-08-02 ENCOUNTER — Ambulatory Visit (INDEPENDENT_AMBULATORY_CARE_PROVIDER_SITE_OTHER): Payer: Medicare Other | Admitting: Podiatry

## 2020-08-02 DIAGNOSIS — M79605 Pain in left leg: Secondary | ICD-10-CM

## 2020-08-02 DIAGNOSIS — M792 Neuralgia and neuritis, unspecified: Secondary | ICD-10-CM

## 2020-08-02 DIAGNOSIS — G5782 Other specified mononeuropathies of left lower limb: Secondary | ICD-10-CM

## 2020-08-02 MED ORDER — OXYCODONE HCL 15 MG PO TABS: 15.0000 mg | ORAL_TABLET | Freq: Three times a day (TID) | ORAL | 0 refills | Status: DC | PRN

## 2020-08-02 MED ORDER — PREGABALIN 50 MG PO CAPS: 50.0000 mg | ORAL_CAPSULE | Freq: Two times a day (BID) | ORAL | 0 refills | Status: DC

## 2020-08-02 NOTE — Progress Notes (Signed)
  Subjective:  Patient ID: [name], male    DOB: 1960-04-01,  MRN: [id number]  Chief Complaint  Patient presents with  . Routine Post Op    POV #3 DOS 06/15/2020 LEFT POSTERIOR LEG DECOMPRESSION OF NERVE. Pt complains of burning sensations at the left side of his left foot. Pt also complains of soreness.    DOS: 06/15/20 Procedure: Decompression/excision of neuroma left leg  60 y.o. male presents with the above complaint. History confirmed with patient.  Objective:  Physical Exam: no tenderness but absent sensation lateral leg sural distribution. Incision: healed  Assessment:   1. Sural neuritis, left   2. Neuralgia   3. Pain in left leg     Plan:  Patient was evaluated and treated and all questions answered.  Post-operative State - I think the nerve symptoms will improve with time. He does state it is better than it was before surgery. Encouraged use of lyrica along with pain medication. -F/u in 1 month for recheck.  Return in about 1 month (around 09/02/2020) for Post-Op (No XRs).

## 2020-08-12 DIAGNOSIS — N529 Male erectile dysfunction, unspecified: Secondary | ICD-10-CM | POA: Insufficient documentation

## 2020-08-12 DIAGNOSIS — R7309 Other abnormal glucose: Secondary | ICD-10-CM | POA: Insufficient documentation

## 2020-08-15 ENCOUNTER — Telehealth: Payer: Self-pay | Admitting: *Deleted

## 2020-08-15 MED ORDER — PREGABALIN 50 MG PO CAPS: 50.0000 mg | ORAL_CAPSULE | Freq: Two times a day (BID) | ORAL | 0 refills | Status: DC

## 2020-08-15 MED ORDER — OXYCODONE HCL 15 MG PO TABS: 15.0000 mg | ORAL_TABLET | Freq: Three times a day (TID) | ORAL | 0 refills | Status: DC | PRN

## 2020-08-15 NOTE — Telephone Encounter (Signed)
Returned call to patient and informed that his prescription has been sent to pharmacy on file,verbalized understanding.

## 2020-08-15 NOTE — Telephone Encounter (Signed)
Patient is calling to request a pain medicine refill. Please advise. °

## 2020-08-29 ENCOUNTER — Telehealth: Payer: Self-pay | Admitting: *Deleted

## 2020-08-29 ENCOUNTER — Emergency Department (HOSPITAL_COMMUNITY): Payer: Medicare Other

## 2020-08-29 ENCOUNTER — Other Ambulatory Visit: Payer: Self-pay

## 2020-08-29 ENCOUNTER — Encounter (HOSPITAL_COMMUNITY): Payer: Self-pay

## 2020-08-29 ENCOUNTER — Inpatient Hospital Stay (HOSPITAL_COMMUNITY)
Admission: EM | Admit: 2020-08-29 | Discharge: 2020-08-31 | DRG: 190 | Disposition: A | Discharge status: Home / Self Care | Payer: Medicare Other | Attending: Internal Medicine | Admitting: Internal Medicine

## 2020-08-29 DIAGNOSIS — G8929 Other chronic pain: Secondary | ICD-10-CM | POA: Diagnosis present

## 2020-08-29 DIAGNOSIS — Z8249 Family history of ischemic heart disease and other diseases of the circulatory system: Secondary | ICD-10-CM

## 2020-08-29 DIAGNOSIS — R0602 Shortness of breath: Secondary | ICD-10-CM | POA: Diagnosis not present

## 2020-08-29 DIAGNOSIS — Z716 Tobacco abuse counseling: Secondary | ICD-10-CM

## 2020-08-29 DIAGNOSIS — Z7951 Long term (current) use of inhaled steroids: Secondary | ICD-10-CM

## 2020-08-29 DIAGNOSIS — R079 Chest pain, unspecified: Secondary | ICD-10-CM | POA: Diagnosis present

## 2020-08-29 DIAGNOSIS — Z79899 Other long term (current) drug therapy: Secondary | ICD-10-CM | POA: Diagnosis not present

## 2020-08-29 DIAGNOSIS — R0600 Dyspnea, unspecified: Secondary | ICD-10-CM

## 2020-08-29 DIAGNOSIS — J441 Chronic obstructive pulmonary disease with (acute) exacerbation: Secondary | ICD-10-CM | POA: Diagnosis present

## 2020-08-29 DIAGNOSIS — I10 Essential (primary) hypertension: Secondary | ICD-10-CM | POA: Diagnosis present

## 2020-08-29 DIAGNOSIS — R7989 Other specified abnormal findings of blood chemistry: Secondary | ICD-10-CM | POA: Diagnosis not present

## 2020-08-29 DIAGNOSIS — I428 Other cardiomyopathies: Secondary | ICD-10-CM | POA: Diagnosis present

## 2020-08-29 DIAGNOSIS — Z20822 Contact with and (suspected) exposure to covid-19: Secondary | ICD-10-CM | POA: Diagnosis present

## 2020-08-29 DIAGNOSIS — J9601 Acute respiratory failure with hypoxia: Secondary | ICD-10-CM | POA: Diagnosis present

## 2020-08-29 DIAGNOSIS — F17210 Nicotine dependence, cigarettes, uncomplicated: Secondary | ICD-10-CM | POA: Diagnosis present

## 2020-08-29 LAB — CBC WITH DIFFERENTIAL/PLATELET
Abs Immature Granulocytes: 0.01 10*3/uL (ref 0.00–0.07)
Basophils Absolute: 0 10*3/uL (ref 0.0–0.1)
Basophils Relative: 1 %
Eosinophils Absolute: 0.1 10*3/uL (ref 0.0–0.5)
Eosinophils Relative: 2 %
HCT: 49.4 % (ref 39.0–52.0)
Hemoglobin: 16 g/dL (ref 13.0–17.0)
Immature Granulocytes: 0 %
Lymphocytes Relative: 42 %
Lymphs Abs: 2.7 10*3/uL (ref 0.7–4.0)
MCH: 29.3 pg (ref 26.0–34.0)
MCHC: 32.4 g/dL (ref 30.0–36.0)
MCV: 90.3 fL (ref 80.0–100.0)
Monocytes Absolute: 0.5 10*3/uL (ref 0.1–1.0)
Monocytes Relative: 8 %
Neutro Abs: 3.1 10*3/uL (ref 1.7–7.7)
Neutrophils Relative %: 47 %
Platelets: 257 10*3/uL (ref 150–400)
RBC: 5.47 MIL/uL (ref 4.22–5.81)
RDW: 14 % (ref 11.5–15.5)
WBC: 6.4 10*3/uL (ref 4.0–10.5)
nRBC: 0.3 % — ABNORMAL HIGH (ref 0.0–0.2)

## 2020-08-29 LAB — RESP PANEL BY RT-PCR (FLU A&B, COVID) ARPGX2
Influenza A by PCR: NEGATIVE
Influenza B by PCR: NEGATIVE
SARS Coronavirus 2 by RT PCR: NEGATIVE

## 2020-08-29 LAB — BASIC METABOLIC PANEL
Anion gap: 7 (ref 5–15)
BUN: 7 mg/dL (ref 6–20)
CO2: 24 mmol/L (ref 22–32)
Calcium: 9.4 mg/dL (ref 8.9–10.3)
Chloride: 104 mmol/L (ref 98–111)
Creatinine, Ser: 0.82 mg/dL (ref 0.61–1.24)
GFR, Estimated: >60 mL/min (ref >60)
Glucose, Bld: 78 mg/dL (ref 70–99)
Potassium: 3.8 mmol/L (ref 3.5–5.1)
Sodium: 135 mmol/L (ref 135–145)

## 2020-08-29 LAB — TROPONIN I (HIGH SENSITIVITY)
Troponin I (High Sensitivity): 9 ng/L (ref <18)
Troponin I (High Sensitivity): 6 ng/L (ref <18)

## 2020-08-29 MED ORDER — IPRATROPIUM-ALBUTEROL 0.5-2.5 (3) MG/3ML IN SOLN
3.0000 mL | Freq: Once | RESPIRATORY_TRACT | Status: AC
  Administered 2020-08-29: 3 mL via RESPIRATORY_TRACT
  Filled 2020-08-29: qty 3

## 2020-08-29 MED ORDER — IPRATROPIUM BROMIDE 0.02 % IN SOLN
0.5000 mg | Freq: Once | RESPIRATORY_TRACT | Status: AC
  Administered 2020-08-29: 0.5 mg via RESPIRATORY_TRACT
  Filled 2020-08-29: qty 2.5

## 2020-08-29 MED ORDER — METHYLPREDNISOLONE SODIUM SUCC 125 MG IJ SOLR
125.0000 mg | Freq: Once | INTRAMUSCULAR | Status: AC
  Administered 2020-08-29: 125 mg via INTRAVENOUS
  Filled 2020-08-29: qty 2

## 2020-08-29 MED ORDER — ACETAMINOPHEN 650 MG RE SUPP: 650.0000 mg | Freq: Four times a day (QID) | RECTAL | Status: DC | PRN

## 2020-08-29 MED ORDER — MORPHINE SULFATE (PF) 4 MG/ML IV SOLN
4.0000 mg | Freq: Once | INTRAVENOUS | Status: AC
  Administered 2020-08-29: 4 mg via INTRAVENOUS
  Filled 2020-08-29: qty 1

## 2020-08-29 MED ORDER — SENNOSIDES-DOCUSATE SODIUM 8.6-50 MG PO TABS: 1.0000 | ORAL_TABLET | Freq: Every evening | ORAL | Status: DC | PRN

## 2020-08-29 MED ORDER — NICOTINE 14 MG/24HR TD PT24
14.0000 mg | MEDICATED_PATCH | Freq: Every day | TRANSDERMAL | Status: DC
  Administered 2020-08-30 – 2020-08-31 (×2): 14 mg via TRANSDERMAL
  Filled 2020-08-29 (×2): qty 1

## 2020-08-29 MED ORDER — SODIUM CHLORIDE 0.9 % IV SOLN: INTRAVENOUS | Status: DC

## 2020-08-29 MED ORDER — ONDANSETRON HCL 4 MG/2ML IJ SOLN: 4.0000 mg | Freq: Four times a day (QID) | INTRAMUSCULAR | Status: DC | PRN

## 2020-08-29 MED ORDER — ACETAMINOPHEN 325 MG PO TABS: 650.0000 mg | ORAL_TABLET | Freq: Four times a day (QID) | ORAL | Status: DC | PRN

## 2020-08-29 MED ORDER — DOXYCYCLINE HYCLATE 100 MG PO TABS
100.0000 mg | ORAL_TABLET | Freq: Two times a day (BID) | ORAL | Status: DC
  Administered 2020-08-30 – 2020-08-31 (×4): 100 mg via ORAL
  Filled 2020-08-29 (×4): qty 1

## 2020-08-29 MED ORDER — OXYCODONE HCL 5 MG PO TABS
15.0000 mg | ORAL_TABLET | Freq: Three times a day (TID) | ORAL | Status: DC | PRN
  Administered 2020-08-30 – 2020-08-31 (×4): 15 mg via ORAL
  Filled 2020-08-29 (×4): qty 3

## 2020-08-29 MED ORDER — MOMETASONE FURO-FORMOTEROL FUM 200-5 MCG/ACT IN AERO
2.0000 | INHALATION_SPRAY | Freq: Two times a day (BID) | RESPIRATORY_TRACT | Status: DC
  Administered 2020-08-30 – 2020-08-31 (×2): 2 via RESPIRATORY_TRACT
  Filled 2020-08-29: qty 8.8

## 2020-08-29 MED ORDER — IPRATROPIUM-ALBUTEROL 0.5-2.5 (3) MG/3ML IN SOLN: 3.0000 mL | RESPIRATORY_TRACT | Status: DC | PRN

## 2020-08-29 MED ORDER — AMLODIPINE BESYLATE 10 MG PO TABS
10.0000 mg | ORAL_TABLET | Freq: Every day | ORAL | Status: DC
  Administered 2020-08-30 – 2020-08-31 (×2): 10 mg via ORAL
  Filled 2020-08-29: qty 1
  Filled 2020-08-29: qty 2

## 2020-08-29 MED ORDER — ONDANSETRON HCL 4 MG PO TABS: 4.0000 mg | ORAL_TABLET | Freq: Four times a day (QID) | ORAL | Status: DC | PRN

## 2020-08-29 MED ORDER — ALBUTEROL SULFATE (2.5 MG/3ML) 0.083% IN NEBU
5.0000 mg | INHALATION_SOLUTION | Freq: Once | RESPIRATORY_TRACT | Status: AC
  Administered 2020-08-29: 5 mg via RESPIRATORY_TRACT
  Filled 2020-08-29: qty 6

## 2020-08-29 MED ORDER — OXYCODONE HCL 15 MG PO TABS: 15.0000 mg | ORAL_TABLET | Freq: Three times a day (TID) | ORAL | 0 refills | Status: DC | PRN

## 2020-08-29 MED ORDER — ENOXAPARIN SODIUM 40 MG/0.4ML IJ SOSY
40.0000 mg | PREFILLED_SYRINGE | INTRAMUSCULAR | Status: DC
  Administered 2020-08-30: 40 mg via SUBCUTANEOUS
  Filled 2020-08-29: qty 0.4

## 2020-08-29 MED ORDER — PREGABALIN 50 MG PO CAPS
50.0000 mg | ORAL_CAPSULE | Freq: Two times a day (BID) | ORAL | Status: DC
  Administered 2020-08-30 – 2020-08-31 (×4): 50 mg via ORAL
  Filled 2020-08-29: qty 2
  Filled 2020-08-29 (×2): qty 1
  Filled 2020-08-29: qty 2

## 2020-08-29 NOTE — H&P (Signed)
History and Physical    Erbie Baris IRS:[id number] DOB: [birth year] DOA: 08/29/2020  PCP: [name], MD   Patient coming from: Home  Chief Complaint: SOB for 3 weeks  HPI: [name] is a 60 y.o. male with medical history significant for COPD, HTN, cardiomyopathy who presents for evaluation of SOB.  Reports he has been having shortness of breath for the last 3 weeks that is worsened with exertion.  States over the last 48 hours symptoms have become progressively worse and also occurring at rest.  He has been using his inhalers at home without significant improvement.  He does report having wheezing and feeling very tight.  He has a chronic cough that has prominent in the last few days with a phlegm production that is thicker than normal.  He is also developed a substernal chest pressure like a heavy rock is sitting on his chest.  Chest pressure does not radiate.  He reports he has not had any nausea, vomiting, diarrhea, clammy skin, abdominal pain, urinary frequency or dysuria.  He denies any recent travel or prolonged immobilization.  States he has not had any new exposures to plants, chemicals, fumes, pets.  He has no known COVID-19 exposures. Reports he continues to smoke but is trying to quit.  He currently is smoking a little less than a pack of cigarettes a day.  He denies alcohol or illicit drug use.  ED Course: Mr. Halley has been hemodynamically stable in the emergency room.  BP has been 130-145/80-95 in ER. He has had tight breath sounds but is saturating well on room air.  He is received multiple breathing treatments and dose of Solu-Medrol in the emergency room but continues to have tightness and his breathing is not at baseline level.  2 sets of troponins in the emergency room which were 6 and 9 respectively.  He has normal BMP.  CBC unremarkable.  Chest x-ray is consistent with COPD with hyperinflation but no infiltrate or consolidations noted.  COVID-19 and  influenza A and B swab negative.  Hospitalist service has been asked to admit for further management  Review of Systems:  General: Denies fever, chills, weight loss, night sweats. Denies dizziness. Denies change in appetite HENT: Denies head trauma, headache, denies change in hearing, tinnitus. Denies nasal bleeding or congestion.  Denies sore throat, Denies difficulty swallowing Eyes: Denies blurry vision, pain in eye, drainage. Denies discoloration of eyes. Neck: Denies pain.  Denies swelling.  Denies pain with movement. Cardiovascular: Reports chest pain and pressure. Denies palpitations. Denies edema. Denies orthopnea Respiratory: Reports shortness of breath worsened with exertion. Reports cough. Reports wheezing. Reports sputum production Gastrointestinal: Denies abdominal pain, swelling. Denies nausea, vomiting, diarrhea. Denies melena.  Denies hematemesis. Musculoskeletal: Denies limitation of movement. Denies deformity or swelling. Denies arthralgias or myalgias. Genitourinary: Denies pelvic pain.  Denies urinary frequency or hesitancy.  Denies dysuria.  Skin: Denies rash.  Denies petechiae, purpura, ecchymosis. Neurological:  Denies syncope.  Denies seizure activity. Denies paresthesia. Denies slurred speech, drooping face. Denies visual change. Psychiatric: Denies depression, anxiety. Denies hallucinations.  Past Medical History:  Diagnosis Date  . At risk for sleep apnea    10-14-2019  ---   STOP--BANG SCORE = 5   (screening routed  to pt's pcp in epic)  . Cardiomyopathy ([place]) 2009   per cardiac cath 12-10-2007 ef 35-40%;    last echo 04-27-2015 in ef 55-60%  . COPD (chronic obstructive pulmonary disease) ([place])    previously seen pulmonologist-- dr wert---  GOLD 0-- hx multiple exacerbation's last one 04-23-2019 w/ ARF hypoxia (10-14-2019  per pt followed by pcp;  pt was on symbicort bid and duoneb and rescue inhaler as needed  ,  pt stated has productive smoker's cough but no sob or  difficulty breathing;  last used nebulizer one week ago and not used rescue inhaler in along time)  . DDD (degenerative disc disease), cervical    ALL OVER  . History of nuclear stress test    epic 04-08-2015   low risk w/ small inferior wall scar of mild severity focus of reversible ischemia, ef 61%  . Hypertension    followed by pcp  . Neuroma of left leg    with neuritis  . Plantar fasciitis, left     Past Surgical History:  Procedure Laterality Date  . ACHILLES TENDON REPAIR Left 2015  . BICEPS TENDON REPAIR Right 2018  . CARDIAC CATHETERIZATION  12-10-2007  dr al little   no evidence cad, mild to moderate LVD, ef 35-40%  . GASTROC RECESSION EXTREMITY Left 10/22/2018   Procedure: GASTROC RECESSION EXTREMITY;  Surgeon: [name], DPM;  Location: [facility];  Service: Podiatry;  Laterality: Left;  . HEEL SPUR RESECTION Left 10/22/2018   Procedure: HEEL SPUR RESECTION;  Surgeon: [name], DPM;  Location: [place];  Service: Podiatry;  Laterality: Left;  . HEEL SPUR RESECTION Left 10/21/2019   Procedure: HEEL SPUR RESECTION;  Surgeon: [name], DPM;  Location: [place];  Service: Podiatry;  Laterality: Left;  . HIP SURGERY Left 2005 approx.  Marland Kitchen KNEE SURGERY Bilateral x7   last one 2010  approx.  Marland Kitchen NERVE REPAIR Left 07/01/2019   Procedure: Left leg repair/decompression of sural neuroma;  Surgeon: [name], DPM;  Location[facility];  Service: Podiatry;  Laterality: Left;  PRONE  . NERVE REPAIR Left 06/15/2020   Procedure: Decompression of sural neuroma LEFT CALF TRANSPOSITION INTO MUSCLE ;  Surgeon: [name], DPM;  Location: [place];  Service: Podiatry;  Laterality: Left;  PRONE  . PLANTAR FASCIA RELEASE Left 10/22/2018   Procedure: ENDOSCOPIC PLANTAR FASCIOTOMY;  Surgeon: [name], DPM;  Location: [place];  Service: Podiatry;  Laterality:  Left;  . PLANTAR FASCIA RELEASE Left 10/21/2019   Procedure: ENDOSCOPIC PLANTAR FASCIA RELEASE; EXCISION SKIN LESION;  Surgeon: [name], DPM;  Location: [place];  Service: Podiatry;  Laterality: Left;    Social History  reports that he has been smoking cigarettes. He has a 21.00 pack-year smoking history. He has never used smokeless tobacco. He reports current alcohol use. He reports that he does not use drugs.  No Known Allergies  Family History  Problem Relation Age of Onset  . CAD Father   . Cancer Father   . Bone cancer Mother   . Colon cancer Neg Hx   . Colon polyps Neg Hx   . Esophageal cancer Neg Hx   . Rectal cancer Neg Hx   . Stomach cancer Neg Hx      Prior to Admission medications   Medication Sig Start Date End Date Taking? Authorizing Provider  albuterol (VENTOLIN HFA) 108 (90 Base) MCG/ACT inhaler Inhale 2 puffs into the lungs every 4 (four) hours as needed for wheezing or shortness of breath. 04/25/19  Yes Nita Sells, MD  amLODipine (NORVASC) 5 MG tablet Take 10 mg by mouth daily. 06/13/20  Yes [provider]  ipratropium-albuterol (DUONEB) 0.5-2.5 (3) MG/3ML SOLN Take 3 mLs by nebulization 3 (three) times daily. Patient taking differently: Take 3 mLs by nebulization every 4 (four) hours as needed (shortness of breath). 04/25/19  Yes Nita Sells, MD  oxyCODONE (ROXICODONE) 15 MG immediate release tablet Take 1 tablet (15 mg total) by mouth every 8 (eight) hours as needed. Patient taking differently: Take 15 mg by mouth every 8 (eight) hours as needed for pain. 08/29/20 08/29/21 Yes [name], DPM  pregabalin (LYRICA) 50 MG capsule Take 1 capsule (50 mg total) by mouth 2 (two) times daily. 08/15/20  Yes [name], DPM  sildenafil (VIAGRA) 25 MG tablet Take 25 mg by mouth as needed for erectile dysfunction. 07/18/20  Yes [provider]  cephALEXin (KEFLEX) 500 MG capsule Take 1 capsule (500 mg total)  by mouth 2 (two) times daily. Patient not taking: Reported on 08/29/2020 06/15/20   [name], DPM  methylPREDNISolone (MEDROL) 4 MG tablet Take as directed Patient not taking: No sig reported 01/27/20   Pete Pelt, PA-C  SYMBICORT 160-4.5 MCG/ACT inhaler Inhale 2 puffs into the lungs 2 (two) times daily. Patient not taking: Reported on 08/29/2020 04/25/19   Nita Sells, MD    Physical Exam: Vitals:   08/29/20 1938 08/29/20 2015 08/29/20 2116 08/29/20 2200  BP:  (!) 143/86 139/83 (!) 143/95  Pulse: 63  (!) 56 (!) 57  Resp: (!) 27  15 14   Temp:      TempSrc:      SpO2: 97%  96% 97%    Constitutional: NAD, calm, comfortable Vitals:   08/29/20 1938 08/29/20 2015 08/29/20 2116 08/29/20 2200  BP:  (!) 143/86 139/83 (!) 143/95  Pulse: 63  (!) 56 (!) 57  Resp: (!) 27  15 14   Temp:      TempSrc:      SpO2: 97%  96% 97%   General: WDWN, Alert and oriented x3.  Eyes: EOMI, PERRL, conjunctivae normal.  Sclera nonicteric HENT:  [place]/AT, external ears normal.  Nares patent without epistasis.  Mucous membranes are moist. Neck: Soft, normal range of motion, supple, no masses, Trachea midline Respiratory: Equal but diminished breath sounds bilaterally. Diffuse rales and expiratory wheezing, no crackles. Normal respiratory effort. No accessory muscle use.  Cardiovascular: Regular rate and rhythm, no murmurs / rubs / gallops. No extremity edema. 2+ pedal pulses Abdomen: Soft, no tenderness, nondistended, no rebound or guarding.  No masses palpated. Bowel sounds normoactive Musculoskeletal: FROM. no cyanosis. No joint deformity upper and lower extremities. no contractures. Normal muscle tone. Negative Homan's sign bilaterally Skin: Warm, dry, intact no rashes, lesions, ulcers. No induration Neurologic: CN 2-12 grossly intact. Normal speech. Sensation intact. Strength 5/5 in all extremities.   Psychiatric: Normal judgment and insight.  Normal mood.    Labs on Admission: I have  personally reviewed following labs and imaging studies  CBC: Recent Labs  Lab 08/29/20 1900  WBC 6.4  NEUTROABS 3.1  HGB 16.0  HCT 49.4  MCV 90.3  PLT 132    Basic Metabolic Panel: Recent Labs  Lab 08/29/20 1900  NA 135  K 3.8  CL 104  CO2 24  GLUCOSE 78  BUN 7  CREATININE 0.82  CALCIUM 9.4    GFR: CrCl cannot be calculated (Unknown ideal weight.).  Liver Function Tests: No results for input(s): AST, ALT, ALKPHOS, BILITOT, PROT, ALBUMIN in the last 168 hours.  Urine analysis:    Component Value Date/Time  COLORURINE COLORLESS (A) 11/05/2016 2314   APPEARANCEUR CLEAR 11/05/2016 2314   LABSPEC 1.000 (L) 11/05/2016 2314   PHURINE 7.0 11/05/2016 2314   GLUCOSEU NEGATIVE 11/05/2016 2314   HGBUR NEGATIVE 11/05/2016 2314   BILIRUBINUR NEGATIVE 11/05/2016 [place] 11/05/2016 2314   PROTEINUR NEGATIVE 11/05/2016 2314   UROBILINOGEN 1.0 07/21/2014 1514   NITRITE NEGATIVE 11/05/2016 2314   LEUKOCYTESUR NEGATIVE 11/05/2016 2314    Radiological Exams on Admission: DG Chest 2 View  Result Date: 08/29/2020 CLINICAL DATA:  Dyspnea for 3 weeks, current smoker, COPD EXAM: CHEST - 2 VIEW COMPARISON:  04/23/2019 chest radiograph. FINDINGS: Stable cardiomediastinal silhouette with normal heart size. No pneumothorax. No pleural effusion. Hyperinflated lungs. No pulmonary edema. Chronic mild thickening of the minor fissure. No acute consolidative airspace disease. IMPRESSION: Hyperinflated lungs, compatible with reported COPD. No acute cardiopulmonary disease. Electronically Signed   By: Ilona Sorrel M.D.   On: 08/29/2020 18:15    EKG: Independently reviewed.  EKG shows normal sinus rhythm with no acute ST elevation or depression.  QTc 419  Assessment/Plan Principal Problem:   COPD with acute exacerbation  Mr. Golson is admitted to medical telemetry floor.  Started on Doxycycline for acute exacerbation of COPD requiring hospitalization.  Solumedrol 125  mg every 8 hours for 3 doses. First dose given in ER. Duoneb every 4 hours as needed Dulera 2 puffs twice a day with AeroChamber Supplemental oxygen as needed to keep O2 sat between 92 to 96% With pt having dyspnea and chest pain for past 3 weeks will check D-dimer level. If elevated will need further investigation with CTA chest to rule out PE as etiology of symptoms.   Active Problems:   HTN (hypertension) Continue home dose of Norvasc.  Monitor blood pressure.    Chest pain Initial troponins are negative.  We will cycle troponins overnight.  No EKG changes. If troponins remain negative patient will need further evaluation with stress test which could be done as an outpatient    Cigarette smoker Nicotine patch provided to prevent withdrawals.  Health hazards of continued smoking reviewed with patient.  Recommended smoking cessation to patient.  Smoking cessation education be provided before discharge    DVT prophylaxis: Lovenox for DVT prophylaxis.  Code Status:   Full Code  Family Communication:  Diagnosis and plan discussed with patient.  Patient verbalized understanding agrees with plan.  Further recommendations to follow as clinically indicated Disposition Plan:   Patient is from:  Home  Anticipated DC to:  Home  Anticipated DC date:  Anticipate 2 midnight or more stay  Anticipated DC barriers: No barriers to discharge identified at this time  Admission status:  Inpatient  Yevonne Aline Jerren Flinchbaugh MD [facility]  How to contact the [facility] Attending or Consulting provider [place] or covering provider during after hours [place], for this patient?   1. Check the care team in [facility] and look for a) attending/consulting [facility] provider listed and b) the [facility] team listed 2. Log into [email or web address] and use [place]'s universal password to access. If you do not have the password, please contact the hospital operator. 3. Locate the [facility] provider you are looking for under [facility]  and page to a number that you can be directly reached. 4. If you still have difficulty reaching the provider, please page the [facility] (Director on Call) for the Hospitalists listed on amion for assistance.  08/29/2020, 11:28 PM

## 2020-08-29 NOTE — Telephone Encounter (Signed)
Patient is calling for a refill of his pain medicine(Oxycodone-15mg ).Please advise.

## 2020-08-29 NOTE — ED Provider Notes (Signed)
`  Emergency Medicine Provider Triage Evaluation Note  [name] , a 60 y.o. male  was evaluated in triage.  Pt complains of WEAKNESS, sob.  Review of Systems  Positive: wheezing Negative: fever  Physical Exam  BP (!) 145/87 (BP Location: Left Arm)   Pulse 63   Temp 98.5 F (36.9 C) (Oral)   Resp 16   SpO2 99%  Gen:   Awake, no distress   Resp:  Sob, prolonged exp phase  MSK:   Moves extremities without difficulty  Other:    Medical Decision Making  Medically screening exam initiated at 4:51 PM.  Appropriate orders placed.  [name] was informed that the remainder of the evaluation will be completed by another provider, this initial triage assessment does not replace that evaluation, and the importance of remaining in the ED until their evaluation is complete.  Cough, sob, wheezing.- orders initiated   Margarita Mail, PA-C 08/29/20 1656    Long, Wonda [facility], MD 08/29/20 2013

## 2020-08-29 NOTE — Telephone Encounter (Signed)
Called and informed patient had been sent to pharmacy on file, verbalized understanding.

## 2020-08-29 NOTE — ED Triage Notes (Signed)
Patient complains of SOB/COPD x 3 weeks. Patient states that he is using inhaler with no relief. States his chest feels heavy

## 2020-08-29 NOTE — ED Provider Notes (Signed)
[name] EM[facility]Y DE[facility]MENT Provider Note   CSN: [id number] Arrival date & time: 08/29/20  1612     History No chief complaint on file.   [name] is a 60 y.o. male.  60 year old male with prior medical history as detailed below presents for evaluation.  Patient reports gradually worsening shortness of breath over the last 2 to 3 weeks.  Patient reports onset history of COPD.  He reports increasing shortness of breath over the last 48 hours.  He reports using inhalers at home without improvement.  He describes significant wheezing with associated shortness of breath and associated substernal chest discomfort.  He denies fever.  He reports that his anterior chest discomfort is a pressure.  This is been present for at least the last day as his dyspnea is worsened.  He reports significant dyspnea with exertion.  The history is provided by the patient and medical records.  Shortness of Breath Severity:  Moderate Onset quality:  Gradual Duration:  2 weeks Timing:  Constant Progression:  Worsening Chronicity:  Recurrent Context: activity   Relieved by:  Nothing Worsened by:  Activity Ineffective treatments:  Inhaler Associated symptoms: chest pain   Associated symptoms: no fever        Past Medical History:  Diagnosis Date  . At risk for sleep apnea    10-14-2019  ---   STOP--BANG SCORE = 5   (screening routed  to pt's pcp in epic)  . Cardiomyopathy ([place]) 2009   per cardiac cath 12-10-2007 ef 35-40%;    last echo 04-27-2015 in ef 55-60%  . COPD (chronic obstructive pulmonary disease) ([place])    previously seen pulmonologist-- dr Melvyn Novas--- GOLD 0-- hx multiple exacerbation's last one 04-23-2019 w/ ARF hypoxia (10-14-2019  per pt followed by pcp;  pt was on symbicort bid and duoneb and rescue inhaler as needed  ,  pt stated has productive smoker's cough but no sob or difficulty breathing;  last used nebulizer one week ago and not used rescue inhaler in  along time)  . DDD (degenerative disc disease), cervical    ALL OVER  . History of nuclear stress test    epic 04-08-2015   low risk w/ small inferior wall scar of mild severity focus of reversible ischemia, ef 61%  . Hypertension    followed by pcp  . Neuroma of left leg    with neuritis  . Plantar fasciitis, left     Patient Active Problem List   Diagnosis Date Noted  . Benign skin lesion   . COPD (chronic obstructive pulmonary disease) ([place]) 04/24/2019  . Plantar fasciitis   . Heel spur, left   . Acquired equinus deformity of left foot   . Acute respiratory failure with hypoxia ([place]) 06/10/2018  . COPD with acute exacerbation ([place]) 06/10/2018  . Community acquired bilateral lower lobe pneumonia 06/10/2018  . Achilles tendinitis, right leg 04/24/2018  . Acute respiratory distress 12/20/2017  . History of noncompliance with medical treatment 12/20/2017  . Acute sinusitis, unspecified 03/15/2017  . Rupture of right distal biceps tendon 03/01/2016  . Pain in right lower leg 01/30/2016  . Chest pain 04/26/2015  . Hypokalemia 04/26/2015  . COPD GOLD 0 / active smoker 04/26/2015  . Pain in the chest   . Fall 07/22/2014  . Multiple fractures of ribs of right side 07/22/2014  . Pneumothorax on right 07/22/2014  . Traumatic pneumohemothorax 07/21/2014  . Altered mental state 04/15/2013  . Acute encephalopathy 04/15/2013  . Difficulty  speaking 04/15/2013  . Headache(784.0) 04/15/2013  . Cigarette smoker 04/15/2013  . HTN (hypertension) 04/15/2013    Past Surgical History:  Procedure Laterality Date  . ACHILLES TENDON REPAIR Left 2015  . BICEPS TENDON REPAIR Right 2018  . CARDIAC CATHETERIZATION  12-10-2007  dr al little   no evidence cad, mild to moderate LVD, ef 35-40%  . GASTROC RECESSION EXTREMITY Left 10/22/2018   Procedure: GASTROC RECESSION EXTREMITY;  Surgeon: [name], DPM;  Location: [facility];  Service: Podiatry;  Laterality: Left;  . HEEL  SPUR RESECTION Left 10/22/2018   Procedure: HEEL SPUR RESECTION;  Surgeon: [name], DPM;  Location: [place];  Service: Podiatry;  Laterality: Left;  . HEEL SPUR RESECTION Left 10/21/2019   Procedure: HEEL SPUR RESECTION;  Surgeon: [name], DPM;  Location: [place];  Service: Podiatry;  Laterality: Left;  . HIP SURGERY Left 2005 approx.  Marland Kitchen KNEE SURGERY Bilateral x7   last one 2010  approx.  Marland Kitchen NERVE REPAIR Left 07/01/2019   Procedure: Left leg repair/decompression of sural neuroma;  Surgeon: [name], DPM;  Location: [facility];  Service: Podiatry;  Laterality: Left;  PRONE  . NERVE REPAIR Left 06/15/2020   Procedure: Decompression of sural neuroma LEFT CALF TRANSPOSITION INTO MUSCLE ;  Surgeon: [name], DPM;  Location: [place];  Service: Podiatry;  Laterality: Left;  PRONE  . PLANTAR FASCIA RELEASE Left 10/22/2018   Procedure: ENDOSCOPIC PLANTAR FASCIOTOMY;  Surgeon: [name], DPM;  Location: [place];  Service: Podiatry;  Laterality: Left;  . PLANTAR FASCIA RELEASE Left 10/21/2019   Procedure: ENDOSCOPIC PLANTAR FASCIA RELEASE; EXCISION SKIN LESION;  Surgeon: [name], DPM;  Location: [place];  Service: Podiatry;  Laterality: Left;       Family History  Problem Relation Age of Onset  . CAD Father   . Cancer Father   . Bone cancer Mother   . Colon cancer Neg Hx   . Colon polyps Neg Hx   . Esophageal cancer Neg Hx   . Rectal cancer Neg Hx   . Stomach cancer Neg Hx     Social History   Tobacco Use  . Smoking status: Current Every Day Smoker    Packs/day: 0.75    Years: 28.00    Pack years: 21.00    Types: Cigarettes  . Smokeless tobacco: Never Used  Vaping Use  . Vaping Use: Never used  Substance Use Topics  . Alcohol use: Yes    Comment: occasional  . Drug use: Never    Home Medications Prior to Admission medications    Medication Sig Start Date End Date Taking? Authorizing Provider  albuterol (VENTOLIN HFA) 108 (90 Base) MCG/ACT inhaler Inhale 2 puffs into the lungs every 4 (four) hours as needed for wheezing or shortness of breath. 04/25/19   Nita Sells, MD  cephALEXin (KEFLEX) 500 MG capsule Take 1 capsule (500 mg total) by mouth 2 (two) times daily. 06/15/20   [name], DPM  ipratropium-albuterol (DUONEB) 0.5-2.5 (3) MG/3ML SOLN Take 3 mLs by nebulization 3 (three) times daily. Patient taking differently: Take 3 mLs by nebulization every 4 (four) hours as needed. 04/25/19   Nita Sells, MD  methylPREDNISolone (MEDROL) 4 MG tablet Take as directed Patient not taking: Reported on 06/10/2020 01/27/20   Pete Pelt, PA-C  oxyCODONE (ROXICODONE) 15 MG immediate release tablet Take 1 tablet (  15 mg total) by mouth every 8 (eight) hours as needed. 08/29/20 08/29/21  [name], DPM  pregabalin (LYRICA) 50 MG capsule Take 1 capsule (50 mg total) by mouth 2 (two) times daily. 08/15/20   [name], DPM  sildenafil (REVATIO) 20 MG tablet Take 20-40 mg by mouth as needed (before sexual activity).  10/31/18   [provider]  SYMBICORT 160-4.5 MCG/ACT inhaler Inhale 2 puffs into the lungs 2 (two) times daily. 04/25/19   Nita Sells, MD  triamterene-hydrochlorothiazide (DYAZIDE) 37.5-25 MG capsule Take 1 capsule by mouth daily. 04/22/19   [provider]    Allergies    Patient has no known allergies.  Review of Systems   Review of Systems  Constitutional: Negative for fever.  Respiratory: Positive for shortness of breath.   Cardiovascular: Positive for chest pain.  All other systems reviewed and are negative.   Physical Exam Updated Vital Signs BP 140/82 (BP Location: Left Arm)   Pulse 60   Temp 98.2 F (36.8 C) (Oral)   Resp 17   SpO2 100%   Physical Exam Vitals and nursing note reviewed.  Constitutional:      General: He is not in acute  distress.    Appearance: Normal appearance. He is well-developed.  HENT:     Head: Normocephalic and atraumatic.  Eyes:     Conjunctiva/sclera: Conjunctivae normal.     Pupils: Pupils are equal, round, and reactive to light.  Cardiovascular:     Rate and Rhythm: Normal rate and regular rhythm.     Pulses: Normal pulses.     Heart sounds: Normal heart sounds.  Pulmonary:     Effort: Pulmonary effort is normal. No respiratory distress.     Comments: Diffuse bilateral expiratory wheezes  Abdominal:     General: There is no distension.     Palpations: Abdomen is soft.     Tenderness: There is no abdominal tenderness.  Musculoskeletal:        General: No deformity. Normal range of motion.     Cervical back: Normal range of motion and neck supple.  Skin:    General: Skin is warm and dry.  Neurological:     General: No focal deficit present.     Mental Status: He is alert and oriented to person, place, and time.     ED Results / Procedures / Treatments   Labs (all labs ordered are listed, but only abnormal results are displayed) Labs Reviewed  RESP PANEL BY RT-PCR (FLU A&B, COVID) [id number]  BASIC METABOLIC PANEL  CBC WITH DIFFERENTIAL/PLATELET  TROPONIN I (HIGH SENSITIVITY)    EKG EKG Interpretation  Date/Time:  Monday August 29 2020 16:48:49 EDT Ventricular Rate:  63 PR Interval:  172 QRS Duration: 78 QT Interval:  412 QTC Calculation: 421 R Axis:   95 Text Interpretation: Normal sinus rhythm Rightward axis Pulmonary disease pattern Abnormal ECG Confirmed by Dene Gentry [phone]) on 08/29/2020 6:21:35 PM   Radiology DG Chest 2 View  Result Date: 08/29/2020 CLINICAL DATA:  Dyspnea for 3 weeks, current smoker, COPD EXAM: CHEST - 2 VIEW COMPARISON:  04/23/2019 chest radiograph. FINDINGS: Stable cardiomediastinal silhouette with normal heart size. No pneumothorax. No pleural effusion. Hyperinflated lungs. No pulmonary edema. Chronic mild thickening of the minor fissure. No  acute consolidative airspace disease. IMPRESSION: Hyperinflated lungs, compatible with reported COPD. No acute cardiopulmonary disease. Electronically Signed   By: Ilona Sorrel M.D.   On: 08/29/2020 18:15    Procedures Procedures  Medications Ordered in ED Medications  methylPREDNISolone sodium succinate (SOLU-MEDROL) 125 mg/2 mL injection 125 mg (has no administration in time range)  ipratropium-albuterol (DUONEB) 0.5-2.5 (3) MG/3ML nebulizer solution 3 mL (has no administration in time range)  albuterol (PROVENTIL) (2.5 MG/3ML) 0.083% nebulizer solution 5 mg (5 mg Nebulization Given 08/29/20 1703)  ipratropium (ATROVENT) nebulizer solution 0.5 mg (0.5 mg Nebulization Given 08/29/20 1704)    ED Course  I have reviewed the triage vital signs and the nursing notes.  Pertinent labs & imaging results that were available during my care of the patient were reviewed by me and considered in my medical decision making (see chart for details).    MDM Rules/Calculators/A&P                          MDM  MSE complete  [name] was evaluated in Emergency Department on 08/29/2020 for the symptoms described in the history of present illness. He was evaluated in the context of the global COVID-19 pandemic, which necessitated consideration that the patient might be at risk for infection with the SARS-CoV-2 virus that causes COVID-19. Institutional protocols and algorithms that pertain to the evaluation of patients at risk for COVID-19 are in a state of rapid change based on information released by regulatory bodies including the CDC and federal and state organizations. These policies and algorithms were followed during the patient's care in the ED.  Patient presents with complaint of dyspnea.  Patient's exam and work-up is suggestive of significant COPD exacerbation.  Additionally, patient did complain of chest pain.  Patient's chest pain is not typical for ACS.  EKG is without acute ischemia.   Troponin level is low.  Patient continues to complain of persistent shortness of breath  - we will arrange for admission.  Hospital services were case will evaluate for same.   Final Clinical Impression(s) / ED Diagnoses Final diagnoses:  Dyspnea, unspecified type    Rx / DC Orders ED Discharge Orders    None       Valarie Merino, MD 08/29/20 2245

## 2020-08-29 NOTE — Telephone Encounter (Signed)
Patient came by and wants the prescription [facility] [address]because [facility] [place] [facility].   Called pharmacy and a new script has to be sent,cannot transfer. Please advise.

## 2020-08-30 ENCOUNTER — Inpatient Hospital Stay (HOSPITAL_COMMUNITY): Payer: Medicare Other

## 2020-08-30 ENCOUNTER — Telehealth: Payer: Self-pay | Admitting: *Deleted

## 2020-08-30 DIAGNOSIS — R7989 Other specified abnormal findings of blood chemistry: Secondary | ICD-10-CM

## 2020-08-30 DIAGNOSIS — R0602 Shortness of breath: Secondary | ICD-10-CM

## 2020-08-30 DIAGNOSIS — R079 Chest pain, unspecified: Secondary | ICD-10-CM

## 2020-08-30 LAB — BASIC METABOLIC PANEL
Anion gap: 10 (ref 5–15)
BUN: 10 mg/dL (ref 6–20)
CO2: 21 mmol/L — ABNORMAL LOW (ref 22–32)
Calcium: 9 mg/dL (ref 8.9–10.3)
Chloride: 103 mmol/L (ref 98–111)
Creatinine, Ser: 0.95 mg/dL (ref 0.61–1.24)
GFR, Estimated: >60 mL/min (ref >60)
Glucose, Bld: 205 mg/dL — ABNORMAL HIGH (ref 70–99)
Potassium: 4 mmol/L (ref 3.5–5.1)
Sodium: 134 mmol/L — ABNORMAL LOW (ref 135–145)

## 2020-08-30 LAB — CBC
HCT: 47.6 % (ref 39.0–52.0)
Hemoglobin: 15.4 g/dL (ref 13.0–17.0)
MCH: 29.2 pg (ref 26.0–34.0)
MCHC: 32.4 g/dL (ref 30.0–36.0)
MCV: 90.2 fL (ref 80.0–100.0)
Platelets: 243 10*3/uL (ref 150–400)
RBC: 5.28 MIL/uL (ref 4.22–5.81)
RDW: 14 % (ref 11.5–15.5)
WBC: 7.1 10*3/uL (ref 4.0–10.5)
nRBC: 0 % (ref 0.0–0.2)

## 2020-08-30 LAB — HIV ANTIBODY (ROUTINE TESTING W REFLEX): HIV Screen 4th Generation wRfx: NONREACTIVE

## 2020-08-30 LAB — ECHOCARDIOGRAM COMPLETE
AR max vel: 3.48 cm2
AV Area VTI: 3.45 cm2
AV Area mean vel: 3.48 cm2
AV Mean grad: 7 mmHg
AV Peak grad: 15.4 mmHg
Ao pk vel: 1.96 m/s
Area-P 1/2: 2.39 cm2
S' Lateral: 3.2 cm

## 2020-08-30 LAB — D-DIMER, QUANTITATIVE: D-Dimer, Quant: 2.38 ug/mL-FEU — ABNORMAL HIGH (ref 0.00–0.50)

## 2020-08-30 LAB — BRAIN NATRIURETIC PEPTIDE: B Natriuretic Peptide: 34.9 pg/mL (ref 0.0–100.0)

## 2020-08-30 LAB — TROPONIN I (HIGH SENSITIVITY): Troponin I (High Sensitivity): 4 ng/L (ref <18)

## 2020-08-30 MED ORDER — METHYLPREDNISOLONE SODIUM SUCC 125 MG IJ SOLR
125.0000 mg | Freq: Three times a day (TID) | INTRAMUSCULAR | Status: AC
  Administered 2020-08-30 (×2): 125 mg via INTRAVENOUS
  Filled 2020-08-30 (×2): qty 2

## 2020-08-30 MED ORDER — OXYCODONE HCL 15 MG PO TABS: 15.0000 mg | ORAL_TABLET | Freq: Three times a day (TID) | ORAL | 0 refills | Status: DC | PRN

## 2020-08-30 MED ORDER — BENZONATATE 100 MG PO CAPS
100.0000 mg | ORAL_CAPSULE | Freq: Three times a day (TID) | ORAL | Status: DC | PRN
  Administered 2020-08-31: 100 mg via ORAL
  Filled 2020-08-30: qty 1

## 2020-08-30 MED ORDER — ISOSORBIDE MONONITRATE ER 30 MG PO TB24
30.0000 mg | ORAL_TABLET | Freq: Every day | ORAL | Status: DC
  Administered 2020-08-30 – 2020-08-31 (×2): 30 mg via ORAL
  Filled 2020-08-30 (×2): qty 1

## 2020-08-30 MED ORDER — METHYLPREDNISOLONE SODIUM SUCC 125 MG IJ SOLR
60.0000 mg | Freq: Three times a day (TID) | INTRAMUSCULAR | Status: AC
  Administered 2020-08-30 – 2020-08-31 (×2): 60 mg via INTRAVENOUS
  Filled 2020-08-30 (×2): qty 2

## 2020-08-30 NOTE — ED Notes (Signed)
Pt placed on 2L [place]

## 2020-08-30 NOTE — Progress Notes (Signed)
  Echocardiogram 2D Echocardiogram has been performed.  [name] F 08/30/2020, 5:29 PM

## 2020-08-30 NOTE — Plan of Care (Signed)

## 2020-08-30 NOTE — Progress Notes (Signed)
PROGRESS NOTE                                                                                                                                                                                                             Patient Demographics:    [name], is a 60 y.o. male, DOB - 03/20/1961, JKD:[id number]  Outpatient Primary MD for the patient is Pavelock, Ralene Bathe, MD    LOS - 1  Admit date - 08/29/2020    CC - SOB     Brief Narrative (HPI from H&P) - [name] is a 60 y.o. male with medical history significant for COPD, HTN, cardiomyopathy who presents for evaluation of SOB.  Reports he has been having shortness of breath for the last 3 weeks that is worsened with exertion.  States over the last 48 hours symptoms have become progressively worse and also occurring at rest.  He has been using his inhalers at home without significant improvement.  He does report having wheezing and feeling very tight, also mildly productive cough.  In the ER he was diagnosed with acute hypoxic respiratory failure due to COPD exacerbation.   Subjective:    [name] today has, No headache, No chest pain, No abdominal pain - No Nausea, No new weakness tingling or numbness, mild SOB.   Assessment  & Plan :     1. Acute Hypoxic Resp. Failure due to Acute on chronic COPD exacerbation - he is still smoking and has been counseled to quit, placed on IV steroids along with doxycycline, supportive care with nebulizer treatments, requested to use I-S and flutter valve every hour.  Advance activity and titrate down oxygen.  Outpatient pulmonary follow-up.    Encouraged the patient to sit up in chair in the daytime use I-S and flutter valve for pulmonary toiletry.  Will advance activity and titrate down oxygen as possible.    2.  History of smoking.  Counseled to quit.  3.  Atypical chest pain prior to hospital admission.   Likely due to COPD exacerbation and cough, troponin negative EKG nonacute obtain echocardiogram.  4. HTN - he is on Norvasc which will be continued, will add low-dose Imdur for better control.  5.  Chronic pain and chronic narcotic use.  Continue home regimen and minimize overuse.  6.  Borderline elevated D-dimer.  Check leg ultrasound and echocardiogram, moderate dose Lovenox pharmacy to dose.       Condition - Fair  Family Communication  :  None present  Code Status :  Full  Consults  :  None  PUD Prophylaxis : None   Procedures  :     TTE  Leg [place]      Disposition Plan  :    Status is: Inpatient  Remains inpatient appropriate because:IV treatments appropriate due to intensity of illness or inability to take PO   Dispo: The patient is from: Home              Anticipated d/c is to: Home              Patient currently is not medically stable to d/c.   Difficult to place patient No  DVT Prophylaxis  :    enoxaparin (LOVENOX) injection 40 mg Start: 08/30/20 1000     Lab Results  Component Value Date   PLT 243 08/30/2020    Diet :  Diet Order            Diet Heart Room service appropriate? Yes; Fluid consistency: Thin  Diet effective now                  Inpatient Medications  Scheduled Meds: . amLODipine  10 mg Oral Daily  . doxycycline  100 mg Oral Q12H  . enoxaparin (LOVENOX) injection  40 mg Subcutaneous Q24H  . methylPREDNISolone (SOLU-MEDROL) injection  60 mg Intravenous Q8H  . mometasone-formoterol  2 puff Inhalation BID  . nicotine  14 mg Transdermal Daily  . pregabalin  50 mg Oral BID   Continuous Infusions: . sodium chloride 75 mL/hr at 08/30/20 0828   PRN Meds:.acetaminophen **OR** [DISCONTINUED] acetaminophen, ipratropium-albuterol, [DISCONTINUED] ondansetron **OR** ondansetron (ZOFRAN) IV, oxyCODONE, senna-docusate  Antibiotics  :    Anti-infectives (From admission, onward)   Start     Dose/Rate Route Frequency Ordered Stop    08/29/20 2330  doxycycline (VIBRA-TABS) tablet 100 mg        100 mg Oral Every 12 hours 08/29/20 2327         Time Spent in minutes  30   Lala Lund M.D on 08/30/2020 at 9:35 AM  To page go to [email or web address]   [facility] -  Office  [phone]    See all Orders from today for further details    Objective:   Vitals:   08/30/20 0801 08/30/20 0830 08/30/20 0845 08/30/20 0921  BP:  (!) 159/76 131/80 (!) 146/85  Pulse:  (!) 125 65   Resp:  11 14   Temp: 98 F (36.7 C)     TempSrc: Oral     SpO2:  91% 94%     Wt Readings from Last 3 Encounters:  06/15/20 122.7 kg  10/21/19 (!) 115.2 kg  07/01/19 120 kg    No intake or output data in the 24 hours ending 08/30/20 0935   Physical Exam  Awake Alert, No new F.N deficits, Normal affect [place].AT,PERRAL Supple Neck,No JVD, No cervical lymphadenopathy appriciated.  Symmetrical Chest wall movement, Good air movement bilaterally, +ve wheezing RRR,No Gallops,Rubs or new Murmurs, No Parasternal Heave +ve B.Sounds, Abd Soft, No tenderness, No organomegaly appriciated, No rebound - guarding or rigidity. No Cyanosis, Clubbing or edema, No new Rash or bruise       Data Review:    CBC Recent Labs  Lab 08/29/20 1900 08/30/20 0050  WBC 6.4 7.1  HGB 16.0 15.4  HCT 49.4 47.6  PLT 257 243  MCV 90.3 90.2  MCH 29.3 29.2  MCHC 32.4 32.4  RDW 14.0 14.0  LYMPHSABS 2.7  --   MONOABS 0.5  --   EOSABS 0.1  --   BASOSABS 0.0  --     Recent Labs  Lab 08/29/20 1900 08/30/20 0050 08/30/20 0749  NA 135 134*  --   K 3.8 4.0  --   CL 104 103  --   CO2 24 21*  --   GLUCOSE 78 205*  --   BUN 7 10  --   CREATININE 0.82 0.95  --   CALCIUM 9.4 9.0  --   DDIMER  --  2.38*  --   BNP  --   --  34.9    ------------------------------------------------------------------------------------------------------------------ No results for input(s): CHOL, HDL, LDLCALC, TRIG, CHOLHDL, LDLDIRECT in the last 72 hours.  Lab Results   Component Value Date   HGBA1C 5.6 04/15/2013   ------------------------------------------------------------------------------------------------------------------ No results for input(s): TSH, T4TOTAL, T3FREE, THYROIDAB in the last 72 hours.  Invalid input(s): FREET3  Cardiac Enzymes No results for input(s): CKMB, TROPONINI, MYOGLOBIN in the last 168 hours.  Invalid input(s): CK ------------------------------------------------------------------------------------------------------------------    Component Value Date/Time   BNP 34.9 08/30/2020 0749    Micro Results Recent Results (from the past 240 hour(s))  Resp Panel by RT-PCR (Flu A&B, Covid) Nasopharyngeal Swab     Status: None   Collection Time: 08/29/20  4:57 PM   Specimen: Nasopharyngeal Swab; Nasopharyngeal(NP) swabs in vial transport medium  Result Value Ref Range Status   SARS Coronavirus 2 by RT PCR NEGATIVE NEGATIVE Final    Comment: (NOTE) SARS-CoV-2 target nucleic acids are NOT DETECTED.  The SARS-CoV-2 RNA is generally detectable in upper respiratory specimens during the acute phase of infection. The lowest concentration of SARS-CoV-2 viral copies this assay can detect is 138 copies/mL. A negative result does not preclude SARS-Cov-2 infection and should not be used as the sole basis for treatment or other patient management decisions. A negative result may occur with  improper specimen collection/handling, submission of specimen other than nasopharyngeal swab, presence of viral mutation(s) within the areas targeted by this assay, and inadequate number of viral copies(<138 copies/mL). A negative result must be combined with clinical observations, patient history, and epidemiological information. The expected result is Negative.  Fact Sheet for Patients:  EntrepreneurPulse.com.au  Fact Sheet for Healthcare Providers:  IncredibleEmployment.be  This test is no t yet approved  or cleared by the [place] FDA and  has been authorized for detection and/or diagnosis of SARS-CoV-2 by FDA under an Emergency Use Authorization (EUA). This EUA will remain  in effect (meaning this test can be used) for the duration of the COVID-19 declaration under Section 564(b)(1) of the Act, 21 U.S.C.section 360bbb-3(b)(1), unless the authorization is terminated  or revoked sooner.       Influenza A by PCR NEGATIVE NEGATIVE Final   Influenza B by PCR NEGATIVE NEGATIVE Final    Comment: (NOTE) The Xpert Xpress SARS-CoV-2/FLU/RSV plus assay is intended as an aid in the diagnosis of influenza from Nasopharyngeal swab specimens and should not be used as a sole basis for treatment. Nasal washings and aspirates are unacceptable for Xpert Xpress SARS-CoV-2/FLU/RSV testing.  Fact Sheet for Patients: EntrepreneurPulse.com.au  Fact Sheet for Healthcare Providers: IncredibleEmployment.be  This test is not yet approved or cleared by the [place] FDA and has been authorized for detection and/or diagnosis of  SARS-CoV-2 by FDA under an Emergency Use Authorization (EUA). This EUA will remain in effect (meaning this test can be used) for the duration of the COVID-19 declaration under Section 564(b)(1) of the Act, 21 U.S.C. section 360bbb-3(b)(1), unless the authorization is terminated or revoked.  Performed at [facility], [place] [address] [place], [place] [address]     Radiology Reports DG Chest 2 View  Result Date: 08/29/2020 CLINICAL DATA:  Dyspnea for 3 weeks, current smoker, COPD EXAM: CHEST - 2 VIEW COMPARISON:  04/23/2019 chest radiograph. FINDINGS: Stable cardiomediastinal silhouette with normal heart size. No pneumothorax. No pleural effusion. Hyperinflated lungs. No pulmonary edema. Chronic mild thickening of the minor fissure. No acute consolidative airspace disease. IMPRESSION: Hyperinflated lungs, compatible with reported  COPD. No acute cardiopulmonary disease. Electronically Signed   By: Ilona Sorrel M.D.   On: 08/29/2020 18:15

## 2020-08-30 NOTE — Progress Notes (Signed)
Lower extremity venous has been completed.   Preliminary results in CV Proc.   Abram Sander 08/30/2020 10:17 AM

## 2020-08-30 NOTE — Telephone Encounter (Signed)
Returned call to patient to inform that medication has been resent to new pharmacy.

## 2020-08-30 NOTE — ED Notes (Signed)
Report given to Latanya Presser, RN of [phone]

## 2020-08-31 ENCOUNTER — Inpatient Hospital Stay (HOSPITAL_COMMUNITY): Payer: Medicare Other

## 2020-08-31 LAB — COMPREHENSIVE METABOLIC PANEL
ALT: 16 U/L (ref 0–44)
AST: 14 U/L — ABNORMAL LOW (ref 15–41)
Albumin: 3.4 g/dL — ABNORMAL LOW (ref 3.5–5.0)
Alkaline Phosphatase: 66 U/L (ref 38–126)
Anion gap: 8 (ref 5–15)
BUN: 21 mg/dL — ABNORMAL HIGH (ref 6–20)
CO2: 24 mmol/L (ref 22–32)
Calcium: 9.3 mg/dL (ref 8.9–10.3)
Chloride: 104 mmol/L (ref 98–111)
Creatinine, Ser: 1.03 mg/dL (ref 0.61–1.24)
GFR, Estimated: >60 mL/min (ref >60)
Glucose, Bld: 139 mg/dL — ABNORMAL HIGH (ref 70–99)
Potassium: 4.6 mmol/L (ref 3.5–5.1)
Sodium: 136 mmol/L (ref 135–145)
Total Bilirubin: 0.4 mg/dL (ref 0.3–1.2)
Total Protein: 6.3 g/dL — ABNORMAL LOW (ref 6.5–8.1)

## 2020-08-31 LAB — CBC WITH DIFFERENTIAL/PLATELET
Abs Immature Granulocytes: 0.16 10*3/uL — ABNORMAL HIGH (ref 0.00–0.07)
Basophils Absolute: 0 10*3/uL (ref 0.0–0.1)
Basophils Relative: 0 %
Eosinophils Absolute: 0 10*3/uL (ref 0.0–0.5)
Eosinophils Relative: 0 %
HCT: 46.3 % (ref 39.0–52.0)
Hemoglobin: 14.9 g/dL (ref 13.0–17.0)
Immature Granulocytes: 1 %
Lymphocytes Relative: 6 %
Lymphs Abs: 1.3 10*3/uL (ref 0.7–4.0)
MCH: 29.3 pg (ref 26.0–34.0)
MCHC: 32.2 g/dL (ref 30.0–36.0)
MCV: 91.1 fL (ref 80.0–100.0)
Monocytes Absolute: 0.7 10*3/uL (ref 0.1–1.0)
Monocytes Relative: 3 %
Neutro Abs: 19.1 10*3/uL — ABNORMAL HIGH (ref 1.7–7.7)
Neutrophils Relative %: 90 %
Platelets: 238 10*3/uL (ref 150–400)
RBC: 5.08 MIL/uL (ref 4.22–5.81)
RDW: 14.1 % (ref 11.5–15.5)
WBC: 21.3 10*3/uL — ABNORMAL HIGH (ref 4.0–10.5)
nRBC: 0 % (ref 0.0–0.2)

## 2020-08-31 LAB — MAGNESIUM: Magnesium: 2.2 mg/dL (ref 1.7–2.4)

## 2020-08-31 LAB — BRAIN NATRIURETIC PEPTIDE: B Natriuretic Peptide: 48.5 pg/mL (ref 0.0–100.0)

## 2020-08-31 MED ORDER — PREDNISONE 5 MG PO TABS: ORAL_TABLET | ORAL | 0 refills | Status: DC

## 2020-08-31 MED ORDER — ISOSORBIDE MONONITRATE ER 30 MG PO TB24: 30.0000 mg | ORAL_TABLET | Freq: Every day | ORAL | 0 refills | Status: AC

## 2020-08-31 MED ORDER — NICOTINE 14 MG/24HR TD PT24: 14.0000 mg | MEDICATED_PATCH | Freq: Every day | TRANSDERMAL | 0 refills | Status: AC

## 2020-08-31 MED ORDER — METHYLPREDNISOLONE SODIUM SUCC 125 MG IJ SOLR
60.0000 mg | Freq: Three times a day (TID) | INTRAMUSCULAR | Status: DC
  Filled 2020-08-31: qty 2

## 2020-08-31 MED ORDER — NEBIVOLOL HCL 2.5 MG PO TABS: 2.5000 mg | ORAL_TABLET | Freq: Every day | ORAL | 0 refills | Status: AC

## 2020-08-31 MED ORDER — ASPIRIN 81 MG PO CHEW
81.0000 mg | CHEWABLE_TABLET | Freq: Every day | ORAL | Status: DC
  Administered 2020-08-31: 81 mg via ORAL
  Filled 2020-08-31: qty 1

## 2020-08-31 MED ORDER — NEBIVOLOL HCL 5 MG PO TABS
2.5000 mg | ORAL_TABLET | Freq: Every day | ORAL | Status: DC
  Administered 2020-08-31: 2.5 mg via ORAL
  Filled 2020-08-31: qty 1

## 2020-08-31 MED ORDER — ASPIRIN 81 MG PO CHEW: 81.0000 mg | CHEWABLE_TABLET | Freq: Every day | ORAL | 0 refills | Status: DC

## 2020-08-31 MED ORDER — IOHEXOL 350 MG/ML SOLN
100.0000 mL | Freq: Once | INTRAVENOUS | Status: AC | PRN
  Administered 2020-08-31: 100 mL via INTRAVENOUS

## 2020-08-31 MED ORDER — AMLODIPINE BESYLATE 10 MG PO TABS: 10.0000 mg | ORAL_TABLET | Freq: Every day | ORAL | 0 refills | Status: AC

## 2020-08-31 MED ORDER — LEVOFLOXACIN 500 MG PO TABS: 500.0000 mg | ORAL_TABLET | Freq: Every day | ORAL | 0 refills | Status: AC

## 2020-08-31 NOTE — Progress Notes (Signed)
Patient made aware of rationale for tele box placement.  Patient refused. Physician aware.

## 2020-08-31 NOTE — Discharge Summary (Signed)
[name] YIR:[id number] DOB: March 16, 1961 DOA: 08/29/2020  PCP: [name], MD  Admit date: 08/29/2020  Discharge date: 08/31/2020  Admitted From: Home   Disposition:  Home   Recommendations for Outpatient Follow-up:   Follow up with PCP in 1-2 weeks  PCP Please obtain BMP/CBC, 2 view CXR in 1week,  (see Discharge instructions)   PCP Please follow up on the following pending results:  Needs outpt Stress test   Home Health: None  Equipment/Devices: None  Consultations: None  Discharge Condition: Stable    CODE STATUS: Full    Diet Recommendation: Heart Healthy   Diet Order            Diet - low sodium heart healthy           Diet Heart Room service appropriate? Yes; Fluid consistency: Thin  Diet effective now                  No chief complaint on file.    Brief history of present illness from the day of admission and additional interim summary    Victor Crutchfieldis a 60 y.o.malewith medical history significant forCOPD, HTN, cardiomyopathy who presents for evaluation of SOB.Reports he has been having shortness of breath for the last 3 weeks that is worsened with exertion. States over the last 48 hours symptoms have become progressively worse and also occurring at rest. He has been using his inhalers at home without significant improvement. He does report having wheezing and feeling very tight, also mildly productive cough.  In the ER he was diagnosed with acute hypoxic respiratory failure due to COPD exacerbation.                                                                 Hospital Course     Acute Hypoxic Resp. Failure due to Acute on chronic COPD exacerbation - he is still smoking and has been counseled to quit, he was placed on IV steroids and antibiotics for atypical  coverage with good results, he is symptom-free on room air now, eager to go home will be discharged home on a few more days of oral Levaquin and oral steroid taper, counseled to quit smoking, follow with PCP in 7 to 10 days.    2.  History of smoking.  Counseled to quit.  Provided NicoDerm patch.  3.  Atypical chest pain prior to hospital admission.  Likely due to COPD exacerbation and cough, troponin negative EKG stable echocardiogram with preserved EF and no wall motion abnormality.  4. HTN - he is on Norvasc which will be continued, will add low-dose Imdur for better control.  5.  Chronic pain and chronic narcotic use.  Continue home regimen and minimize overuse.  6.  Borderline elevated D-dimer.    Negative renal ultrasound,  negative CTA, echo stable.   Discharge diagnosis     Principal Problem:   COPD with acute exacerbation ([place]) Active Problems:   Cigarette smoker   HTN (hypertension)   Chest pain    Discharge instructions    Discharge Instructions    Diet - low sodium heart healthy   Complete by: As directed    Discharge instructions   Complete by: As directed    Follow with Primary MD Pavelock, Ralene Bathe, MD in 7 days   Get CBC, CMP, 2 view Chest X ray -  checked next visit within 1 week by Primary MD    Activity: As tolerated with Full fall precautions use walker/cane & assistance as needed  Disposition Home    Diet: Heart Healthy    Special Instructions: If you have smoked or chewed Tobacco  in the last 2 yrs please stop smoking, stop any regular Alcohol  and or any Recreational drug use.  On your next visit with your primary care physician please Get Medicines reviewed and adjusted.  Please request your Prim.MD to go over all Hospital Tests and Procedure/Radiological results at the follow up, please get all Hospital records sent to your Prim MD by signing hospital release before you go home.  If you experience worsening of your admission symptoms,  develop shortness of breath, life threatening emergency, suicidal or homicidal thoughts you must seek medical attention immediately by calling 911 or calling your MD immediately  if symptoms less severe.  You Must read complete instructions/literature along with all the possible adverse reactions/side effects for all the Medicines you take and that have been prescribed to you. Take any new Medicines after you have completely understood and accpet all the possible adverse reactions/side effects.   Increase activity slowly   Complete by: As directed       Discharge Medications   Allergies as of 08/31/2020   No Known Allergies     Medication List    STOP taking these medications   cephALEXin 500 MG capsule Commonly known as: KEFLEX   methylPREDNISolone 4 MG tablet Commonly known as: Medrol   sildenafil 25 MG tablet Commonly known as: VIAGRA   Symbicort 160-4.5 MCG/ACT inhaler Generic drug: budesonide-formoterol     TAKE these medications   albuterol 108 (90 Base) MCG/ACT inhaler Commonly known as: VENTOLIN HFA Inhale 2 puffs into the lungs every 4 (four) hours as needed for wheezing or shortness of breath.   amLODipine 10 MG tablet Commonly known as: NORVASC Take 1 tablet (10 mg total) by mouth daily. What changed: medication strength   aspirin 81 MG chewable tablet Chew 1 tablet (81 mg total) by mouth daily.   ipratropium-albuterol 0.5-2.5 (3) MG/3ML Soln Commonly known as: DUONEB Take 3 mLs by nebulization 3 (three) times daily. What changed:   when to take this  reasons to take this   isosorbide mononitrate 30 MG 24 hr tablet Commonly known as: IMDUR Take 1 tablet (30 mg total) by mouth daily.   levofloxacin 500 MG tablet Commonly known as: Levaquin Take 1 tablet (500 mg total) by mouth daily for 10 days.   nebivolol 2.5 MG tablet Commonly known as: BYSTOLIC Take 1 tablet (2.5 mg total) by mouth daily.   nicotine 14 mg/24hr patch Commonly known as:  NICODERM CQ - dosed in mg/24 hours Place 1 patch (14 mg total) onto the skin daily.   oxyCODONE 15 MG immediate release tablet Commonly known as: Roxicodone Take 1 tablet (15 mg total) by  mouth every 8 (eight) hours as needed. What changed: reasons to take this   predniSONE 5 MG tablet Commonly known as: DELTASONE Label  & dispense according to the schedule below. take 8 Pills PO for 3 days, 6 Pills PO for 3 days, 4 Pills PO for 3 days, 2 Pills PO for 3 days, 1 Pills PO for 3 days, 1/2 Pill  PO for 3 days then STOP. Total 65 pills.   pregabalin 50 MG capsule Commonly known as: Lyrica Take 1 capsule (50 mg total) by mouth 2 (two) times daily.        Follow-up Information    Pavelock, Ralene Bathe, MD. Schedule an appointment as soon as possible for a visit in 1 week(s).   Specialty: Internal Medicine Contact information: [address] [place] [address] [phone]        Adrian Prows, MD. Schedule an appointment as soon as possible for a visit in 1 week(s).   Specialty: Cardiology Contact information: [place] [address] [phone]               Major procedures and Radiology Reports - PLEASE review detailed and final reports thoroughly  -       DG Chest 2 View  Result Date: 08/29/2020 CLINICAL DATA:  Dyspnea for 3 weeks, current smoker, COPD EXAM: CHEST - 2 VIEW COMPARISON:  04/23/2019 chest radiograph. FINDINGS: Stable cardiomediastinal silhouette with normal heart size. No pneumothorax. No pleural effusion. Hyperinflated lungs. No pulmonary edema. Chronic mild thickening of the minor fissure. No acute consolidative airspace disease. IMPRESSION: Hyperinflated lungs, compatible with reported COPD. No acute cardiopulmonary disease. Electronically Signed   By: Ilona Sorrel M.D.   On: 08/29/2020 18:15   CT Angio Chest Pulmonary Embolism (PE) W or WO Contrast  Result Date: 08/31/2020 CLINICAL DATA:  Shortness of breath EXAM: CT  ANGIOGRAPHY CHEST WITH CONTRAST TECHNIQUE: Multidetector CT imaging of the chest was performed using the standard protocol during bolus administration of intravenous contrast. Multiplanar CT image reconstructions and MIPs were obtained to evaluate the vascular anatomy. CONTRAST:  162mL OMNIPAQUE IOHEXOL 350 MG/ML SOLN COMPARISON:  06/10/2018 FINDINGS: Cardiovascular: Satisfactory opacification of the pulmonary arteries to the segmental level. No evidence of pulmonary embolism. Normal heart size. No pericardial effusion. Mediastinum/Nodes: Negative for adenopathy or mass Lungs/Pleura: Lungs are clear. No pleural effusion or pneumothorax. Paraseptal emphysema at the apices. Linear scarring in the right mid to lower lung. Upper Abdomen: Partially covered right renal cyst. Musculoskeletal: Lower thoracic spondylosis Review of the MIP images confirms the above findings. IMPRESSION: No evidence of pulmonary embolism or other acute finding Electronically Signed   By: Monte Fantasia M.D.   On: 08/31/2020 10:52   DG Chest Port 1 View  Result Date: 08/31/2020 CLINICAL DATA:  Shortness of breath EXAM: PORTABLE CHEST 1 VIEW COMPARISON:  Two days ago FINDINGS: Normal heart size and mediastinal contours. Linear scarring on the right. No acute infiltrate or edema. No effusion or pneumothorax. No acute osseous findings. IMPRESSION: No active disease. Electronically Signed   By: Monte Fantasia M.D.   On: 08/31/2020 06:30   ECHOCARDIOGRAM COMPLETE  Result Date: 08/30/2020    ECHOCARDIOGRAM REPORT   Patient Name:   [facility] [place] Date of Exam: 08/30/2020 Medical Rec #:  [id number]          Height:       74.0 in Accession #:    [id number]  Weight:       270.6 lb Date of Birth:  12-23-1960         BSA:          2.471 m Patient Age:    59 years           BP:           135/90 mmHg Patient Gender: M                  HR:           67 bpm. Exam Location:  Inpatient Procedure: 2D Echo, Cardiac Doppler and Color Doppler  Indications:    I50.9* Heart failure (unspecified)  History:        Patient has prior history of Echocardiogram examinations, most                 recent 04/27/2015.  Sonographer:    [name] RDCS Referring Phys: [id number] [place]  1. Left ventricular ejection fraction, by estimation, is 60 to 65%. The left ventricle has normal function. The left ventricle has no regional wall motion abnormalities. Left ventricular diastolic parameters were normal.  2. Right ventricular systolic function is normal. The right ventricular size is normal.  3. The mitral valve is normal in structure. No evidence of mitral valve regurgitation. No evidence of mitral stenosis.  4. The aortic valve is normal in structure. Aortic valve regurgitation is not visualized. No aortic stenosis is present.  5. The inferior vena cava is normal in size with greater than 50% respiratory variability, suggesting right atrial pressure of 3 mmHg. Comparison(s): Prior images unable to be directly viewed, comparison made by report only. FINDINGS  Left Ventricle: Left ventricular ejection fraction, by estimation, is 60 to 65%. The left ventricle has normal function. The left ventricle has no regional wall motion abnormalities. The left ventricular internal cavity size was normal in size. There is  no left ventricular hypertrophy. Left ventricular diastolic parameters were normal. Normal left ventricular filling pressure. Right Ventricle: The right ventricular size is normal. No increase in right ventricular wall thickness. Right ventricular systolic function is normal. Left Atrium: Left atrial size was normal in size. Right Atrium: Right atrial size was normal in size. Pericardium: There is no evidence of pericardial effusion. Mitral Valve: The mitral valve is normal in structure. No evidence of mitral valve regurgitation. No evidence of mitral valve stenosis. Tricuspid Valve: The tricuspid valve is normal in structure. Tricuspid valve  regurgitation is not demonstrated. No evidence of tricuspid stenosis. Aortic Valve: The aortic valve is normal in structure. Aortic valve regurgitation is not visualized. No aortic stenosis is present. Aortic valve mean gradient measures 7.0 mmHg. Aortic valve peak gradient measures 15.4 mmHg. Aortic valve area, by VTI measures 3.45 cm. Pulmonic Valve: The pulmonic valve was normal in structure. Pulmonic valve regurgitation is not visualized. No evidence of pulmonic stenosis. Aorta: The aortic root is normal in size and structure. Venous: The inferior vena cava is normal in size with greater than 50% respiratory variability, suggesting right atrial pressure of 3 mmHg. IAS/Shunts: No atrial level shunt detected by color flow Doppler.  LEFT VENTRICLE PLAX 2D LVIDd:         4.40 cm  Diastology LVIDs:         3.20 cm  LV e' medial:    7.83 cm/s LV PW:         1.10 cm  LV E/e' medial:  8.6 LV IVS:  1.10 cm  LV e' lateral:   10.90 cm/s LVOT diam:     2.30 cm  LV E/e' lateral: 6.2 LV SV:         120 LV SV Index:   49 LVOT Area:     4.15 cm  RIGHT VENTRICLE RV Basal diam:  4.10 cm RV Mid diam:    3.30 cm LEFT ATRIUM             Index       RIGHT ATRIUM           Index LA diam:        3.60 cm 1.46 cm/m  RA Area:     21.20 cm LA Vol (A2C):   58.6 ml 23.71 ml/m RA Volume:   64.50 ml  26.10 ml/m LA Vol (A4C):   58.3 ml 23.59 ml/m LA Biplane Vol: 71.9 ml 29.09 ml/m  AORTIC VALVE AV Area (Vmax):    3.48 cm AV Area (Vmean):   3.48 cm AV Area (VTI):     3.45 cm AV Vmax:           196.00 cm/s AV Vmean:          123.000 cm/s AV VTI:            0.348 m AV Peak Grad:      15.4 mmHg AV Mean Grad:      7.0 mmHg LVOT Vmax:         164.00 cm/s LVOT Vmean:        103.000 cm/s LVOT VTI:          0.289 m LVOT/AV VTI ratio: 0.83  AORTA Ao Root diam: 3.60 cm MITRAL VALVE MV Area (PHT): 2.39 cm    SHUNTS MV Decel Time: 318 msec    Systemic VTI:  0.29 m MV E velocity: 67.70 cm/s  Systemic Diam: 2.30 cm MV A velocity: 86.50 cm/s  MV E/A ratio:  0.78 Mihai Croitoru MD Electronically signed by Sanda Klein MD Signature Date/Time: 08/30/2020/5:44:49 PM    Final    VAS [place] LOWER EXTREMITY VENOUS (DVT)  Result Date: 08/30/2020  Lower Venous DVT Study Patient Name:  [facility]  Date of Exam:   08/30/2020 Medical Rec #: [id number]           Accession #:    [id number] Date of Birth: Feb 23, 1961          Patient Gender: M Patient Age:   59Y Exam Location:  [facility] Procedure:      VAS [place] LOWER EXTREMITY VENOUS (DVT) Referring Phys: [id number] [facility] --------------------------------------------------------------------------------  Indications: Elevated ddimer.  Comparison Study: 09/13/17 prior Performing Technologist: Archie Patten RVS  Examination Guidelines: A complete evaluation includes B-mode imaging, spectral Doppler, color Doppler, and power Doppler as needed of all accessible portions of each vessel. Bilateral testing is considered an integral part of a complete examination. Limited examinations for reoccurring indications may be performed as noted. The reflux portion of the exam is performed with the patient in reverse Trendelenburg.  +---------+---------------+---------+-----------+----------+--------------+ RIGHT    CompressibilityPhasicitySpontaneityPropertiesThrombus Aging +---------+---------------+---------+-----------+----------+--------------+ CFV      Full           Yes      Yes                                 +---------+---------------+---------+-----------+----------+--------------+ SFJ      Full                                                        +---------+---------------+---------+-----------+----------+--------------+  FV Prox  Full                                                        +---------+---------------+---------+-----------+----------+--------------+ FV Mid   Full                                                         +---------+---------------+---------+-----------+----------+--------------+ FV DistalFull                                                        +---------+---------------+---------+-----------+----------+--------------+ PFV      Full                                                        +---------+---------------+---------+-----------+----------+--------------+ POP      Full           Yes      Yes                                 +---------+---------------+---------+-----------+----------+--------------+ PTV      Full                                                        +---------+---------------+---------+-----------+----------+--------------+ PERO     Full                                                        +---------+---------------+---------+-----------+----------+--------------+   +---------+---------------+---------+-----------+----------+--------------+ LEFT     CompressibilityPhasicitySpontaneityPropertiesThrombus Aging +---------+---------------+---------+-----------+----------+--------------+ CFV      Full           Yes      Yes                                 +---------+---------------+---------+-----------+----------+--------------+ SFJ      Full                                                        +---------+---------------+---------+-----------+----------+--------------+ FV Prox  Full                                                        +---------+---------------+---------+-----------+----------+--------------+  FV Mid   Full                                                        +---------+---------------+---------+-----------+----------+--------------+ FV DistalFull                                                        +---------+---------------+---------+-----------+----------+--------------+ PFV      Full                                                         +---------+---------------+---------+-----------+----------+--------------+ POP      Full           Yes      Yes                                 +---------+---------------+---------+-----------+----------+--------------+ PTV      Full                                                        +---------+---------------+---------+-----------+----------+--------------+ PERO     Full                                                        +---------+---------------+---------+-----------+----------+--------------+     Summary: BILATERAL: - No evidence of deep vein thrombosis seen in the lower extremities, bilaterally. -No evidence of popliteal cyst, bilaterally.   *See table(s) above for measurements and observations. Electronically signed by Ruta Hinds MD on 08/30/2020 at 4:26:18 PM.    Final     Micro Results     Recent Results (from the past 240 hour(s))  Resp Panel by RT-PCR (Flu A&B, Covid) Nasopharyngeal Swab     Status: None   Collection Time: 08/29/20  4:57 PM   Specimen: Nasopharyngeal Swab; Nasopharyngeal(NP) swabs in vial transport medium  Result Value Ref Range Status   SARS Coronavirus 2 by RT PCR NEGATIVE NEGATIVE Final    Comment: (NOTE) SARS-CoV-2 target nucleic acids are NOT DETECTED.  The SARS-CoV-2 RNA is generally detectable in upper respiratory specimens during the acute phase of infection. The lowest concentration of SARS-CoV-2 viral copies this assay can detect is 138 copies/mL. A negative result does not preclude SARS-Cov-2 infection and should not be used as the sole basis for treatment or other patient management decisions. A negative result may occur with  improper specimen collection/handling, submission of specimen other than nasopharyngeal swab, presence of viral mutation(s) within the areas targeted by this assay, and inadequate number of viral copies(<138 copies/mL). A negative result must be combined with clinical observations, patient history,  and epidemiological information.  The expected result is Negative.  Fact Sheet for Patients:  EntrepreneurPulse.com.au  Fact Sheet for Healthcare Providers:  IncredibleEmployment.be  This test is no t yet approved or cleared by the [place] FDA and  has been authorized for detection and/or diagnosis of SARS-CoV-2 by FDA under an Emergency Use Authorization (EUA). This EUA will remain  in effect (meaning this test can be used) for the duration of the COVID-19 declaration under Section 564(b)(1) of the Act, 21 U.S.C.section 360bbb-3(b)(1), unless the authorization is terminated  or revoked sooner.       Influenza A by PCR NEGATIVE NEGATIVE Final   Influenza B by PCR NEGATIVE NEGATIVE Final    Comment: (NOTE) The Xpert Xpress SARS-CoV-2/FLU/RSV plus assay is intended as an aid in the diagnosis of influenza from Nasopharyngeal swab specimens and should not be used as a sole basis for treatment. Nasal washings and aspirates are unacceptable for Xpert Xpress SARS-CoV-2/FLU/RSV testing.  Fact Sheet for Patients: EntrepreneurPulse.com.au  Fact Sheet for Healthcare Providers: IncredibleEmployment.be  This test is not yet approved or cleared by the [place] FDA and has been authorized for detection and/or diagnosis of SARS-CoV-2 by FDA under an Emergency Use Authorization (EUA). This EUA will remain in effect (meaning this test can be used) for the duration of the COVID-19 declaration under Section 564(b)(1) of the Act, 21 U.S.C. section 360bbb-3(b)(1), unless the authorization is terminated or revoked.  Performed at [facility], [place] [address], [place], [place] [address]     Today   Subjective    [name] today has no headache,no chest abdominal pain,no new weakness tingling or numbness, feels much better wants to go home today.    Objective   Blood pressure 140/80, pulse 70,  temperature 98.3 F (36.8 C), temperature source Oral, resp. rate 17, SpO2 99 %.   Intake/Output Summary (Last 24 hours) at 08/31/2020 1155 Last data filed at 08/30/2020 2145 Gross per 24 hour  Intake 1283.26 ml  Output --  Net 1283.26 ml    Exam  Awake Alert, No new F.N deficits, Normal affect [place].AT,PERRAL Supple Neck,No JVD, No cervical lymphadenopathy appriciated.  Symmetrical Chest wall movement, Good air movement bilaterally, CTAB RRR,No Gallops,Rubs or new Murmurs, No Parasternal Heave +ve B.Sounds, Abd Soft, Non tender, No organomegaly appriciated, No rebound -guarding or rigidity. No Cyanosis, Clubbing or edema, No new Rash or bruise   Data Review   CBC w Diff:  Lab Results  Component Value Date   WBC 21.3 (H) 08/31/2020   HGB 14.9 08/31/2020   HCT 46.3 08/31/2020   PLT 238 08/31/2020   LYMPHOPCT 6 08/31/2020   MONOPCT 3 08/31/2020   EOSPCT 0 08/31/2020   BASOPCT 0 08/31/2020    CMP:  Lab Results  Component Value Date   NA 136 08/31/2020   K 4.6 08/31/2020   CL 104 08/31/2020   CO2 24 08/31/2020   BUN 21 (H) 08/31/2020   CREATININE 1.03 08/31/2020   PROT 6.3 (L) 08/31/2020   ALBUMIN 3.4 (L) 08/31/2020   BILITOT 0.4 08/31/2020   ALKPHOS 66 08/31/2020   AST 14 (L) 08/31/2020   ALT 16 08/31/2020  .   Total Time in preparing paper work, data evaluation and todays exam - 17 minutes  Lala Lund M.D on 08/31/2020 at 11:55 AM  [facility]

## 2020-08-31 NOTE — Discharge Instructions (Signed)
Follow with Primary MD Pavelock, Ralene Bathe, MD in 7 days   Get CBC, CMP, 2 view Chest X ray -  checked next visit within 1 week by Primary MD    Activity: As tolerated with Full fall precautions use walker/cane & assistance as needed  Disposition Home    Diet: Heart Healthy    Special Instructions: If you have smoked or chewed Tobacco  in the last 2 yrs please stop smoking, stop any regular Alcohol  and or any Recreational drug use.  On your next visit with your primary care physician please Get Medicines reviewed and adjusted.  Please request your Prim.MD to go over all Hospital Tests and Procedure/Radiological results at the follow up, please get all Hospital records sent to your Prim MD by signing hospital release before you go home.  If you experience worsening of your admission symptoms, develop shortness of breath, life threatening emergency, suicidal or homicidal thoughts you must seek medical attention immediately by calling 911 or calling your MD immediately  if symptoms less severe.  You Must read complete instructions/literature along with all the possible adverse reactions/side effects for all the Medicines you take and that have been prescribed to you. Take any new Medicines after you have completely understood and accpet all the possible adverse reactions/side effects.

## 2020-09-12 ENCOUNTER — Telehealth: Payer: Self-pay | Admitting: *Deleted

## 2020-09-12 MED ORDER — OXYCODONE HCL 15 MG PO TABS: 15.0000 mg | ORAL_TABLET | Freq: Three times a day (TID) | ORAL | 0 refills | Status: DC | PRN

## 2020-09-12 NOTE — Telephone Encounter (Signed)
Patient is calling for an medication refill on his pain medicine,completely out. Please advise.

## 2020-09-12 NOTE — Telephone Encounter (Signed)
Sent please inform 

## 2020-09-13 ENCOUNTER — Other Ambulatory Visit: Payer: Self-pay

## 2020-09-13 ENCOUNTER — Ambulatory Visit (INDEPENDENT_AMBULATORY_CARE_PROVIDER_SITE_OTHER): Payer: Medicare Other | Admitting: Podiatry

## 2020-09-13 ENCOUNTER — Telehealth: Payer: Self-pay | Admitting: *Deleted

## 2020-09-13 DIAGNOSIS — M792 Neuralgia and neuritis, unspecified: Secondary | ICD-10-CM

## 2020-09-13 DIAGNOSIS — G5782 Other specified mononeuropathies of left lower limb: Secondary | ICD-10-CM

## 2020-09-13 NOTE — Telephone Encounter (Signed)
Returned call to patient to inform that medication has been sent to pharmacy on file. Verbalized understanding and said that he is scheduled for his f/u today.

## 2020-09-13 NOTE — Progress Notes (Signed)
  Subjective:  Patient ID: [name], male    DOB: 09-09-1960,  MRN: [id number]  Chief Complaint  Patient presents with   Routine Post Op    POV #4 DOS 06/15/2020 LEFT POSTERIOR LEG DECOMPRESSION OF NERVE Reports extensive burning pains yesterday, relieved when received pain meds. Denies any other concerns.    DOS: 06/15/20 Procedure: Decompression/excision of neuroma left leg  60 y.o. male presents with the above complaint. History confirmed with patient.  Objective:  Physical Exam: no tenderness but absent sensation lateral leg sural distribution. Incision: healed  Assessment:   1. Sural neuritis, left   2. Neuralgia      Plan:  Patient was evaluated and treated and all questions answered.  Post-operative State -Continues to have burning, with flareup yesterday, but better since surgery. Will await further nerve improvement.  -F/u in 1 month for recheck.  Return in about 4 weeks (around 10/11/2020) for Neuritis f/u.

## 2020-09-30 ENCOUNTER — Telehealth: Payer: Self-pay | Admitting: *Deleted

## 2020-09-30 MED ORDER — OXYCODONE HCL 15 MG PO TABS: 15.0000 mg | ORAL_TABLET | Freq: Three times a day (TID) | ORAL | 0 refills | Status: DC | PRN

## 2020-09-30 NOTE — Telephone Encounter (Signed)
Patient is calling for an early refill of his pain medicine since going out of town and will not be back until next Tuesday? Please advise.

## 2020-09-30 NOTE — Telephone Encounter (Signed)
Called,no answer, left vmessage that prescription has been sent to pharmacy

## 2020-10-11 ENCOUNTER — Other Ambulatory Visit: Payer: Self-pay

## 2020-10-11 ENCOUNTER — Ambulatory Visit (INDEPENDENT_AMBULATORY_CARE_PROVIDER_SITE_OTHER): Payer: Medicare Other | Admitting: Podiatry

## 2020-10-11 DIAGNOSIS — G5782 Other specified mononeuropathies of left lower limb: Secondary | ICD-10-CM

## 2020-10-11 MED ORDER — PREGABALIN 50 MG PO CAPS: 50.0000 mg | ORAL_CAPSULE | Freq: Two times a day (BID) | ORAL | 0 refills | Status: DC

## 2020-10-11 MED ORDER — BETAMETHASONE SOD PHOS & ACET 6 (3-3) MG/ML IJ SUSP
6.0000 mg | Freq: Once | INTRAMUSCULAR | Status: AC
  Administered 2020-10-11: 6 mg

## 2020-10-11 MED ORDER — OXYCODONE HCL 15 MG PO TABS: 15.0000 mg | ORAL_TABLET | Freq: Three times a day (TID) | ORAL | 0 refills | Status: DC | PRN

## 2020-10-11 NOTE — Progress Notes (Signed)
  Subjective:  Patient ID: [name], male    DOB: October 11, 1960,  MRN: [id number]  Chief Complaint  Patient presents with   Sural neuritis    Left foot sural neuritis of left. Pt states pain is unchanged. Pt states he is just dealing with the pain.    DOS: 06/15/20 Procedure: Decompression/excision of neuroma left leg  60 y.o. male presents with the above complaint. History confirmed with patient.  Objective:  Physical Exam: mild tenderness at posterior leg area of previous surgeryl; absent sensation lateral leg sural distribution. Incision: healed  Assessment:   1. Sural neuritis, left    Plan:  Patient was evaluated and treated and all questions answered.  Sural Neuritis -Repeat injection as below  Procedure: Neuroma Injection Location: Left sural nerve Skin Prep: Alcohol. Injectate: 0.5 cc 0.5% marcaine plain, 1 cc betamethasone acetate-betamethasone sodium phosphate Disposition: Patient tolerated procedure well. Injection site dressed with a band-aid.   Return in about 1 month (around 11/11/2020).

## 2020-10-24 ENCOUNTER — Telehealth: Payer: Self-pay | Admitting: Podiatry

## 2020-10-24 MED ORDER — OXYCODONE HCL 15 MG PO TABS: 15.0000 mg | ORAL_TABLET | Freq: Three times a day (TID) | ORAL | 0 refills | Status: DC | PRN

## 2020-10-24 NOTE — Telephone Encounter (Signed)
Pt called requesting refill on pain meds. Please advise.

## 2020-10-24 NOTE — Addendum Note (Signed)
Addended by: Hardie Pulley on: 10/24/2020 04:18 PM   Modules accepted: Orders

## 2020-11-04 ENCOUNTER — Telehealth: Payer: Self-pay | Admitting: Podiatry

## 2020-11-04 MED ORDER — OXYCODONE HCL 15 MG PO TABS: 15.0000 mg | ORAL_TABLET | Freq: Three times a day (TID) | ORAL | 0 refills | Status: DC | PRN

## 2020-11-04 NOTE — Telephone Encounter (Signed)
Pt called requesting refill on pain meds. He states he'll be out on Sunday. Please advise.

## 2020-11-04 NOTE — Addendum Note (Signed)
Addended by: Hardie Pulley on: 11/04/2020 12:04 PM   Modules accepted: Orders

## 2020-11-11 ENCOUNTER — Ambulatory Visit: Payer: Medicare Other | Admitting: Podiatry

## 2020-11-14 ENCOUNTER — Telehealth: Payer: Self-pay | Admitting: *Deleted

## 2020-11-14 MED ORDER — OXYCODONE HCL 15 MG PO TABS: 15.0000 mg | ORAL_TABLET | Freq: Three times a day (TID) | ORAL | 0 refills | Status: DC | PRN

## 2020-11-14 NOTE — Telephone Encounter (Signed)
Patient is calling to request a pain medication refi/ll of ocycodone 15 mg , has an appointment on 11/15/20. Please advise.

## 2020-11-15 ENCOUNTER — Ambulatory Visit (INDEPENDENT_AMBULATORY_CARE_PROVIDER_SITE_OTHER): Payer: Medicare Other | Admitting: Podiatry

## 2020-11-15 DIAGNOSIS — Z5329 Procedure and treatment not carried out because of patient's decision for other reasons: Secondary | ICD-10-CM

## 2020-11-24 ENCOUNTER — Telehealth: Payer: Self-pay | Admitting: *Deleted

## 2020-11-24 MED ORDER — OXYCODONE HCL 15 MG PO TABS: 15.0000 mg | ORAL_TABLET | Freq: Three times a day (TID) | ORAL | 0 refills | Status: DC | PRN

## 2020-11-24 NOTE — Telephone Encounter (Signed)
Returned call to patient, no answer, left vmessage that medication has been approved and sent to pharmacy on file.

## 2020-11-24 NOTE — Telephone Encounter (Signed)
Patient is requesting a refill of his pain medicine, going out of town and will not be back until 11/29/20.Please advise.

## 2020-12-02 ENCOUNTER — Other Ambulatory Visit: Payer: Self-pay | Admitting: Sports Medicine

## 2020-12-02 ENCOUNTER — Telehealth: Payer: Self-pay | Admitting: Podiatry

## 2020-12-02 MED ORDER — OXYCODONE HCL 15 MG PO TABS: 15.0000 mg | ORAL_TABLET | Freq: Three times a day (TID) | ORAL | 0 refills | Status: DC | PRN

## 2020-12-02 NOTE — Progress Notes (Signed)
Refill for Oxycodone for pain sent to pharmacy

## 2020-12-02 NOTE — Telephone Encounter (Signed)
Patient is requesting a refill on pain meds, he will be out tomorrow evening   Please advise

## 2020-12-09 ENCOUNTER — Ambulatory Visit (INDEPENDENT_AMBULATORY_CARE_PROVIDER_SITE_OTHER): Payer: Medicare Other | Admitting: Podiatry

## 2020-12-09 DIAGNOSIS — Z5329 Procedure and treatment not carried out because of patient's decision for other reasons: Secondary | ICD-10-CM

## 2020-12-15 ENCOUNTER — Other Ambulatory Visit: Payer: Self-pay | Admitting: Podiatry

## 2020-12-15 ENCOUNTER — Telehealth: Payer: Self-pay | Admitting: *Deleted

## 2020-12-15 MED ORDER — OXYCODONE HCL 15 MG PO TABS: 15.0000 mg | ORAL_TABLET | Freq: Three times a day (TID) | ORAL | 0 refills | Status: DC | PRN

## 2020-12-15 NOTE — Telephone Encounter (Signed)
Returned the call to patient,informed that medication has been approved and sent to pharmacy on file,gave information per Dr Jacqualyn Posey concerning his upcoming appointment/refills, verbalized understanding.

## 2020-12-15 NOTE — Telephone Encounter (Signed)
Patient is calling to request a refill of pain medicine (Oxycodone- 15 mg)and to schedule a f/u appointment.

## 2020-12-15 NOTE — Telephone Encounter (Signed)
Patient has an appointment 12/22/20 but will need his pain medicine asap.Please advise.

## 2020-12-22 ENCOUNTER — Ambulatory Visit (INDEPENDENT_AMBULATORY_CARE_PROVIDER_SITE_OTHER): Payer: Medicare Other | Admitting: Podiatrist

## 2020-12-22 ENCOUNTER — Encounter: Payer: Self-pay | Admitting: Podiatrist

## 2020-12-22 ENCOUNTER — Other Ambulatory Visit: Payer: Self-pay

## 2020-12-22 DIAGNOSIS — G5782 Other specified mononeuropathies of left lower limb: Secondary | ICD-10-CM | POA: Diagnosis not present

## 2020-12-22 MED ORDER — OXYCODONE HCL 15 MG PO TABS: 15.0000 mg | ORAL_TABLET | Freq: Three times a day (TID) | ORAL | 0 refills | Status: DC | PRN

## 2020-12-22 NOTE — Progress Notes (Signed)
  CC: pain left foot-  some improvement after injection     HPI: Patient is 60 y.o. male who presents today for continued pain on the lateral aspect of the foot and ankle of the left.  The patient is being treated by Dr. March Rummage and relates that the injection lasted about a week the Dr. March Rummage recently gave him.  He states that the foot is painful and he is unable to put pressure on the foot.  He relates the pain as burning and tingling in nature.  He also states that pain medication is the only treatment that is allowed him to get any relief from the pain.  No Known Allergies  Review of Systems No fevers, chills, nausea, muscle aches, no difficulty breathing, no calf pain, no chest pain or shortness of breath.   Physical Exam  GENERAL APPEARANCE: Alert, conversant. Appropriately groomed. No acute distress.   Vascular status is intact to the left foot.  Unchanged since last visit.  Neurological sensation is diminished on the lateral aspect of the left foot.  Pain is reported with palpation along the lateral left lateral foot from the fifth metatarsal head proximally to mid calf.  DERMATOLOGIC: skin is warm, supple, and dry.  No open lesions noted.  No rash, no pre ulcerative lesions. Digital nails are asymptomatic.      Assessment   1. Sural neuritis, left      Plan  Discussed potential treatment options with the patient and asked him if he tried the Lyrica Cymbalta combination and he related that he is tried multiple medications combinations that do not work.  He relates that the pain is so significant he would consider having an amputation. He states that the pain medication is the only treatment that seems to do anything to help him.  I did discuss that he may need a referral to pain management for medication management.  I will have him back to see Dr. March Rummage as he is familiar with this patient.  Today I did agree to write him for some more pain medication in the interim.

## 2020-12-26 ENCOUNTER — Inpatient Hospital Stay (HOSPITAL_COMMUNITY)
Admission: EM | Admit: 2020-12-26 | Discharge: 2020-12-28 | DRG: 190 | Disposition: A | Discharge status: Home / Self Care | Payer: Medicare Other | Attending: Family Medicine | Admitting: Family Medicine

## 2020-12-26 ENCOUNTER — Emergency Department (HOSPITAL_COMMUNITY): Payer: Medicare Other

## 2020-12-26 ENCOUNTER — Other Ambulatory Visit: Payer: Self-pay

## 2020-12-26 DIAGNOSIS — J189 Pneumonia, unspecified organism: Secondary | ICD-10-CM

## 2020-12-26 DIAGNOSIS — Z20822 Contact with and (suspected) exposure to covid-19: Secondary | ICD-10-CM | POA: Diagnosis present

## 2020-12-26 DIAGNOSIS — J9601 Acute respiratory failure with hypoxia: Secondary | ICD-10-CM | POA: Diagnosis present

## 2020-12-26 DIAGNOSIS — I5022 Chronic systolic (congestive) heart failure: Secondary | ICD-10-CM | POA: Diagnosis present

## 2020-12-26 DIAGNOSIS — Z808 Family history of malignant neoplasm of other organs or systems: Secondary | ICD-10-CM

## 2020-12-26 DIAGNOSIS — F17210 Nicotine dependence, cigarettes, uncomplicated: Secondary | ICD-10-CM | POA: Diagnosis present

## 2020-12-26 DIAGNOSIS — J441 Chronic obstructive pulmonary disease with (acute) exacerbation: Principal | ICD-10-CM | POA: Diagnosis present

## 2020-12-26 DIAGNOSIS — I429 Cardiomyopathy, unspecified: Secondary | ICD-10-CM | POA: Diagnosis present

## 2020-12-26 DIAGNOSIS — Z7982 Long term (current) use of aspirin: Secondary | ICD-10-CM

## 2020-12-26 DIAGNOSIS — Z23 Encounter for immunization: Secondary | ICD-10-CM

## 2020-12-26 DIAGNOSIS — I10 Essential (primary) hypertension: Secondary | ICD-10-CM | POA: Diagnosis present

## 2020-12-26 DIAGNOSIS — Z8249 Family history of ischemic heart disease and other diseases of the circulatory system: Secondary | ICD-10-CM

## 2020-12-26 DIAGNOSIS — J9621 Acute and chronic respiratory failure with hypoxia: Secondary | ICD-10-CM | POA: Diagnosis present

## 2020-12-26 DIAGNOSIS — I110 Hypertensive heart disease with heart failure: Secondary | ICD-10-CM | POA: Diagnosis present

## 2020-12-26 DIAGNOSIS — Z79899 Other long term (current) drug therapy: Secondary | ICD-10-CM

## 2020-12-26 LAB — CBC WITH DIFFERENTIAL/PLATELET
Abs Immature Granulocytes: 0.01 10*3/uL (ref 0.00–0.07)
Basophils Absolute: 0 10*3/uL (ref 0.0–0.1)
Basophils Relative: 1 %
Eosinophils Absolute: 0.1 10*3/uL (ref 0.0–0.5)
Eosinophils Relative: 2 %
HCT: 49.1 % (ref 39.0–52.0)
Hemoglobin: 15.9 g/dL (ref 13.0–17.0)
Immature Granulocytes: 0 %
Lymphocytes Relative: 47 %
Lymphs Abs: 2.8 10*3/uL (ref 0.7–4.0)
MCH: 29.1 pg (ref 26.0–34.0)
MCHC: 32.4 g/dL (ref 30.0–36.0)
MCV: 89.8 fL (ref 80.0–100.0)
Monocytes Absolute: 0.5 10*3/uL (ref 0.1–1.0)
Monocytes Relative: 8 %
Neutro Abs: 2.5 10*3/uL (ref 1.7–7.7)
Neutrophils Relative %: 42 %
Platelets: 235 10*3/uL (ref 150–400)
RBC: 5.47 MIL/uL (ref 4.22–5.81)
RDW: 13.9 % (ref 11.5–15.5)
WBC: 6 10*3/uL (ref 4.0–10.5)
nRBC: 0 % (ref 0.0–0.2)

## 2020-12-26 LAB — BASIC METABOLIC PANEL
Anion gap: 8 (ref 5–15)
BUN: 7 mg/dL (ref 6–20)
CO2: 26 mmol/L (ref 22–32)
Calcium: 9.3 mg/dL (ref 8.9–10.3)
Chloride: 106 mmol/L (ref 98–111)
Creatinine, Ser: 0.91 mg/dL (ref 0.61–1.24)
GFR, Estimated: >60 mL/min (ref >60)
Glucose, Bld: 106 mg/dL — ABNORMAL HIGH (ref 70–99)
Potassium: 4.3 mmol/L (ref 3.5–5.1)
Sodium: 140 mmol/L (ref 135–145)

## 2020-12-26 LAB — RESP PANEL BY RT-PCR (FLU A&B, COVID) ARPGX2
Influenza A by PCR: NEGATIVE
Influenza B by PCR: NEGATIVE
SARS Coronavirus 2 by RT PCR: NEGATIVE

## 2020-12-26 LAB — BRAIN NATRIURETIC PEPTIDE: B Natriuretic Peptide: 29.7 pg/mL (ref 0.0–100.0)

## 2020-12-26 LAB — TROPONIN I (HIGH SENSITIVITY): Troponin I (High Sensitivity): 6 ng/L (ref <18)

## 2020-12-26 MED ORDER — ACETAMINOPHEN 650 MG RE SUPP: 650.0000 mg | Freq: Four times a day (QID) | RECTAL | Status: DC | PRN

## 2020-12-26 MED ORDER — IPRATROPIUM-ALBUTEROL 0.5-2.5 (3) MG/3ML IN SOLN
3.0000 mL | Freq: Once | RESPIRATORY_TRACT | Status: AC
  Administered 2020-12-26: 3 mL via RESPIRATORY_TRACT
  Filled 2020-12-26: qty 3

## 2020-12-26 MED ORDER — ALBUTEROL SULFATE (2.5 MG/3ML) 0.083% IN NEBU
2.5000 mg | INHALATION_SOLUTION | RESPIRATORY_TRACT | Status: DC | PRN
  Administered 2020-12-26: 2.5 mg via RESPIRATORY_TRACT
  Filled 2020-12-26: qty 3

## 2020-12-26 MED ORDER — GUAIFENESIN ER 600 MG PO TB12
600.0000 mg | ORAL_TABLET | Freq: Two times a day (BID) | ORAL | Status: DC | PRN
  Administered 2020-12-26: 600 mg via ORAL
  Filled 2020-12-26: qty 1

## 2020-12-26 MED ORDER — MORPHINE SULFATE (PF) 2 MG/ML IV SOLN
2.0000 mg | INTRAVENOUS | Status: DC | PRN
  Administered 2020-12-27 – 2020-12-28 (×3): 2 mg via INTRAVENOUS
  Filled 2020-12-26 (×3): qty 1

## 2020-12-26 MED ORDER — MOMETASONE FURO-FORMOTEROL FUM 200-5 MCG/ACT IN AERO
2.0000 | INHALATION_SPRAY | Freq: Two times a day (BID) | RESPIRATORY_TRACT | Status: DC
  Administered 2020-12-27: 2 via RESPIRATORY_TRACT
  Filled 2020-12-26: qty 8.8

## 2020-12-26 MED ORDER — BISACODYL 5 MG PO TBEC: 5.0000 mg | DELAYED_RELEASE_TABLET | Freq: Every day | ORAL | Status: DC | PRN

## 2020-12-26 MED ORDER — DOCUSATE SODIUM 100 MG PO CAPS
100.0000 mg | ORAL_CAPSULE | Freq: Two times a day (BID) | ORAL | Status: DC
  Administered 2020-12-26 – 2020-12-28 (×5): 100 mg via ORAL
  Filled 2020-12-26 (×5): qty 1

## 2020-12-26 MED ORDER — POLYETHYLENE GLYCOL 3350 17 G PO PACK: 17.0000 g | PACK | Freq: Every day | ORAL | Status: DC | PRN

## 2020-12-26 MED ORDER — ONDANSETRON HCL 4 MG/2ML IJ SOLN: 4.0000 mg | Freq: Four times a day (QID) | INTRAMUSCULAR | Status: DC | PRN

## 2020-12-26 MED ORDER — ASPIRIN 81 MG PO CHEW
81.0000 mg | CHEWABLE_TABLET | Freq: Every day | ORAL | Status: DC
  Administered 2020-12-27 – 2020-12-28 (×2): 81 mg via ORAL
  Filled 2020-12-26 (×2): qty 1

## 2020-12-26 MED ORDER — ISOSORBIDE MONONITRATE ER 30 MG PO TB24
30.0000 mg | ORAL_TABLET | Freq: Every day | ORAL | Status: DC
  Administered 2020-12-26 – 2020-12-28 (×3): 30 mg via ORAL
  Filled 2020-12-26 (×3): qty 1

## 2020-12-26 MED ORDER — METHYLPREDNISOLONE SODIUM SUCC 125 MG IJ SOLR
80.0000 mg | Freq: Two times a day (BID) | INTRAMUSCULAR | Status: AC
Start: 2020-12-26 — End: 2020-12-27
  Administered 2020-12-26 – 2020-12-27 (×2): 80 mg via INTRAVENOUS
  Filled 2020-12-26 (×2): qty 2

## 2020-12-26 MED ORDER — KETOROLAC TROMETHAMINE 30 MG/ML IJ SOLN
30.0000 mg | Freq: Once | INTRAMUSCULAR | Status: AC
  Administered 2020-12-26: 30 mg via INTRAVENOUS
  Filled 2020-12-26: qty 1

## 2020-12-26 MED ORDER — OXYCODONE HCL 5 MG PO TABS
15.0000 mg | ORAL_TABLET | Freq: Once | ORAL | Status: AC
Start: 2020-12-26 — End: 2020-12-26
  Administered 2020-12-26: 15 mg via ORAL
  Filled 2020-12-26: qty 3

## 2020-12-26 MED ORDER — ACETAMINOPHEN 325 MG PO TABS
650.0000 mg | ORAL_TABLET | Freq: Once | ORAL | Status: AC
  Administered 2020-12-26: 650 mg via ORAL
  Filled 2020-12-26: qty 2

## 2020-12-26 MED ORDER — CHLORTHALIDONE 25 MG PO TABS
25.0000 mg | ORAL_TABLET | Freq: Every day | ORAL | Status: DC
  Administered 2020-12-26 – 2020-12-28 (×3): 25 mg via ORAL
  Filled 2020-12-26 (×3): qty 1

## 2020-12-26 MED ORDER — ONDANSETRON HCL 4 MG PO TABS: 4.0000 mg | ORAL_TABLET | Freq: Four times a day (QID) | ORAL | Status: DC | PRN

## 2020-12-26 MED ORDER — SODIUM CHLORIDE 0.9 % IV SOLN
1.0000 g | Freq: Once | INTRAVENOUS | Status: AC
  Administered 2020-12-26: 1 g via INTRAVENOUS
  Filled 2020-12-26: qty 10

## 2020-12-26 MED ORDER — SODIUM CHLORIDE 0.9 % IV SOLN
2.0000 g | Freq: Three times a day (TID) | INTRAVENOUS | Status: DC
  Administered 2020-12-26 – 2020-12-28 (×6): 2 g via INTRAVENOUS
  Filled 2020-12-26 (×6): qty 2

## 2020-12-26 MED ORDER — ENOXAPARIN SODIUM 60 MG/0.6ML IJ SOSY
0.5000 mg/kg | PREFILLED_SYRINGE | INTRAMUSCULAR | Status: DC
  Administered 2020-12-26 – 2020-12-27 (×2): 60 mg via SUBCUTANEOUS
  Filled 2020-12-26 (×3): qty 0.6

## 2020-12-26 MED ORDER — IPRATROPIUM-ALBUTEROL 0.5-2.5 (3) MG/3ML IN SOLN
3.0000 mL | Freq: Four times a day (QID) | RESPIRATORY_TRACT | Status: DC
  Administered 2020-12-26 – 2020-12-28 (×8): 3 mL via RESPIRATORY_TRACT
  Filled 2020-12-26 (×8): qty 3

## 2020-12-26 MED ORDER — ACETAMINOPHEN 325 MG PO TABS: 650.0000 mg | ORAL_TABLET | Freq: Four times a day (QID) | ORAL | Status: DC | PRN

## 2020-12-26 MED ORDER — MAGNESIUM SULFATE 2 GM/50ML IV SOLN
2.0000 g | Freq: Once | INTRAVENOUS | Status: AC
  Administered 2020-12-26: 2 g via INTRAVENOUS
  Filled 2020-12-26: qty 50

## 2020-12-26 MED ORDER — OXYCODONE HCL 5 MG PO TABS
15.0000 mg | ORAL_TABLET | Freq: Three times a day (TID) | ORAL | Status: DC | PRN
  Administered 2020-12-26 – 2020-12-28 (×4): 15 mg via ORAL
  Filled 2020-12-26 (×4): qty 3

## 2020-12-26 MED ORDER — HYDRALAZINE HCL 20 MG/ML IJ SOLN: 5.0000 mg | INTRAMUSCULAR | Status: DC | PRN

## 2020-12-26 MED ORDER — NICOTINE 14 MG/24HR TD PT24
14.0000 mg | MEDICATED_PATCH | TRANSDERMAL | Status: DC
  Administered 2020-12-26 – 2020-12-27 (×2): 14 mg via TRANSDERMAL
  Filled 2020-12-26 (×2): qty 1

## 2020-12-26 MED ORDER — PREDNISONE 20 MG PO TABS
40.0000 mg | ORAL_TABLET | Freq: Every day | ORAL | Status: DC
  Administered 2020-12-27 – 2020-12-28 (×2): 40 mg via ORAL
  Filled 2020-12-26 (×2): qty 2

## 2020-12-26 MED ORDER — AMLODIPINE BESYLATE 10 MG PO TABS
10.0000 mg | ORAL_TABLET | Freq: Every day | ORAL | Status: DC
  Administered 2020-12-26 – 2020-12-28 (×3): 10 mg via ORAL
  Filled 2020-12-26 (×2): qty 2
  Filled 2020-12-26: qty 1

## 2020-12-26 MED ORDER — NEBIVOLOL HCL 5 MG PO TABS
2.5000 mg | ORAL_TABLET | Freq: Every day | ORAL | Status: DC
  Administered 2020-12-26 – 2020-12-28 (×3): 2.5 mg via ORAL
  Filled 2020-12-26 (×3): qty 1

## 2020-12-26 MED ORDER — SODIUM CHLORIDE 0.9 % IV SOLN
500.0000 mg | Freq: Once | INTRAVENOUS | Status: AC
  Administered 2020-12-26: 500 mg via INTRAVENOUS
  Filled 2020-12-26: qty 500

## 2020-12-26 MED ORDER — PREGABALIN 50 MG PO CAPS
50.0000 mg | ORAL_CAPSULE | Freq: Two times a day (BID) | ORAL | Status: DC
  Administered 2020-12-26 – 2020-12-28 (×5): 50 mg via ORAL
  Filled 2020-12-26: qty 2
  Filled 2020-12-26: qty 1
  Filled 2020-12-26: qty 2
  Filled 2020-12-26: qty 1
  Filled 2020-12-26: qty 2

## 2020-12-26 MED ORDER — SODIUM CHLORIDE 0.9% FLUSH
3.0000 mL | Freq: Two times a day (BID) | INTRAVENOUS | Status: DC
  Administered 2020-12-27 – 2020-12-28 (×2): 3 mL via INTRAVENOUS

## 2020-12-26 NOTE — ED Notes (Signed)
Got patient off the bedpan patient is resting with call bell in reach 

## 2020-12-26 NOTE — H&P (Signed)
History and Physical    [name] TIW:[id number] DOB: November 10, 1960 DOA: 12/26/2020  PCP: [name], MD Consultants:  March Rummage - podiatry Patient coming from:  Home; NOK: [place], [phone]  Chief Complaint: SOB  HPI: [name] is a 60 y.o. male with medical history significant of COPD; HTN; and chronic systolic CHF presenting with SOB.  He reports 4 days of SOB, which started after he painted epoxy on a house.  He could sense it was coming on and has been using his inhalers/nebs without improvement.  +nonproductive cough.  +constant wheezing/SOB.  Continues to smoke 1 ppd.  He has been on BIPAP in the past.  BIPAP was started and he did report improved symptoms.    ED Course: COPD, still smokes.  3-4 days worsening SOB.  CXR with ?infiltrate.  Given nebs, steroids, antibiotics.  90s on room air.   Giving neb and Mag++ now.  Review of Systems: As per HPI; otherwise review of systems reviewed and negative.   Ambulatory Status:  Ambulates without assistance   Past Medical History:  Diagnosis Date   At risk for sleep apnea    10-14-2019  ---   STOP--BANG SCORE = 5   (screening routed  to pt's pcp in epic)   Cardiomyopathy ([place]) 2009   per cardiac cath 12-10-2007 ef 35-40%;    last echo 04-27-2015 in ef 55-60%   COPD (chronic obstructive pulmonary disease) ([place])    previously seen pulmonologist-- dr Melvyn Novas--- GOLD 0-- hx multiple exacerbation's last one 04-23-2019 w/ ARF hypoxia (10-14-2019  per pt followed by pcp;  pt was on symbicort bid and duoneb and rescue inhaler as needed  ,  pt stated has productive smoker's cough but no sob or difficulty breathing;  last used nebulizer one week ago and not used rescue inhaler in along time)   DDD (degenerative disc disease), cervical    ALL OVER   History of nuclear stress test    epic 04-08-2015   low risk w/ small inferior wall scar of mild severity focus of reversible ischemia, ef 61%   Hypertension     followed by pcp   Neuroma of left leg    with neuritis   Plantar fasciitis, left     Past Surgical History:  Procedure Laterality Date   ACHILLES TENDON REPAIR Left 2015   BICEPS TENDON REPAIR Right 2018   CARDIAC CATHETERIZATION  12-10-2007  dr al little   no evidence cad, mild to moderate LVD, ef 35-40%   GASTROC RECESSION EXTREMITY Left 10/22/2018   Procedure: GASTROC RECESSION EXTREMITY;  Surgeon: [name], DPM;  Location: [place];  Service: Podiatry;  Laterality: Left;   HEEL SPUR RESECTION Left 10/22/2018   Procedure: HEEL SPUR RESECTION;  Surgeon: [name], DPM;  Location: [place];  Service: Podiatry;  Laterality: Left;   HEEL SPUR RESECTION Left 10/21/2019   Procedure: HEEL SPUR RESECTION;  Surgeon: [name], DPM;  Location: [place];  Service: Podiatry;  Laterality: Left;   HIP SURGERY Left 2005 approx.   KNEE SURGERY Bilateral x7   last one 2010  approx.   NERVE REPAIR Left 07/01/2019   Procedure: Left leg repair/decompression of sural neuroma;  Surgeon: [name], DPM;  Location: [facility];  Service: Podiatry;  Laterality: Left;  PRONE   NERVE REPAIR Left 06/15/2020   Procedure: Decompression of sural neuroma LEFT CALF TRANSPOSITION INTO MUSCLE ;  Surgeon: March Rummage,  Christian Mate, DPM;  Location: [facility];  Service: Podiatry;  Laterality: Left;  PRONE   PLANTAR FASCIA RELEASE Left 10/22/2018   Procedure: ENDOSCOPIC PLANTAR FASCIOTOMY;  Surgeon: [name], DPM;  Location: [place];  Service: Podiatry;  Laterality: Left;   PLANTAR FASCIA RELEASE Left 10/21/2019   Procedure: ENDOSCOPIC PLANTAR FASCIA RELEASE; EXCISION SKIN LESION;  Surgeon: [name], DPM;  Location: [place];  Service: Podiatry;  Laterality: Left;    Social History   Socioeconomic History   Marital status: Single    Spouse name: Not on file   Number of  children: Not on file   Years of education: Not on file   Highest education level: Not on file  Occupational History   Not on file  Tobacco Use   Smoking status: Every Day    Packs/day: 0.75    Years: 28.00    Pack years: 21.00    Types: Cigarettes   Smokeless tobacco: Never  Vaping Use   Vaping Use: Never used  Substance and Sexual Activity   Alcohol use: Yes    Comment: occasional   Drug use: Never   Sexual activity: Not on file  Other Topics Concern   Not on file  Social History Narrative   Not on file   Social Determinants of Health   Financial Resource Strain: Not on file  Food Insecurity: Not on file  Transportation Needs: Not on file  Physical Activity: Not on file  Stress: Not on file  Social Connections: Not on file  Intimate Partner Violence: Not on file    No Known Allergies  Family History  Problem Relation Age of Onset   CAD Father    Cancer Father    Bone cancer Mother    Colon cancer Neg Hx    Colon polyps Neg Hx    Esophageal cancer Neg Hx    Rectal cancer Neg Hx    Stomach cancer Neg Hx     Prior to Admission medications   Medication Sig Start Date End Date Taking? Authorizing Provider  albuterol (VENTOLIN HFA) 108 (90 Base) MCG/ACT inhaler Inhale 2 puffs into the lungs every 4 (four) hours as needed for wheezing or shortness of breath. 04/25/19   Nita Sells, MD  amLODipine (NORVASC) 10 MG tablet Take 1 tablet (10 mg total) by mouth daily. 08/31/20 08/31/21  Thurnell Lose, MD  amLODipine (NORVASC) 10 MG tablet amlodipine 10 mg tablet  Take 1 tablet every day by oral route for 90 days.    [provider]  aspirin 81 MG chewable tablet Chew 1 tablet (81 mg total) by mouth daily. 08/31/20   Thurnell Lose, MD  chlorthalidone (HYGROTON) 25 MG tablet chlorthalidone 25 mg tablet  TAKE 1 TABLET BY MOUTH EVERY DAY    [provider]  ipratropium-albuterol (DUONEB) 0.5-2.5 (3) MG/3ML SOLN Take 3 mLs by nebulization 3  (three) times daily. Patient taking differently: Take 3 mLs by nebulization every 4 (four) hours as needed (shortness of breath). 04/25/19   Nita Sells, MD  isosorbide mononitrate (IMDUR) 30 MG 24 hr tablet Take 1 tablet (30 mg total) by mouth daily. 08/31/20   Thurnell Lose, MD  nebivolol (BYSTOLIC) 2.5 MG tablet Take 1 tablet (2.5 mg total) by mouth daily. 08/31/20   Thurnell Lose, MD  nicotine (NICODERM CQ - DOSED IN MG/24 HOURS) 14 mg/24hr patch Place 1 patch (14 mg total) onto the skin daily. 08/31/20  Thurnell Lose, MD  oxyCODONE (ROXICODONE) 15 MG immediate release tablet Take 1 tablet (15 mg total) by mouth every 8 (eight) hours as needed. 12/22/20 12/22/21  Bronson Ing, DPM  predniSONE (DELTASONE) 5 MG tablet Label  & dispense according to the schedule below. take 8 Pills PO for 3 days, 6 Pills PO for 3 days, 4 Pills PO for 3 days, 2 Pills PO for 3 days, 1 Pills PO for 3 days, 1/2 Pill  PO for 3 days then STOP. Total 65 pills. 08/31/20   Thurnell Lose, MD  pregabalin (LYRICA) 50 MG capsule Take 1 capsule (50 mg total) by mouth 2 (two) times daily. 10/11/20   [name], DPM  sildenafil (VIAGRA) 100 MG tablet sildenafil 100 mg tablet  Take one tab 30 minutes prior to activity    [provider]    Physical Exam: Vitals:   12/26/20 1615 12/26/20 1645 12/26/20 1700 12/26/20 1715  BP: (!) 144/78 (!) 172/95 (!) 144/79 (!) 147/77  Pulse: 69 63 70 61  Resp: 14 18 16 17   Temp:      TempSrc:      SpO2: 94% 91% 94% 92%  Weight:      Height:         General:  Appeared ill and profoundly wheezy prior to initiation of BIPAP Eyes:  PERRL, EOMI, normal lids, iris ENT:  grossly normal hearing, lips & tongue, mmm Neck:  no LAD, masses or thyromegaly Cardiovascular:  RRR, no m/r/g. No LE edema.  Respiratory:   Marked wheezing, poor air movement.  Increased respiratory effort. Abdomen:  soft, NT, ND Skin:  no rash or induration seen on limited  exam Musculoskeletal:  grossly normal tone BUE/BLE, good ROM, no bony abnormality Psychiatric:  blunted mood and affect, speech fluent and appropriate, AOx3 Neurologic:  CN 2-12 grossly intact, moves all extremities in coordinated fashion    Radiological Exams on Admission: Independently reviewed - see discussion in A/P where applicable  DG Chest Port 1 View  Result Date: 12/26/2020 CLINICAL DATA:  Shortness of breath. Chest pain and cough over the last 4 days. EXAM: PORTABLE CHEST 1 VIEW COMPARISON:  08/31/2020 FINDINGS: Lung bases are not completely included. Heart size is normal. Mediastinal shadows are normal. Small amount of fluid in the fissure or linear scarring on the right. I think the right lung base is clear. Question patchy infiltrate at the left base. Consider two-view chest radiography when able. IMPRESSION: Poor penetration at the lung bases. Question patchy infiltrate at the left base. Consider two-view chest radiography when able. Electronically Signed   By: Nelson Chimes M.D.   On: 12/26/2020 11:14    EKG: Independently reviewed.  NSR with rate 62; no evidence of acute ischemia   Labs on Admission: I have personally reviewed the available labs and imaging studies at the time of the admission.  Pertinent labs:   Unremarkable BMP Normal CBC COVID/flu negative   Assessment/Plan Principal Problem:   COPD with acute exacerbation (HCC) Active Problems:   Cigarette smoker   HTN (hypertension)   Acute on chronic respiratory failure associated with a COPD exacerbation -Patient's shortness of breath and nonproductive cough are most likely caused by acute COPD exacerbation.  -He has required BIPAP in the past -He does not have fever or leukocytosis.  -Chest x-ray is marginal for pneumonia -He was started on BIPAP in the ER -will observe for now on progressive care -Nebulizers: scheduled Duoneb and prn albuterol -Solu-Medrol 80  mg IV BID -> Prednisone 40 mg PO daily -IV  Cefepime -Continue Symbicort (Dulera substitution) -Coordinated care with TOC team/PT/OT/Nutrition/RT consults  HTN -Continue Norvasc, Bystolic  Chronic systolic CHF -Continue ASA, Imdur -Normal Echo on 08/30/20  Ongoing tobacco dependence -Encourage cessation.   -This was discussed with the patient and should be reviewed on an ongoing basis.   -Patch ordered at patient request.     Note: This patient has been tested and is negative for the novel coronavirus COVID-19.    DVT prophylaxis: Lovenox  Code Status:  Full - confirmed with patient Family Communication: None present Disposition Plan:  The patient is from: home  Anticipated d/c is to: home without [facility] services  Anticipated d/c date will depend on clinical response to treatment, but possibly as early as tomorrow if she has excellent response to treatment  Patient is currently: acutely ill Consults called: TOC team/PT/OT/Nutrition/RT  Admission status: It is my clinical opinion that referral for OBSERVATION is reasonable and necessary in this patient based on the above information provided. The aforementioned taken together are felt to place the patient at high risk for further clinical deterioration. However it is anticipated that the patient may be medically stable for discharge from the hospital within 24 to 48 hours.       Karmen Bongo MD [facility]   How to contact the [facility] Attending or Consulting provider [place] or covering provider during after hours [place], for this patient?  Check the care team in [facility] [address] and look for a) attending/consulting [facility] provider listed and b) the [facility] team listed Log into [email or web address] and use [place]'s universal password to access. If you do not have the password, please contact the hospital operator. Locate the [facility] provider you are looking for under [facility] and page to a number that you can be directly reached. If you still have difficulty reaching the provider,  please page the [facility] (Director on Call) for the Hospitalists listed on amion for assistance.   12/26/2020, 6:55 PM

## 2020-12-26 NOTE — ED Triage Notes (Signed)
Pt arrived via [facility] with c/c of shob. Per EMS HA for 2 days and asthma like symptoms for 4 days. COPD Hx. Non-complainants with meds. Pt arrived with wheezing in all fields. Pt endorses chest pain 10/10   1 due-neb, 125 solu-med

## 2020-12-26 NOTE — ED Notes (Signed)
Dinner tray ordered.

## 2020-12-26 NOTE — ED Notes (Signed)
Got patient undressed on the monitor did ekg shown to Dr Karle Starch patient is resting with call bell in reach

## 2020-12-26 NOTE — ED Provider Notes (Signed)
[facility] EMERGENCY DEPARTMENT Provider Note  CSN: [id number] Arrival date & time: 12/26/20 1007    History Chief Complaint  Patient presents with  . Chest Pain  . Shortness of Breath  . Headache    Colbert Gargan is a 60 y.o. male with history of COPD, continues to smoke, reports increasing SOB for the last 4 days, wheezing, dry cough, no fever. Feels tight in his chest, similar to multiple prior episodes, last admitted in Jun 2020, improved with nebs, abx and steroids. He has been using home nebulizer without much improvement. Given solumedrol and a neb enroute. HE has not had to wear oxygen. He thinks he has been intubated before but he is not sure. Complaining of a headache today as well.    Past Medical History:  Diagnosis Date  . At risk for sleep apnea    10-14-2019  ---   STOP--BANG SCORE = 5   (screening routed  to pt's pcp in epic)  . Cardiomyopathy ([place]) 2009   per cardiac cath 12-10-2007 ef 35-40%;    last echo 04-27-2015 in ef 55-60%  . COPD (chronic obstructive pulmonary disease) ([place])    previously seen pulmonologist-- dr Melvyn Novas--- GOLD 0-- hx multiple exacerbation's last one 04-23-2019 w/ ARF hypoxia (10-14-2019  per pt followed by pcp;  pt was on symbicort bid and duoneb and rescue inhaler as needed  ,  pt stated has productive smoker's cough but no sob or difficulty breathing;  last used nebulizer one week ago and not used rescue inhaler in along time)  . DDD (degenerative disc disease), cervical    ALL OVER  . History of nuclear stress test    epic 04-08-2015   low risk w/ small inferior wall scar of mild severity focus of reversible ischemia, ef 61%  . Hypertension    followed by pcp  . Neuroma of left leg    with neuritis  . Plantar fasciitis, left     Past Surgical History:  Procedure Laterality Date  . ACHILLES TENDON REPAIR Left 2015  . BICEPS TENDON REPAIR Right 2018  . CARDIAC CATHETERIZATION  12-10-2007  dr al little   no evidence cad, mild  to moderate LVD, ef 35-40%  . GASTROC RECESSION EXTREMITY Left 10/22/2018   Procedure: GASTROC RECESSION EXTREMITY;  Surgeon: [name], DPM;  Location: [facility];  Service: Podiatry;  Laterality: Left;  . HEEL SPUR RESECTION Left 10/22/2018   Procedure: HEEL SPUR RESECTION;  Surgeon: [name], DPM;  Location: [place];  Service: Podiatry;  Laterality: Left;  . HEEL SPUR RESECTION Left 10/21/2019   Procedure: HEEL SPUR RESECTION;  Surgeon: [name], DPM;  Location: [place];  Service: Podiatry;  Laterality: Left;  . HIP SURGERY Left 2005 approx.  Marland Kitchen KNEE SURGERY Bilateral x7   last one 2010  approx.  Marland Kitchen NERVE REPAIR Left 07/01/2019   Procedure: Left leg repair/decompression of sural neuroma;  Surgeon: [name], DPM;  Location: [facility];  Service: Podiatry;  Laterality: Left;  PRONE  . NERVE REPAIR Left 06/15/2020   Procedure: Decompression of sural neuroma LEFT CALF TRANSPOSITION INTO MUSCLE ;  Surgeon: [name], DPM;  Location: [place];  Service: Podiatry;  Laterality: Left;  PRONE  . PLANTAR FASCIA RELEASE Left 10/22/2018   Procedure: ENDOSCOPIC PLANTAR FASCIOTOMY;  Surgeon: [name], DPM;  Location: [facility];  Service: Podiatry;  Laterality: Left;  .  PLANTAR FASCIA RELEASE Left 10/21/2019   Procedure: ENDOSCOPIC PLANTAR FASCIA RELEASE; EXCISION SKIN LESION;  Surgeon: [name], DPM;  Location: [place];  Service: Podiatry;  Laterality: Left;    Family History  Problem Relation Age of Onset  . CAD Father   . Cancer Father   . Bone cancer Mother   . Colon cancer Neg Hx   . Colon polyps Neg Hx   . Esophageal cancer Neg Hx   . Rectal cancer Neg Hx   . Stomach cancer Neg Hx     Social History   Tobacco Use  . Smoking status: Every Day    Packs/day: 0.75    Years: 28.00    Pack years: 21.00    Types: Cigarettes  .  Smokeless tobacco: Never  Vaping Use  . Vaping Use: Never used  Substance Use Topics  . Alcohol use: Yes    Comment: occasional  . Drug use: Never     Home Medications Prior to Admission medications   Medication Sig Start Date End Date Taking? Authorizing Provider  albuterol (VENTOLIN HFA) 108 (90 Base) MCG/ACT inhaler Inhale 2 puffs into the lungs every 4 (four) hours as needed for wheezing or shortness of breath. 04/25/19   Nita Sells, MD  amLODipine (NORVASC) 10 MG tablet Take 1 tablet (10 mg total) by mouth daily. 08/31/20 08/31/21  Thurnell Lose, MD  amLODipine (NORVASC) 10 MG tablet amlodipine 10 mg tablet  Take 1 tablet every day by oral route for 90 days.    [provider]  aspirin 81 MG chewable tablet Chew 1 tablet (81 mg total) by mouth daily. 08/31/20   Thurnell Lose, MD  chlorthalidone (HYGROTON) 25 MG tablet chlorthalidone 25 mg tablet  TAKE 1 TABLET BY MOUTH EVERY DAY    [provider]  ipratropium-albuterol (DUONEB) 0.5-2.5 (3) MG/3ML SOLN Take 3 mLs by nebulization 3 (three) times daily. Patient taking differently: Take 3 mLs by nebulization every 4 (four) hours as needed (shortness of breath). 04/25/19   Nita Sells, MD  isosorbide mononitrate (IMDUR) 30 MG 24 hr tablet Take 1 tablet (30 mg total) by mouth daily. 08/31/20   Thurnell Lose, MD  nebivolol (BYSTOLIC) 2.5 MG tablet Take 1 tablet (2.5 mg total) by mouth daily. 08/31/20   Thurnell Lose, MD  nicotine (NICODERM CQ - DOSED IN MG/24 HOURS) 14 mg/24hr patch Place 1 patch (14 mg total) onto the skin daily. 08/31/20   Thurnell Lose, MD  oxyCODONE (ROXICODONE) 15 MG immediate release tablet Take 1 tablet (15 mg total) by mouth every 8 (eight) hours as needed. 12/22/20 12/22/21  Bronson Ing, DPM  predniSONE (DELTASONE) 5 MG tablet Label  & dispense according to the schedule below. take 8 Pills PO for 3 days, 6 Pills PO for 3 days, 4 Pills PO for 3 days, 2 Pills PO for 3  days, 1 Pills PO for 3 days, 1/2 Pill  PO for 3 days then STOP. Total 65 pills. 08/31/20   Thurnell Lose, MD  pregabalin (LYRICA) 50 MG capsule Take 1 capsule (50 mg total) by mouth 2 (two) times daily. 10/11/20   [name], DPM  sildenafil (VIAGRA) 100 MG tablet sildenafil 100 mg tablet  Take one tab 30 minutes prior to activity    [provider]     Allergies    Patient has no known allergies.   Review of Systems   Review of Systems A comprehensive  review of systems was completed and negative except as noted in HPI.    Physical Exam BP (!) 149/90 (BP Location: Right Arm)   Pulse 60   Temp 98.8 F (37.1 C) (Oral)   Resp (!) 22   Ht 6\' 1"  (1.854 m)   Wt 117.9 kg   SpO2 100%   BMI 34.30 kg/m   Physical Exam Vitals and nursing note reviewed.  Constitutional:      Appearance: Normal appearance.  HENT:     Head: Normocephalic and atraumatic.     Nose: Nose normal.     Mouth/Throat:     Mouth: Mucous membranes are moist.  Eyes:     Extraocular Movements: Extraocular movements intact.     Conjunctiva/sclera: Conjunctivae normal.  Cardiovascular:     Rate and Rhythm: Normal rate.  Pulmonary:     Effort: Pulmonary effort is normal. Tachypnea present.     Breath sounds: Decreased breath sounds and wheezing present.  Abdominal:     General: Abdomen is flat.     Palpations: Abdomen is soft.     Tenderness: There is no abdominal tenderness.  Musculoskeletal:        General: No swelling. Normal range of motion.     Cervical back: Neck supple.  Skin:    General: Skin is warm and dry.  Neurological:     General: No focal deficit present.     Mental Status: He is alert.  Psychiatric:        Mood and Affect: Mood normal.     ED Results / Procedures / Treatments   Labs (all labs ordered are listed, but only abnormal results are displayed) Labs Reviewed  RESP PANEL BY RT-PCR (FLU A&B, COVID) [id number]  BASIC METABOLIC PANEL  CBC WITH  DIFFERENTIAL/PLATELET  BRAIN NATRIURETIC PEPTIDE  TROPONIN I (HIGH SENSITIVITY)    EKG None  Radiology No results found.  Procedures Procedures  Medications Ordered in the ED Medications  acetaminophen (TYLENOL) tablet 650 mg (has no administration in time range)  ipratropium-albuterol (DUONEB) 0.5-2.5 (3) MG/3ML nebulizer solution 3 mL (has no administration in time range)     MDM Rules/Calculators/A&P MDM Patient here with COPD exacerbation, also having some chest tightness, will check labs and CXR. Already given solumedrol, will give additional nebs, APAP for headache and reassess.   ED Course  I have reviewed the triage vital signs and the nursing notes.  Pertinent labs & imaging results that were available during my care of the patient were reviewed by me and considered in my medical decision making (see chart for details).  Clinical Course as of 12/26/20 1532  Mon Dec 26, 2020  1100 CBC is normal.  [CS]  8185 CXR with possible infiltrate on portable film. Not having a fever or leukocytosis. Will need Abx for COPD exacerbation anyway, so will begin Rocephin/Zithromax, can be de-escalated at a later time.  [CS]  1122 BMP and Trop neg.  [CS]  6314 Patient sleeping, SpO2 94% on room air but still with audible wheezing. Will give additional nebs, Magnesium and discuss with Hospitalist. Still complaining of headache and chest soreness, Toradol for comfort.  [CS]  1201 Spoke with Dr. Lorin Mercy, Hospitalist, who will evaluate the patient for admission.  [CS]    Clinical Course User Index [CS] Truddie Hidden, MD    Final Clinical Impression(s) / ED Diagnoses Final diagnoses:  None    Rx / DC Orders ED Discharge Orders     None  Truddie Hidden, MD 12/26/20 [phone]

## 2020-12-26 NOTE — Progress Notes (Signed)
Per pt, feels his breathing is doing better, wants trial off bipap,  gave HHN via mask, pt breathing tolerated off bipap for HHN tx.  RN in room and aware.  Pt states his breathing is "fine" at the moment.  Sat 95% on room air.

## 2020-12-27 ENCOUNTER — Encounter (HOSPITAL_COMMUNITY): Payer: Self-pay | Admitting: Family Medicine

## 2020-12-27 DIAGNOSIS — I429 Cardiomyopathy, unspecified: Secondary | ICD-10-CM | POA: Diagnosis present

## 2020-12-27 DIAGNOSIS — Z79899 Other long term (current) drug therapy: Secondary | ICD-10-CM | POA: Diagnosis not present

## 2020-12-27 DIAGNOSIS — Z23 Encounter for immunization: Secondary | ICD-10-CM | POA: Diagnosis present

## 2020-12-27 DIAGNOSIS — J441 Chronic obstructive pulmonary disease with (acute) exacerbation: Secondary | ICD-10-CM | POA: Diagnosis present

## 2020-12-27 DIAGNOSIS — F17210 Nicotine dependence, cigarettes, uncomplicated: Secondary | ICD-10-CM | POA: Diagnosis present

## 2020-12-27 DIAGNOSIS — I110 Hypertensive heart disease with heart failure: Secondary | ICD-10-CM | POA: Diagnosis present

## 2020-12-27 DIAGNOSIS — Z20822 Contact with and (suspected) exposure to covid-19: Secondary | ICD-10-CM | POA: Diagnosis present

## 2020-12-27 DIAGNOSIS — I5022 Chronic systolic (congestive) heart failure: Secondary | ICD-10-CM | POA: Diagnosis present

## 2020-12-27 DIAGNOSIS — Z8249 Family history of ischemic heart disease and other diseases of the circulatory system: Secondary | ICD-10-CM | POA: Diagnosis not present

## 2020-12-27 DIAGNOSIS — Z7982 Long term (current) use of aspirin: Secondary | ICD-10-CM | POA: Diagnosis not present

## 2020-12-27 DIAGNOSIS — Z808 Family history of malignant neoplasm of other organs or systems: Secondary | ICD-10-CM | POA: Diagnosis not present

## 2020-12-27 DIAGNOSIS — J9621 Acute and chronic respiratory failure with hypoxia: Secondary | ICD-10-CM | POA: Diagnosis present

## 2020-12-27 LAB — BASIC METABOLIC PANEL
Anion gap: 10 (ref 5–15)
BUN: 16 mg/dL (ref 6–20)
CO2: 21 mmol/L — ABNORMAL LOW (ref 22–32)
Calcium: 9.4 mg/dL (ref 8.9–10.3)
Chloride: 105 mmol/L (ref 98–111)
Creatinine, Ser: 1.04 mg/dL (ref 0.61–1.24)
GFR, Estimated: >60 mL/min (ref >60)
Glucose, Bld: 161 mg/dL — ABNORMAL HIGH (ref 70–99)
Potassium: 4 mmol/L (ref 3.5–5.1)
Sodium: 136 mmol/L (ref 135–145)

## 2020-12-27 LAB — CBC
HCT: 45.2 % (ref 39.0–52.0)
Hemoglobin: 14.4 g/dL (ref 13.0–17.0)
MCH: 29.1 pg (ref 26.0–34.0)
MCHC: 31.9 g/dL (ref 30.0–36.0)
MCV: 91.5 fL (ref 80.0–100.0)
Platelets: 251 10*3/uL (ref 150–400)
RBC: 4.94 MIL/uL (ref 4.22–5.81)
RDW: 13.9 % (ref 11.5–15.5)
WBC: 13.8 10*3/uL — ABNORMAL HIGH (ref 4.0–10.5)
nRBC: 0 % (ref 0.0–0.2)

## 2020-12-27 LAB — PROCALCITONIN: Procalcitonin: <0.1 ng/mL

## 2020-12-27 LAB — STREP PNEUMONIAE URINARY ANTIGEN: Strep Pneumo Urinary Antigen: NEGATIVE

## 2020-12-27 NOTE — Progress Notes (Signed)
No bipap needed at this time.  Pt VS stable.

## 2020-12-27 NOTE — Progress Notes (Signed)
PROGRESS NOTE    [name]  WUJ:[id number] DOB: [birth year] DOA: 12/26/2020 PCP: [name], MD   Brief Narrative:  HPI: [name] is a 60 y.o. male with medical history significant of COPD; HTN; and chronic systolic CHF presenting with SOB.  He reports 4 days of SOB, which started after he painted epoxy on a house.  He could sense it was coming on and has been using his inhalers/nebs without improvement.  +nonproductive cough.  +constant wheezing/SOB.  Continues to smoke 1 ppd.  He has been on BIPAP in the past.  BIPAP was started and he did report improved symptoms.   ED Course: COPD, still smokes.  3-4 days worsening SOB.  CXR with ?infiltrate.  Given nebs, steroids, antibiotics.  90s on room air.   Giving neb and Mag++ now.    Assessment & Plan:   Principal Problem:   COPD with acute exacerbation ([place]) Active Problems:   Cigarette smoker   HTN (hypertension)   Acute exacerbation of chronic obstructive pulmonary disease (COPD) ([place])  Acute hypoxic respiratory failure secondary to acute COPD exacerbation and possible community-acquired pneumonia: Feels better than yesterday.  Minimal end expiratory wheezes.  Continue prednisone, steroids and cefepime for now.Chest x-ray shows possible left base infiltrate.  He is afebrile.  Unsure whether this is real pneumonia.  Check urine antigen for streptococci, Legionella and procalcitonin.  Requiring 2 to 3 L oxygen.  Does not use any home oxygen.  We will try to wean oxygen.  Essential hypertension: Controlled.  Continue Norvasc and Bystolic.  Chronic systolic congestive heart failure is documented in H&P however patient's echo done in June 2022 and February 2017 showed normal ejection fraction so there is no evidence of systolic congestive heart failure.  Ongoing tobacco dependence: Cessation counseling provided.  Patient is very much interested in that.  DVT prophylaxis:    Code Status: Full Code  Family  Communication:  None present at bedside.  Plan of care discussed with patient in length and he verbalized understanding and agreed with it.  Status is: Inpatient  Remains inpatient appropriate because:Inpatient level of care appropriate due to severity of illness  Dispo: The patient is from: Home              Anticipated d/c is to: Home              Patient currently is not medically stable to d/c.   Difficult to place patient No   Estimated body mass index is 34.3 kg/m as calculated from the following:   Height as of this encounter: 6\' 1"  (1.854 m).   Weight as of this encounter: 117.9 kg.  Nutritional Assessment: Body mass index is 34.3 kg/m.Marland Kitchen Seen by dietician.  I agree with the assessment and plan as outlined below: Nutrition Status:  Skin Assessment: I have examined the patient's skin and I agree with the wound assessment as performed by the wound care RN as outlined below:    Consultants:  None  Procedures:  None  Antimicrobials:  Anti-infectives (From admission, onward)    Start     Dose/Rate Route Frequency Ordered Stop   12/26/20 1400  ceFEPIme (MAXIPIME) 2 g in sodium chloride 0.9 % 100 mL IVPB        2 g 200 mL/hr over 30 Minutes Intravenous Every 8 hours 12/26/20 1321 12/31/20 1359   12/26/20 1130  cefTRIAXone (ROCEPHIN) 1 g in sodium chloride 0.9 % 100 mL IVPB  1 g 200 mL/hr over 30 Minutes Intravenous  Once 12/26/20 1122 12/26/20 1246   12/26/20 1130  azithromycin (ZITHROMAX) 500 mg in sodium chloride 0.9 % 250 mL IVPB        500 mg 250 mL/hr over 60 Minutes Intravenous  Once 12/26/20 1122 12/26/20 1445          Subjective: Patient seen and examined.  He states that his breathing is better than yesterday.  In fact he is wanting to go home but he is still hypoxic requiring oxygen and in fact having some dyspnea while talking to me.  After counseling, he is willing to stay overnight.  Objective: Vitals:   12/27/20 0645 12/27/20 0745 12/27/20  0856 12/27/20 1045  BP: (!) 104/49 (!) 111/44  (!) 126/59  Pulse: (!) 59 69  61  Resp: 13 20  13   Temp:      TempSrc:      SpO2: 97% 96% 96% 97%  Weight:      Height:        Intake/Output Summary (Last 24 hours) at 12/27/2020 1252 Last data filed at 12/27/2020 0744 Gross per 24 hour  Intake 350 ml  Output 400 ml  Net -50 ml   Filed Weights   12/26/20 1011  Weight: 117.9 kg    Examination:  General exam: Appears calm and comfortable  Respiratory system: End expiratory wheezes with rhonchi at the Left base.Respiratory effort normal. Cardiovascular system: S1 & S2 heard, RRR. No JVD, murmurs, rubs, gallops or clicks. No pedal edema. Gastrointestinal system: Abdomen is nondistended, soft and nontender. No organomegaly or masses felt. Normal bowel sounds heard. Central nervous system: Alert and oriented. No focal neurological deficits. Extremities: Symmetric 5 x 5 power. Skin: No rashes, lesions or ulcers Psychiatry: Judgement and insight appear normal. Mood & affect appropriate.    Data Reviewed: I have personally reviewed following labs and imaging studies  CBC: Recent Labs  Lab 12/26/20 1021 12/27/20 0936  WBC 6.0 13.8*  NEUTROABS 2.5  --   HGB 15.9 14.4  HCT 49.1 45.2  MCV 89.8 91.5  PLT 235 885   Basic Metabolic Panel: Recent Labs  Lab 12/26/20 1021 12/27/20 0936  NA 140 136  K 4.3 4.0  CL 106 105  CO2 26 21*  GLUCOSE 106* 161*  BUN 7 16  CREATININE 0.91 1.04  CALCIUM 9.3 9.4   GFR: Estimated Creatinine Clearance: 101.6 mL/min (by C-G formula based on SCr of 1.04 mg/dL). Liver Function Tests: No results for input(s): AST, ALT, ALKPHOS, BILITOT, PROT, ALBUMIN in the last 168 hours. No results for input(s): LIPASE, AMYLASE in the last 168 hours. No results for input(s): AMMONIA in the last 168 hours. Coagulation Profile: No results for input(s): INR, PROTIME in the last 168 hours. Cardiac Enzymes: No results for input(s): CKTOTAL, CKMB, CKMBINDEX,  TROPONINI in the last 168 hours. BNP (last 3 results) No results for input(s): PROBNP in the last 8760 hours. HbA1C: No results for input(s): HGBA1C in the last 72 hours. CBG: No results for input(s): GLUCAP in the last 168 hours. Lipid Profile: No results for input(s): CHOL, HDL, LDLCALC, TRIG, CHOLHDL, LDLDIRECT in the last 72 hours. Thyroid Function Tests: No results for input(s): TSH, T4TOTAL, FREET4, T3FREE, THYROIDAB in the last 72 hours. Anemia Panel: No results for input(s): VITAMINB12, FOLATE, FERRITIN, TIBC, IRON, RETICCTPCT in the last 72 hours. Sepsis Labs: Recent Labs  Lab 12/27/20 0936  PROCALCITON <0.10    Recent Results (from the past 240  hour(s))  Resp Panel by RT-PCR (Flu A&B, Covid) Nasopharyngeal Swab     Status: None   Collection Time: 12/26/20 10:21 AM   Specimen: Nasopharyngeal Swab; Nasopharyngeal(NP) swabs in vial transport medium  Result Value Ref Range Status   SARS Coronavirus 2 by RT PCR NEGATIVE NEGATIVE Final    Comment: (NOTE) SARS-CoV-2 target nucleic acids are NOT DETECTED.  The SARS-CoV-2 RNA is generally detectable in upper respiratory specimens during the acute phase of infection. The lowest concentration of SARS-CoV-2 viral copies this assay can detect is 138 copies/mL. A negative result does not preclude SARS-Cov-2 infection and should not be used as the sole basis for treatment or other patient management decisions. A negative result may occur with  improper specimen collection/handling, submission of specimen other than nasopharyngeal swab, presence of viral mutation(s) within the areas targeted by this assay, and inadequate number of viral copies(<138 copies/mL). A negative result must be combined with clinical observations, patient history, and epidemiological information. The expected result is Negative.  Fact Sheet for Patients:  EntrepreneurPulse.com.au  Fact Sheet for Healthcare Providers:   IncredibleEmployment.be  This test is no t yet approved or cleared by the [place] FDA and  has been authorized for detection and/or diagnosis of SARS-CoV-2 by FDA under an Emergency Use Authorization (EUA). This EUA will remain  in effect (meaning this test can be used) for the duration of the COVID-19 declaration under Section 564(b)(1) of the Act, 21 U.S.C.section 360bbb-3(b)(1), unless the authorization is terminated  or revoked sooner.       Influenza A by PCR NEGATIVE NEGATIVE Final   Influenza B by PCR NEGATIVE NEGATIVE Final    Comment: (NOTE) The Xpert Xpress SARS-CoV-2/FLU/RSV plus assay is intended as an aid in the diagnosis of influenza from Nasopharyngeal swab specimens and should not be used as a sole basis for treatment. Nasal washings and aspirates are unacceptable for Xpert Xpress SARS-CoV-2/FLU/RSV testing.  Fact Sheet for Patients: EntrepreneurPulse.com.au  Fact Sheet for Healthcare Providers: IncredibleEmployment.be  This test is not yet approved or cleared by the [place] FDA and has been authorized for detection and/or diagnosis of SARS-CoV-2 by FDA under an Emergency Use Authorization (EUA). This EUA will remain in effect (meaning this test can be used) for the duration of the COVID-19 declaration under Section 564(b)(1) of the Act, 21 U.S.C. section 360bbb-3(b)(1), unless the authorization is terminated or revoked.  Performed at [facility], [place] [address], [place], [place] [address]       Radiology Studies: DG Chest Port 1 View  Result Date: 12/26/2020 CLINICAL DATA:  Shortness of breath. Chest pain and cough over the last 4 days. EXAM: PORTABLE CHEST 1 VIEW COMPARISON:  08/31/2020 FINDINGS: Lung bases are not completely included. Heart size is normal. Mediastinal shadows are normal. Small amount of fluid in the fissure or linear scarring on the right. I think the right lung  base is clear. Question patchy infiltrate at the left base. Consider two-view chest radiography when able. IMPRESSION: Poor penetration at the lung bases. Question patchy infiltrate at the left base. Consider two-view chest radiography when able. Electronically Signed   By: Nelson Chimes M.D.   On: 12/26/2020 11:14    Scheduled Meds:  amLODipine  10 mg Oral Daily   aspirin  81 mg Oral Daily   chlorthalidone  25 mg Oral Daily   docusate sodium  100 mg Oral BID   enoxaparin (LOVENOX) injection  0.5 mg/kg Subcutaneous Q24H   ipratropium-albuterol  3 mL Nebulization Q6H   isosorbide mononitrate  30 mg Oral Daily   mometasone-formoterol  2 puff Inhalation BID   nebivolol  2.5 mg Oral Daily   nicotine  14 mg Transdermal Q24H   predniSONE  40 mg Oral Q breakfast   pregabalin  50 mg Oral BID   sodium chloride flush  3 mL Intravenous Q12H   Continuous Infusions:  ceFEPime (MAXIPIME) IV Stopped (12/27/20 0658)     LOS: 0 days   Time spent: 35 minutes   Darliss Cheney, MD [facility]  12/27/2020, 12:52 PM  Please page via [place] and do not message via secure chat for anything urgent. Secure chat can be used for anything non urgent.  How to contact the [facility] Attending or Consulting provider [place] or covering provider during after hours [place], for this patient?  Check the care team in [facility] and look for a) attending/consulting [facility] provider listed and b) the [facility] team listed. Page or secure chat [phone]. Log into [email or web address] and use [place]'s universal password to access. If you do not have the password, please contact the hospital operator. Locate the [facility] provider you are looking for under [facility] and page to a number that you can be directly reached. If you still have difficulty reaching the provider, please page the [facility] (Director on Call) for the Hospitalists listed on amion for assistance.

## 2020-12-27 NOTE — Plan of Care (Signed)

## 2020-12-27 NOTE — Evaluation (Addendum)
Physical Therapy Evaluation and Discharge Patient Details Name: [name] MRN: [id number] DOB: [birth year] Today's Date: 12/27/2020  History of Present Illness  60 yo male with onset of SOB and resp distress after using  epoxy on a house was admitted 10/3.  Had no relief from inhalers and breathing tx, states he could feel the issue developing at work.  PMHx:  smoker, HTN, CHF, cardiomyopathy, COPD,  Clinical Impression  Pt was seen for evaluation of mobility and his tolerance on room air, which apparently nursing had already determined was not needed.  Removed cannula and noted his maintenance of sats in 90% range, but did drop to 92% from 99%.  After walk on room air was 95%, and was not SOB with any aspects of exertion.  Pt was then assisted to set up lines for telemetry, noted no strength deficits nor any coordination changes.  Recommend PT discharge due to pt demonstrating ability to walk and manage on room air at PLOF.       Recommendations for follow up therapy are one component of a multi-disciplinary discharge planning process, led by the attending physician.  Recommendations may be updated based on patient status, additional functional criteria and insurance authorization.  Follow Up Recommendations No PT follow up    Equipment Recommendations  None recommended by PT    Recommendations for Other Services       Precautions / Restrictions Precautions Precautions: None Precaution Comments: monitor O2 sats Restrictions Weight Bearing Restrictions: No      Mobility  Bed Mobility Overal bed mobility: Independent             General bed mobility comments: able to roll and sit up despite lines    Transfers Overall transfer level: Needs assistance Equipment used: None Transfers: Sit to/from Stand Sit to Stand: Supervision (due to lines)         General transfer comment: no help needed, monitored O2 sats  Ambulation/Gait Ambulation/Gait assistance:  Supervision (on room air) Gait Distance (Feet): 150 Feet Assistive device: None Gait Pattern/deviations: Step-through pattern;Wide base of support;Decreased stride length Gait velocity: reduced   General Gait Details: pt is on the hallway wiht no AD, monitored his O2 sats with no SOB generated, maintained in 90's  Stairs            Wheelchair Mobility    Modified Rankin (Stroke Patients Only)       Balance Overall balance assessment: Modified Independent                                           Pertinent Vitals/Pain Pain Assessment: No/denies pain    Home Living Family/patient expects to be discharged to:: Private residence Living Arrangements: Children Available Help at Discharge: Family;Available 24 hours/day Type of Home: House Home Access: Level entry Entrance Stairs-Rails: None   Home Layout: One level Home Equipment: None      Prior Function Level of Independence: Independent         Comments: works on Architect tasks, driving and working full time     Journalist, newspaper   Dominant Hand: Right    Extremity/Trunk Assessment   Upper Extremity Assessment Upper Extremity Assessment: Overall WFL for tasks assessed    Lower Extremity Assessment Lower Extremity Assessment: Overall WFL for tasks assessed    Cervical / Trunk Assessment Cervical / Trunk Assessment: Normal  Communication   Communication:  No difficulties (R ear is foggy)  Cognition Arousal/Alertness: Awake/alert Behavior During Therapy: WFL for tasks assessed/performed Overall Cognitive Status: Within Functional Limits for tasks assessed                                        General Comments General comments (skin integrity, edema, etc.): Pt was on O2 via cannula when PT arrived, and took him off to see how room air went.  Dropped initially then steadied at 92%.  Pt was up to 95% by end of walk    Exercises     Assessment/Plan    PT  Assessment Patent does not need any further PT services  PT Problem List         PT Treatment Interventions      PT Goals (Current goals can be found in the Care Plan section)  Acute Rehab PT Goals Patient Stated Goal: to go home PT Goal Formulation: All assessment and education complete, DC therapy    Frequency     Barriers to discharge        Co-evaluation               AM-PAC PT "6 Clicks" Mobility  Outcome Measure Help needed turning from your back to your side while in a flat bed without using bedrails?: None Help needed moving from lying on your back to sitting on the side of a flat bed without using bedrails?: None Help needed moving to and from a bed to a chair (including a wheelchair)?: None Help needed standing up from a chair using your arms (e.g., wheelchair or bedside chair)?: None Help needed to walk in hospital room?: None Help needed climbing 3-5 steps with a railing? : A Little 6 Click Score: 23    End of Session Equipment Utilized During Treatment: Gait belt;Oxygen Activity Tolerance: Patient tolerated treatment well;Other (comment) (Does not require O2) Patient left: in bed;with call bell/phone within reach Nurse Communication: Mobility status;Other (comment) (abiliity to control sats without cannula) PT Visit Diagnosis: Other abnormalities of gait and mobility (R26.89)    Time: 3845-3646 PT Time Calculation (min) (ACUTE ONLY): 32 min   Charges:   PT Evaluation $PT Eval Moderate Complexity: 1 Mod PT Treatments $Gait Training: 8-22 mins       Ramond Dial 12/27/2020, 1:37 PM  Mee Hives, PT PhD Acute Rehab Dept. Number: [place] [facility] [place]

## 2020-12-27 NOTE — ED Notes (Signed)
Report given to Robin Searing, RN

## 2020-12-28 LAB — BASIC METABOLIC PANEL
Anion gap: 8 (ref 5–15)
BUN: 17 mg/dL (ref 6–20)
CO2: 21 mmol/L — ABNORMAL LOW (ref 22–32)
Calcium: 9.1 mg/dL (ref 8.9–10.3)
Chloride: 104 mmol/L (ref 98–111)
Creatinine, Ser: 0.91 mg/dL (ref 0.61–1.24)
GFR, Estimated: >60 mL/min (ref >60)
Glucose, Bld: 135 mg/dL — ABNORMAL HIGH (ref 70–99)
Potassium: 4.3 mmol/L (ref 3.5–5.1)
Sodium: 133 mmol/L — ABNORMAL LOW (ref 135–145)

## 2020-12-28 LAB — CBC WITH DIFFERENTIAL/PLATELET
Abs Immature Granulocytes: 0.05 10*3/uL (ref 0.00–0.07)
Basophils Absolute: 0 10*3/uL (ref 0.0–0.1)
Basophils Relative: 0 %
Eosinophils Absolute: 0 10*3/uL (ref 0.0–0.5)
Eosinophils Relative: 0 %
HCT: 43 % (ref 39.0–52.0)
Hemoglobin: 13.9 g/dL (ref 13.0–17.0)
Immature Granulocytes: 0 %
Lymphocytes Relative: 13 %
Lymphs Abs: 1.9 10*3/uL (ref 0.7–4.0)
MCH: 28.5 pg (ref 26.0–34.0)
MCHC: 32.3 g/dL (ref 30.0–36.0)
MCV: 88.3 fL (ref 80.0–100.0)
Monocytes Absolute: 0.7 10*3/uL (ref 0.1–1.0)
Monocytes Relative: 4 %
Neutro Abs: 12.3 10*3/uL — ABNORMAL HIGH (ref 1.7–7.7)
Neutrophils Relative %: 83 %
Platelets: 237 10*3/uL (ref 150–400)
RBC: 4.87 MIL/uL (ref 4.22–5.81)
RDW: 13.7 % (ref 11.5–15.5)
WBC: 15 10*3/uL — ABNORMAL HIGH (ref 4.0–10.5)
nRBC: 0 % (ref 0.0–0.2)

## 2020-12-28 LAB — LEGIONELLA PNEUMOPHILA SEROGP 1 UR AG: L. pneumophila Serogp 1 Ur Ag: NEGATIVE

## 2020-12-28 MED ORDER — DOXYCYCLINE HYCLATE 100 MG PO CAPS: 100.0000 mg | ORAL_CAPSULE | Freq: Two times a day (BID) | ORAL | 0 refills | Status: AC

## 2020-12-28 MED ORDER — INFLUENZA VAC SPLIT QUAD 0.5 ML IM SUSY
0.5000 mL | PREFILLED_SYRINGE | INTRAMUSCULAR | Status: AC
  Administered 2020-12-28: 0.5 mL via INTRAMUSCULAR
  Filled 2020-12-28: qty 0.5

## 2020-12-28 MED ORDER — PREDNISONE 50 MG PO TABS: 50.0000 mg | ORAL_TABLET | Freq: Every day | ORAL | 0 refills | Status: AC

## 2020-12-28 NOTE — Discharge Summary (Addendum)
Physician Discharge Summary  [name] FAO:[id number] DOB: [birth year] DOA: 12/26/2020  PCP: [name], MD  Admit date: 12/26/2020 Discharge date: 12/28/2020 30 Day Unplanned Readmission Risk Score    Flowsheet Row ED to Hosp-Admission (Current) from 12/26/2020 in [facility]  30 Day Unplanned Readmission Risk Score (%) 14.62 Filed at 12/28/2020 0801       This score is the patient's risk of an unplanned readmission within 30 days of being discharged (0 -100%). The score is based on dignosis, age, lab data, medications, orders, and past utilization.   Low:  0-14.9   Medium: 15-21.9   High: 22-29.9   Extreme: 30 and above          Admitted From: Home Disposition: Home  Recommendations for Outpatient Follow-up:  Follow up with PCP in 1-2 weeks Please obtain BMP/CBC in one week Follow-up with pulmonology to establish care and have outpatient sleep study Please follow up with your PCP on the following pending results: Unresulted Labs (From admission, onward)     Start     Ordered   12/27/20 0826  Legionella Pneumophila Serogp 1 Ur Ag  Once,   R        12/27/20 0825   12/26/20 1320  Expectorated Sputum Assessment w Gram Stain, Rflx to Resp Cult  (COPD / Pneumonia / Cellulitis / Lower Extremity Wound)  Once,   R        12/26/20 1321              Home Health: None Equipment/Devices: None  Discharge Condition: Stable CODE STATUS: Full code Diet recommendation: Cardiac  Subjective: Seen and examined.  Feels much better.  No shortness of breath.  Ready to go home.  Brief/Interim Summary: [name] is a 60 y.o. male with medical history significant of COPD; HTN; and chronic systolic CHF presented with SOB of 4 days duration, he was diagnosed with acute hypoxic respiratory failure secondary to acute COPD exacerbation in the ED with possible infiltrates on the chest x-ray.  Initially he required BiPAP.  Soon he was weaned down to  nasal cannula oxygen.  He was admitted to hospitalist service.  He was continued on Solu-Medrol, bronchodilators as well as antibiotics for possible pneumonia.  Procalcitonin was later checked which was negative so pneumonia was ruled out.  Patient improved significantly, he was weaned to room air.  Ambulatory oximetry showed he was saturating well above 95% on room air not requiring any oxygen.  He is feeling much better as well.  No more wheezes on examination.  He is being discharged in stable condition.  Patient is very well committed to quit smoking.  He believes that he has sleep apnea and was requesting sleep study.  He has been advised to get a referral from his PCP to see pulmonary to establish care for his COPD as well as obtain outpatient sleep study.  He verbalized understanding.  Discharge Diagnoses:  Principal Problem:   COPD with acute exacerbation ([facility]) Active Problems:   Cigarette smoker   HTN (hypertension)   Acute respiratory failure with hypoxia ([facility])   Acute exacerbation of chronic obstructive pulmonary disease (COPD) ([place])    Discharge Instructions   Allergies as of 12/28/2020   No Known Allergies      Medication List     STOP taking these medications    aspirin 81 MG chewable tablet       TAKE these medications    albuterol 108 (90  Base) MCG/ACT inhaler Commonly known as: VENTOLIN HFA Inhale 2 puffs into the lungs every 4 (four) hours as needed for wheezing or shortness of breath.   amLODipine 10 MG tablet Commonly known as: NORVASC Take 1 tablet (10 mg total) by mouth daily.   doxycycline 100 MG capsule Commonly known as: VIBRAMYCIN Take 1 capsule (100 mg total) by mouth 2 (two) times daily for 4 days.   ipratropium-albuterol 0.5-2.5 (3) MG/3ML Soln Commonly known as: DUONEB Take 3 mLs by nebulization 3 (three) times daily. What changed:  when to take this reasons to take this   isosorbide mononitrate 30 MG 24 hr tablet Commonly known as:  IMDUR Take 1 tablet (30 mg total) by mouth daily.   nebivolol 2.5 MG tablet Commonly known as: BYSTOLIC Take 1 tablet (2.5 mg total) by mouth daily.   nicotine 14 mg/24hr patch Commonly known as: NICODERM CQ - dosed in mg/24 hours Place 1 patch (14 mg total) onto the skin daily.   oxyCODONE 15 MG immediate release tablet Commonly known as: Roxicodone Take 1 tablet (15 mg total) by mouth every 8 (eight) hours as needed.   predniSONE 50 MG tablet Commonly known as: DELTASONE Take 1 tablet (50 mg total) by mouth daily with breakfast for 3 days. Start taking on: December 29, 2020 What changed:  medication strength how much to take how to take this when to take this additional instructions   pregabalin 50 MG capsule Commonly known as: Lyrica Take 1 capsule (50 mg total) by mouth 2 (two) times daily.   sildenafil 100 MG tablet Commonly known as: VIAGRA Take 100 mg by mouth as needed for erectile dysfunction.   Symbicort 160-4.5 MCG/ACT inhaler Generic drug: budesonide-formoterol Inhale 2 puffs into the lungs 2 (two) times daily.        Follow-up Information     Pavelock, Ralene Bathe, MD Follow up in 1 week(s).   Specialty: Internal Medicine Contact information: [address] [place] [address] [phone]                No Known Allergies  Consultations: None   Procedures/Studies: DG Chest Port 1 View  Result Date: 12/26/2020 CLINICAL DATA:  Shortness of breath. Chest pain and cough over the last 4 days. EXAM: PORTABLE CHEST 1 VIEW COMPARISON:  08/31/2020 FINDINGS: Lung bases are not completely included. Heart size is normal. Mediastinal shadows are normal. Small amount of fluid in the fissure or linear scarring on the right. I think the right lung base is clear. Question patchy infiltrate at the left base. Consider two-view chest radiography when able. IMPRESSION: Poor penetration at the lung bases. Question patchy infiltrate at the left  base. Consider two-view chest radiography when able. Electronically Signed   By: Nelson Chimes M.D.   On: 12/26/2020 11:14     Discharge Exam: Vitals:   12/28/20 0746 12/28/20 0900  BP:  (!) 144/87  Pulse: 67 67  Resp: 18   Temp:    SpO2: 93% 98%   Vitals:   12/28/20 0252 12/28/20 0350 12/28/20 0746 12/28/20 0900  BP:  135/77  (!) 144/87  Pulse:  63 67 67  Resp:  20 18   Temp:  98 F (36.7 C)    TempSrc:  Oral    SpO2: 93% 96% 93% 98%  Weight:      Height:        General: Pt is alert, awake, not in acute distress Cardiovascular: RRR, S1/S2 +, no  rubs, no gallops Respiratory: CTA bilaterally, no wheezing, no rhonchi Abdominal: Soft, NT, ND, bowel sounds + Extremities: no edema, no cyanosis    The results of significant diagnostics from this hospitalization (including imaging, microbiology, ancillary and laboratory) are listed below for reference.     Microbiology: Recent Results (from the past 240 hour(s))  Resp Panel by RT-PCR (Flu A&B, Covid) Nasopharyngeal Swab     Status: None   Collection Time: 12/26/20 10:21 AM   Specimen: Nasopharyngeal Swab; Nasopharyngeal(NP) swabs in vial transport medium  Result Value Ref Range Status   SARS Coronavirus 2 by RT PCR NEGATIVE NEGATIVE Final    Comment: (NOTE) SARS-CoV-2 target nucleic acids are NOT DETECTED.  The SARS-CoV-2 RNA is generally detectable in upper respiratory specimens during the acute phase of infection. The lowest concentration of SARS-CoV-2 viral copies this assay can detect is 138 copies/mL. A negative result does not preclude SARS-Cov-2 infection and should not be used as the sole basis for treatment or other patient management decisions. A negative result may occur with  improper specimen collection/handling, submission of specimen other than nasopharyngeal swab, presence of viral mutation(s) within the areas targeted by this assay, and inadequate number of viral copies(<138 copies/mL). A negative  result must be combined with clinical observations, patient history, and epidemiological information. The expected result is Negative.  Fact Sheet for Patients:  EntrepreneurPulse.com.au  Fact Sheet for Healthcare Providers:  IncredibleEmployment.be  This test is no t yet approved or cleared by the [place] FDA and  has been authorized for detection and/or diagnosis of SARS-CoV-2 by FDA under an Emergency Use Authorization (EUA). This EUA will remain  in effect (meaning this test can be used) for the duration of the COVID-19 declaration under Section 564(b)(1) of the Act, 21 U.S.C.section 360bbb-3(b)(1), unless the authorization is terminated  or revoked sooner.       Influenza A by PCR NEGATIVE NEGATIVE Final   Influenza B by PCR NEGATIVE NEGATIVE Final    Comment: (NOTE) The Xpert Xpress SARS-CoV-2/FLU/RSV plus assay is intended as an aid in the diagnosis of influenza from Nasopharyngeal swab specimens and should not be used as a sole basis for treatment. Nasal washings and aspirates are unacceptable for Xpert Xpress SARS-CoV-2/FLU/RSV testing.  Fact Sheet for Patients: EntrepreneurPulse.com.au  Fact Sheet for Healthcare Providers: IncredibleEmployment.be  This test is not yet approved or cleared by the [place] FDA and has been authorized for detection and/or diagnosis of SARS-CoV-2 by FDA under an Emergency Use Authorization (EUA). This EUA will remain in effect (meaning this test can be used) for the duration of the COVID-19 declaration under Section 564(b)(1) of the Act, 21 U.S.C. section 360bbb-3(b)(1), unless the authorization is terminated or revoked.  Performed at [facility], [place] [address] [place], [place] [address]      Labs: BNP (last 3 results) Recent Labs    08/30/20 0749 08/31/20 0339 12/26/20 1021  BNP 34.9 48.5 18.2   Basic Metabolic Panel: Recent Labs   Lab 12/26/20 1021 12/27/20 0936 12/28/20 0147  NA 140 136 133*  K 4.3 4.0 4.3  CL 106 105 104  CO2 26 21* 21*  GLUCOSE 106* 161* 135*  BUN 7 16 17   CREATININE 0.91 1.04 0.91  CALCIUM 9.3 9.4 9.1   Liver Function Tests: No results for input(s): AST, ALT, ALKPHOS, BILITOT, PROT, ALBUMIN in the last 168 hours. No results for input(s): LIPASE, AMYLASE in the last 168 hours. No results for input(s): AMMONIA in the  last 168 hours. CBC: Recent Labs  Lab 12/26/20 1021 12/27/20 0936 12/28/20 0147  WBC 6.0 13.8* 15.0*  NEUTROABS 2.5  --  12.3*  HGB 15.9 14.4 13.9  HCT 49.1 45.2 43.0  MCV 89.8 91.5 88.3  PLT 235 251 237   Cardiac Enzymes: No results for input(s): CKTOTAL, CKMB, CKMBINDEX, TROPONINI in the last 168 hours. BNP: Invalid input(s): POCBNP CBG: No results for input(s): GLUCAP in the last 168 hours. D-Dimer No results for input(s): DDIMER in the last 72 hours. Hgb A1c No results for input(s): HGBA1C in the last 72 hours. Lipid Profile No results for input(s): CHOL, HDL, LDLCALC, TRIG, CHOLHDL, LDLDIRECT in the last 72 hours. Thyroid function studies No results for input(s): TSH, T4TOTAL, T3FREE, THYROIDAB in the last 72 hours.  Invalid input(s): FREET3 Anemia work up No results for input(s): VITAMINB12, FOLATE, FERRITIN, TIBC, IRON, RETICCTPCT in the last 72 hours. Urinalysis    Component Value Date/Time   COLORURINE COLORLESS (A) 11/05/2016 2314   APPEARANCEUR CLEAR 11/05/2016 2314   LABSPEC 1.000 (L) 11/05/2016 2314   PHURINE 7.0 11/05/2016 2314   GLUCOSEU NEGATIVE 11/05/2016 2314   HGBUR NEGATIVE 11/05/2016 2314   BILIRUBINUR NEGATIVE 11/05/2016 2314   KETONESUR NEGATIVE 11/05/2016 2314   PROTEINUR NEGATIVE 11/05/2016 2314   UROBILINOGEN 1.0 07/21/2014 1514   NITRITE NEGATIVE 11/05/2016 2314   LEUKOCYTESUR NEGATIVE 11/05/2016 2314   Sepsis Labs Invalid input(s): PROCALCITONIN,  WBC,  LACTICIDVEN Microbiology Recent Results (from the past 240  hour(s))  Resp Panel by RT-PCR (Flu A&B, Covid) Nasopharyngeal Swab     Status: None   Collection Time: 12/26/20 10:21 AM   Specimen: Nasopharyngeal Swab; Nasopharyngeal(NP) swabs in vial transport medium  Result Value Ref Range Status   SARS Coronavirus 2 by RT PCR NEGATIVE NEGATIVE Final    Comment: (NOTE) SARS-CoV-2 target nucleic acids are NOT DETECTED.  The SARS-CoV-2 RNA is generally detectable in upper respiratory specimens during the acute phase of infection. The lowest concentration of SARS-CoV-2 viral copies this assay can detect is 138 copies/mL. A negative result does not preclude SARS-Cov-2 infection and should not be used as the sole basis for treatment or other patient management decisions. A negative result may occur with  improper specimen collection/handling, submission of specimen other than nasopharyngeal swab, presence of viral mutation(s) within the areas targeted by this assay, and inadequate number of viral copies(<138 copies/mL). A negative result must be combined with clinical observations, patient history, and epidemiological information. The expected result is Negative.  Fact Sheet for Patients:  EntrepreneurPulse.com.au  Fact Sheet for Healthcare Providers:  IncredibleEmployment.be  This test is no t yet approved or cleared by the [place] FDA and  has been authorized for detection and/or diagnosis of SARS-CoV-2 by FDA under an Emergency Use Authorization (EUA). This EUA will remain  in effect (meaning this test can be used) for the duration of the COVID-19 declaration under Section 564(b)(1) of the Act, 21 U.S.C.section 360bbb-3(b)(1), unless the authorization is terminated  or revoked sooner.       Influenza A by PCR NEGATIVE NEGATIVE Final   Influenza B by PCR NEGATIVE NEGATIVE Final    Comment: (NOTE) The Xpert Xpress SARS-CoV-2/FLU/RSV plus assay is intended as an aid in the diagnosis of influenza from  Nasopharyngeal swab specimens and should not be used as a sole basis for treatment. Nasal washings and aspirates are unacceptable for Xpert Xpress SARS-CoV-2/FLU/RSV testing.  Fact Sheet for Patients: EntrepreneurPulse.com.au  Fact Sheet for Healthcare Providers: IncredibleEmployment.be  This test is not yet approved or cleared by the [place] and has been authorized for detection and/or diagnosis of SARS-CoV-2 by FDA under an Emergency Use Authorization (EUA). This EUA will remain in effect (meaning this test can be used) for the duration of the COVID-19 declaration under Section 564(b)(1) of the Act, 21 U.S.C. section 360bbb-3(b)(1), unless the authorization is terminated or revoked.  Performed at [facility], [place] [address] [place], [place] [address]      Time coordinating discharge: Over 30 minutes  SIGNED:   Darliss Cheney, MD  [facility] 12/28/2020, 10:25 AM  If 7PM-7AM, please contact night-coverage [email or web address]

## 2020-12-28 NOTE — Progress Notes (Addendum)
Nursing discharge note  Patient alert and oriented. Verbalized understanding of instructions. Ccmd aware of dc. Piv dcd. Site unremarkable. Belongings and dc papers given to patient. Denies chest pain or shortness of breath.patient also given his flu shot prior to dc.  Patient transferred to [employer] with [place], NT while waiting for his ride home

## 2020-12-28 NOTE — Progress Notes (Signed)
SATURATION QUALIFICATIONS  Patient Saturations on Room Air at Rest = 97%  Patient Saturations on Room Air while Ambulating = 98%  Patient Saturations on  Room Air while Ambulating = 98%  Pt does not require oxygen to ambulate. Tolerated well.

## 2020-12-30 ENCOUNTER — Other Ambulatory Visit: Payer: Self-pay

## 2020-12-30 ENCOUNTER — Ambulatory Visit (INDEPENDENT_AMBULATORY_CARE_PROVIDER_SITE_OTHER): Payer: Medicare Other | Admitting: Podiatry

## 2020-12-30 DIAGNOSIS — G5762 Lesion of plantar nerve, left lower limb: Secondary | ICD-10-CM

## 2020-12-30 DIAGNOSIS — G8929 Other chronic pain: Secondary | ICD-10-CM

## 2020-12-30 DIAGNOSIS — G5782 Other specified mononeuropathies of left lower limb: Secondary | ICD-10-CM | POA: Diagnosis not present

## 2020-12-30 DIAGNOSIS — M79605 Pain in left leg: Secondary | ICD-10-CM

## 2020-12-30 MED ORDER — OXYCODONE HCL 15 MG PO TABS: 15.0000 mg | ORAL_TABLET | Freq: Three times a day (TID) | ORAL | 0 refills | Status: DC | PRN

## 2021-01-09 ENCOUNTER — Telehealth: Payer: Self-pay | Admitting: Podiatry

## 2021-01-09 ENCOUNTER — Telehealth: Payer: Self-pay | Admitting: *Deleted

## 2021-01-09 NOTE — Telephone Encounter (Signed)
Patient called office again requesting pain medication refill.

## 2021-01-09 NOTE — Telephone Encounter (Signed)
Patient called requesting pain medication   Please advise .Marland KitchenMarland KitchenMarland Kitchen

## 2021-01-09 NOTE — Telephone Encounter (Signed)
Patient is calling for a refill of his pain medicine.he is taking his last one today. Please advise.

## 2021-01-09 NOTE — Progress Notes (Signed)
  Subjective:  Patient ID: [name], male    DOB: August 29, 1960,  MRN: [id number]  Chief Complaint  Patient presents with   Foot Pain    Pt states profound pain. Medication helped but it only dulls the pain and pt states it is still extreme. Pain is such that it is debilitating in daily tasks - even including sleep.    DOS: 06/15/20 Procedure: Decompression/excision of neuroma left leg  60 y.o. male presents with the above complaint. History confirmed with patient.  Objective:  Physical Exam: mild tenderness at posterior leg area of previous surgeryl; absent sensation lateral leg sural distribution. Incision: healed  Assessment:   1. Sural neuritis, left   2. Chronic pain of left lower extremity     Plan:  Patient was evaluated and treated and all questions answered.  Sural Neuritis -Repeat injection -We discussed continued plan of management.  I think he would benefit from further injections as the neuroma area still rather scarred.  I refilled his pain medication and will send referral to pain management -Plan for repeat sural nerve injection if indicated next week. Could consider further excision of neuroma if pain persists.  Procedure: Neuroma Injection Location: Left sural nerve Skin Prep: Alcohol. Injectate: 0.5 cc 0.5% marcaine plain, 1 cc betamethasone acetate-betamethasone sodium phosphate Disposition: Patient tolerated procedure well. Injection site dressed with a band-aid.   No follow-ups on file.

## 2021-01-10 ENCOUNTER — Other Ambulatory Visit: Payer: Self-pay | Admitting: Podiatry

## 2021-01-10 DIAGNOSIS — G5782 Other specified mononeuropathies of left lower limb: Secondary | ICD-10-CM

## 2021-01-10 MED ORDER — OXYCODONE HCL 15 MG PO TABS: 15.0000 mg | ORAL_TABLET | Freq: Three times a day (TID) | ORAL | 0 refills | Status: DC | PRN

## 2021-01-10 NOTE — Progress Notes (Signed)
Refill for pain medicine sent to pharmacy. Will make sure patient follows-up with pain management

## 2021-01-10 NOTE — Telephone Encounter (Signed)
Patient called very upset because he called 2 times yesterday. He is in severe pain and its been hurting since early Monday morning. He asking if someone could send over pain meds asap.

## 2021-01-12 ENCOUNTER — Encounter: Payer: Self-pay | Admitting: Physical Medicine and Rehabilitation

## 2021-01-18 ENCOUNTER — Telehealth: Payer: Self-pay | Admitting: Podiatry

## 2021-01-18 MED ORDER — OXYCODONE HCL 15 MG PO TABS: 15.0000 mg | ORAL_TABLET | Freq: Three times a day (TID) | ORAL | 0 refills | Status: DC | PRN

## 2021-01-18 NOTE — Addendum Note (Signed)
Addended by: Hardie Pulley on: 01/18/2021 10:23 AM   Modules accepted: Orders

## 2021-01-18 NOTE — Telephone Encounter (Signed)
Patient got a call from pain management to schedule an appointment and they are unable to get him in for an appointment until January 2023. He is due for his pain medication tomorrow and would like to know what he can do in the mean time .  Please advise

## 2021-01-20 ENCOUNTER — Ambulatory Visit (INDEPENDENT_AMBULATORY_CARE_PROVIDER_SITE_OTHER): Payer: Medicare Other | Admitting: Podiatry

## 2021-01-20 DIAGNOSIS — Z91199 Patient's noncompliance with other medical treatment and regimen due to unspecified reason: Secondary | ICD-10-CM

## 2021-01-25 ENCOUNTER — Telehealth: Payer: Self-pay | Admitting: *Deleted

## 2021-01-25 NOTE — Telephone Encounter (Signed)
Patient is calling to request a pain medicine refill, last day of pills.Please advise.

## 2021-01-26 ENCOUNTER — Other Ambulatory Visit: Payer: Self-pay | Admitting: *Deleted

## 2021-01-26 ENCOUNTER — Encounter (HOSPITAL_COMMUNITY): Payer: Self-pay | Admitting: Radiology

## 2021-01-26 MED ORDER — OXYCODONE HCL 15 MG PO TABS: 15.0000 mg | ORAL_TABLET | Freq: Three times a day (TID) | ORAL | 0 refills | Status: DC | PRN

## 2021-01-26 NOTE — Telephone Encounter (Signed)
Returned call to patient and informed that medication has been sent to pharmacy on file.

## 2021-01-31 ENCOUNTER — Encounter
Payer: Medicare Other | Attending: Physical Medicine and Rehabilitation | Admitting: Physical Medicine and Rehabilitation

## 2021-02-02 ENCOUNTER — Telehealth (INDEPENDENT_AMBULATORY_CARE_PROVIDER_SITE_OTHER): Payer: Medicare Other | Admitting: *Deleted

## 2021-02-02 MED ORDER — OXYCODONE HCL 15 MG PO TABS: 15.0000 mg | ORAL_TABLET | Freq: Three times a day (TID) | ORAL | 0 refills | Status: DC | PRN

## 2021-02-02 NOTE — Telephone Encounter (Signed)
Patient is calling for a pain medicine refill, almost out. Please advise.

## 2021-02-02 NOTE — Telephone Encounter (Signed)
Patient has been notified

## 2021-02-07 ENCOUNTER — Ambulatory Visit (INDEPENDENT_AMBULATORY_CARE_PROVIDER_SITE_OTHER): Payer: Medicare Other | Admitting: Podiatry

## 2021-02-07 ENCOUNTER — Other Ambulatory Visit: Payer: Self-pay

## 2021-02-07 DIAGNOSIS — G5782 Other specified mononeuropathies of left lower limb: Secondary | ICD-10-CM | POA: Diagnosis not present

## 2021-02-07 MED ORDER — OXYCODONE HCL 15 MG PO TABS: 15.0000 mg | ORAL_TABLET | Freq: Three times a day (TID) | ORAL | 0 refills | Status: DC | PRN

## 2021-02-07 NOTE — Progress Notes (Signed)
  Subjective:  Patient ID: [name], male    DOB: 1960-10-03,  MRN: [id number]  Chief Complaint  Patient presents with   Sural Neuritis    Follow up left sural neuritis pain. Pt states no change or improvement.    DOS: 06/15/20 Procedure: Decompression/excision of neuroma left leg  60 y.o. male presents with the above complaint. History confirmed with patient. States the pain is consistent and prevents him from sleeping and he would like to know what other options we can consider.  Objective:  Physical Exam: mild tenderness at posterior leg area of previous surgery with palpable mass; absent sensation lateral leg sural distribution. Incision: healed  Assessment:   1. Sural neuritis, left     Plan:  Patient was evaluated and treated and all questions answered.  Sural Neuritis/neuroma -Hold off injection today. -Order MRI to evaluate mass -Plan for further excision of neuroma if pain persists. -Pain med refilled. Awaiting pain management appt.   Return in about 3 weeks (around 02/28/2021) for MRI F/u.

## 2021-02-14 ENCOUNTER — Telehealth: Payer: Self-pay | Admitting: *Deleted

## 2021-02-14 MED ORDER — OXYCODONE HCL 15 MG PO TABS: 15.0000 mg | ORAL_TABLET | Freq: Three times a day (TID) | ORAL | 0 refills | Status: DC | PRN

## 2021-02-14 NOTE — Telephone Encounter (Signed)
Patient is calling for pain medicine refill,said that he will need before tomorrow.

## 2021-02-14 NOTE — Telephone Encounter (Signed)
Patient has notified.

## 2021-02-22 ENCOUNTER — Telehealth: Payer: Self-pay | Admitting: *Deleted

## 2021-02-22 NOTE — Telephone Encounter (Signed)
Patient calling to request pain medicine. Please advise.

## 2021-02-23 MED ORDER — OXYCODONE HCL 15 MG PO TABS: 15.0000 mg | ORAL_TABLET | Freq: Three times a day (TID) | ORAL | 0 refills | Status: DC | PRN

## 2021-02-23 NOTE — Telephone Encounter (Signed)
[facility] [place]    Patient calling to inquire about his pain medication not being called in.  oxyCODONE (ROXICODONE) 15 MG immediate release tablet

## 2021-03-01 ENCOUNTER — Telehealth: Payer: Self-pay | Admitting: *Deleted

## 2021-03-01 NOTE — Telephone Encounter (Signed)
Patient is calling for a refill of his pain medicine. Please advise.

## 2021-03-02 MED ORDER — OXYCODONE HCL 15 MG PO TABS: 15.0000 mg | ORAL_TABLET | Freq: Three times a day (TID) | ORAL | 0 refills | Status: DC | PRN

## 2021-03-03 ENCOUNTER — Other Ambulatory Visit: Payer: Self-pay

## 2021-03-03 ENCOUNTER — Ambulatory Visit
Admission: RE | Admit: 2021-03-03 | Discharge: 2021-03-03 | Disposition: A | Discharge status: Home / Self Care | Payer: Medicare Other | Source: Ambulatory Visit | Attending: Podiatry | Admitting: Podiatry

## 2021-03-03 DIAGNOSIS — G5782 Other specified mononeuropathies of left lower limb: Secondary | ICD-10-CM

## 2021-03-03 NOTE — Telephone Encounter (Signed)
Patient has been notified

## 2021-03-08 ENCOUNTER — Telehealth: Payer: Self-pay | Admitting: *Deleted

## 2021-03-08 NOTE — Telephone Encounter (Signed)
Please advise 

## 2021-03-08 NOTE — Telephone Encounter (Signed)
Patient calling for a pain medicine refill, also to get his MRI results. Please advise.

## 2021-03-09 MED ORDER — OXYCODONE HCL 15 MG PO TABS: 15.0000 mg | ORAL_TABLET | Freq: Three times a day (TID) | ORAL | 0 refills | Status: DC | PRN

## 2021-03-09 NOTE — Telephone Encounter (Signed)
Please schedule, patient has been notified that medication has been filled.

## 2021-03-14 ENCOUNTER — Ambulatory Visit (INDEPENDENT_AMBULATORY_CARE_PROVIDER_SITE_OTHER): Payer: Medicare Other | Admitting: Podiatry

## 2021-03-14 ENCOUNTER — Encounter: Payer: Self-pay | Admitting: Podiatry

## 2021-03-14 ENCOUNTER — Other Ambulatory Visit: Payer: Self-pay

## 2021-03-14 DIAGNOSIS — M792 Neuralgia and neuritis, unspecified: Secondary | ICD-10-CM

## 2021-03-14 DIAGNOSIS — G5782 Other specified mononeuropathies of left lower limb: Secondary | ICD-10-CM | POA: Diagnosis not present

## 2021-03-14 DIAGNOSIS — M79605 Pain in left leg: Secondary | ICD-10-CM | POA: Diagnosis not present

## 2021-03-14 DIAGNOSIS — G8929 Other chronic pain: Secondary | ICD-10-CM

## 2021-03-14 NOTE — Progress Notes (Signed)
°  Subjective:  Patient ID: [name], male    DOB: 1960/10/06,  MRN: [id number]  Chief Complaint  Patient presents with   Results    MRI   "My foot is the same, still hurts all the time"   DOS: 06/15/20 Procedure: Decompression/excision of neuroma left leg  60 y.o. male presents with the above complaint. History confirmed with patient. Pain unchanged, had the MRI performed and here for review and further discussion. Objective:  Physical Exam: mild tenderness at posterior leg area of previous surgery with palpable mass; absent sensation lateral leg sural distribution. Incision: healed  CLINICAL DATA:  Left lower extremity soft tissue mass.   EXAM: MRI OF LOWER LEFT EXTREMITY WITHOUT CONTRAST   TECHNIQUE: Multiplanar, multisequence MR imaging of the left lower leg was performed. No intravenous contrast was administered.   COMPARISON:  None.   FINDINGS: Bones/Joint/Cartilage   No fracture or dislocation. Normal alignment. No joint effusion. No marrow signal abnormality.   Muscles and Tendons   Flexor, peroneal and extensor compartment tendons are intact. Muscles are normal.   Soft tissue No fluid collection or hematoma. No soft tissue mass. Postsurgical changes in the subcutaneous fat along the posterior aspect of the left lower leg overlying the gastrocnemius musculature. Just superior to the surgical site there is a 4 x 7 mm soft tissue nodule superficial to the left Achilles tendon at the musculotendinous junction.   IMPRESSION: 1. Postsurgical changes in the subcutaneous fat along the posterior aspect of the left lower leg overlying the gastrocnemius musculature. Just superior to the surgical site there is a 4 x 7 mm soft tissue nodule superficial to the left Achilles tendon at the musculotendinous junction. The mass is of indeterminate etiology and on likely to be characterize any further by imaging. Tissue diagnosis recommended.     Electronically  Signed   By: Kathreen Devoid M.D.   On: 03/05/2021 08:09  Assessment:   1. Sural neuritis, left   2. Chronic pain of left lower extremity   3. Neuralgia      Plan:  Patient was evaluated and treated and all questions answered.  Sural Neuritis/neuroma -MRI reviewed. Mass at posterior calf continuing to cause pain. Given tinel's sign likely neuroma. Discussed proceeding with excision. We discussed that he may experience permanent numbness but we are aiming to reduce his pain. Patient verbalized understanding.  -Patient has failed conservative therapy and wishes to proceed with surgical intervention. All risks, benefits, and alternatives discussed with patient. No guarantees given. Consent reviewed and signed by patient. -Planned procedures: excision of neuroma leg left -ASA 3 - Patient with moderate systemic disease with functional limitations; Risk factors:       No follow-ups on file.

## 2021-03-16 ENCOUNTER — Telehealth: Payer: Self-pay | Admitting: *Deleted

## 2021-03-16 MED ORDER — OXYCODONE HCL 15 MG PO TABS: 15.0000 mg | ORAL_TABLET | Freq: Three times a day (TID) | ORAL | 0 refills | Status: DC | PRN

## 2021-03-16 NOTE — Telephone Encounter (Signed)
Patient has been notified

## 2021-03-16 NOTE — Telephone Encounter (Signed)
Patient is calling to request pain medicine, please advise.

## 2021-03-22 ENCOUNTER — Telehealth: Payer: Self-pay

## 2021-03-22 MED ORDER — OXYCODONE HCL 15 MG PO TABS: 15.0000 mg | ORAL_TABLET | Freq: Three times a day (TID) | ORAL | 0 refills | Status: DC | PRN

## 2021-03-22 NOTE — Telephone Encounter (Signed)
Patient called for a refill on pain meds. Please advise thanks

## 2021-03-22 NOTE — Addendum Note (Signed)
Addended by: Hardie Pulley on: 03/22/2021 02:00 PM   Modules accepted: Orders

## 2021-03-29 ENCOUNTER — Telehealth: Payer: Self-pay | Admitting: *Deleted

## 2021-03-29 ENCOUNTER — Other Ambulatory Visit: Payer: Self-pay | Admitting: Podiatry

## 2021-03-29 MED ORDER — OXYCODONE HCL 15 MG PO TABS: 15.0000 mg | ORAL_TABLET | Freq: Three times a day (TID) | ORAL | 0 refills | Status: DC | PRN

## 2021-03-29 NOTE — Telephone Encounter (Signed)
Patient is calling for a refill on his pain medicine. Please advise.

## 2021-03-30 NOTE — Telephone Encounter (Signed)
Yes I referred him a while ago - he's been  on the wait list for months and was supposed to see them in February. I just checked and he is now scheduled for May. I will have to talk to him about a different option.

## 2021-04-04 MED ORDER — OXYCODONE HCL 15 MG PO TABS: 15.0000 mg | ORAL_TABLET | Freq: Three times a day (TID) | ORAL | 0 refills | Status: DC | PRN

## 2021-04-04 NOTE — Telephone Encounter (Signed)
[facility] [address]   oxyCODONE (ROXICODONE) 15 MG immediate release tablet  Patient called requesting medication refill .

## 2021-04-11 ENCOUNTER — Telehealth: Payer: Self-pay | Admitting: *Deleted

## 2021-04-11 MED ORDER — OXYCODONE HCL 15 MG PO TABS: 15.0000 mg | ORAL_TABLET | Freq: Three times a day (TID) | ORAL | 0 refills | Status: DC | PRN

## 2021-04-11 NOTE — Telephone Encounter (Signed)
Patient is calling to request a refill of his pain medicine. Please advise. 

## 2021-04-11 NOTE — Telephone Encounter (Signed)
Patient notified

## 2021-04-18 ENCOUNTER — Telehealth: Payer: Self-pay | Admitting: *Deleted

## 2021-04-18 MED ORDER — OXYCODONE HCL 15 MG PO TABS: 15.0000 mg | ORAL_TABLET | Freq: Three times a day (TID) | ORAL | 0 refills | Status: DC | PRN

## 2021-04-18 NOTE — Telephone Encounter (Signed)
Patient is calling to request a pain medicine refill, please advise.

## 2021-04-18 NOTE — Telephone Encounter (Signed)
Patient called again to get pain medication refill .

## 2021-04-25 ENCOUNTER — Telehealth: Payer: Self-pay | Admitting: *Deleted

## 2021-04-25 MED ORDER — OXYCODONE HCL 15 MG PO TABS: 15.0000 mg | ORAL_TABLET | Freq: Three times a day (TID) | ORAL | 0 refills | Status: DC | PRN

## 2021-04-25 NOTE — Telephone Encounter (Signed)
Patient is calling for a refill of his pain medicine. Please advise.

## 2021-04-25 NOTE — Telephone Encounter (Signed)
Patient has been notified

## 2021-05-02 ENCOUNTER — Encounter: Payer: Medicare Other | Admitting: Podiatry

## 2021-05-02 ENCOUNTER — Telehealth: Payer: Self-pay | Admitting: *Deleted

## 2021-05-02 ENCOUNTER — Ambulatory Visit: Payer: Medicare Other | Admitting: Physical Medicine and Rehabilitation

## 2021-05-02 MED ORDER — OXYCODONE HCL 15 MG PO TABS: 15.0000 mg | ORAL_TABLET | Freq: Three times a day (TID) | ORAL | 0 refills | Status: DC | PRN

## 2021-05-02 NOTE — Telephone Encounter (Signed)
Patient calling for a pain medicine refill. Please advise.

## 2021-05-05 NOTE — Telephone Encounter (Signed)
Meds were sent 2/7 can you make sure he knows

## 2021-05-08 ENCOUNTER — Telehealth: Payer: Self-pay | Admitting: *Deleted

## 2021-05-08 MED ORDER — OXYCODONE HCL 15 MG PO TABS: 15.0000 mg | ORAL_TABLET | Freq: Three times a day (TID) | ORAL | 0 refills | Status: DC | PRN

## 2021-05-08 NOTE — Telephone Encounter (Signed)
Notified patient.

## 2021-05-08 NOTE — Telephone Encounter (Signed)
Patient is calling to request a pain medicine refill. Please advise.

## 2021-05-15 ENCOUNTER — Other Ambulatory Visit: Payer: Self-pay | Admitting: Podiatry

## 2021-05-15 ENCOUNTER — Telehealth: Payer: Self-pay | Admitting: Podiatry

## 2021-05-15 ENCOUNTER — Telehealth: Payer: Self-pay | Admitting: *Deleted

## 2021-05-15 DIAGNOSIS — G8929 Other chronic pain: Secondary | ICD-10-CM

## 2021-05-15 MED ORDER — OXYCODONE HCL 15 MG PO TABS: 15.0000 mg | ORAL_TABLET | Freq: Three times a day (TID) | ORAL | 0 refills | Status: DC | PRN

## 2021-05-15 NOTE — Telephone Encounter (Signed)
I cannot send in any pain medication. Will need to come in to be seen

## 2021-05-15 NOTE — Telephone Encounter (Signed)
Patient is calling for a refill of his pain medicine.Please advise.(Oxycodone-15 mg)

## 2021-05-15 NOTE — Telephone Encounter (Signed)
Faxed referral to [facility] for an upcoming appointment for patient and did inform patient that pain medicine has been sent to pharmacy on file.

## 2021-05-15 NOTE — Telephone Encounter (Signed)
Patient called and stated that he would like a refill on pain medication.   Please advise

## 2021-05-16 ENCOUNTER — Encounter: Payer: Medicare Other | Admitting: Podiatry

## 2021-05-17 ENCOUNTER — Other Ambulatory Visit: Payer: Self-pay

## 2021-05-17 ENCOUNTER — Ambulatory Visit (INDEPENDENT_AMBULATORY_CARE_PROVIDER_SITE_OTHER): Payer: Medicare Other | Admitting: Podiatry

## 2021-05-17 ENCOUNTER — Encounter: Payer: Self-pay | Admitting: Podiatry

## 2021-05-17 DIAGNOSIS — M79605 Pain in left leg: Secondary | ICD-10-CM | POA: Diagnosis not present

## 2021-05-17 DIAGNOSIS — G8929 Other chronic pain: Secondary | ICD-10-CM

## 2021-05-17 DIAGNOSIS — M792 Neuralgia and neuritis, unspecified: Secondary | ICD-10-CM

## 2021-05-17 DIAGNOSIS — G5782 Other specified mononeuropathies of left lower limb: Secondary | ICD-10-CM

## 2021-05-17 MED ORDER — OXYCODONE HCL 15 MG PO TABS: 15.0000 mg | ORAL_TABLET | Freq: Three times a day (TID) | ORAL | 0 refills | Status: AC | PRN

## 2021-05-17 NOTE — Progress Notes (Signed)
Subjective:  Patient ID: [name], male    DOB: [birth year],   MRN: [id number]  Chief Complaint  Patient presents with   Foot Pain    Surgery consuklt     61 y.o. male presents for surgery consultation and continue pain and numbness in left leg. Patient relates this has been going on for 4-5 years and started out with plantar fascitis and surgery on his left foot subsequently had gastrocnemius recession with Dr. March Rummage and nerve was injured. Has since had several surgeries to try and decompress nerve without success. Patient is frustrated and in a lot of pain. He has been on continue pain medication for several months.  . Denies any other pedal complaints. Denies n/v/f/c.   Past Medical History:  Diagnosis Date   At risk for sleep apnea    10-14-2019  ---   STOP--BANG SCORE = 5   (screening routed  to pt's pcp in epic)   Cardiomyopathy ([place]) 2009   per cardiac cath 12-10-2007 ef 35-40%;    last echo 04-27-2015 in ef 55-60%   COPD (chronic obstructive pulmonary disease) ([place])    previously seen pulmonologist-- dr Melvyn Novas--- GOLD 0-- hx multiple exacerbation's last one 04-23-2019 w/ ARF hypoxia (10-14-2019  per pt followed by pcp;  pt was on symbicort bid and duoneb and rescue inhaler as needed  ,  pt stated has productive smoker's cough but no sob or difficulty breathing;  last used nebulizer one week ago and not used rescue inhaler in along time)   DDD (degenerative disc disease), cervical    ALL OVER   History of nuclear stress test    epic 04-08-2015   low risk w/ small inferior wall scar of mild severity focus of reversible ischemia, ef 61%   Hypertension    followed by pcp   Neuroma of left leg    with neuritis   Plantar fasciitis, left     Objective:  Physical Exam: Vascular: DP/PT pulses 2/4 bilateral. CFT <3 seconds. Normal hair growth on digits. No edema.  Skin. No lacerations or abrasions bilateral feet.  Musculoskeletal: MMT 5/5 bilateral lower extremities in DF,  PF, Inversion and Eversion. Deceased ROM in DF of ankle joint. Tender to posterior left leg in area of previous incision.  Neurological: Sensation intact to light touch medially. Decreased sensation over lateral leg and foot. Posiitve tinel sign over mass on left leg.   Assessment:   1. Sural neuritis, left   2. Neuralgia   3. Chronic pain of left lower extremity      Plan:  Patient was evaluated and treated and all questions answered. Discuss sural neuritis of left leg and pain with patient. Discussed treatment options including repeats surgery to remove neruoma in leg vs pain management and follow-up with neurologist.  Patient relates he would like to try surgery another time. Discussed the risks and benefits and discussed there is not guarantee that this will relief his pain and that it will leave him with numbness. Patient expressed understanding.  Patient has been understandable frustrated with his pain.  Discussed I will prescribe pain medication to get him to surgery. Did discuss that I will put in a referral to pain management as if surgery is not successful will have continued pain needs and will need to be following with them. Also discussed possible referral to neruology in the future if continue pain.  -refill of oxycodone provided.  -Informed surgical risk consent was reviewed and read aloud to the patient.  I reviewed the films.  I have discussed my findings with the patient in great detail.  I have discussed all risks including but not limited to infection, stiffness, scarring, limp, disability, deformity, damage to blood vessels and nerves, numbness, poor healing, need for braces, arthritis, chronic pain, amputation, death.  All benefits and realistic expectations discussed in great detail.  I have made no promises as to the outcome.  I have provided realistic expectations.  I have offered the patient a 2nd opinion, which they have declined and assured me they preferred to proceed  despite the risks. Discussed neruoma/ soft tissue mass resection procedure with patient. Will tentatively plan for 3/14.   Lorenda Peck, DPM

## 2021-05-18 ENCOUNTER — Telehealth: Payer: Self-pay

## 2021-05-18 NOTE — Telephone Encounter (Signed)
I called [name] this morning to let him know that he needs to call pain management to make an appointment with them. Unfortunately the call didn't go through , will try again around lunch time.

## 2021-05-19 ENCOUNTER — Telehealth: Payer: Self-pay | Admitting: *Deleted

## 2021-05-19 NOTE — Telephone Encounter (Signed)
Please advise 

## 2021-05-19 NOTE — Telephone Encounter (Addendum)
Patient is calling for a refill of his pain medicine,is completely out. Please advise.  I have faxed a referral to [facility] and informed the patient that someone should be calling him to schedule an appointment.

## 2021-05-19 NOTE — Telephone Encounter (Signed)
Dr. Blenda Mounts please advise if you would like for the patient to receive a refill please. Thank you

## 2021-05-19 NOTE — Telephone Encounter (Signed)
I sent in a refill to his pharmacy two days ago. And thank you

## 2021-05-22 ENCOUNTER — Telehealth: Payer: Self-pay | Admitting: *Deleted

## 2021-05-22 ENCOUNTER — Telehealth: Payer: Self-pay | Admitting: Podiatry

## 2021-05-22 NOTE — Telephone Encounter (Signed)
Pt called very upset that he called Friday for a pain medication refilll but no one ever called him back or sent in a refill. He stated he has a very ruff weekend with pain and is not going thru that another day. Upon checking the chart pt had a refill sent in on 2.22 for pain medication.  He said he never picked it up and would call the pharmacy to confirm. I apologized and told him to call us if there was an issue.

## 2021-05-22 NOTE — Telephone Encounter (Signed)
Faxed on (05/15/21) and called the [facility], they will contact the patient.notified the patient as well.

## 2021-05-30 ENCOUNTER — Telehealth: Payer: Self-pay | Admitting: *Deleted

## 2021-05-30 ENCOUNTER — Other Ambulatory Visit: Payer: Self-pay | Admitting: Podiatry

## 2021-05-30 ENCOUNTER — Encounter: Payer: Medicare Other | Admitting: Podiatry

## 2021-05-30 MED ORDER — OXYCODONE HCL 15 MG PO TABS: 15.0000 mg | ORAL_TABLET | ORAL | 0 refills | Status: AC | PRN

## 2021-05-30 NOTE — Telephone Encounter (Signed)
Notified patient that his medication has been sent to pharmacy , asked if [facility] has called and he said that they did, needed his insurance cards before scheduling but did not have them, we don't seem to have a copy either. He said that he will call  Pcp to see if he can get a copy from them and send to them , misplaced his cards. ?

## 2021-05-30 NOTE — Telephone Encounter (Signed)
Patient is calling for a pain medicine refill , almost out.  ?Called [facility], they have not scheduled an appointment yet, left a message for the new patient coordinator to call back regarding this.Please advise. ?

## 2021-05-30 NOTE — Telephone Encounter (Signed)
I will send in refill for time being. Hopefully we can get him scheduled soon  ?

## 2021-05-31 ENCOUNTER — Telehealth: Payer: Self-pay | Admitting: Podiatry

## 2021-05-31 NOTE — Telephone Encounter (Signed)
Ok he will have to work on finding his insurance card and getting an appointment because we cannot keep prescribing pain meds indefinitely. I can until surgery and for a short period after that but after that any pain meds will need to be through them. Thanks  ?

## 2021-05-31 NOTE — Telephone Encounter (Signed)
Received call from [place] @ [facility] returning the call about the referral. Katie said she called pt 2 times and finally got him and pt stated he did not have his insurance card and declined the appt for pain management. ?

## 2021-06-01 NOTE — Telephone Encounter (Signed)
Called patient, no answer , left message per Dr Blenda Mounts.

## 2021-06-02 ENCOUNTER — Encounter: Payer: Self-pay | Admitting: Physical Medicine and Rehabilitation

## 2021-06-06 ENCOUNTER — Encounter: Payer: Self-pay | Admitting: Podiatry

## 2021-06-06 ENCOUNTER — Other Ambulatory Visit: Payer: Self-pay | Admitting: Podiatry

## 2021-06-06 DIAGNOSIS — G5782 Other specified mononeuropathies of left lower limb: Secondary | ICD-10-CM | POA: Diagnosis not present

## 2021-06-06 MED ORDER — OXYCODONE HCL 15 MG PO TABS: 15.0000 mg | ORAL_TABLET | ORAL | 0 refills | Status: DC | PRN

## 2021-06-13 ENCOUNTER — Other Ambulatory Visit: Payer: Self-pay

## 2021-06-13 ENCOUNTER — Encounter: Payer: Medicare Other | Admitting: Podiatry

## 2021-06-13 ENCOUNTER — Ambulatory Visit (INDEPENDENT_AMBULATORY_CARE_PROVIDER_SITE_OTHER): Payer: Medicare Other | Admitting: Podiatry

## 2021-06-13 DIAGNOSIS — Z9889 Other specified postprocedural states: Secondary | ICD-10-CM

## 2021-06-13 MED ORDER — OXYCODONE HCL 15 MG PO TABS: 15.0000 mg | ORAL_TABLET | ORAL | 0 refills | Status: AC | PRN

## 2021-06-13 MED ORDER — GABAPENTIN 300 MG PO CAPS: 300.0000 mg | ORAL_CAPSULE | Freq: Two times a day (BID) | ORAL | 3 refills | Status: AC

## 2021-06-13 NOTE — Progress Notes (Signed)
?Subjective:  ?Patient ID: [name], male    DOB: Apr 27, 1960,  MRN: [id number] ? ?Chief Complaint  ?Patient presents with  ? Post-op Problem  ?  POV #1 DOS 06/06/2021 EXCISION OF MASS LEFT LEG. Pt complains of soreness and numbness in all left toes.   ? ? ?DOS: 06/06/21 ?Procedure: Left sural neruoma excision ? ?61 y.o. male returns for POV#1. Patient relates he is still having numbness and burning and tingling but it is improved a little bit. Requesting more pain medication.  ? ?Review of Systems: Negative except as noted in the HPI. Denies N/V/F/Ch. ? ?Past Medical History:  ?Diagnosis Date  ? At risk for sleep apnea   ? 10-14-2019  ---   STOP--BANG SCORE = 5   (screening routed  to pt's pcp in epic)  ? Cardiomyopathy [facility]) 2009  ? per cardiac cath 12-10-2007 ef 35-40%;    last echo 04-27-2015 in ef 55-60%  ? COPD (chronic obstructive pulmonary disease) ([place])   ? previously seen pulmonologist-- dr wert--- GOLD 0-- hx multiple exacerbation's last one 04-23-2019 w/ ARF hypoxia (10-14-2019  per pt followed by pcp;  pt was on symbicort bid and duoneb and rescue inhaler as needed  ,  pt stated has productive smoker's cough but no sob or difficulty breathing;  last used nebulizer one week ago and not used rescue inhaler in along time)  ? DDD (degenerative disc disease), cervical   ? ALL OVER  ? History of nuclear stress test   ? epic 04-08-2015   low risk w/ small inferior wall scar of mild severity focus of reversible ischemia, ef 61%  ? Hypertension   ? followed by pcp  ? Neuroma of left leg   ? with neuritis  ? Plantar fasciitis, left   ? ? ?Current Outpatient Medications:  ?  gabapentin (NEURONTIN) 300 MG capsule, Take 1 capsule (300 mg total) by mouth 2 (two) times daily., Disp: 90 capsule, Rfl: 3 ?  albuterol (VENTOLIN HFA) 108 (90 Base) MCG/ACT inhaler, Inhale 2 puffs into the lungs every 4 (four) hours as needed for wheezing or shortness of breath., Disp: 18 g, Rfl: 2 ?  amLODipine (NORVASC) 10 MG  tablet, Take 1 tablet (10 mg total) by mouth daily., Disp: 30 tablet, Rfl: 0 ?  ipratropium-albuterol (DUONEB) 0.5-2.5 (3) MG/3ML SOLN, Take 3 mLs by nebulization 3 (three) times daily. (Patient taking differently: Take 3 mLs by nebulization every 4 (four) hours as needed (shortness of breath).), Disp: 360 mL, Rfl: 1 ?  isosorbide mononitrate (IMDUR) 30 MG 24 hr tablet, Take 1 tablet (30 mg total) by mouth daily., Disp: 30 tablet, Rfl: 0 ?  nebivolol (BYSTOLIC) 2.5 MG tablet, Take 1 tablet (2.5 mg total) by mouth daily., Disp: 30 tablet, Rfl: 0 ?  nicotine (NICODERM CQ - DOSED IN MG/24 HOURS) 14 mg/24hr patch, Place 1 patch (14 mg total) onto the skin daily., Disp: 28 patch, Rfl: 0 ?  oxyCODONE (ROXICODONE) 15 MG immediate release tablet, Take 1 tablet (15 mg total) by mouth every 4 (four) hours as needed for up to 7 days for pain., Disp: 42 tablet, Rfl: 0 ?  sildenafil (VIAGRA) 100 MG tablet, Take 100 mg by mouth as needed for erectile dysfunction., Disp: , Rfl:  ?  SYMBICORT 160-4.5 MCG/ACT inhaler, Inhale 2 puffs into the lungs 2 (two) times daily., Disp: , Rfl:  ? ?Social History  ? ?Tobacco Use  ?Smoking Status Every Day  ? Packs/day: 0.75  ? Years: 28.00  ?  Pack years: 21.00  ? Types: Cigarettes  ?Smokeless Tobacco Never  ? ? ?No Known Allergies ?Objective:  ?There were no vitals filed for this visit. ?There is no height or weight on file to calculate BMI. ?Constitutional Well developed. ?Well nourished.  ?Vascular Foot warm and well perfused. ?Capillary refill normal to all digits.   ?Neurologic Normal speech. ?Oriented to person, place, and time. ?Epicritic sensation to light touch grossly present bilaterally.  ?Dermatologic Skin healing well without signs of infection. Skin edges well coapted without signs of infection.  ?Orthopedic: Tenderness to palpation noted about the surgical site.  ? ? ?Assessment:  ? ?1. Post-operative state   ? ?Plan:  ?Patient was evaluated and treated and all questions  answered. ? ?S/p foot surgery left ?-Progressing as expected post-operatively. ?-WB Status: WABT in CAM boot ?-Sutures: intact. ?-Medications: Oxycodone refill and gabapentin 300 mg BID ?-Leg redressed. ?Follow-up in 2 weeks for suture removal.  ? ?No follow-ups on file.  ? ?

## 2021-06-14 ENCOUNTER — Encounter: Payer: Self-pay | Admitting: Podiatry

## 2021-06-22 ENCOUNTER — Other Ambulatory Visit: Payer: Self-pay | Admitting: Podiatry

## 2021-06-22 ENCOUNTER — Telehealth: Payer: Self-pay | Admitting: *Deleted

## 2021-06-22 MED ORDER — OXYCODONE HCL 15 MG PO TABS: 15.0000 mg | ORAL_TABLET | Freq: Four times a day (QID) | ORAL | 0 refills | Status: AC | PRN

## 2021-06-22 NOTE — Telephone Encounter (Signed)
Patient is calling to request pain medicine refill. He said that he has an appointment with pain management on tomorrow.Please advise. ?

## 2021-06-22 NOTE — Telephone Encounter (Signed)
I will send in a few day today and then pain management will take over with the pain medication. Thanks  ?

## 2021-06-22 NOTE — Telephone Encounter (Signed)
Patient has been notified, verbalized understanding.

## 2021-06-26 ENCOUNTER — Telehealth: Payer: Self-pay | Admitting: *Deleted

## 2021-06-26 NOTE — Telephone Encounter (Signed)
Pt missed a call from the office and I advised him that the message about his pain medication had been sent to Dr Blenda Mounts. Pt also confirmed appt for tomorrow.

## 2021-06-26 NOTE — Telephone Encounter (Signed)
I will give him a call

## 2021-06-26 NOTE — Telephone Encounter (Signed)
Pt called again asking about his pain medication refill. I explained that Dr Blenda Mounts is in clinic still but I would resend a message again. ? ?

## 2021-06-26 NOTE — Telephone Encounter (Signed)
Patient called Dr. Blenda Mounts back...Marland Kitchenhe is by his phone  ?

## 2021-06-26 NOTE — Telephone Encounter (Signed)
Patient is calling because he has some concerns about the appointment he had with the pain management doctor he saw on Friday. ?He cannot write him a prescription for pain for 2 weeks and he is out of his pain medicine. He would like to speak with doctor to discuss.Please advise. ?

## 2021-06-27 ENCOUNTER — Ambulatory Visit (INDEPENDENT_AMBULATORY_CARE_PROVIDER_SITE_OTHER): Payer: Medicare Other | Admitting: Podiatry

## 2021-06-27 ENCOUNTER — Encounter: Payer: Self-pay | Admitting: Podiatry

## 2021-06-27 DIAGNOSIS — G5782 Other specified mononeuropathies of left lower limb: Secondary | ICD-10-CM

## 2021-06-27 DIAGNOSIS — M792 Neuralgia and neuritis, unspecified: Secondary | ICD-10-CM

## 2021-06-27 DIAGNOSIS — Z9889 Other specified postprocedural states: Secondary | ICD-10-CM

## 2021-06-27 MED ORDER — OXYCODONE-ACETAMINOPHEN 10-325 MG PO TABS: 1.0000 | ORAL_TABLET | Freq: Three times a day (TID) | ORAL | 0 refills | Status: DC | PRN

## 2021-06-27 NOTE — Progress Notes (Signed)
?Subjective:  ?Patient ID: [name], male    DOB: 1960/04/29,  MRN: [id number] ? ?Chief Complaint  ?Patient presents with  ? Routine Post Op  ?  POV #2 DOS 06/06/2021 EXCISION OF MASS LEFT LEG  ? ? ?DOS: 06/06/21 ?Procedure: Left sural neruoma excision ? ?61 y.o. male returns for POV#1. Patient relates he is still having numbness and burning and tingling but it is improved a little bit. Has seen pain management but has to wait two weeks for any prescriptions opiods.  ? ?Review of Systems: Negative except as noted in the HPI. Denies N/V/F/Ch. ? ?Past Medical History:  ?Diagnosis Date  ? At risk for sleep apnea   ? 10-14-2019  ---   STOP--BANG SCORE = 5   (screening routed  to pt's pcp in epic)  ? Cardiomyopathy [facility]) 2009  ? per cardiac cath 12-10-2007 ef 35-40%;    last echo 04-27-2015 in ef 55-60%  ? COPD (chronic obstructive pulmonary disease) ([place])   ? previously seen pulmonologist-- dr wert--- GOLD 0-- hx multiple exacerbation's last one 04-23-2019 w/ ARF hypoxia (10-14-2019  per pt followed by pcp;  pt was on symbicort bid and duoneb and rescue inhaler as needed  ,  pt stated has productive smoker's cough but no sob or difficulty breathing;  last used nebulizer one week ago and not used rescue inhaler in along time)  ? DDD (degenerative disc disease), cervical   ? ALL OVER  ? History of nuclear stress test   ? epic 04-08-2015   low risk w/ small inferior wall scar of mild severity focus of reversible ischemia, ef 61%  ? Hypertension   ? followed by pcp  ? Neuroma of left leg   ? with neuritis  ? Plantar fasciitis, left   ? ? ?Current Outpatient Medications:  ?  oxyCODONE-acetaminophen (PERCOCET) 10-325 MG tablet, Take 1 tablet by mouth every 8 (eight) hours as needed for up to 7 days for pain., Disp: 21 tablet, Rfl: 0 ?  albuterol (VENTOLIN HFA) 108 (90 Base) MCG/ACT inhaler, Inhale 2 puffs into the lungs every 4 (four) hours as needed for wheezing or shortness of breath., Disp: 18 g, Rfl: 2 ?   amLODipine (NORVASC) 10 MG tablet, Take 1 tablet (10 mg total) by mouth daily., Disp: 30 tablet, Rfl: 0 ?  gabapentin (NEURONTIN) 300 MG capsule, Take 1 capsule (300 mg total) by mouth 2 (two) times daily., Disp: 90 capsule, Rfl: 3 ?  ipratropium-albuterol (DUONEB) 0.5-2.5 (3) MG/3ML SOLN, Take 3 mLs by nebulization 3 (three) times daily. (Patient taking differently: Take 3 mLs by nebulization every 4 (four) hours as needed (shortness of breath).), Disp: 360 mL, Rfl: 1 ?  isosorbide mononitrate (IMDUR) 30 MG 24 hr tablet, Take 1 tablet (30 mg total) by mouth daily., Disp: 30 tablet, Rfl: 0 ?  nebivolol (BYSTOLIC) 2.5 MG tablet, Take 1 tablet (2.5 mg total) by mouth daily., Disp: 30 tablet, Rfl: 0 ?  nicotine (NICODERM CQ - DOSED IN MG/24 HOURS) 14 mg/24hr patch, Place 1 patch (14 mg total) onto the skin daily., Disp: 28 patch, Rfl: 0 ?  sildenafil (VIAGRA) 100 MG tablet, Take 100 mg by mouth as needed for erectile dysfunction., Disp: , Rfl:  ?  SYMBICORT 160-4.5 MCG/ACT inhaler, Inhale 2 puffs into the lungs 2 (two) times daily., Disp: , Rfl:  ? ?Social History  ? ?Tobacco Use  ?Smoking Status Every Day  ? Packs/day: 0.75  ? Years: 28.00  ? Pack years: 21.00  ?  Types: Cigarettes  ?Smokeless Tobacco Never  ? ? ?No Known Allergies ?Objective:  ?There were no vitals filed for this visit. ?There is no height or weight on file to calculate BMI. ?Constitutional Well developed. ?Well nourished.  ?Vascular Foot warm and well perfused. ?Capillary refill normal to all digits.   ?Neurologic Normal speech. ?Oriented to person, place, and time. ?Epicritic sensation to light touch grossly present bilaterally.  ?Dermatologic Skin healing well without signs of infection. Skin edges well coapted without signs of infection.  ?Orthopedic: Tenderness to palpation noted about the surgical site.  ? ? ?Assessment:  ? ?1. Post-operative state   ?2. Neuralgia   ?3. Sural neuritis, left   ? ? ?Plan:  ?Patient was evaluated and treated and  all questions answered. ? ?S/p foot surgery left ?-Progressing as expected post-operatively. ?-WB Status: WBAT  ?-Sutures:removed without incident.  ?-Medications: Oxycodone 10-325 refill (discussed will refill until pain management can start prescribing) and gabapentin 600 mg BID. Discussed trying capsaicin.  ?-Referral to neurology for possible nerve stimulator.  ?-Leg redressed. ?Follow-up in 3 weeks .  ? ?No follow-ups on file.  ? ?

## 2021-07-04 ENCOUNTER — Telehealth: Payer: Self-pay | Admitting: *Deleted

## 2021-07-04 ENCOUNTER — Other Ambulatory Visit: Payer: Self-pay | Admitting: Podiatry

## 2021-07-04 MED ORDER — OXYCODONE-ACETAMINOPHEN 10-325 MG PO TABS: 1.0000 | ORAL_TABLET | Freq: Three times a day (TID) | ORAL | 0 refills | Status: AC | PRN

## 2021-07-04 NOTE — Telephone Encounter (Signed)
Refill sent please let him know. Thanks

## 2021-07-04 NOTE — Telephone Encounter (Signed)
Patient is calling and said  that the pain pills (percocet,not lasting long enough and gabapentin)are not working but still requesting a refill of the pain medicine, has taken the last one. Please advise. ?

## 2021-07-04 NOTE — Telephone Encounter (Signed)
Patient notified

## 2021-07-27 ENCOUNTER — Ambulatory Visit: Payer: Medicare Other | Admitting: Physical Medicine and Rehabilitation

## 2022-07-30 DIAGNOSIS — I10 Essential (primary) hypertension: Secondary | ICD-10-CM | POA: Diagnosis not present

## 2022-07-30 DIAGNOSIS — F17210 Nicotine dependence, cigarettes, uncomplicated: Secondary | ICD-10-CM | POA: Diagnosis not present

## 2022-07-30 DIAGNOSIS — J449 Chronic obstructive pulmonary disease, unspecified: Secondary | ICD-10-CM | POA: Diagnosis not present

## 2022-08-01 DIAGNOSIS — Z79891 Long term (current) use of opiate analgesic: Secondary | ICD-10-CM | POA: Diagnosis not present

## 2022-08-01 DIAGNOSIS — G5772 Causalgia of left lower limb: Secondary | ICD-10-CM | POA: Diagnosis not present

## 2022-08-01 DIAGNOSIS — G8928 Other chronic postprocedural pain: Secondary | ICD-10-CM | POA: Diagnosis not present

## 2022-08-01 DIAGNOSIS — M722 Plantar fascial fibromatosis: Secondary | ICD-10-CM | POA: Diagnosis not present

## 2022-08-14 DIAGNOSIS — M79672 Pain in left foot: Secondary | ICD-10-CM | POA: Diagnosis not present

## 2022-08-14 DIAGNOSIS — G8929 Other chronic pain: Secondary | ICD-10-CM | POA: Diagnosis not present

## 2022-08-14 DIAGNOSIS — M79662 Pain in left lower leg: Secondary | ICD-10-CM | POA: Diagnosis not present

## 2022-08-28 DIAGNOSIS — G8929 Other chronic pain: Secondary | ICD-10-CM | POA: Diagnosis not present

## 2022-08-28 DIAGNOSIS — M79672 Pain in left foot: Secondary | ICD-10-CM | POA: Diagnosis not present

## 2022-08-28 DIAGNOSIS — M79662 Pain in left lower leg: Secondary | ICD-10-CM | POA: Diagnosis not present

## 2022-08-29 DIAGNOSIS — M722 Plantar fascial fibromatosis: Secondary | ICD-10-CM | POA: Diagnosis not present

## 2022-08-29 DIAGNOSIS — G5772 Causalgia of left lower limb: Secondary | ICD-10-CM | POA: Diagnosis not present

## 2022-08-29 DIAGNOSIS — G8928 Other chronic postprocedural pain: Secondary | ICD-10-CM | POA: Diagnosis not present

## 2022-08-29 DIAGNOSIS — Z79891 Long term (current) use of opiate analgesic: Secondary | ICD-10-CM | POA: Diagnosis not present

## 2022-10-10 DIAGNOSIS — M722 Plantar fascial fibromatosis: Secondary | ICD-10-CM | POA: Diagnosis not present

## 2022-10-10 DIAGNOSIS — G8928 Other chronic postprocedural pain: Secondary | ICD-10-CM | POA: Diagnosis not present

## 2022-10-10 DIAGNOSIS — Z79891 Long term (current) use of opiate analgesic: Secondary | ICD-10-CM | POA: Diagnosis not present

## 2022-10-10 DIAGNOSIS — G5772 Causalgia of left lower limb: Secondary | ICD-10-CM | POA: Diagnosis not present

## 2022-11-07 DIAGNOSIS — G8928 Other chronic postprocedural pain: Secondary | ICD-10-CM | POA: Diagnosis not present

## 2022-11-07 DIAGNOSIS — M722 Plantar fascial fibromatosis: Secondary | ICD-10-CM | POA: Diagnosis not present

## 2022-11-07 DIAGNOSIS — G5772 Causalgia of left lower limb: Secondary | ICD-10-CM | POA: Diagnosis not present

## 2022-11-07 DIAGNOSIS — Z79891 Long term (current) use of opiate analgesic: Secondary | ICD-10-CM | POA: Diagnosis not present

## 2022-11-16 DIAGNOSIS — Z13820 Encounter for screening for osteoporosis: Secondary | ICD-10-CM | POA: Diagnosis not present

## 2022-11-16 DIAGNOSIS — Z0000 Encounter for general adult medical examination without abnormal findings: Secondary | ICD-10-CM | POA: Diagnosis not present

## 2022-11-16 DIAGNOSIS — Z136 Encounter for screening for cardiovascular disorders: Secondary | ICD-10-CM | POA: Diagnosis not present

## 2022-11-16 DIAGNOSIS — I10 Essential (primary) hypertension: Secondary | ICD-10-CM | POA: Diagnosis not present

## 2022-11-16 DIAGNOSIS — Z1331 Encounter for screening for depression: Secondary | ICD-10-CM | POA: Diagnosis not present

## 2022-11-16 DIAGNOSIS — R0609 Other forms of dyspnea: Secondary | ICD-10-CM | POA: Diagnosis not present

## 2022-11-16 DIAGNOSIS — Z7189 Other specified counseling: Secondary | ICD-10-CM | POA: Diagnosis not present

## 2022-11-16 DIAGNOSIS — Z1211 Encounter for screening for malignant neoplasm of colon: Secondary | ICD-10-CM | POA: Diagnosis not present

## 2022-11-16 DIAGNOSIS — R5383 Other fatigue: Secondary | ICD-10-CM | POA: Diagnosis not present

## 2022-11-19 ENCOUNTER — Other Ambulatory Visit: Payer: Self-pay | Admitting: Physician Assistant

## 2022-11-19 DIAGNOSIS — Z122 Encounter for screening for malignant neoplasm of respiratory organs: Secondary | ICD-10-CM

## 2022-11-22 ENCOUNTER — Encounter: Payer: Self-pay | Admitting: Physician Assistant

## 2022-12-05 DIAGNOSIS — G5772 Causalgia of left lower limb: Secondary | ICD-10-CM | POA: Diagnosis not present

## 2022-12-05 DIAGNOSIS — G8928 Other chronic postprocedural pain: Secondary | ICD-10-CM | POA: Diagnosis not present

## 2022-12-05 DIAGNOSIS — Z79891 Long term (current) use of opiate analgesic: Secondary | ICD-10-CM | POA: Diagnosis not present

## 2022-12-05 DIAGNOSIS — M722 Plantar fascial fibromatosis: Secondary | ICD-10-CM | POA: Diagnosis not present

## 2022-12-06 ENCOUNTER — Inpatient Hospital Stay: Admission: RE | Admit: 2022-12-06 | Payer: Medicare HMO | Source: Ambulatory Visit

## 2023-01-01 ENCOUNTER — Other Ambulatory Visit (HOSPITAL_BASED_OUTPATIENT_CLINIC_OR_DEPARTMENT_OTHER): Payer: Self-pay

## 2023-01-01 DIAGNOSIS — R0609 Other forms of dyspnea: Secondary | ICD-10-CM

## 2023-01-02 DIAGNOSIS — M722 Plantar fascial fibromatosis: Secondary | ICD-10-CM | POA: Diagnosis not present

## 2023-01-02 DIAGNOSIS — G8928 Other chronic postprocedural pain: Secondary | ICD-10-CM | POA: Diagnosis not present

## 2023-01-02 DIAGNOSIS — G5772 Causalgia of left lower limb: Secondary | ICD-10-CM | POA: Diagnosis not present

## 2023-01-02 DIAGNOSIS — Z79891 Long term (current) use of opiate analgesic: Secondary | ICD-10-CM | POA: Diagnosis not present

## 2023-01-22 DIAGNOSIS — G5781 Other specified mononeuropathies of right lower limb: Secondary | ICD-10-CM | POA: Diagnosis not present

## 2023-01-22 DIAGNOSIS — R200 Anesthesia of skin: Secondary | ICD-10-CM | POA: Diagnosis not present

## 2023-01-23 DIAGNOSIS — G5772 Causalgia of left lower limb: Secondary | ICD-10-CM | POA: Diagnosis not present

## 2023-02-04 DIAGNOSIS — G8928 Other chronic postprocedural pain: Secondary | ICD-10-CM | POA: Diagnosis not present

## 2023-02-04 DIAGNOSIS — G5772 Causalgia of left lower limb: Secondary | ICD-10-CM | POA: Diagnosis not present

## 2023-03-06 DIAGNOSIS — Z79891 Long term (current) use of opiate analgesic: Secondary | ICD-10-CM | POA: Diagnosis not present

## 2023-03-06 DIAGNOSIS — G5772 Causalgia of left lower limb: Secondary | ICD-10-CM | POA: Diagnosis not present

## 2023-03-06 DIAGNOSIS — G8928 Other chronic postprocedural pain: Secondary | ICD-10-CM | POA: Diagnosis not present

## 2023-04-04 ENCOUNTER — Other Ambulatory Visit: Payer: Self-pay | Admitting: Nurse Practitioner

## 2023-04-04 DIAGNOSIS — G894 Chronic pain syndrome: Secondary | ICD-10-CM

## 2023-04-27 ENCOUNTER — Inpatient Hospital Stay: Admission: RE | Admit: 2023-04-27 | Payer: Medicare HMO | Source: Ambulatory Visit

## 2023-04-27 ENCOUNTER — Other Ambulatory Visit: Payer: Medicare HMO

## 2023-07-04 ENCOUNTER — Ambulatory Visit
Admission: RE | Admit: 2023-07-04 | Discharge: 2023-07-04 | Disposition: A | Discharge status: Home / Self Care | Source: Ambulatory Visit | Attending: Nurse Practitioner | Admitting: Nurse Practitioner

## 2023-07-04 DIAGNOSIS — G894 Chronic pain syndrome: Secondary | ICD-10-CM

## 2023-10-21 ENCOUNTER — Other Ambulatory Visit: Payer: Self-pay | Admitting: Orthopedic Surgery

## 2023-11-01 NOTE — Progress Notes (Signed)
 Surgical Instructions   Your procedure is scheduled on Wednesday, August 13th, 2025. Report to [facility] Main Entrance A at 9:15 A.M., then check in with the Admitting office. Any questions or running late day of surgery: call [phone]  Questions prior to your surgery date: call [phone], Monday-Friday, 8am-4pm. If you experience any cold or flu symptoms such as cough, fever, chills, shortness of breath, etc. between now and your scheduled surgery, please notify us  at the above number.     Remember:  Do not eat after midnight the night before your surgery  You may drink clear liquids until 9:15 the morning of your surgery.   Clear liquids allowed are: Water, Non-Citrus Juices (without pulp), Carbonated Beverages, Clear Tea (no milk, honey, etc.), Black Coffee Only (NO MILK, CREAM OR POWDERED CREAMER of any kind), and Gatorade.  Patient Instructions  The night before surgery:  No food after midnight. ONLY clear liquids after midnight  The day of surgery (if you do NOT have diabetes):  Drink ONE (1) Pre-Surgery Clear Ensure by 9:15 the morning of surgery. Drink in one sitting. Do not sip.  This drink was given to you during your hospital  pre-op appointment visit.  Nothing else to drink after completing the  Pre-Surgery Clear Ensure.         If you have questions, please contact your surgeon's office.     Take these medicines the morning of surgery with A SIP OF WATER: Amlodipine  (Norvasc )   May take these medicines IF NEEDED: Albuterol  (Ventolin  HFA) inhaler - bring with you on day of surgery Oxycodone -acetaminophen  (Percocet) Symbicort  inhaler - bring with you on day of surgery    One week prior to surgery, STOP taking any Aspirin  (unless otherwise instructed by your surgeon) Aleve , Naproxen , Ibuprofen , Motrin , Advil , Goody's, BC's, all herbal medications, fish oil, and non-prescription vitamins.                     Do NOT Smoke (Tobacco/Vaping) for 24 hours  prior to your procedure.  If you use a CPAP at night, you may bring your mask/headgear for your overnight stay.   You will be asked to remove any contacts, glasses, piercing's, hearing aid's, dentures/partials prior to surgery. Please bring cases for these items if needed.    Patients discharged the day of surgery will not be allowed to drive home, and someone needs to stay with them for 24 hours.  SURGICAL WAITING ROOM VISITATION Patients may have no more than 2 support people in the waiting area - these visitors may rotate.   Pre-op nurse will coordinate an appropriate time for 1 ADULT support person, who may not rotate, to accompany patient in pre-op.  Children under the age of 69 must have an adult with them who is not the patient and must remain in the main waiting area with an adult.  If the patient needs to stay at the hospital during part of their recovery, the visitor guidelines for inpatient rooms apply.  Please refer to the [facility] website for the visitor guidelines for any additional information.   If you received a COVID test during your pre-op visit  it is requested that you wear a mask when out in public, stay away from anyone that may not be feeling well and notify your surgeon if you develop symptoms. If you have been in contact with anyone that has tested positive in the last 10 days please notify you surgeon.  Pre-operative 5 CHG Bathing Instructions   You can play a key role in reducing the risk of infection after surgery. Your skin needs to be as free of germs as possible. You can reduce the number of germs on your skin by washing with CHG (chlorhexidine gluconate) soap before surgery. CHG is an antiseptic soap that kills germs and continues to kill germs even after washing.   DO NOT use if you have an allergy to chlorhexidine/CHG or antibacterial soaps. If your skin becomes reddened or irritated, stop using the CHG and notify one of our RNs at [phone].    Please shower with the CHG soap starting 4 days before surgery using the following schedule:     Please keep in mind the following:  DO NOT shave, including legs and underarms, starting the day of your first shower.   You may shave your face at any point before/day of surgery.  Place clean sheets on your bed the day you start using CHG soap. Use a clean washcloth (not used since being washed) for each shower. DO NOT sleep with pets once you start using the CHG.   CHG Shower Instructions:  Wash your face and private area with normal soap. If you choose to wash your hair, wash first with your normal shampoo.  After you use shampoo/soap, rinse your hair and body thoroughly to remove shampoo/soap residue.  Turn the water OFF and apply about 3 tablespoons (45 ml) of CHG soap to a CLEAN washcloth.  Apply CHG soap ONLY FROM YOUR NECK DOWN TO YOUR TOES (washing for 3-5 minutes)  DO NOT use CHG soap on face, private areas, open wounds, or sores.  Pay special attention to the area where your surgery is being performed.  If you are having back surgery, having someone wash your back for you may be helpful. Wait 2 minutes after CHG soap is applied, then you may rinse off the CHG soap.  Pat dry with a clean towel  Put on clean clothes/pajamas   If you choose to wear lotion, please use ONLY the CHG-compatible lotions that are listed below.  Additional instructions for the day of surgery: DO NOT APPLY any lotions, deodorants, cologne, or perfumes.   Do not bring valuables to the hospital. [facility] is not responsible for any belongings/valuables. Do not wear nail polish, gel polish, artificial nails, or any other type of covering on natural nails (fingers and toes) Do not wear jewelry or makeup Put on clean/comfortable clothes.  Please brush your teeth.  Ask your nurse before applying any prescription medications to the skin.     CHG Compatible Lotions   Aveeno Moisturizing lotion   Cetaphil Moisturizing Cream  Cetaphil Moisturizing Lotion  Clairol Herbal Essence Moisturizing Lotion, Dry Skin  Clairol Herbal Essence Moisturizing Lotion, Extra Dry Skin  Clairol Herbal Essence Moisturizing Lotion, Normal Skin  Curel Age Defying Therapeutic Moisturizing Lotion with Alpha Hydroxy  Curel Extreme Care Body Lotion  Curel Soothing Hands Moisturizing Hand Lotion  Curel Therapeutic Moisturizing Cream, Fragrance-Free  Curel Therapeutic Moisturizing Lotion, Fragrance-Free  Curel Therapeutic Moisturizing Lotion, Original Formula  Eucerin Daily Replenishing Lotion  Eucerin Dry Skin Therapy Plus Alpha Hydroxy Crme  Eucerin Dry Skin Therapy Plus Alpha Hydroxy Lotion  Eucerin Original Crme  Eucerin Original Lotion  Eucerin Plus Crme Eucerin Plus Lotion  Eucerin TriLipid Replenishing Lotion  Keri Anti-Bacterial Hand Lotion  Keri Deep Conditioning Original Lotion Dry Skin Formula Softly Scented  Keri Deep Conditioning Original Lotion, Fragrance Free Sensitive  Skin Formula  Keri Lotion Fast Absorbing Fragrance Free Sensitive Skin Formula  Keri Lotion Fast Absorbing Softly Scented Dry Skin Formula  Keri Original Lotion  Keri Skin Renewal Lotion Keri Silky Smooth Lotion  Keri Silky Smooth Sensitive Skin Lotion  Nivea Body Creamy Conditioning Oil  Nivea Body Extra Enriched Lotion  Nivea Body Original Lotion  Nivea Body Sheer Moisturizing Lotion Nivea Crme  Nivea Skin Firming Lotion  NutraDerm 30 Skin Lotion  NutraDerm Skin Lotion  NutraDerm Therapeutic Skin Cream  NutraDerm Therapeutic Skin Lotion  ProShield Protective Hand Cream  Provon moisturizing lotion  Please read over the following fact sheets that you were given.

## 2023-11-04 ENCOUNTER — Other Ambulatory Visit (HOSPITAL_COMMUNITY)

## 2023-11-04 ENCOUNTER — Inpatient Hospital Stay (HOSPITAL_COMMUNITY)
Admission: RE | Admit: 2023-11-04 | Discharge: 2023-11-04 | Disposition: A | Discharge status: Home / Self Care | Source: Ambulatory Visit

## 2023-11-04 NOTE — Progress Notes (Signed)
 Pt did not show for PAT appt. Tried calling pt, no answer, LVM for pt to call me back

## 2023-11-05 ENCOUNTER — Encounter (HOSPITAL_COMMUNITY): Payer: Self-pay | Admitting: Orthopedic Surgery

## 2023-11-05 ENCOUNTER — Other Ambulatory Visit: Payer: Self-pay

## 2023-11-05 MED ORDER — CEFAZOLIN SODIUM-DEXTROSE 2-4 GM/100ML-% IV SOLN
2.0000 g | INTRAVENOUS | Status: AC
  Administered 2023-11-06 (×2): 2 g via INTRAVENOUS
  Filled 2023-11-05: qty 100

## 2023-11-05 NOTE — Progress Notes (Signed)
 SDW call  Patient was given pre-op instructions over the phone. Patient verbalized understanding of instructions provided.     PCP -  Patient can't remember Cardiologist - denies Pulmonary:    PPM/ICD - denies Device Orders - na Rep Notified - na   Chest x-ray - na EKG -  DOS, 11/06/2023 Stress Test -04/26/2015 ECHO - 08/30/2020 Cardiac Cath - 12/10/2007  Sleep Study/sleep apnea/CPAP: denies  Non-diabetic  Blood Thinner Instructions: denies Aspirin  Instructions:denies   ERAS Protcol - Clears until 0915   Anesthesia review: No   Patient denies shortness of breath, fever, cough and chest pain over the phone call  Your procedure is scheduled on Wednesday November 06, 2023  Report to [facility] Main Entrance A at  0915  A.M., then check in with the Admitting office.  Call this number if you have problems the morning of surgery:  [phone]   If you have any questions prior to your surgery date call [phone]: Open Monday-Friday 8am-4pm If you experience any cold or flu symptoms such as cough, fever, chills, shortness of breath, etc. between now and your scheduled surgery, please notify us  at the above number    Remember:  Do not eat after midnight the night before your surgery  You may drink clear liquids until  0915   the morning of your surgery.   Clear liquids allowed are: Water, Non-Citrus Juices (without pulp), Carbonated Beverages, Clear Tea, Black Coffee ONLY (NO MILK, CREAM OR POWDERED CREAMER of any kind), and Gatorade   Take these medicines the morning of surgery with A SIP OF WATER:  Amlodipine   As needed: Albuterol , Percocet, symbicort   As of today, STOP taking any Aspirin  (unless otherwise instructed by your surgeon) Aleve , Naproxen , Ibuprofen , Motrin , Advil , Goody's, BC's, all herbal medications, fish oil, and all vitamins.

## 2023-11-06 ENCOUNTER — Ambulatory Visit (HOSPITAL_BASED_OUTPATIENT_CLINIC_OR_DEPARTMENT_OTHER): Admitting: Certified Registered Nurse Anesthetist

## 2023-11-06 ENCOUNTER — Ambulatory Visit (HOSPITAL_COMMUNITY)
Admission: RE | Admit: 2023-11-06 | Discharge: 2023-11-06 | Disposition: A | Discharge status: Home / Self Care | Attending: Orthopedic Surgery | Admitting: Orthopedic Surgery

## 2023-11-06 ENCOUNTER — Encounter (HOSPITAL_COMMUNITY)
Admission: RE | Disposition: A | Discharge status: Home / Self Care | Payer: Self-pay | Source: Home / Self Care | Attending: Orthopedic Surgery

## 2023-11-06 ENCOUNTER — Other Ambulatory Visit: Payer: Self-pay

## 2023-11-06 ENCOUNTER — Ambulatory Visit (HOSPITAL_COMMUNITY): Admitting: Certified Registered Nurse Anesthetist

## 2023-11-06 ENCOUNTER — Ambulatory Visit (HOSPITAL_COMMUNITY)

## 2023-11-06 ENCOUNTER — Encounter (HOSPITAL_COMMUNITY): Payer: Self-pay | Admitting: Orthopedic Surgery

## 2023-11-06 DIAGNOSIS — Z7951 Long term (current) use of inhaled steroids: Secondary | ICD-10-CM | POA: Insufficient documentation

## 2023-11-06 DIAGNOSIS — G894 Chronic pain syndrome: Secondary | ICD-10-CM | POA: Diagnosis present

## 2023-11-06 DIAGNOSIS — G9050 Complex regional pain syndrome I, unspecified: Secondary | ICD-10-CM

## 2023-11-06 DIAGNOSIS — F17210 Nicotine dependence, cigarettes, uncomplicated: Secondary | ICD-10-CM | POA: Diagnosis not present

## 2023-11-06 DIAGNOSIS — J449 Chronic obstructive pulmonary disease, unspecified: Secondary | ICD-10-CM | POA: Diagnosis not present

## 2023-11-06 DIAGNOSIS — I10 Essential (primary) hypertension: Secondary | ICD-10-CM | POA: Diagnosis not present

## 2023-11-06 HISTORY — DX: Unspecified osteoarthritis, unspecified site: M19.90

## 2023-11-06 HISTORY — PX: SPINAL CORD STIMULATOR INSERTION: SHX5378

## 2023-11-06 LAB — POCT I-STAT, CHEM 8
BUN: 10 mg/dL (ref 8–23)
Calcium, Ion: 1.22 mmol/L (ref 1.15–1.40)
Chloride: 105 mmol/L (ref 98–111)
Creatinine, Ser: 0.9 mg/dL (ref 0.61–1.24)
Glucose, Bld: 95 mg/dL (ref 70–99)
HCT: 49 % (ref 39.0–52.0)
Hemoglobin: 16.7 g/dL (ref 13.0–17.0)
Potassium: 4.7 mmol/L (ref 3.5–5.1)
Sodium: 140 mmol/L (ref 135–145)
TCO2: 23 mmol/L (ref 22–32)

## 2023-11-06 LAB — SURGICAL PCR SCREEN
MRSA, PCR: NEGATIVE
Staphylococcus aureus: NEGATIVE

## 2023-11-06 LAB — CBC
HCT: 50.8 % (ref 39.0–52.0)
Hemoglobin: 16.7 g/dL (ref 13.0–17.0)
MCH: 28.9 pg (ref 26.0–34.0)
MCHC: 32.9 g/dL (ref 30.0–36.0)
MCV: 88 fL (ref 80.0–100.0)
Platelets: 236 K/uL (ref 150–400)
RBC: 5.77 MIL/uL (ref 4.22–5.81)
RDW: 14 % (ref 11.5–15.5)
WBC: 4.3 K/uL (ref 4.0–10.5)
nRBC: 0 % (ref 0.0–0.2)

## 2023-11-06 SURGERY — INSERTION, SPINAL CORD STIMULATOR, LUMBAR
Anesthesia: General

## 2023-11-06 MED ORDER — LIDOCAINE 2% (20 MG/ML) 5 ML SYRINGE
INTRAMUSCULAR | Status: AC
  Filled 2023-11-06: qty 5

## 2023-11-06 MED ORDER — ROCURONIUM BROMIDE 10 MG/ML (PF) SYRINGE
PREFILLED_SYRINGE | INTRAVENOUS | Status: DC | PRN
  Administered 2023-11-06 (×2): 20 mg via INTRAVENOUS
  Administered 2023-11-06 (×2): 70 mg via INTRAVENOUS

## 2023-11-06 MED ORDER — THROMBIN 20000 UNITS EX SOLR
CUTANEOUS | Status: AC
Start: 2023-11-06 — End: 2023-11-06
  Filled 2023-11-06: qty 20000

## 2023-11-06 MED ORDER — PHENYLEPHRINE 80 MCG/ML (10ML) SYRINGE FOR IV PUSH (FOR BLOOD PRESSURE SUPPORT)
PREFILLED_SYRINGE | INTRAVENOUS | Status: AC
  Filled 2023-11-06: qty 10

## 2023-11-06 MED ORDER — LIDOCAINE 2% (20 MG/ML) 5 ML SYRINGE
INTRAMUSCULAR | Status: DC | PRN
  Administered 2023-11-06 (×2): 100 mg via INTRAVENOUS

## 2023-11-06 MED ORDER — BUPIVACAINE-EPINEPHRINE (PF) 0.25% -1:200000 IJ SOLN
INTRAMUSCULAR | Status: AC
  Filled 2023-11-06: qty 30

## 2023-11-06 MED ORDER — DEXAMETHASONE SODIUM PHOSPHATE 10 MG/ML IJ SOLN
INTRAMUSCULAR | Status: AC
Start: 2023-11-06 — End: 2023-11-06
  Filled 2023-11-06: qty 1

## 2023-11-06 MED ORDER — CHLORHEXIDINE GLUCONATE 0.12 % MT SOLN
15.0000 mL | Freq: Once | OROMUCOSAL | Status: AC
  Administered 2023-11-06 (×2): 15 mL via OROMUCOSAL
  Filled 2023-11-06: qty 15

## 2023-11-06 MED ORDER — LACTATED RINGERS IV SOLN: INTRAVENOUS | Status: DC

## 2023-11-06 MED ORDER — METHOCARBAMOL 500 MG PO TABS: 500.0000 mg | ORAL_TABLET | Freq: Four times a day (QID) | ORAL | 2 refills | Status: AC

## 2023-11-06 MED ORDER — EPHEDRINE SULFATE-NACL 50-0.9 MG/10ML-% IV SOSY
PREFILLED_SYRINGE | INTRAVENOUS | Status: DC | PRN
  Administered 2023-11-06 (×2): 5 mg via INTRAVENOUS

## 2023-11-06 MED ORDER — BUPIVACAINE-EPINEPHRINE 0.25% -1:200000 IJ SOLN
INTRAMUSCULAR | Status: DC | PRN
  Administered 2023-11-06: 8 mL
  Administered 2023-11-06: 5 mL
  Administered 2023-11-06 (×2): 7 mL
  Administered 2023-11-06: 8 mL
  Administered 2023-11-06: 5 mL

## 2023-11-06 MED ORDER — BUPIVACAINE LIPOSOME 1.3 % IJ SUSP
INTRAMUSCULAR | Status: DC | PRN
Start: 2023-11-06 — End: 2023-11-06
  Administered 2023-11-06 (×2): 20 mL

## 2023-11-06 MED ORDER — EPHEDRINE 5 MG/ML INJ
INTRAVENOUS | Status: AC
  Filled 2023-11-06: qty 5

## 2023-11-06 MED ORDER — FENTANYL CITRATE (PF) 250 MCG/5ML IJ SOLN
INTRAMUSCULAR | Status: AC
  Filled 2023-11-06: qty 5

## 2023-11-06 MED ORDER — ACETAMINOPHEN 10 MG/ML IV SOLN: 1000.0000 mg | Freq: Once | INTRAVENOUS | Status: DC | PRN

## 2023-11-06 MED ORDER — HYDROMORPHONE HCL 1 MG/ML IJ SOLN
INTRAMUSCULAR | Status: AC
  Filled 2023-11-06: qty 1

## 2023-11-06 MED ORDER — DEXMEDETOMIDINE HCL IN NACL 80 MCG/20ML IV SOLN
INTRAVENOUS | Status: DC | PRN
  Administered 2023-11-06: 12 ug via INTRAVENOUS
  Administered 2023-11-06 (×2): 8 ug via INTRAVENOUS
  Administered 2023-11-06: 12 ug via INTRAVENOUS

## 2023-11-06 MED ORDER — ONDANSETRON HCL 4 MG/2ML IJ SOLN: 4.0000 mg | Freq: Once | INTRAMUSCULAR | Status: DC | PRN

## 2023-11-06 MED ORDER — 0.9 % SODIUM CHLORIDE (POUR BTL) OPTIME
TOPICAL | Status: DC | PRN
Start: 2023-11-06 — End: 2023-11-06
  Administered 2023-11-06 (×2): 1000 mL

## 2023-11-06 MED ORDER — DEXAMETHASONE SODIUM PHOSPHATE 10 MG/ML IJ SOLN
INTRAMUSCULAR | Status: DC | PRN
  Administered 2023-11-06 (×2): 10 mg via INTRAVENOUS

## 2023-11-06 MED ORDER — MIDAZOLAM HCL 2 MG/2ML IJ SOLN
INTRAMUSCULAR | Status: DC | PRN
  Administered 2023-11-06 (×2): 2 mg via INTRAVENOUS

## 2023-11-06 MED ORDER — OXYCODONE HCL 5 MG PO TABS: 5.0000 mg | ORAL_TABLET | Freq: Once | ORAL | Status: DC | PRN

## 2023-11-06 MED ORDER — THROMBIN 20000 UNITS EX SOLR
CUTANEOUS | Status: DC | PRN
  Administered 2023-11-06 (×2): 20 mL via TOPICAL

## 2023-11-06 MED ORDER — PROPOFOL 10 MG/ML IV BOLUS
INTRAVENOUS | Status: AC
  Filled 2023-11-06: qty 20

## 2023-11-06 MED ORDER — OXYCODONE HCL 5 MG/5ML PO SOLN: 5.0000 mg | Freq: Once | ORAL | Status: DC | PRN

## 2023-11-06 MED ORDER — BUPIVACAINE LIPOSOME 1.3 % IJ SUSP
INTRAMUSCULAR | Status: AC
  Filled 2023-11-06: qty 20

## 2023-11-06 MED ORDER — POVIDONE-IODINE 7.5 % EX SOLN: Freq: Once | CUTANEOUS | Status: DC

## 2023-11-06 MED ORDER — ONDANSETRON HCL 4 MG/2ML IJ SOLN
INTRAMUSCULAR | Status: AC
  Filled 2023-11-06: qty 2

## 2023-11-06 MED ORDER — ONDANSETRON HCL 4 MG/2ML IJ SOLN
INTRAMUSCULAR | Status: DC | PRN
  Administered 2023-11-06 (×2): 4 mg via INTRAVENOUS

## 2023-11-06 MED ORDER — ORAL CARE MOUTH RINSE: 15.0000 mL | Freq: Once | OROMUCOSAL | Status: AC

## 2023-11-06 MED ORDER — MIDAZOLAM HCL 2 MG/2ML IJ SOLN
INTRAMUSCULAR | Status: AC
Start: 2023-11-06 — End: 2023-11-06
  Filled 2023-11-06: qty 2

## 2023-11-06 MED ORDER — SUGAMMADEX SODIUM 200 MG/2ML IV SOLN
INTRAVENOUS | Status: DC | PRN
  Administered 2023-11-06 (×2): 300 mg via INTRAVENOUS

## 2023-11-06 MED ORDER — ROCURONIUM BROMIDE 10 MG/ML (PF) SYRINGE
PREFILLED_SYRINGE | INTRAVENOUS | Status: AC
  Filled 2023-11-06: qty 10

## 2023-11-06 MED ORDER — FENTANYL CITRATE (PF) 100 MCG/2ML IJ SOLN
INTRAMUSCULAR | Status: AC
Start: 2023-11-06 — End: 2023-11-06
  Filled 2023-11-06: qty 2

## 2023-11-06 MED ORDER — FENTANYL CITRATE (PF) 100 MCG/2ML IJ SOLN
25.0000 ug | INTRAMUSCULAR | Status: DC | PRN
  Administered 2023-11-06: 50 ug via INTRAVENOUS
  Administered 2023-11-06 (×3): 25 ug via INTRAVENOUS
  Administered 2023-11-06: 50 ug via INTRAVENOUS
  Administered 2023-11-06: 25 ug via INTRAVENOUS
  Administered 2023-11-06 (×2): 50 ug via INTRAVENOUS

## 2023-11-06 MED ORDER — FENTANYL CITRATE (PF) 100 MCG/2ML IJ SOLN
INTRAMUSCULAR | Status: AC
  Filled 2023-11-06: qty 2

## 2023-11-06 MED ORDER — HYDROMORPHONE HCL 1 MG/ML IJ SOLN
0.2500 mg | INTRAMUSCULAR | Status: DC | PRN
  Administered 2023-11-06 (×6): 0.5 mg via INTRAVENOUS

## 2023-11-06 MED ORDER — PROPOFOL 10 MG/ML IV BOLUS
INTRAVENOUS | Status: DC | PRN
  Administered 2023-11-06 (×2): 150 mg via INTRAVENOUS

## 2023-11-06 MED ORDER — FENTANYL CITRATE (PF) 250 MCG/5ML IJ SOLN
INTRAMUSCULAR | Status: DC | PRN
  Administered 2023-11-06: 50 ug via INTRAVENOUS
  Administered 2023-11-06: 100 ug via INTRAVENOUS
  Administered 2023-11-06 (×3): 50 ug via INTRAVENOUS
  Administered 2023-11-06: 100 ug via INTRAVENOUS
  Administered 2023-11-06 (×2): 50 ug via INTRAVENOUS

## 2023-11-06 SURGICAL SUPPLY — 51 items
BAG COUNTER SPONGE SURGICOUNT (BAG) ×1 IMPLANT
BAG ISOLATION DRAPE 18X18 (DRAPES) ×1 IMPLANT
BELT PT INTERSTIM MICRO SYSTEM (MISCELLANEOUS) IMPLANT
BENZOIN TINCTURE PRP APPL 2/3 (GAUZE/BANDAGES/DRESSINGS) ×1 IMPLANT
BUR MATCHSTICK NEURO 3.0 LAGG (BURR) IMPLANT
BUR ROUND PRECISION 4.0 (BURR) IMPLANT
CANISTER SUCTION 3000ML PPV (SUCTIONS) ×1 IMPLANT
CONTROLLER HANDSET COMM KIT (NEUROSURGERY SUPPLIES) IMPLANT
CORD BIPOLAR FORCEPS 12FT (ELECTRODE) ×1 IMPLANT
DRAPE C-ARM 42X72 X-RAY (DRAPES) ×1 IMPLANT
DRAPE POUCH INSTRU U-SHP 10X18 (DRAPES) ×1 IMPLANT
DRAPE SURG 17X23 STRL (DRAPES) ×3 IMPLANT
DURAPREP 26ML APPLICATOR (WOUND CARE) ×1 IMPLANT
ELECT CAUTERY BLADE 6.4 (BLADE) ×1 IMPLANT
ELECTRODE REM PT RTRN 9FT ADLT (ELECTROSURGICAL) ×1 IMPLANT
GAUZE 4X4 16PLY ~~LOC~~+RFID DBL (SPONGE) ×1 IMPLANT
GAUZE SPONGE 4X4 12PLY STRL (GAUZE/BANDAGES/DRESSINGS) ×1 IMPLANT
GLOVE BIO SURGEON STRL SZ 6.5 (GLOVE) ×1 IMPLANT
GLOVE BIO SURGEON STRL SZ8 (GLOVE) ×1 IMPLANT
GLOVE BIOGEL PI IND STRL 8 (GLOVE) ×1 IMPLANT
GLOVE SURG ENC MOIS LTX SZ6.5 (GLOVE) ×1 IMPLANT
GOWN STRL REUS W/ TWL LRG LVL3 (GOWN DISPOSABLE) ×2 IMPLANT
GOWN STRL REUS W/ TWL XL LVL3 (GOWN DISPOSABLE) ×1 IMPLANT
KIT BASIN OR (CUSTOM PROCEDURE TRAY) ×1 IMPLANT
KIT TURNOVER KIT B (KITS) ×1 IMPLANT
NDL HYPO 25GX1X1/2 BEV (NEEDLE) ×1 IMPLANT
NDL SUT 6 .5 CRC .975X.05 MAYO (NEEDLE) ×1 IMPLANT
NEEDLE HYPO 25GX1X1/2 BEV (NEEDLE) ×1 IMPLANT
NEUROSTIM INCEPTIV (Neuro Prosthesis/Implant) IMPLANT
NS IRRIG 1000ML POUR BTL (IV SOLUTION) ×1 IMPLANT
PACK LAMINECTOMY ORTHO (CUSTOM PROCEDURE TRAY) ×1 IMPLANT
PACK UNIVERSAL I (CUSTOM PROCEDURE TRAY) ×1 IMPLANT
PAD ARMBOARD POSITIONER FOAM (MISCELLANEOUS) ×2 IMPLANT
PROGRAMMER AND COMM CASE (NEUROSURGERY SUPPLIES) IMPLANT
RECHARGER SYSTEM (NEUROSURGERY SUPPLIES) IMPLANT
SPONGE T-LAP 4X18 ~~LOC~~+RFID (SPONGE) ×1 IMPLANT
STIMULATOR CORD SURESCAN MRI (Stimulator) IMPLANT
STRIP CLOSURE SKIN 1/2X4 (GAUZE/BANDAGES/DRESSINGS) ×1 IMPLANT
SUT MNCRL AB 3-0 PS2 18 (SUTURE) ×2 IMPLANT
SUT VIC AB 0 CT1 18XCR BRD 8 (SUTURE) ×1 IMPLANT
SUT VIC AB 1 CT1 18XCR BRD 8 (SUTURE) ×1 IMPLANT
SUT VIC AB 2-0 CT2 18 VCP726D (SUTURE) ×1 IMPLANT
SUTURE FIBERWR 2-0 18 17.9 3/8 (SUTURE) ×1 IMPLANT
SYR 30ML LL (SYRINGE) IMPLANT
SYR BULB IRRIG 60ML STRL (SYRINGE) ×1 IMPLANT
SYR CONTROL 10ML LL (SYRINGE) ×1 IMPLANT
TAPE CLOTH SURG 4X10 WHT LF (GAUZE/BANDAGES/DRESSINGS) IMPLANT
TOWEL GREEN STERILE (TOWEL DISPOSABLE) ×1 IMPLANT
TOWEL GREEN STERILE FF (TOWEL DISPOSABLE) ×1 IMPLANT
TRAY FOLEY MTR SLVR 16FR STAT (SET/KITS/TRAYS/PACK) IMPLANT
WATER STERILE IRR 1000ML POUR (IV SOLUTION) ×1 IMPLANT

## 2023-11-06 NOTE — Anesthesia Procedure Notes (Signed)
 Procedure Name: Intubation Date/Time: 11/06/2023 12:47 PM  Performed by: Jolynn Mage, CRNAPre-anesthesia Checklist: Patient identified, Patient being monitored, Timeout performed, Emergency Drugs available and Suction available Patient Re-evaluated:Patient Re-evaluated prior to induction Oxygen Delivery Method: Circle system utilized Preoxygenation: Pre-oxygenation with 100% oxygen Induction Type: IV induction Ventilation: Mask ventilation without difficulty Laryngoscope Size: Mac, Glidescope and 4 Grade View: Grade I Tube type: Oral Tube size: 7.5 mm Number of attempts: 1 Airway Equipment and Method: Rigid stylet and Video-laryngoscopy Placement Confirmation: ETT inserted through vocal cords under direct vision, positive ETCO2 and breath sounds checked- equal and bilateral Secured at: 22 cm Tube secured with: Tape Dental Injury: Teeth and Oropharynx as per pre-operative assessment  Difficulty Due To: Difficult Airway- due to large tongue and Difficult Airway- due to limited oral opening

## 2023-11-06 NOTE — Anesthesia Preprocedure Evaluation (Signed)
 Anesthesia Evaluation  Patient identified by MRN, date of birth, ID band Patient awake    Reviewed: Allergy & Precautions, NPO status , Patient's Chart, lab work & pertinent test results, reviewed documented beta blocker date and time   History of Anesthesia Complications Negative for: history of anesthetic complications  Airway Mallampati: III  TM Distance: >3 FB     Dental  (+) Upper Dentures   Pulmonary neg shortness of breath, neg sleep apnea, pneumonia, resolved, COPD, neg recent URI, Current Smoker and Patient abstained from smoking.   breath sounds clear to auscultation       Cardiovascular hypertension, (-) CAD, (-) Past MI, (-) Cardiac Stents and (-) CABG  Rhythm:Regular Rate:Normal  IMPRESSIONS     1. Left ventricular ejection fraction, by estimation, is 60 to 65%. The  left ventricle has normal function. The left ventricle has no regional  wall motion abnormalities. Left ventricular diastolic parameters were  normal.   2. Right ventricular systolic function is normal. The right ventricular  size is normal.   3. The mitral valve is normal in structure. No evidence of mitral valve  regurgitation. No evidence of mitral stenosis.   4. The aortic valve is normal in structure. Aortic valve regurgitation is  not visualized. No aortic stenosis is present.   5. The inferior vena cava is normal in size with greater than 50%  respiratory variability, suggesting right atrial pressure of 3 mmHg.   Comparison(s): Prior images unable to be directly viewed, comparison made  by report only.     Neuro/Psych  Headaches, neg Seizures  Neuromuscular disease (CRPS)    GI/Hepatic ,neg GERD  ,,(+) neg Cirrhosis        Endo/Other    Renal/GU Renal disease     Musculoskeletal  (+) Arthritis , Osteoarthritis,    Abdominal   Peds  Hematology   Anesthesia Other Findings   Reproductive/Obstetrics                               Anesthesia Physical Anesthesia Plan  ASA: 2  Anesthesia Plan: General   Post-op Pain Management:    Induction: Intravenous  PONV Risk Score and Plan: 2 and Ondansetron  and Dexamethasone   Airway Management Planned: Oral ETT  Additional Equipment:   Intra-op Plan:   Post-operative Plan: Extubation in OR  Informed Consent: I have reviewed the patients History and Physical, chart, labs and discussed the procedure including the risks, benefits and alternatives for the proposed anesthesia with the patient or authorized representative who has indicated his/her understanding and acceptance.     Dental advisory given  Plan Discussed with: CRNA  Anesthesia Plan Comments:         Anesthesia Quick Evaluation

## 2023-11-06 NOTE — Transfer of Care (Signed)
 Immediate Anesthesia Transfer of Care Note  Patient: Rino Ransier  Procedure(s) Performed: INSERTION, SPINAL CORD STIMULATOR, LUMBAR  Patient Location: PACU  Anesthesia Type:General  Level of Consciousness: awake, patient cooperative, and responds to stimulation  Airway & Oxygen Therapy: Patient Spontanous Breathing and Patient connected to face mask oxygen  Post-op Assessment: Report given to RN, Post -op Vital signs reviewed and stable, and Patient moving all extremities X 4  Post vital signs: Reviewed and stable  Last Vitals:  Vitals Value Taken Time  BP    Temp    Pulse 68 11/06/23 14:36  Resp 19 11/06/23 14:36  SpO2 100 % 11/06/23 14:36  Vitals shown include unfiled device data.  Last Pain:  Vitals:   11/06/23 0952  TempSrc:   PainSc: 8       Patients Stated Pain Goal: 5 (11/06/23 9047)  Complications: No notable events documented.

## 2023-11-06 NOTE — Op Note (Addendum)
 PATIENT NAME: [facility]    MEDICAL RECORD NO.:  [id number]    DATE OF BIRTH: [birth year]    DATE OF PROCEDURE: 11/06/2023                                  OPERATIVE REPORT     PREOPERATIVE DIAGNOSES: Chronic pain syndrome   POSTOPERATIVE DIAGNOSES: Chronic pain syndrome   PROCEDURE:    T12/L1 laminectomy for placement of spinal cord stimulator leads Placement of spinal cord stimulator generator   SURGEON:  Oneil Priestly, MD.   ASSISTANTBETHA Ileana Clara, PA-C.   ANESTHESIA:  General endotracheal anesthesia.   COMPLICATIONS:  None.   DISPOSITION:  Stable.   ESTIMATED BLOOD LOSS:  Minimal.   INDICATIONS FOR SURGERY:  Briefly, Mr. Klemann is a very pleasant 63- year-old male, who did present to me with ongoing pain in the left leg, for over 4 years.  This was felt to be due to complex regional pain syndrome.  The patient did undergo a spinal cord stimulator trial, and did report 70% alleviation of his left leg pain during the course of the trial.  He did wish to proceed with placement of a permanent spinal cord stimulator.  After a full discussion regarding the risks and benefits associated with surgery, he did elect to proceed.     OPERATIVE DETAILS:  On 11/06/2023 , the patient was brought to surgery and general endotracheal anesthesia was administered.  The patient was placed prone on a well-padded flat Jackson bed with a spinal frame. Antibiotics were given and the back was prepped and draped in the usual sterile fashion.  A time-out procedure was performed.  I then localized the T12/L1 intervertebral disc space.  An incision was made over the lower thoracic region, centered over this region.  A self-retaining retractor was placed.  I then used a high-speed bur in addition to a series of Kerrison punches, in order to perform a laminectomy.  The laminectomy was extended as laterally enough to accommodate the width of the paddle. Once the ligamentum flavum was  appropriately resected, and the dura was noted, I did develop a plane between the dorsal aspect of the dura and the lamina using a Woodson followed by a brain ribbon.  No resistance was encountered.  The leads were then advanced through the laminectomy site, and cephalad, to the region where the trial leads were placed that did result in appropriate pain relief.  These advanced very easily with no resistance.  The most cephalad aspect of the leads were at the superior aspect of the T10 vertebral body.  Satisfactory position was confirmed on AP and lateral fluoroscopic imaging.    At this point, the leads were tied to the paraspinal musculature using 3-0 silk ties.  I then made a transverse incision over the right flank.  I then tunneled across the subcutaneous layer, from the flank incision, to the incision over the lower thoracic region.  The leads were then passed through the tunneler, and delivered to the right flank wound.  The leads were then secured into the battery.  At this point, the battery and the leads were placed into the subcutaneous layer in the region of the right flank.  The wounds were then irrigated.  I was very pleased with the resting position of the paddle and the battery.  Final images were obtained confirming appropriate positioning of the paddle.   At this  point, the fascia in the thoracic region was closed using #1 Vicryl.  Both incisions were then closed using 2-0 Vicryl followed by  4-0 Monocryl.  Benzoin and Steri-Strips were applied, followed by a sterile dressing.  All instrument counts were correct at the termination of the procedure.   Of note, Ileana Clara, PA-C, was my assistant throughout surgery, and did aid in retraction, suctioning, and closure from start to finish.     Oneil Priestly, MD

## 2023-11-06 NOTE — H&P (Signed)
 PREOPERATIVE H&P  Chief Complaint: Left leg pain HPI: [name] is a 63 y.o. male who presents with ongoing pain in left leg, for over 4 years  The patient recently did undergo placement of a spinal cord stimulator trial.  He did report excellent alleviation of his pain during the course of the trial.  He did present to me for placement of a permanent spinal cord stimulator and battery.  Patient has failed multiple forms of conservative care and continues to have pain (see office notes for additional details regarding the patient's full course of treatment)  Past Medical History:  Diagnosis Date   Arthritis    At risk for sleep apnea    10-14-2019  ---   STOP--BANG SCORE = 5   (screening routed  to pt's pcp in epic)   Cardiomyopathy (HCC) 2009   per cardiac cath 12-10-2007 ef 35-40%;    last echo 04-27-2015 in ef 55-60%   COPD (chronic obstructive pulmonary disease) (HCC)    previously seen pulmonologist-- dr darlean--- GOLD 0-- hx multiple exacerbation's last one 04-23-2019 w/ ARF hypoxia (10-14-2019  per pt followed by pcp;  pt was on symbicort  bid and duoneb and rescue inhaler as needed  ,  pt stated has productive smoker's cough but no sob or difficulty breathing;  last used nebulizer one week ago and not used rescue inhaler in along time)   DDD (degenerative disc disease), cervical    ALL OVER   History of nuclear stress test    epic 04-08-2015   low risk w/ small inferior wall scar of mild severity focus of reversible ischemia, ef 61%   Hypertension    followed by pcp   Neuroma of left leg    with neuritis   Plantar fasciitis, left    Past Surgical History:  Procedure Laterality Date   ACHILLES TENDON REPAIR Left 2015   BICEPS TENDON REPAIR Right 2018   CARDIAC CATHETERIZATION  12-10-2007  dr al little   no evidence cad, mild to moderate LVD, ef 35-40%   GASTROC RECESSION EXTREMITY Left 10/22/2018   Procedure: GASTROC RECESSION EXTREMITY;  Surgeon: Gretel Ozell PARAS, DPM;  Location: [facility] [place];  Service: Podiatry;  Laterality: Left;   HEEL SPUR RESECTION Left 10/22/2018   Procedure: HEEL SPUR RESECTION;  Surgeon: Gretel Ozell PARAS, DPM;  Location[facility];  Service: Podiatry;  Laterality: Left;   HEEL SPUR RESECTION Left 10/21/2019   Procedure: HEEL SPUR RESECTION;  Surgeon: Gretel Ozell PARAS, DPM;  Location: [facility] [place];  Service: Podiatry;  Laterality: Left;   HIP SURGERY Left 2005 approx.   KNEE SURGERY Bilateral x7   last one 2010  approx.   NERVE REPAIR Left 07/01/2019   Procedure: Left leg repair/decompression of sural neuroma;  Surgeon: Gretel Ozell PARAS, DPM;  Location: [facility];  Service: Podiatry;  Laterality: Left;  PRONE   NERVE REPAIR Left 06/15/2020   Procedure: Decompression of sural neuroma LEFT CALF TRANSPOSITION INTO MUSCLE ;  Surgeon: Gretel Ozell PARAS, DPM;  Location: [facility] [place];  Service: Podiatry;  Laterality: Left;  PRONE   PLANTAR FASCIA RELEASE Left 10/22/2018   Procedure: ENDOSCOPIC PLANTAR FASCIOTOMY;  Surgeon: Gretel Ozell PARAS, DPM;  Location: [facility] [place];  Service: Podiatry;  Laterality: Left;   PLANTAR FASCIA RELEASE Left 10/21/2019   Procedure: ENDOSCOPIC PLANTAR FASCIA RELEASE; EXCISION SKIN LESION;  Surgeon: Gretel Ozell PARAS, DPM;  Location: [facility];  Service: Podiatry;  Laterality: Left;   Social History   Socioeconomic History   Marital status: Single    Spouse name: Not on file   Number of children: Not on file   Years of education: Not on file   Highest education level: Not on file  Occupational History   Not on file  Tobacco Use   Smoking status: Every Day    Current packs/day: 0.75    Average packs/day: 0.8 packs/day for 28.0 years (21.0 ttl pk-yrs)    Types: Cigarettes   Smokeless tobacco: Never  Vaping Use   Vaping status: Never Used  Substance and Sexual Activity   Alcohol use: Yes     Comment: occasional   Drug use: Never   Sexual activity: Not on file  Other Topics Concern   Not on file  Social History Narrative   Not on file   Social Drivers of Health   Financial Resource Strain: Not on file  Food Insecurity: Not on file  Transportation Needs: Not on file  Physical Activity: Not on file  Stress: Not on file  Social Connections: Unknown (08/06/2021)   Received from [facility]   Social Network    Social Network: Not on file   Family History  Problem Relation Age of Onset   CAD Father    Cancer Father    Bone cancer Mother    Colon cancer Neg Hx    Colon polyps Neg Hx    Esophageal cancer Neg Hx    Rectal cancer Neg Hx    Stomach cancer Neg Hx    No Known Allergies Prior to Admission medications   Medication Sig Start Date End Date Taking? Authorizing Provider  albuterol  (VENTOLIN  HFA) 108 (90 Base) MCG/ACT inhaler Inhale 2 puffs into the lungs every 4 (four) hours as needed for wheezing or shortness of breath. 04/25/19  Yes Samtani, Jai-Gurmukh, MD  amLODipine  (NORVASC ) 10 MG tablet Take 1 tablet (10 mg total) by mouth daily. 08/31/20 11/01/23 Yes Singh, Prashant K, MD  oxyCODONE -acetaminophen  (PERCOCET) 10-325 MG tablet Take 1 tablet by mouth 4 (four) times daily as needed for pain.   Yes [provider]  SYMBICORT  160-4.5 MCG/ACT inhaler Inhale 2 puffs into the lungs 2 (two) times daily as needed (shortness of breath). 12/25/20  Yes [provider]  gabapentin  (NEURONTIN ) 300 MG capsule Take 1 capsule (300 mg total) by mouth 2 (two) times daily. Patient not taking: Reported on 11/01/2023 06/13/21   Sikora, Rebecca, DPM  ipratropium-albuterol  (DUONEB) 0.5-2.5 (3) MG/3ML SOLN Take 3 mLs by nebulization 3 (three) times daily. Patient not taking: Reported on 11/01/2023 04/25/19   Samtani, Jai-Gurmukh, MD  isosorbide  mononitrate (IMDUR ) 30 MG 24 hr tablet Take 1 tablet (30 mg total) by mouth daily. Patient not taking: Reported on 11/01/2023 08/31/20    Singh, Prashant K, MD  nebivolol  (BYSTOLIC ) 2.5 MG tablet Take 1 tablet (2.5 mg total) by mouth daily. Patient not taking: Reported on 11/01/2023 08/31/20   Singh, Prashant K, MD  nicotine  (NICODERM CQ  - DOSED IN MG/24 HOURS) 14 mg/24hr patch Place 1 patch (14 mg total) onto the skin daily. Patient not taking: Reported on 11/01/2023 08/31/20   Singh, Prashant K, MD  sildenafil (VIAGRA) 100 MG tablet Take 100 mg by mouth as needed for erectile dysfunction.    [provider]     All other systems have been reviewed and were otherwise negative with the exception of those mentioned in the HPI and as  above.  Physical Exam: There were no vitals filed for this visit.  Body mass index is 32.1 kg/m.  General: Alert, no acute distress Cardiovascular: No pedal edema Respiratory: No cyanosis, no use of accessory musculature Skin: No lesions in the area of chief complaint Neurologic: Sensation intact distally Psychiatric: Patient is competent for consent with normal mood and affect Lymphatic: No axillary or cervical lymphadenopathy   Assessment/Plan: CHRONIC PAIN SYNDROME Plan for Procedure(s): INSERTION OF SPINAL CORD STIMULATOR AND BATTERY, THORACIC LAMINECTOMY   Oneil LITTIE Priestly, MD 11/06/2023 8:43 AM

## 2023-11-07 ENCOUNTER — Encounter (HOSPITAL_COMMUNITY): Payer: Self-pay | Admitting: Orthopedic Surgery

## 2023-11-07 NOTE — Anesthesia Postprocedure Evaluation (Signed)
 Anesthesia Post Note  Patient: [name]  Procedure(s) Performed: INSERTION, SPINAL CORD STIMULATOR, LUMBAR     Patient location during evaluation: PACU Anesthesia Type: General Level of consciousness: awake and alert Pain management: pain level controlled Vital Signs Assessment: post-procedure vital signs reviewed and stable Respiratory status: spontaneous breathing, nonlabored ventilation, respiratory function stable and patient connected to nasal cannula oxygen Cardiovascular status: blood pressure returned to baseline and stable Postop Assessment: no apparent nausea or vomiting Anesthetic complications: no   No notable events documented.  Last Vitals:  Vitals:   11/06/23 1545 11/06/23 1555  BP: (!) 132/94 125/83  Pulse: 73 71  Resp: 15 17  Temp:  36.7 C  SpO2: 97% 95%    Last Pain:  Vitals:   11/06/23 1555  TempSrc:   PainSc: 4                  Lynwood MARLA Cornea
